# Patient Record
Sex: Female | Born: 1935
Health system: Southern US, Community
[De-identification: ages and names within clinical notes are randomized; demographics above are authoritative.]

## PROBLEM LIST (undated history)

## (undated) DIAGNOSIS — I10 Essential (primary) hypertension: Secondary | ICD-10-CM

## (undated) DIAGNOSIS — K5900 Constipation, unspecified: Secondary | ICD-10-CM

## (undated) DIAGNOSIS — R519 Headache, unspecified: Secondary | ICD-10-CM

## (undated) DIAGNOSIS — I509 Heart failure, unspecified: Secondary | ICD-10-CM

## (undated) DIAGNOSIS — J45909 Unspecified asthma, uncomplicated: Secondary | ICD-10-CM

## (undated) DIAGNOSIS — G8929 Other chronic pain: Secondary | ICD-10-CM

## (undated) DIAGNOSIS — R51 Headache: Secondary | ICD-10-CM

## (undated) DIAGNOSIS — K649 Unspecified hemorrhoids: Secondary | ICD-10-CM

## (undated) DIAGNOSIS — M1990 Unspecified osteoarthritis, unspecified site: Secondary | ICD-10-CM

## (undated) DIAGNOSIS — E119 Type 2 diabetes mellitus without complications: Secondary | ICD-10-CM

## (undated) HISTORY — DX: Unspecified asthma, uncomplicated: J45.909

## (undated) HISTORY — PX: ABDOMINAL HYSTERECTOMY: SHX81

## (undated) HISTORY — PX: CHOLECYSTECTOMY: SHX55

## (undated) HISTORY — PX: BACK SURGERY: SHX140

## (undated) HISTORY — PX: KNEE SURGERY: SHX244

---

## 1998-12-11 ENCOUNTER — Encounter: Payer: Self-pay | Admitting: Orthopedic Surgery

## 1998-12-13 ENCOUNTER — Inpatient Hospital Stay (HOSPITAL_COMMUNITY): Admission: RE | Admit: 1998-12-13 | Discharge: 1998-12-15 | Payer: Self-pay | Admitting: Orthopedic Surgery

## 1998-12-15 ENCOUNTER — Inpatient Hospital Stay (HOSPITAL_COMMUNITY)
Admission: RE | Admit: 1998-12-15 | Discharge: 1999-01-01 | Payer: Self-pay | Admitting: Physical Medicine & Rehabilitation

## 1999-11-01 ENCOUNTER — Encounter: Admission: RE | Admit: 1999-11-01 | Discharge: 1999-11-01 | Payer: Self-pay | Admitting: Endocrinology

## 1999-11-01 ENCOUNTER — Encounter: Payer: Self-pay | Admitting: Endocrinology

## 2001-05-27 ENCOUNTER — Encounter: Payer: Self-pay | Admitting: Endocrinology

## 2001-05-27 ENCOUNTER — Encounter: Admission: RE | Admit: 2001-05-27 | Discharge: 2001-05-27 | Payer: Self-pay | Admitting: Endocrinology

## 2001-07-15 ENCOUNTER — Encounter: Payer: Self-pay | Admitting: Emergency Medicine

## 2001-07-15 ENCOUNTER — Emergency Department (HOSPITAL_COMMUNITY): Admission: EM | Admit: 2001-07-15 | Discharge: 2001-07-15 | Payer: Self-pay | Admitting: Emergency Medicine

## 2001-07-21 ENCOUNTER — Encounter (INDEPENDENT_AMBULATORY_CARE_PROVIDER_SITE_OTHER): Payer: Self-pay | Admitting: *Deleted

## 2001-07-21 ENCOUNTER — Inpatient Hospital Stay (HOSPITAL_COMMUNITY): Admission: EM | Admit: 2001-07-21 | Discharge: 2001-07-28 | Payer: Self-pay | Admitting: Emergency Medicine

## 2001-07-21 ENCOUNTER — Encounter: Payer: Self-pay | Admitting: Endocrinology

## 2001-07-21 ENCOUNTER — Encounter: Payer: Self-pay | Admitting: Emergency Medicine

## 2001-07-23 ENCOUNTER — Encounter: Payer: Self-pay | Admitting: General Surgery

## 2001-09-04 ENCOUNTER — Encounter: Payer: Self-pay | Admitting: Gastroenterology

## 2001-09-04 ENCOUNTER — Encounter (INDEPENDENT_AMBULATORY_CARE_PROVIDER_SITE_OTHER): Payer: Self-pay

## 2001-09-04 ENCOUNTER — Ambulatory Visit (HOSPITAL_COMMUNITY): Admission: RE | Admit: 2001-09-04 | Discharge: 2001-09-04 | Payer: Self-pay | Admitting: Gastroenterology

## 2001-09-10 ENCOUNTER — Ambulatory Visit (HOSPITAL_COMMUNITY): Admission: RE | Admit: 2001-09-10 | Discharge: 2001-09-10 | Payer: Self-pay | Admitting: Endocrinology

## 2001-09-10 ENCOUNTER — Encounter: Payer: Self-pay | Admitting: Endocrinology

## 2001-12-09 ENCOUNTER — Emergency Department (HOSPITAL_COMMUNITY): Admission: EM | Admit: 2001-12-09 | Discharge: 2001-12-09 | Payer: Self-pay | Admitting: Emergency Medicine

## 2001-12-09 ENCOUNTER — Encounter: Payer: Self-pay | Admitting: Emergency Medicine

## 2002-04-04 ENCOUNTER — Inpatient Hospital Stay (HOSPITAL_COMMUNITY): Admission: EM | Admit: 2002-04-04 | Discharge: 2002-04-06 | Payer: Self-pay | Admitting: *Deleted

## 2002-11-15 ENCOUNTER — Encounter: Payer: Self-pay | Admitting: Emergency Medicine

## 2002-11-15 ENCOUNTER — Emergency Department (HOSPITAL_COMMUNITY): Admission: EM | Admit: 2002-11-15 | Discharge: 2002-11-15 | Payer: Self-pay | Admitting: Emergency Medicine

## 2003-06-08 ENCOUNTER — Encounter: Admission: RE | Admit: 2003-06-08 | Discharge: 2003-06-08 | Payer: Self-pay | Admitting: Endocrinology

## 2004-07-04 ENCOUNTER — Encounter: Admission: RE | Admit: 2004-07-04 | Discharge: 2004-07-04 | Payer: Self-pay | Admitting: Endocrinology

## 2004-12-23 ENCOUNTER — Emergency Department (HOSPITAL_COMMUNITY): Admission: EM | Admit: 2004-12-23 | Discharge: 2004-12-23 | Payer: Self-pay | Admitting: Emergency Medicine

## 2005-06-19 ENCOUNTER — Inpatient Hospital Stay (HOSPITAL_COMMUNITY): Admission: EM | Admit: 2005-06-19 | Discharge: 2005-06-21 | Payer: Self-pay | Admitting: Emergency Medicine

## 2005-07-31 ENCOUNTER — Encounter: Admission: RE | Admit: 2005-07-31 | Discharge: 2005-07-31 | Payer: Self-pay | Admitting: Endocrinology

## 2005-08-14 ENCOUNTER — Encounter: Admission: RE | Admit: 2005-08-14 | Discharge: 2005-08-14 | Payer: Self-pay | Admitting: Endocrinology

## 2005-10-31 ENCOUNTER — Emergency Department (HOSPITAL_COMMUNITY): Admission: EM | Admit: 2005-10-31 | Discharge: 2005-10-31 | Payer: Self-pay | Admitting: Emergency Medicine

## 2006-01-01 ENCOUNTER — Emergency Department (HOSPITAL_COMMUNITY): Admission: EM | Admit: 2006-01-01 | Discharge: 2006-01-02 | Payer: Self-pay | Admitting: Emergency Medicine

## 2006-02-26 ENCOUNTER — Encounter: Admission: RE | Admit: 2006-02-26 | Discharge: 2006-02-26 | Payer: Self-pay | Admitting: Endocrinology

## 2006-03-14 ENCOUNTER — Emergency Department (HOSPITAL_COMMUNITY): Admission: EM | Admit: 2006-03-14 | Discharge: 2006-03-14 | Payer: Self-pay | Admitting: Emergency Medicine

## 2006-08-04 ENCOUNTER — Encounter: Admission: RE | Admit: 2006-08-04 | Discharge: 2006-08-04 | Payer: Self-pay | Admitting: Endocrinology

## 2006-11-02 ENCOUNTER — Emergency Department (HOSPITAL_COMMUNITY): Admission: EM | Admit: 2006-11-02 | Discharge: 2006-11-02 | Payer: Self-pay | Admitting: Emergency Medicine

## 2007-08-05 ENCOUNTER — Encounter: Admission: RE | Admit: 2007-08-05 | Discharge: 2007-08-05 | Payer: Self-pay | Admitting: Endocrinology

## 2007-10-20 ENCOUNTER — Encounter: Admission: RE | Admit: 2007-10-20 | Discharge: 2007-10-20 | Payer: Self-pay | Admitting: Orthopedic Surgery

## 2008-01-21 ENCOUNTER — Encounter: Admission: RE | Admit: 2008-01-21 | Discharge: 2008-01-21 | Payer: Self-pay | Admitting: Endocrinology

## 2009-01-11 ENCOUNTER — Encounter: Admission: RE | Admit: 2009-01-11 | Discharge: 2009-01-11 | Payer: Self-pay | Admitting: Endocrinology

## 2009-02-16 ENCOUNTER — Emergency Department (HOSPITAL_COMMUNITY): Admission: EM | Admit: 2009-02-16 | Discharge: 2009-02-16 | Payer: Self-pay | Admitting: Emergency Medicine

## 2010-01-19 ENCOUNTER — Encounter: Admission: RE | Admit: 2010-01-19 | Discharge: 2010-01-19 | Payer: Self-pay | Admitting: Endocrinology

## 2010-03-12 ENCOUNTER — Emergency Department (HOSPITAL_COMMUNITY): Admission: EM | Admit: 2010-03-12 | Discharge: 2010-03-12 | Payer: Self-pay | Admitting: Emergency Medicine

## 2010-03-14 ENCOUNTER — Ambulatory Visit: Payer: Self-pay | Admitting: Internal Medicine

## 2010-03-14 ENCOUNTER — Inpatient Hospital Stay (HOSPITAL_COMMUNITY)
Admission: EM | Admit: 2010-03-14 | Discharge: 2010-03-20 | Payer: Self-pay | Source: Home / Self Care | Attending: Internal Medicine | Admitting: Internal Medicine

## 2010-04-02 ENCOUNTER — Ambulatory Visit (HOSPITAL_COMMUNITY): Admission: RE | Admit: 2010-04-02 | Payer: Self-pay | Source: Home / Self Care | Admitting: Internal Medicine

## 2010-04-02 ENCOUNTER — Ambulatory Visit: Payer: Self-pay

## 2010-04-05 DIAGNOSIS — I10 Essential (primary) hypertension: Secondary | ICD-10-CM

## 2010-04-05 DIAGNOSIS — I5032 Chronic diastolic (congestive) heart failure: Secondary | ICD-10-CM

## 2010-04-12 ENCOUNTER — Ambulatory Visit: Payer: Self-pay | Admitting: Internal Medicine

## 2010-06-25 LAB — GLUCOSE, CAPILLARY
Glucose-Capillary: 100 mg/dL — ABNORMAL HIGH (ref 70–99)
Glucose-Capillary: 102 mg/dL — ABNORMAL HIGH (ref 70–99)
Glucose-Capillary: 119 mg/dL — ABNORMAL HIGH (ref 70–99)
Glucose-Capillary: 120 mg/dL — ABNORMAL HIGH (ref 70–99)
Glucose-Capillary: 124 mg/dL — ABNORMAL HIGH (ref 70–99)
Glucose-Capillary: 131 mg/dL — ABNORMAL HIGH (ref 70–99)
Glucose-Capillary: 138 mg/dL — ABNORMAL HIGH (ref 70–99)
Glucose-Capillary: 140 mg/dL — ABNORMAL HIGH (ref 70–99)
Glucose-Capillary: 145 mg/dL — ABNORMAL HIGH (ref 70–99)
Glucose-Capillary: 153 mg/dL — ABNORMAL HIGH (ref 70–99)

## 2010-06-25 LAB — BASIC METABOLIC PANEL
BUN: 10 mg/dL (ref 6–23)
BUN: 7 mg/dL (ref 6–23)
BUN: 8 mg/dL (ref 6–23)
CO2: 28 mEq/L (ref 19–32)
Chloride: 96 mEq/L (ref 96–112)
Creatinine, Ser: 0.74 mg/dL (ref 0.4–1.2)
GFR calc Af Amer: >60 mL/min (ref >60)
GFR calc non Af Amer: >60 mL/min (ref >60)
GFR calc non Af Amer: >60 mL/min (ref >60)
Glucose, Bld: 150 mg/dL — ABNORMAL HIGH (ref 70–99)
Potassium: 3.5 mEq/L (ref 3.5–5.1)
Potassium: 3.7 mEq/L (ref 3.5–5.1)
Sodium: 139 mEq/L (ref 135–145)

## 2010-06-25 LAB — CBC
HCT: 29.6 % — ABNORMAL LOW (ref 36.0–46.0)
HCT: 30.7 % — ABNORMAL LOW (ref 36.0–46.0)
MCHC: 32.2 g/dL (ref 30.0–36.0)
MCV: 88.2 fL (ref 78.0–100.0)
RDW: 14.7 % (ref 11.5–15.5)
RDW: 14.9 % (ref 11.5–15.5)
WBC: 7.6 10*3/uL (ref 4.0–10.5)
WBC: 9.7 10*3/uL (ref 4.0–10.5)

## 2010-06-26 LAB — URINE CULTURE
Colony Count: NO GROWTH
Culture  Setup Time: 201111302323
Culture: NO GROWTH

## 2010-06-26 LAB — BASIC METABOLIC PANEL
BUN: 10 mg/dL (ref 6–23)
BUN: 11 mg/dL (ref 6–23)
CO2: 26 mEq/L (ref 19–32)
Calcium: 9.4 mg/dL (ref 8.4–10.5)
Chloride: 102 mEq/L (ref 96–112)
Creatinine, Ser: 0.81 mg/dL (ref 0.4–1.2)
Creatinine, Ser: 0.86 mg/dL (ref 0.4–1.2)
GFR calc Af Amer: >60 mL/min (ref >60)
Glucose, Bld: 98 mg/dL (ref 70–99)

## 2010-06-26 LAB — CBC
HCT: 37.2 % (ref 36.0–46.0)
MCH: 28.1 pg (ref 26.0–34.0)
MCH: 28.4 pg (ref 26.0–34.0)
MCHC: 32 g/dL (ref 30.0–36.0)
MCV: 88.8 fL (ref 78.0–100.0)
MCV: 89 fL (ref 78.0–100.0)
Platelets: 183 10*3/uL (ref 150–400)
Platelets: 206 10*3/uL (ref 150–400)
RBC: 4.09 MIL/uL (ref 3.87–5.11)
RDW: 14.4 % (ref 11.5–15.5)
RDW: 14.6 % (ref 11.5–15.5)
WBC: 6.4 10*3/uL (ref 4.0–10.5)

## 2010-06-26 LAB — DIFFERENTIAL
Basophils Absolute: 0 10*3/uL (ref 0.0–0.1)
Basophils Absolute: 0 10*3/uL (ref 0.0–0.1)
Eosinophils Absolute: 0.3 10*3/uL (ref 0.0–0.7)
Eosinophils Relative: 4 % (ref 0–5)
Eosinophils Relative: 4 % (ref 0–5)
Lymphs Abs: 1.8 10*3/uL (ref 0.7–4.0)
Monocytes Absolute: 0.4 10*3/uL (ref 0.1–1.0)
Monocytes Absolute: 0.5 10*3/uL (ref 0.1–1.0)
Neutrophils Relative %: 61 % (ref 43–77)

## 2010-06-26 LAB — URINALYSIS, ROUTINE W REFLEX MICROSCOPIC
Bilirubin Urine: NEGATIVE
Bilirubin Urine: NEGATIVE
Glucose, UA: NEGATIVE mg/dL
Glucose, UA: NEGATIVE mg/dL
Hgb urine dipstick: NEGATIVE
Hgb urine dipstick: NEGATIVE
Ketones, ur: NEGATIVE mg/dL
Ketones, ur: NEGATIVE mg/dL
Protein, ur: NEGATIVE mg/dL
Protein, ur: NEGATIVE mg/dL
pH: 6 (ref 5.0–8.0)

## 2010-06-26 LAB — GLUCOSE, CAPILLARY: Glucose-Capillary: 108 mg/dL — ABNORMAL HIGH (ref 70–99)

## 2010-07-18 LAB — LIPASE, BLOOD: Lipase: 21 U/L (ref 11–59)

## 2010-07-18 LAB — COMPREHENSIVE METABOLIC PANEL
ALT: 11 U/L (ref 0–35)
Albumin: 3.4 g/dL — ABNORMAL LOW (ref 3.5–5.2)
Alkaline Phosphatase: 62 U/L (ref 39–117)
Calcium: 9.1 mg/dL (ref 8.4–10.5)
GFR calc Af Amer: >60 mL/min (ref >60)
Glucose, Bld: 142 mg/dL — ABNORMAL HIGH (ref 70–99)
Potassium: 4.3 mEq/L (ref 3.5–5.1)
Sodium: 136 mEq/L (ref 135–145)
Total Protein: 7 g/dL (ref 6.0–8.3)

## 2010-07-18 LAB — DIFFERENTIAL
Basophils Absolute: 0 K/uL (ref 0.0–0.1)
Basophils Relative: 0 % (ref 0–1)
Eosinophils Absolute: 0 K/uL (ref 0.0–0.7)
Eosinophils Relative: 0 % (ref 0–5)
Lymphocytes Relative: 9 % — ABNORMAL LOW (ref 12–46)
Lymphs Abs: 0.9 K/uL (ref 0.7–4.0)
Monocytes Absolute: 0.3 10*3/uL (ref 0.1–1.0)
Monocytes Relative: 3 % (ref 3–12)
Neutro Abs: 9.2 10*3/uL — ABNORMAL HIGH (ref 1.7–7.7)
Neutrophils Relative %: 88 % — ABNORMAL HIGH (ref 43–77)

## 2010-07-18 LAB — COMPREHENSIVE METABOLIC PANEL WITH GFR
AST: 19 U/L (ref 0–37)
BUN: 11 mg/dL (ref 6–23)
CO2: 26 meq/L (ref 19–32)
Chloride: 103 meq/L (ref 96–112)
Creatinine, Ser: 0.73 mg/dL (ref 0.4–1.2)
GFR calc non Af Amer: >60 mL/min (ref >60)
Total Bilirubin: 0.4 mg/dL (ref 0.3–1.2)

## 2010-07-18 LAB — CBC
HCT: 35.7 % — ABNORMAL LOW (ref 36.0–46.0)
Hemoglobin: 11.9 g/dL — ABNORMAL LOW (ref 12.0–15.0)
MCHC: 33.5 g/dL (ref 30.0–36.0)
MCV: 88.5 fL (ref 78.0–100.0)
Platelets: 146 10*3/uL — ABNORMAL LOW (ref 150–400)
RBC: 4.03 MIL/uL (ref 3.87–5.11)
RDW: 14.9 % (ref 11.5–15.5)
WBC: 10.4 K/uL (ref 4.0–10.5)

## 2010-07-18 LAB — HEMOCCULT GUIAC POC 1CARD (OFFICE): Fecal Occult Bld: POSITIVE

## 2010-07-18 LAB — POCT CARDIAC MARKERS
CKMB, poc: <1 ng/mL — ABNORMAL LOW (ref 1.0–8.0)
Myoglobin, poc: 98.1 ng/mL (ref 12–200)
Troponin i, poc: <0.05 ng/mL (ref 0.00–0.09)

## 2010-08-31 NOTE — H&P (Signed)
[facility]  Patient:    Angelica Benson, Angelica Benson Visit Number: [id number] MRN: [id number]          Service Type: EMS Location: [place] Attending Physician:  Devoria Albe Dictated by:   Veverly Fells. Altheimer, M.D. Admit Date:  07/15/2001 Discharge Date: 07/15/2001                           History and Physical  REASON FOR ADMISSION:  Acute pancreatitis.  HISTORY:  This is a 77 year old black female with history of severe obesity, type 2 diabetes and hypertension.  She presents with acute abdominal pain and findings consistent with pancreatitis and gallstone.  She had an episode of right lower chest pain with a pleuritic component six days ago, at which time she was seen in the [facility] Emergency Room and found to have right lower lobe infiltrate versus atelectasis on chest x-ray; she also had a slight cough.  She was treated as pneumonia with Zithromax.  Her pain quickly subsided.  She was seen in my office yesterday with no significant recurrent or residual symptoms.  At about 10:30 p.m. yesterday, she had sudden onset of severe epigastric pain radiating to the back associated with nausea and some vomiting.  She called EMS.  In the emergency room, she was found to have elevated amylase and lipase and liver enzymes and a gallstone by ultrasound, with common bile duct normal.  The pain was partly relieved with a GI cocktail and was further relieved with repeated eructation.  There was no further vomiting.  Her pain currently has resolved, although she continues to have a mild residual "soreness."  She has mild fatigue and notes decreased appetite for about two weeks.  She denies fever or chills.  She has not had any significant weight change.  She has not had any stool changes.  Last bowel movement was normal yesterday.  PAST MEDICAL HISTORY: 1. Diabetes mellitus, type 2, with severe obesity, diagnosed in 1992.  She    walks a half-mile most days.  Last  hemoglobin A1c was 6.0% on    June 04, 2001, indicating good control. 2. Mild background diabetic retinopathy, with last ophthalmology exam in    February 2003. 3. Hypertension. 4. Chronic pedal and pretibial edema. 5. History of abnormal EKG with ST depression on August 2001 EKG. 6. Euthyroid goiter. 7. Status post left total knee replacement, August of 2000. 8. Status post TAH/BSO in 1972 for uterine cancer.  DRUG SENSITIVITIES:  MORPHINE.  SUDAFED caused insomnia.  MEDICATIONS: 1. Glynase PresTabs 3 mg one-half tablet daily. 2. Avandia 8 mg daily. 3. Coated aspirin 81 mg daily. 4. Hydrochlorothiazide 25 mg b.i.d. 5. Extra-Strength Tylenol occasionally.  FAMILY HISTORY:  Father died of CHF.  Mother had diabetes and thyroid disease and died of stroke.  One brother died of cancer.  One brother with rheumatoid arthritis.  One other brother.  She has four sisters who are well.  She has had eight children, including one son who died of congenital heart disease.  SOCIAL HISTORY:  She is divorced and lives alone.  She does have some family in the area.  She is a retired Curator.  She has never smoke or consumed alcohol.  REVIEW OF SYSTEMS:  Appetite has been decreased, as above.  Energy level recently some decreased.  Skin is dry without other complaints.  EYES:  As above without complaints.  BREASTS:  No complaints.  CARDIOPULMONARY:  As above.  Denies chest pain or palpitations.  She does have some slight chronic dyspnea on exertion tendency.  GI:  As above; also occasional heartburn relieved with Tums.  No other abdominal pain or vomiting episodes.  No hematochezia or melena.  GENITOURINARY:  No complaints.  GYN:  Denies hot flashes.  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  NEUROLOGIC:  No complaints.  PHYSICAL EXAMINATION:  GENERAL:  Alert, pleasant, cooperative 77 year old black female in no acute distress.  VITAL SIGNS:  Temperature 97.1, blood  pressure 146/42, heart rate 92, respiratory rate 24.  SKIN:  Negative.  HEENT:  Eyes:  Normal externally with fundi not examined.  Oropharynx: Negative.  NECK:  Supple without thyromegaly, with carotid upstrokes normal without bruit.  LUNGS:  Unlabored with a few left basilar crackles.  HEART:  Regular without murmur.  ABDOMEN:  Severely obese, soft, mild subxiphoid tenderness, no mass.  EXTREMITIES:  Brawny pretibial edema 2+.  NEUROLOGIC:  Intact without focal deficit.  LABORATORY AND ACCESSORY DATA:  Lab notable for potassium 3.1, blood sugar 136, BUN 13, creatinine 0.7, amylase 340 (27 to 131), lipase 413 (22 to 51), AFP 158, ALT of 61, alkaline phosphatase 148, total bilirubin 0.6; CK 50, troponin 0.01; calcium 8.9, albumin of 3.2.  EKG:  Possible old anterior MI.  Abdominal ultrasound:  Gallstone with normal common bile duct.  ASSESSMENT: 1. Acute pancreatitis, possibly related to #2. 2. Gallstone with common bile duct normal by ultrasound. 3. Episode of right chest pain six days ago treated as pneumonia with right    lower lobe infiltrate versus atelectasis.  In retrospect, wonder whether    this may have actually been related to the above. 4. Diabetes mellitus, type 2, in good control. 5. Severe obesity. 6. Hypokalemia, diuretic induced.  PLAN:  Will admit and keep her n.p.o. with IV fluid.  Replete potassium. Chest x-ray.  Repeat labs in a.m.  GI consult. Dictated by:   Veverly Fells. Altheimer, M.D. Attending Physician:  Devoria Albe DD:  07/21/01 TD:  07/21/01 Job: [id number]

## 2010-08-31 NOTE — Op Note (Signed)
[facility]  Patient:    Angelica Benson, Angelica Benson Visit Number: [id number] MRN: [id number]          Service Type: MED Location: [phone] Attending Physician:  Altheimer, [place] Dictated by:   Lorne Skeens. Hoxworth, M.D. Proc. Date: 07/23/01 Admit Date:  07/21/2001                             Operative Report  PREOPERATIVE DIAGNOSIS:  Gallstone pancreatitis.  POSTOPERATIVE DIAGNOSIS:  Gallstone pancreatitis.  PROCEDURE:  Laparoscopic cholecystectomy with intraoperative cholangiogram.  SURGEON:  Sharlet Salina T. Hoxworth, M.D.  ASSISTANT:  Magnus Ivan, R.N.F.A.  BRIEF HISTORY:  Angelica Benson is a 77 year old black female who presented with acute epigastric pain and lab work indicated evidence of acute pancreatitis. Ultrasound shows multiple gallstones.  Her pancreatic enzymes and LFTs have returned to essentially normal, and her pain has resolved.  Laparoscopic cholecystectomy with cholangiogram has been recommended and accepted.  The nature of the procedure, its indications, the risks of bleeding, infection, bile leak, and bile duct injury were discussed and understood.  She is now brought to the operating room for this procedure.  DESCRIPTION OF PROCEDURE:  The patient was brought to the operating room and placed in the supine position on the operating table, and general endotracheal anesthesia was induced.  She received preoperative antibiotics.  The abdomen was sterilely prepped and draped.  PAS were in place.  The trocar sites were infiltrated with local anesthesia.  A 1 cm incision was made above the umbilicus and dissection carried down to the midline fascia, which was exposed, sharply incised for 1 cm, and the peritoneum elevated between hemostats and entered under direct vision.  A mattress suture of 0 Vicryl was placed and the Hasson trocar inserted and pneumoperitoneum established.  Under direct vision a 10 mm trocar was placed in  the subxiphoid area and two 5 mm trocars on the right subcostal margin.  The gallbladder was exposed and appeared mildly chronically inflamed.  The fundus was grasped and elevated anteriorly, and then the infundibulum was retracted inferolaterally.  There were some chronic duodenal adhesions that were taken down sharply, carefully protecting the duodenum.  Fibrofatty tissue was then stripped off the neck of the neck of the gallbladder toward the porta hepatis.  The distal gallbladder was thoroughly dissected and the cystic artery and cystic duct identified. The cystic artery could clearly be seen coursing along the gallbladder wall, was divided between two proximal and one distal clips.  The distal gallbladder was dissected 360 degrees and the cystic duct exposed over about a centimeter. When the anatomy was clear, the cystic duct was clipped at its junction with the gallbladder and an operative cholangiogram obtained in the cystic duct. This showed good filling of the intrahepatic ducts and the common bile duct with free flow into the duodenum and no filling defects.  Following this, the cholangiocatheter was removed and the cystic duct was doubly clipped proximally, clipped distally, and divided.  The gallbladder was then dissected free from its bed using the hook cautery and removed intact.  Hemostasis was obtained in the gallbladder bed with a small patch of Surgicel and cautery and hemostasis assured.  The gallbladder was withdrawn through the umbilicus.  The mattress suture was secured at the umbilicus.  A couple of simple 0 Vicryl sutures were placed with the Endoclose additionally at the umbilical fascial defect.  Trocars were removed under  direct vision and all CO2 evacuated from the peritoneal cavity.  The skin incisions were closed with interrupted subcuticular 4-0 Monocryl and Steri-Strips.  Sponge, needle, and instrument counts were correct.  Dry sterile dressings were applied  and the patient was taken to the recovery room in good condition. Dictated by:   Lorne Skeens. Hoxworth, M.D. Attending Physician:  Brennan Bailey DD:  07/23/01 TD:  07/24/01 Job: [id number]

## 2010-08-31 NOTE — Procedures (Signed)
[facility]  Patient:    Angelica Benson, Angelica Benson Visit Number: [id number] MRN: [id number]          Service Type: END Location: ENDO Attending Physician:  Louie Bun Dictated by:   Everardo All Madilyn Fireman, M.D. Proc. Date: 09/04/01 Admit Date:  09/04/2001 Discharge Date: 09/04/2001   CC:         Sharlet Salina T. Hoxworth, M.D.  Veverly Fells. Altheimer, M.D.   Procedure Report  PROCEDURE:  Colonoscopy with polypectomy.  SURGEON:  John C. Madilyn Fireman, M.D.  INDICATIONS FOR PROCEDURE:  Anemia.  DESCRIPTION OF PROCEDURE:  The patient was placed in the left lateral decubitus position and placed on the pulse monitor with continuous low flow oxygen delivered by nasal cannula.  She was sedated with 30 mg of IV Demerol and 3 mg of IV Versed in addition to the 30 mg of Demerol and 30 mg of Versed given for the previous EGD.  The Olympus video colonoscope was inserted into the rectum and advanced to the cecum, confirmed by transillumination of McBurneys point, and visualization of the ileocecal valve and appendiceal orifice.  The prep was good.  The cecum, ascending, transverse, descending, and sigmoid colon all appeared normal with no masses, polyps, diverticula, or other mucosal abnormalities.  Two 8 mm polyps were seen in the rectum and snared.  One at approximately 16 cm and the other one at approximately 12 cm from the anal verge.  These were sent in the same specimen container. Otherwise, the rectum appeared normal except for some moderately enlarged internal hemorrhoids.  The colonoscope was then withdrawn and the patient returned to the recovery room in stable condition.  She tolerated the procedure well and there were no immediate complications.  IMPRESSION: 1. Two rectal polyps. 2. Small internal hemorrhoids.  PLAN:  Await all biopsy results on colon polyps and polyps removed at the EGD. Dictated by:   Everardo All Madilyn Fireman, M.D. Attending Physician:  Louie Bun DD:  09/04/01 TD:  09/08/01 Job: [id number]

## 2010-08-31 NOTE — Procedures (Signed)
[facility]  Patient:    Angelica Benson, Angelica Benson Visit Number: [id number] MRN: [id number]          Service Type: END Location: ENDO Attending Physician:  Louie Bun Dictated by:   Everardo All Madilyn Fireman, M.D. Admit Date:  09/04/2001 Discharge Date: 09/04/2001   CC:         Veverly Fells. Altheimer, M.D.  Lorne Skeens. Hoxworth, M.D.   Procedure Report  PROCEDURE:  Esophagogastroduodenoscopy with polypectomy.  SURGEON:  John C. Madilyn Fireman, M.D.  INDICATIONS FOR PROCEDURE:  The patient with a recent history of pancreatitis who also has anemia with hemoglobin of 8 and no prior colon or endoscopic imaging.  DESCRIPTION OF PROCEDURE:  The patient was placed in the left lateral decubitus position and placed on the pulse monitor with continuous low flow oxygen delivered by nasal cannula.  She was sedated with 30 mg of IV Demerol and 3 mg of IV Versed.  The Olympus video endoscope was advanced under direct vision into the oropharynx and esophagus.  The esophagus was straight and of normal caliber with the squamocolumnar line at 38 cm.  There was possibly a small sliding hiatal hernia, but no visible esophagitis.  Just distal to the GE junction, there was a small protuberance with a small amount of exudate either representing focal area of gastritis or possibly a polyp.  This lesion was fulgurated by hot biopsy.  The stomach was entered and a small amount of liquid secretions were suctioned from the fundus.  Retroflexed view of the cardia also revealed this lesion and was otherwise unremarkable.  The fundus and body appeared normal.  The antrum showed one prepyloric elevated rounded area that appeared to represent some type of polyp on a very short pedicle, approximately 1.2 cm in diameter.  A smaller adjacent lesion was approximately 8 mm in diameter and was less pedunculated.  In addition, running between the two lesions, there was what appeared to be simply an elevated  granular fold, radiating down to the pylorus which was non-deformed.  The larger of the two polypoid structures were snared.  The other one was not biopsied.  The stomach was entered and a small amount of liquid secretions were suctioned from the fundus.  The duodenum was entered and both the bulb and second portion were well-inspected and appeared to be within normal limits.  The scope was then withdrawn and the patient returned to the recovery room in stable condition. She tolerated the procedure well and there were no immediate complications.  IMPRESSION:  Two apparent antral polyps with one possible gastroesophageal junction polyp versus enlarged fold or focal gastritis.  PLAN:  Await all biopsy results and will proceed with colonoscopy based on her anemia. Dictated by:   Everardo All Madilyn Fireman, M.D. Attending Physician:  Louie Bun DD:  09/04/01 TD:  09/08/01 Job: [id number]

## 2010-08-31 NOTE — H&P (Signed)
Angelica Benson, Angelica Benson                         ACCOUNT NO.:  [id number]   MEDICAL RECORD NO.:  [id number]                   PATIENT TYPE:  INP   LOCATION:  5158                                 FACILITY:  [facility]   PHYSICIAN:  Reather Littler, M.D.                    DATE OF BIRTH:  [birth year]   DATE OF ADMISSION:  04/04/2002  DATE OF DISCHARGE:                                HISTORY & PHYSICAL   CHIEF COMPLAINT:  Vomiting and diarrhea for one day.   HISTORY:  This is a 77 year old, African-American female who was well until  early this morning at 3:00 a.m,. when she started having small amounts of  vomiting and belching.  She was also having diarrhea.  The patient came to  the emergency room by ambulance at about 10:00 a.m. and was treated  symptomatically with fluids and Phenergan; however, the patient continued to  have persistent nausea and vomiting and some diarrhea.  She also had an  episode of shortness of breath and desaturation that was treated  symptomatically.   Because the patient was not improving, she was referred for admission.  She  did not complain of any fever or chills.  She apparently has not had any  unusual foods recently.  She has a little abdominal discomfort in the upper  part at present which she states started after trying to eat some crackers  in the emergency room; however, she had not had any abdominal pain before.  She is not able to keep down any liquids at all and also started having  diarrhea when trying to drink ginger ale.   CURRENT MEDICATIONS:  She is on Albuterol inhalers.  She takes Glynase 3 mg,  half tablet daily.  Avandia 8 mg daily.  Aspirin 81 mg daily.  There are  currently no other medications.  HCTC and Vicodin p.r.n.   ALLERGIES:  Only to morphine.   FAMILY HISTORY:  Positive for diabetes, thyroid disease, stroke, and  rheumatoid arthritis.  Her father died of congestive heart failure.   SOCIAL HISTORY:  She lives alone, she has never  smoke, consumed alcohol.   PAST HISTORY:  She has had a cholecystectomy in April of this year when she  was admitted for pancreatitis.  She also has had hysterectomy.  Apparently  she has had eight children.  She also has had known obesity and history of  chronic pedal edema.   REVIEW OF SYSTEMS:  The patient has had diabetes since about 1992 which is  apparently well controlled.  Her blood sugars are usually about 120 although  she did not check any today.  She has had a history of mild diabetic  retinopathy.  She has had history of hypertension.  There is no previous  history of goiter.  No history of chest pain or pulmonary disease.  No  history of urinary symptoms.  She  has occasional numbness in her feet.   PHYSICAL EXAMINATION:  GENERAL:  The patient is obese, she is currently in  no acute distress.  VITAL SIGNS:  Her pulse is 84, blood pressure is 130/85.  Respirations  appear normal, she is afebrile.  Temperature 98.1 degrees.  HEENT:  Her mucous membranes look dry, she had mild conjunctival pallor.  The skin is slightly dry.  She is alert and cooperative.  She has no  abnormality of her eye exam externally.  Mouth and throat are normal.  NECK:  She has no lymphadenopathy or obvious thyroid enlargement.  She has  prominent right suprasternal pulsation but slight thrill, but no palpable  mass.  HEART:  Sounds are normal with ejection murmur at the apex.  BREAST EXAM:  Not indicated.  LUNGS:  Currently are clear to auscultation.  ABDOMEN:  No distention.  Bowel sounds are somewhat decreased.  She has mild  epigastric tenderness.  Mild hepatosplenomegaly.  RECTAL EXAM:  Not indicated.  EXTREMITIES:  No edema or skin lesions.   LABORATORY DATA:  Currently, the only labs available are a glucose of 129,  potassium 3.1 and the rest of the chemistries are normal.  She is not  acidotic.   ASSESSMENT/PLAN:  The patient has probable viral gastroenteritis with some  dehydration.  She  also has asthma with exacerbation.  Plan is to hydrate  her, replace potassium, will check for pancreatitis and anemia, and also  continue symptomatic treatment for her nausea and diarrhea.  Will start  Protonix IV.  Her blood sugar will be monitored and treatment indicated only  if she gets hyperglycemic.                                               Reather Littler, M.D.    AK/MEDQ  D:  04/04/2002  T:  04/05/2002  Job:  [id number]   cc:   Veverly Fells. Altheimer, M.D.  [address], [address]  [place]  [place] [address]  Fax: [phone]

## 2010-08-31 NOTE — H&P (Signed)
Angelica Benson, Angelica Benson               ACCOUNT NO.:  [id number]   MEDICAL RECORD NO.:  [id number]          PATIENT TYPE:  INP   LOCATION:  3005                         FACILITY:  [facility]   PHYSICIAN:  Veverly Fells. Altheimer, M.D.DATE OF BIRTH:  1928/10/27   DATE OF ADMISSION:  06/19/2005  DATE OF DISCHARGE:                                HISTORY & PHYSICAL   REASON FOR ADMISSION:  Apparent pancreatitis.   This is a 77 year old black female with severe obesity and type 2 diabetes  controlled on oral agents and a history of laparoscopic cholecystectomy in  April 2003 for gallstone pancreatitis.  She is now admitted with apparent  acute pancreatitis.  She had onset of nausea and vomiting (states that her  emesis was bilious) and loose stools with crampy lower abdominal discomfort  at 8 p.m. yesterday.  All of her symptoms essentially resolved in the  emergency room by about 3 a.m. with Zofran and Imodium.  Her lipase was  markedly elevated at 322.   She feels well now except for mild fatigue and mild dry mouth.  Her last  stool was still watery, but very small in quantity.  She feels that her  symptoms overall seem to be resolving.   She recently traveled to [place], [place] of [place] and also notes  that three family members (in-laws) have died in the past two weeks so she  has been under quite a bit of stress.   PAST MEDICAL HISTORY:  1.  Diabetes mellitus type 2 associated with obesity diagnosed in 1992.      Last A1c was 7.1% on oral agents.  2.  Diabetic retinopathy, mild with eye examinations up to date.  3.  Hypertension in good control.  4.  Chronic lower extremity edema.  5.  History of abnormal EKGs.  6.  History of diuretic-induced hypokalemia.  Last potassium was 4.1 in      January.  7.  Chronically elevated, highly sensitive CRP levels in the range of 14-24.  8.  Asthma, mild, quiescent.  9.  Allergic rhinitis.  10. Euthyroid goiter.  11. Osteoarthritis status post  left total knee replacement in August 2000.      She uses a walker to minimize the risk of falls.  12. Status post laparoscopic cholecystectomy for gallstone pancreatitis      April 2003.  13. Status post TAH/BSO in 1972 for uterine cancer.   DRUG SENSITIVITIES:  SUDAFED causes insomnia, AVANDIA increased her lower  extremity edema and was associated with dyspnea, MORPHINE.   MEDICATIONS ON ADMISSION:  1.  Glynase Prestabs 3 mg one-half tablet before breakfast.  2.  Metformin 500 mg b.i.d. with meals.  3.  Hydrochlorothiazide 25 mg daily.  4.  K-Dur 20 mEq daily.  5.  Albuterol inhaler p.r.n. which she only uses about once a week.  6.  Combivent two sprays t.i.d. or q.i.d.  7.  Hydroxyzine 25 mg q.h.s.  8.  Vicodin one b.i.d.  9.  Extra strength Tylenol occasionally.   FAMILY HISTORY:  Mother had diabetes and thyroid disease and died of a  stroke.  Father died of congestive heart failure.  Three brothers including  one who died of cancer and one with rheumatoid arthritis.  Four sisters.  Eight children including one son who died of congenital heart disease.   SOCIAL HISTORY:  She is divorced.  She has previously worked as a Associate Professor.  She currently has home health visits provided by an agency called  [facility].  She has never smoked or consumed alcohol.   REVIEW OF SYSTEMS:  Appetite has been stable and energy level is generally  good.  SKIN:  No complaints.  HEENT:  Eyes:  No complaints.  CARDIOPULMONARY:  She denies chest pain, dyspnea, palpitations, or cough.  GASTROINTESTINAL:  As above.  Occasional heartburn relieved with Tums.  GENITOURINARY:  No complaints except that she is asking for the Foley to be  removed.  MUSCULOSKELETAL:  Osteoarthritis as above.  NEUROLOGIC:  No  specific complaints.   PHYSICAL EXAMINATION:  GENERAL:  She is alert and in no acute distress with  stable vital signs.  She is afebrile.  She is severely obese.  She is  sitting comfortably on  the side of the bed.  VITAL SIGNS:  O2 saturation 96%.  SKIN:  Negative.  HEENT:  Eyes normal externally with fundi not examined.  Oropharynx  negative.  NECK:  Supple with goiter unchanged and carotid upstrokes normal without  bruit.  LUNGS:  Unlabored, clear.  HEART:  Regular without murmur.  ABDOMEN:  Obese, soft, nontender.  EXTREMITIES:  1+ pretibial edema with pedal pulses normal bilaterally.  NEUROLOGIC:  Without focal deficit.   LABORATORIES:  WBC 10.3, hemoglobin 12.8, 88% neutrophils.  Sodium 136,  potassium 3.4, glucose 249, BUN 13, creatinine 0.8, calcium 8.8.  Liver  enzymes normal.  Amylase elevated at 322 (22-51).  Urinalysis is negative.   ASSESSMENT:  1.  Apparent acute pancreatitis in patient with prior laparoscopic      cholecystectomy for gallstone pancreatitis in April 2003.  She presented      with nonspecific gastroenteritis-like presentation but the lipase is      markedly elevated, as noted.  Will continue intravenous at 125 an hour      and replete potassium.  Will advance to clear liquids.  She has had no      further nausea since the initial dose of Zofran, but will continue that      as needed as well as Imodium if needed.  Will use insulin by low dose      Lantus and sliding scale NovoLog.  Will request GI consult if her      episode is not resolving quickly or if she continues to have unexpected      elevation of lipase and/or amylase as the possibility of pancreatitis      from a retained gallstone may need to be considered.  2.  Diabetes mellitus.  3.  Hypokalemia.  4.  Hypertension.  5.  Asthma, quiescent.  6.  Osteoarthritis status post left total knee replacement August of 2000.  7.  Severe obesity.           ______________________________  Veverly Fells Altheimer, M.D.     MDA/MEDQ  D:  06/19/2005  T:  06/19/2005  Job:  [id number]   cc:   Everardo All. Madilyn Fireman, M.D.  Fax: [phone]

## 2010-08-31 NOTE — Consult Note (Signed)
[place]  Patient:    Angelica Benson, Angelica Benson Visit Number: [id number] MRN: [id number]          Service Type: MED Location: [phone] Attending Physician:  Altheimer, [place] Dictated by:   Alvia Grove., M.D. Proc. Date: 07/22/01 Admit Date:  07/21/2001   CC:         Veverly Fells. Altheimer, M.D.  John C. Madilyn Fireman, M.D.  Lorne Skeens. Hoxworth, M.D.   Consultation Report  HISTORY OF PRESENT ILLNESS:  Angelica Benson is a 77 year old female with no prior cardiac history. She does have a history of hypertension and diabetes mellitus. She is admitted with abdominal pain and was found to have gallstone cholecystitis. We were asked to see her for preoperative evaluation.  The patient has not had any episodes of angina. She was diagnosed with pneumonia last week and had some right-sided chest pain. She has not had any episodes of shortness of breath. She denies any syncope or presyncope, PND, or orthopnea. She has been able to do all of her usual daily activities without any significant problems. She was recently admitted with worsening abdominal pain. She was found to have markedly elevated liver enzymes, amylase, and lipase and was found to have cholecystitis.  CURRENT MEDICATIONS:  Hydrochlorothiazide, Glynase, and Avandia.  ALLERGIES:  She has no known drug allergies.  PAST MEDICAL HISTORY: 1. Hypertension. 2. Diabetes mellitus. 3. Obesity.  SOCIAL HISTORY:  The patient is a nonsmoker and a nondrinker.  FAMILY HISTORY:  Her father had a history of an enlarged heart.  REVIEW OF SYSTEMS:  As per the HPI. She denies any problems with her eyes, ears, nose, and throat. She denies any cough or sputum production since last week. She does have some wheezing, especially today. She denies any change in her bowel habits. Her review of systems was reviewed and is otherwise, negative.  PHYSICAL EXAMINATION:  GENERAL:  She is an elderly  female in mild distress. She has wheezing or perhaps pseudo wheezing.  VITAL SIGNS:  Her temperature is 98.2, blood pressure 144/61 with heart rate of 86.  HEENT:  Reveals 2+ carotids. She has no bruits. There is no JVD.  LUNGS:  Clear posteriorly, but she has some wheezing or pseudo wheezing heard at her trachea and somewhat anteriorly.  HEART:  Regular rate, S1, S2.  ABDOMEN:  Mild obesity. Her abdomen is soft and nontender. There are few bowel sounds.  EXTREMITIES:  She has no cyanosis, clubbing, or edema. There is no calf tenderness.  NEUROLOGIC:  Nonfocal.  DIAGNOSTIC DATA:  Her EKG reveals normal sinus rhythm. She has poor R wave progression. This is either due to lead placement or less likely a previous anterior wall myocardial infarction.  LABORATORY DATA:  Her amylase is elevated. Her lipase is elevated. Her liver function enzymes are elevated. Her sodium is 137, potassium 3.1, chloride 102, CO2 is 30, her glucose is 130. Her hematocrit is 25%.  IMPRESSION AND PLAN:  Angelica Benson presents with abdominal pain. She has had some chest pains in the past, but these were fairly atypical. They seem to be more associated with her previous pneumonia. She does have some poor R wave progression on her EKG, but this appears to be more likely due to lead placement. She denies any real cardiac symptoms. Specifically, she denies any angina, shortness of breath, syncope, presyncope, paroxysmal nocturnal dyspnea, or orthopnea. Using the [place] criteria, I think that she is at low risk for  her upcoming surgery. Just to be safe, I would like to get an echocardiogram for further evaluation of her left ventricular function. If her left ventricular function is adequate and if her anterior wall moves well, then I think that she is indeed at low risk for her upcoming surgery. Dictated by:   Alvia Grove., M.D. Attending Physician:  Brennan Bailey DD:  07/22/01 TD:   07/22/01 Job: [id number]

## 2010-08-31 NOTE — Discharge Summary (Signed)
NAMEPERIAN, Benson               ACCOUNT NO.:  [id number]   MEDICAL RECORD NO.:  [id number]          PATIENT TYPE:  INP   LOCATION:  3005                         FACILITY:  [facility]   PHYSICIAN:  Veverly Fells. Altheimer, M.D.DATE OF BIRTH:  05/02/1928   DATE OF ADMISSION:  06/19/2005  DATE OF DISCHARGE:  06/21/2005                                 DISCHARGE SUMMARY   REASON FOR ADMISSION:  Possible pancreatitis.   HISTORY:  This is a 77 year old black female with severe obesity and type 2  diabetes, controlled on oral agents, and a history of laparoscopic  cholecystectomy, April 2003, for gallstone pancreatitis.  She was admitted  with history since 8 p.m. on the day prior to admission of nausea and  vomiting and loose stools with crampy lower abdominal discomfort.  Her  lipase was markedly elevated at 322 in the emergency room.  Her symptoms had  essentially resolved in the emergency room at about 3 a.m. after receiving  Zofran and Imodium.  She continued to have some fatigue and mild dry mouth  and residual watery stool at the time of admission.  She had recently  traveled to [place], [place] and noted 3 family members had died in the past 2  weeks, so she had been under quite a bit of stress.   PAST MEDICAL HISTORY:  See admission note.   DRUG SENSITIVITY:  See admission note.   MEDICATIONS ON ADMISSION:  See admission note.   PHYSICAL EXAMINATION:  GENERAL/VITAL SIGNS:  On admission, she is afebrile,  severely obese, sitting comfortably on the side of the bed with stable vital  signs.  ABDOMEN:  Obese, soft and nontender.   LABORATORY DATA:  Lab on admission was notable for WBC 10.8 with 88%  neutrophils, hemoglobin 12.8, potassium 3.4, glucose 249, liver enzymes  normal, lipase elevated at 322 with normal 22-51.  Urinalysis was negative.   HOSPITAL COURSE:  She was admitted due to the question of whether she might  have acute pancreatitis, perhaps from a retained gallstone.   She was  rehydrated and potassium was repleted.  She was advanced to clear liquids.  She did not need any further antiemetics.  She was treated with low-dose  insulin.  By June 20, 2005, she was feeling much better with GI symptoms  resolved.  She remained afebrile.  Repeat lipase levels were normal twice  and amylase level was normal.  It was thus suspected that the initial lipase  value may have been spurious.  Her illness was thought probably to represent  viral gastroenteritis rather than pancreatitis.  Sodium was 132 and she was  changed to normal saline at 50 per hour.  Potassium was up to 3.5 on June 20, 2005 and potassium repletion was continued.  By June 21, 2005, she felt  fine, was tolerating solid food and was ready to go home.  Potassium was 3.9  and sodium 141.   CONDITION ON DISCHARGE:  Improved.   FINAL DIAGNOSES:  1.  Probable acute viral gastroenteritis.  2.  Lipase apparently falsely elevated on admission.  3.  Status  post laparoscopic cholecystectomy for gallstone pancreatitis,      April 2003.  4.  Diabetes mellitus, type 2, associated with obesity, diagnosed in 1992.  5.  Hypokalemia, resolved.  6.  Hyponatremia, resolved.  7.  Diabetic retinopathy.  8.  Hypertension.  9.  Chronic lower extremity edema.  10. History of mild asthma and allergic rhinitis.  11. Euthyroid goiter.  12. Osteoarthritis, status post left total knee replacement, August 2000.   DISCHARGE PLANS:  She was to continue a diabetic diet with activity as  tolerated.   MEDICATIONS:  1.  Glynase 3 mg one-half tablet daily.  2.  Metformin 500 mg b.i.d. to be resumed the day subsequent to discharge.  3.  Hydrochlorothiazide 25 mg daily to be resumed the day subsequent to      discharge.  4.  K-Dur 20 mEq daily.  5.  Combivent two sprays three to four times a day.  6.  Albuterol inhaler two sprays four times daily p.r.n.  7.  Hydroxyzine 25 mg nightly.  8.  Vicodin one b.i.d. p.r.n.  osteoarthritis pain.   DISCHARGE INSTRUCTIONS:  She is to continue monitoring her blood sugars at  least twice daily.   FOLLOWUP:  She is to keep appointment with Veverly Fells. Altheimer, M.D. on  August 07, 2005 and will call sooner if she has any problems.           ______________________________  Veverly Fells Altheimer, M.D.     MDA/MEDQ  D:  08/04/2005  T:  08/06/2005  Job:  [id number]

## 2010-08-31 NOTE — Discharge Summary (Signed)
Angelica Benson, Angelica Benson                         ACCOUNT NO.:  [id number]   MEDICAL RECORD NO.:  [id number]                   PATIENT TYPE:  INP   LOCATION:  5158                                 FACILITY:  [facility]   PHYSICIAN:  Reather Littler, M.D.                    DATE OF BIRTH:  [birth year]   DATE OF ADMISSION:  04/04/2002  DATE OF DISCHARGE:  04/06/2002                                 DISCHARGE SUMMARY   HISTORY:  The patient was admitted with vomiting and diarrhea for one day.  This was continuous and the patient was unable to keep down any liquids.  Treatment in the emergency room was unsuccessful and she was having mild  epigastric discomfort; she was also having difficulty with her asthma in the  ER.   MEDICATIONS:  Home medications were Glynase, Avandia, aspirin, potassium,  hydrochlorothiazide, Vicodin and iron as well as albuterol.   FAMILY HISTORY/SOCIAL HISTORY/PAST HISTORY/REVIEW OF SYSTEMS:  See HPI.   PHYSICAL EXAMINATION:  Physical examination significant only for:  Pulse of  84, blood pressure 130/85, respirations normal at the time of exam,  temperature 98.1.  Mucous membranes were dry.  She looked somewhat pale and  the skin was slightly dry.  She had a prominent suprasternal pulsation,  ejection murmur at the apex and occasional loud S1 sound.  Abdomen showed  decreased bowel sounds with epigastric tenderness and no hepatosplenomegaly.  Rest of the exam was normal.   HOSPITAL COURSE:  This patient was hydrated and given potassium in the IV.  Nausea and diarrhea were controlled symptomatically and the patient improved  and was started on diet.  On April 05, 2002, her potassium fell to 2.7;  she was given boluses of potassium.  The next day, the patient was feeling  well and eating and was ready for discharge.   LABORATORY DATA:  Potassium on April 04, 2002 -- 3.1, repeat 2.7 and on  April 06, 2002, it was 4.1.  Hemoglobin 10.6.  Glucose 136, albumin 2.7,  amylase normal.   DISCHARGE DIAGNOSES:  1. Acute viral gastroenterology with dehydration.  2. Asthma.  3. History of hypertension.  4. Hypokalemia.   DISCHARGE CONDITION:  Improved.   DISCHARGE MEDICATIONS:  1. Glynase a half tablet daily.  2. Avandia one daily.  3. Iron once a day.  4.     Aspirin once a day.  5. Combivent inhaler four times a day as needed.   SPECIAL DISCHARGE INSTRUCTIONS:  Do not take hydrochlorothiazide.                                               Reather Littler, M.D.    AK/MEDQ  D:  04/06/2002  T:  04/07/2002  Job:  [id number]   cc:  Veverly Fells. Altheimer, M.D.  [address], [address]  [place]  [place] [address]  Fax: [phone]

## 2010-10-31 ENCOUNTER — Encounter: Payer: Self-pay | Admitting: Podiatry

## 2010-10-31 DIAGNOSIS — E119 Type 2 diabetes mellitus without complications: Secondary | ICD-10-CM | POA: Insufficient documentation

## 2011-01-02 ENCOUNTER — Other Ambulatory Visit: Payer: Self-pay | Admitting: Endocrinology

## 2011-01-02 DIAGNOSIS — Z1231 Encounter for screening mammogram for malignant neoplasm of breast: Secondary | ICD-10-CM

## 2011-01-23 ENCOUNTER — Ambulatory Visit: Payer: Self-pay

## 2011-01-28 LAB — B-NATRIURETIC PEPTIDE (CONVERTED LAB): Pro B Natriuretic peptide (BNP): 54

## 2011-01-28 LAB — I-STAT 8, (EC8 V) (CONVERTED LAB)
Acid-Base Excess: 5 — ABNORMAL HIGH
Chloride: 105
HCT: 39
Hemoglobin: 13.3
Potassium: 4.8
Sodium: 136
pH, Ven: 7.399 — ABNORMAL HIGH

## 2011-01-28 LAB — POCT I-STAT CREATININE: Operator id: 291361

## 2011-01-28 LAB — POCT CARDIAC MARKERS
Myoglobin, poc: 42.9
Troponin i, poc: 0.05

## 2011-02-11 ENCOUNTER — Ambulatory Visit
Admission: RE | Admit: 2011-02-11 | Discharge: 2011-02-11 | Disposition: A | Discharge status: Home / Self Care | Payer: Medicare Other | Source: Ambulatory Visit | Attending: Endocrinology | Admitting: Endocrinology

## 2011-02-11 DIAGNOSIS — Z1231 Encounter for screening mammogram for malignant neoplasm of breast: Secondary | ICD-10-CM

## 2012-01-28 ENCOUNTER — Other Ambulatory Visit: Payer: Self-pay | Admitting: Endocrinology

## 2012-01-28 DIAGNOSIS — Z1231 Encounter for screening mammogram for malignant neoplasm of breast: Secondary | ICD-10-CM

## 2012-02-21 ENCOUNTER — Ambulatory Visit: Payer: Medicare Other

## 2012-02-26 ENCOUNTER — Ambulatory Visit
Admission: RE | Admit: 2012-02-26 | Discharge: 2012-02-26 | Disposition: A | Discharge status: Home / Self Care | Payer: Medicare Other | Source: Ambulatory Visit | Attending: Endocrinology | Admitting: Endocrinology

## 2012-02-26 DIAGNOSIS — Z1231 Encounter for screening mammogram for malignant neoplasm of breast: Secondary | ICD-10-CM

## 2013-01-04 ENCOUNTER — Emergency Department (HOSPITAL_COMMUNITY)
Admission: EM | Admit: 2013-01-04 | Discharge: 2013-01-04 | Disposition: A | Discharge status: Home / Self Care | Payer: PRIVATE HEALTH INSURANCE | Attending: Emergency Medicine | Admitting: Emergency Medicine

## 2013-01-04 ENCOUNTER — Emergency Department (HOSPITAL_COMMUNITY): Payer: PRIVATE HEALTH INSURANCE

## 2013-01-04 ENCOUNTER — Encounter (HOSPITAL_COMMUNITY): Payer: Self-pay | Admitting: Emergency Medicine

## 2013-01-04 DIAGNOSIS — Z79899 Other long term (current) drug therapy: Secondary | ICD-10-CM | POA: Insufficient documentation

## 2013-01-04 DIAGNOSIS — H538 Other visual disturbances: Secondary | ICD-10-CM | POA: Insufficient documentation

## 2013-01-04 DIAGNOSIS — Z7982 Long term (current) use of aspirin: Secondary | ICD-10-CM | POA: Insufficient documentation

## 2013-01-04 DIAGNOSIS — R51 Headache: Secondary | ICD-10-CM | POA: Insufficient documentation

## 2013-01-04 DIAGNOSIS — E119 Type 2 diabetes mellitus without complications: Secondary | ICD-10-CM | POA: Insufficient documentation

## 2013-01-04 DIAGNOSIS — R42 Dizziness and giddiness: Secondary | ICD-10-CM | POA: Insufficient documentation

## 2013-01-04 DIAGNOSIS — J45909 Unspecified asthma, uncomplicated: Secondary | ICD-10-CM | POA: Insufficient documentation

## 2013-01-04 DIAGNOSIS — H9190 Unspecified hearing loss, unspecified ear: Secondary | ICD-10-CM | POA: Insufficient documentation

## 2013-01-04 DIAGNOSIS — M542 Cervicalgia: Secondary | ICD-10-CM | POA: Insufficient documentation

## 2013-01-04 DIAGNOSIS — I10 Essential (primary) hypertension: Secondary | ICD-10-CM | POA: Insufficient documentation

## 2013-01-04 DIAGNOSIS — Z885 Allergy status to narcotic agent status: Secondary | ICD-10-CM | POA: Insufficient documentation

## 2013-01-04 LAB — CBC WITH DIFFERENTIAL/PLATELET
Eosinophils Absolute: 0.4 10*3/uL (ref 0.0–0.7)
Eosinophils Relative: 5 % (ref 0–5)
HCT: 33.4 % — ABNORMAL LOW (ref 36.0–46.0)
Hemoglobin: 11.2 g/dL — ABNORMAL LOW (ref 12.0–15.0)
Lymphocytes Relative: 43 % (ref 12–46)
Lymphs Abs: 3.5 10*3/uL (ref 0.7–4.0)
MCH: 28.7 pg (ref 26.0–34.0)
MCV: 85.6 fL (ref 78.0–100.0)
Monocytes Absolute: 0.8 10*3/uL (ref 0.1–1.0)
Monocytes Relative: 10 % (ref 3–12)
RBC: 3.9 MIL/uL (ref 3.87–5.11)

## 2013-01-04 LAB — ETHANOL: Alcohol, Ethyl (B): <11 mg/dL (ref 0–11)

## 2013-01-04 LAB — COMPREHENSIVE METABOLIC PANEL
Alkaline Phosphatase: 72 U/L (ref 39–117)
BUN: 15 mg/dL (ref 6–23)
CO2: 26 mEq/L (ref 19–32)
Calcium: 9.1 mg/dL (ref 8.4–10.5)
GFR calc Af Amer: 77 mL/min — ABNORMAL LOW (ref >90)
GFR calc non Af Amer: 67 mL/min — ABNORMAL LOW (ref >90)
Glucose, Bld: 103 mg/dL — ABNORMAL HIGH (ref 70–99)
Total Protein: 7.4 g/dL (ref 6.0–8.3)

## 2013-01-04 LAB — PROTIME-INR: INR: 1.07 (ref 0.00–1.49)

## 2013-01-04 LAB — APTT: aPTT: 30 seconds (ref 24–37)

## 2013-01-04 MED ORDER — ACETAMINOPHEN 325 MG PO TABS
650.0000 mg | ORAL_TABLET | Freq: Once | ORAL | Status: AC
  Administered 2013-01-04: 650 mg via ORAL
  Filled 2013-01-04: qty 2

## 2013-01-04 NOTE — ED Provider Notes (Signed)
CSN: [id number]     Arrival date & time 01/04/13  1922 History   First MD Initiated Contact with Patient 01/04/13 1929     Chief Complaint  Patient presents with  . Headache   (Consider location/radiation/quality/duration/timing/severity/associated sxs/prior Treatment) Patient is a 79 y.o. female presenting with headaches. The history is provided by the patient and a relative. No language interpreter was used.  Headache Pain location:  Occipital Quality:  Dull Radiates to:  L neck and R neck Severity currently:  7/10 Severity at highest:  7/10 Onset quality:  Gradual Timing:  Constant Progression:  Unchanged Chronicity:  Recurrent Similar to prior headaches: yes   Context: not activity   Relieved by:  Nothing Worsened by:  Nothing tried Ineffective treatments:  None tried Associated symptoms: hearing loss   Associated symptoms: no abdominal pain, no congestion, no cough, no diarrhea, no fever, no nausea, no sore throat and no vomiting     History reviewed. No pertinent past medical history. History reviewed. No pertinent past surgical history. History reviewed. No pertinent family history. History  Substance Use Topics  . Smoking status: Never Smoker   . Smokeless tobacco: Not on file  . Alcohol Use: Not on file   OB History   Grav Para Term Preterm Abortions TAB SAB Ect Mult Living                 Review of Systems  Constitutional: Negative for fever.  HENT: Positive for hearing loss. Negative for congestion, sore throat and rhinorrhea.   Eyes: Positive for visual disturbance.  Respiratory: Negative for cough and shortness of breath.   Cardiovascular: Negative for chest pain.  Gastrointestinal: Negative for nausea, vomiting, abdominal pain and diarrhea.  Genitourinary: Negative for dysuria and hematuria.  Skin: Negative for rash.  Neurological: Positive for light-headedness and headaches. Negative for syncope.  All other systems reviewed and are  negative.    Allergies  Morphine and related  Home Medications   Current Outpatient Rx  Name  Route  Sig  Dispense  Refill  . aspirin 81 MG chewable tablet   Oral   Chew 81 mg by mouth daily.         . hydrochlorothiazide (HYDRODIURIL) 25 MG tablet   Oral   Take 25 mg by mouth daily.         Marland Kitchen HYDROcodone-acetaminophen (NORCO/VICODIN) 5-325 MG per tablet   Oral   Take 1 tablet by mouth every 6 (six) hours as needed for pain.         . hydrOXYzine (ATARAX/VISTARIL) 25 MG tablet   Oral   Take 25 mg by mouth 3 (three) times daily as needed for itching.         . metFORMIN (GLUCOPHAGE) 500 MG tablet   Oral   Take 500 mg by mouth 2 (two) times daily with a meal.         . potassium chloride SA (K-DUR,KLOR-CON) 20 MEQ tablet   Oral   Take 20 mEq by mouth daily.         . sitaGLIPtin (JANUVIA) 100 MG tablet   Oral   Take 100 mg by mouth daily.          BP 152/68  Pulse 73  Temp(Src) 98 F (36.7 C) (Oral)  Resp 18  SpO2 100% Physical Exam  Nursing note and vitals reviewed. Constitutional: She is oriented to person, place, and time. She appears well-developed and well-nourished.  HENT:  Head: Normocephalic and atraumatic.  Right Ear: External ear normal.  Left Ear: External ear normal.  Eyes: EOM are normal. Pupils are equal, round, and reactive to light.  Neck: Normal range of motion. Neck supple.  Cardiovascular: Normal rate, regular rhythm and intact distal pulses.  Exam reveals no gallop and no friction rub.   No murmur heard. Pulmonary/Chest: Effort normal and breath sounds normal. No respiratory distress. She has no wheezes. She has no rales. She exhibits no tenderness.  Abdominal: Soft. Bowel sounds are normal. She exhibits no distension. There is no tenderness. There is no rebound.  Musculoskeletal: Normal range of motion. She exhibits no edema and no tenderness.  Lymphadenopathy:    She has no cervical adenopathy.  Neurological: She is alert  and oriented to person, place, and time.  Neurologic exam: CN I-XII: grossly intact, Sensation: normal in upper and lower extremities, Strength 5/5 in both upper and lower extremities, Coordination intact.  Skin: Skin is warm. No rash noted.  Psychiatric: She has a normal mood and affect. Her behavior is normal.    ED Course  Procedures (including critical care time) Labs Review Labs Reviewed  CBC WITH DIFFERENTIAL - Abnormal; Notable for the following:    Hemoglobin 11.2 (*)    HCT 33.4 (*)    Neutrophils Relative % 42 (*)    All other components within normal limits  COMPREHENSIVE METABOLIC PANEL - Abnormal; Notable for the following:    Potassium 3.4 (*)    Glucose, Bld 103 (*)    GFR calc non Af Amer 67 (*)    GFR calc Af Amer 77 (*)    All other components within normal limits  APTT  PROTIME-INR  ETHANOL  TROPONIN I   Imaging Review Ct Head Wo Contrast  01/04/2013   CLINICAL DATA:  Headache.  EXAM: CT HEAD WITHOUT CONTRAST  TECHNIQUE: Contiguous axial images were obtained from the base of the skull through the vertex without intravenous contrast.  COMPARISON:  None.  FINDINGS: Patchy hypoattenuation in the subcortical and periventricular deep white matter is consistent chronic microvascular ischemic change. No evidence of acute intracranial abnormality including infarction, hemorrhage, mass lesion, mass effect, midline shift or abnormal extra-axial fluid collection is identified. The maxillary sinuses are hypoplastic. Small mucous retention cyst or polyp in the right maxillary sinus is noted.  IMPRESSION: No acute finding.  Chronic microvascular ischemic change.   Electronically Signed   By: Drusilla Kanner M.D.   On: 01/04/2013 22:16    MDM   1. Headache    7:39 PM Pt is a 79 y.o. female with pertinent PMHX of DM, HTN,asthma who presents to the ED with headache. For the past couple weeks, new onset of occipital headache with radaition into neck. No thunderclap symptoms. No  fevers or recent illness. Some mild hearing loss and blurry vision. No photo[hobia, no phonophobia. No history of strokes, no facial droop or weakness. No slurred speech or behavioral changes per family. Denies chest pain, has baseline shortness of breath. Per pt no changes in gait, walks at baseline with walker. Off and on pain for the past couple weeks. Denies fever or meningismus  On exam: AFVSS. Neurologic exam: CN I-XII: grossly intact, Sensation: normal in upper and lower extremities, Strength 5/5 in both upper and lower extremities, Coordination intact. Neck supple, full ROM  Differential includes MSK pain, possible mass. Low clinical suspicion for vertebral or carotid dissection. Low clinical suspicion for meningitis. Plan for CT head, CBC, CMP, APTT, Pt/INR, ETOH and trop. Will trial  tylenol for headache.  EKG personally reviewed by myself showed NSR, RBBB, LAFB Rate of 79, PR , QRS QT/QTC 426/430ms, left axis, without evidence of new ischemia. Comparison showed similar, indication: headache  Review of labs: Troponin <0.30, CBC showed no leukocytosis, H&H 11.2/33.4. CMP showed hypokalemia, no other electrolyte abnormalities. No elevated LFTs. Coags within normal limits, ETOH <11.   CT head negative for mass effect or intracranial hemorrhage. Likely MSK cause of headache. On re-eval pt feeling subjectively better and feels ready to go home. Will have pt follow up with PCP and good return precautions given.  10:52 PM: I have discussed the diagnosis/risks/treatment options with the patient and family and believe the pt to be eligible for discharge home to follow-up with PCP in 1 week. We also discussed returning to the ED immediately if new or worsening sx occur. We discussed the sx which are most concerning (e.g., worsening symptoms) that necessitate immediate return. Any new prescriptions provided to the patient are listed below.   New Prescriptions   No medications on file     The patient appears reasonably screened and/or stabilized for discharge and I doubt any other medical condition or other [facility] requiring further screening, evaluation or treatment in the ED at this time prior to discharge . Pt in agreement with discharge plan. Return precautions given. Pt discharged VSS  Labs, EKG and imaging reviewed by myself and considered in medical decision making if ordered.  Imaging interpreted by radiology. Pt was discussed with my attending, Dr. Kavin Leech, MD 01/04/13 [phone]

## 2013-01-04 NOTE — ED Notes (Signed)
Per EMS- patient complaining of headache to back of head, radiating to lower neck and shoulders for about 45 minutes. Stroke screen completed in field. Negative for stroke. Patient took Norco with no immediate relief.

## 2013-01-04 NOTE — ED Notes (Signed)
Patient states she has a headache to the back of her head radiating to back of neck and shoulders. Patient states she is having headache pain 6-7/10 but it has gotten better since she took her Norco before she came in.

## 2013-01-05 NOTE — ED Provider Notes (Addendum)
I saw and evaluated the patient, reviewed the resident's note and I agree with the findings and plan.   Patient with new onset headache over past week. Is intermittent, lasts no more than 20 minutes at a time. No focal neuro signs on exam, and patient currently does not have the headache in the ED. Is well appearing and has benign CT, no signs of mass. Doubt SAH or CVA based on presentation. Likely a new onset tension headache. Will have her f/u closely with her PCP and discussed strict return precautions.  Audree Camel, MD 01/05/13 1431   Date: 01/26/2013  Rate: 79  Rhythm: normal sinus rhythm  QRS Axis: left  Intervals: normal  ST/T Wave abnormalities: normal  Conduction Disutrbances:right bundle branch block  Narrative Interpretation:   Old EKG Reviewed: none available    Audree Camel, MD 01/26/13 1309

## 2013-02-07 ENCOUNTER — Emergency Department (HOSPITAL_COMMUNITY)
Admission: EM | Admit: 2013-02-07 | Discharge: 2013-02-07 | Disposition: A | Discharge status: Home / Self Care | Payer: PRIVATE HEALTH INSURANCE | Attending: Emergency Medicine | Admitting: Emergency Medicine

## 2013-02-07 ENCOUNTER — Encounter (HOSPITAL_COMMUNITY): Payer: Self-pay | Admitting: Emergency Medicine

## 2013-02-07 ENCOUNTER — Emergency Department (HOSPITAL_COMMUNITY): Payer: PRIVATE HEALTH INSURANCE

## 2013-02-07 DIAGNOSIS — I10 Essential (primary) hypertension: Secondary | ICD-10-CM | POA: Insufficient documentation

## 2013-02-07 DIAGNOSIS — R1110 Vomiting, unspecified: Secondary | ICD-10-CM | POA: Insufficient documentation

## 2013-02-07 DIAGNOSIS — R51 Headache: Secondary | ICD-10-CM | POA: Insufficient documentation

## 2013-02-07 DIAGNOSIS — J329 Chronic sinusitis, unspecified: Secondary | ICD-10-CM

## 2013-02-07 DIAGNOSIS — E119 Type 2 diabetes mellitus without complications: Secondary | ICD-10-CM | POA: Insufficient documentation

## 2013-02-07 DIAGNOSIS — Z79899 Other long term (current) drug therapy: Secondary | ICD-10-CM | POA: Insufficient documentation

## 2013-02-07 DIAGNOSIS — G8929 Other chronic pain: Secondary | ICD-10-CM | POA: Insufficient documentation

## 2013-02-07 DIAGNOSIS — I509 Heart failure, unspecified: Secondary | ICD-10-CM | POA: Insufficient documentation

## 2013-02-07 DIAGNOSIS — Z8719 Personal history of other diseases of the digestive system: Secondary | ICD-10-CM | POA: Insufficient documentation

## 2013-02-07 DIAGNOSIS — J45909 Unspecified asthma, uncomplicated: Secondary | ICD-10-CM | POA: Insufficient documentation

## 2013-02-07 DIAGNOSIS — Z7982 Long term (current) use of aspirin: Secondary | ICD-10-CM | POA: Insufficient documentation

## 2013-02-07 DIAGNOSIS — M129 Arthropathy, unspecified: Secondary | ICD-10-CM | POA: Insufficient documentation

## 2013-02-07 HISTORY — DX: Unspecified asthma, uncomplicated: J45.909

## 2013-02-07 HISTORY — DX: Unspecified osteoarthritis, unspecified site: M19.90

## 2013-02-07 HISTORY — DX: Headache, unspecified: R51.9

## 2013-02-07 HISTORY — DX: Type 2 diabetes mellitus without complications: E11.9

## 2013-02-07 HISTORY — DX: Heart failure, unspecified: I50.9

## 2013-02-07 HISTORY — DX: Unspecified hemorrhoids: K64.9

## 2013-02-07 HISTORY — DX: Constipation, unspecified: K59.00

## 2013-02-07 HISTORY — DX: Headache: R51

## 2013-02-07 HISTORY — DX: Other chronic pain: G89.29

## 2013-02-07 HISTORY — DX: Essential (primary) hypertension: I10

## 2013-02-07 LAB — COMPREHENSIVE METABOLIC PANEL
ALT: 8 U/L (ref 0–35)
Alkaline Phosphatase: 65 U/L (ref 39–117)
BUN: 15 mg/dL (ref 6–23)
CO2: 27 mEq/L (ref 19–32)
Calcium: 9.1 mg/dL (ref 8.4–10.5)
Chloride: 103 mEq/L (ref 96–112)
GFR calc Af Amer: 75 mL/min — ABNORMAL LOW (ref >90)
GFR calc non Af Amer: 65 mL/min — ABNORMAL LOW (ref >90)
Glucose, Bld: 109 mg/dL — ABNORMAL HIGH (ref 70–99)
Sodium: 140 mEq/L (ref 135–145)

## 2013-02-07 LAB — CBC WITH DIFFERENTIAL/PLATELET
Eosinophils Relative: 5 % (ref 0–5)
HCT: 31.8 % — ABNORMAL LOW (ref 36.0–46.0)
Hemoglobin: 10.7 g/dL — ABNORMAL LOW (ref 12.0–15.0)
Lymphocytes Relative: 38 % (ref 12–46)
Lymphs Abs: 2.6 10*3/uL (ref 0.7–4.0)
MCH: 28.9 pg (ref 26.0–34.0)
MCV: 85.9 fL (ref 78.0–100.0)
Monocytes Absolute: 0.7 10*3/uL (ref 0.1–1.0)
Monocytes Relative: 11 % (ref 3–12)
Neutro Abs: 3.1 10*3/uL (ref 1.7–7.7)
Platelets: 201 10*3/uL (ref 150–400)
RBC: 3.7 MIL/uL — ABNORMAL LOW (ref 3.87–5.11)
WBC: 6.7 10*3/uL (ref 4.0–10.5)

## 2013-02-07 LAB — URINALYSIS, ROUTINE W REFLEX MICROSCOPIC
Bilirubin Urine: NEGATIVE
Ketones, ur: NEGATIVE mg/dL
Nitrite: NEGATIVE
Specific Gravity, Urine: 1.006 (ref 1.005–1.030)
Urobilinogen, UA: 0.2 mg/dL (ref 0.0–1.0)
pH: 7 (ref 5.0–8.0)

## 2013-02-07 MED ORDER — FLUTICASONE PROPIONATE 50 MCG/ACT NA SUSP: 2.0000 | Freq: Every day | NASAL | Status: DC

## 2013-02-07 MED ORDER — SODIUM CHLORIDE 0.9 % IV BOLUS (SEPSIS)
500.0000 mL | Freq: Once | INTRAVENOUS | Status: AC
  Administered 2013-02-07: 500 mL via INTRAVENOUS

## 2013-02-07 MED ORDER — ONDANSETRON HCL 4 MG/2ML IJ SOLN
4.0000 mg | Freq: Once | INTRAMUSCULAR | Status: AC
  Administered 2013-02-07: 4 mg via INTRAVENOUS
  Filled 2013-02-07: qty 2

## 2013-02-07 MED ORDER — AZITHROMYCIN 250 MG PO TABS: 250.0000 mg | ORAL_TABLET | Freq: Every day | ORAL | Status: DC

## 2013-02-07 MED ORDER — SODIUM CHLORIDE 0.9 % IV BOLUS (SEPSIS): 500.0000 mL | Freq: Once | INTRAVENOUS | Status: DC

## 2013-02-07 MED ORDER — FENTANYL CITRATE 0.05 MG/ML IJ SOLN
25.0000 ug | Freq: Once | INTRAMUSCULAR | Status: AC
  Administered 2013-02-07: 25 ug via INTRAVENOUS
  Filled 2013-02-07: qty 2

## 2013-02-07 NOTE — ED Provider Notes (Addendum)
CSN: [id number]     Arrival date & time 02/07/13  1448 History   First MD Initiated Contact with Patient 02/07/13 1500     Chief Complaint  Patient presents with  . Weakness  . Emesis   (Consider location/radiation/quality/duration/timing/severity/associated sxs/prior Treatment) HPI Comments: Pt will not answer questions on only mumbles yes and no.  She does say yes to a headache but from assisted living they states she had possible diarrhea and maybe emesis.  Patient is a 79 y.o. female presenting with vomiting and headaches. The history is provided by the patient and the EMS personnel. The history is limited by the absence of a caregiver and the condition of the patient.  Emesis Associated symptoms: headaches   Associated symptoms: no abdominal pain and no diarrhea   Headache Associated symptoms: vomiting   Associated symptoms: no abdominal pain, no cough, no diarrhea, no focal weakness, no nausea and no visual change   Risk factors comment:  Seen in the ER last month for headache.   Past Medical History  Diagnosis Date  . Arthritis   . Asthma   . CHF (congestive heart failure)   . Hypertension   . Diabetes mellitus without complication   . Chronic headaches   . Constipation   . Hemorrhoids without complication    Past Surgical History  Procedure Laterality Date  . Cholecystectomy    . Back surgery     History reviewed. No pertinent family history. History  Substance Use Topics  . Smoking status: Never Smoker   . Smokeless tobacco: Not on file  . Alcohol Use: Not on file   OB History   Grav Para Term Preterm Abortions TAB SAB Ect Mult Living                 Review of Systems  Unable to perform ROS Respiratory: Negative for cough.   Gastrointestinal: Positive for vomiting. Negative for nausea, abdominal pain and diarrhea.  Neurological: Positive for headaches. Negative for focal weakness.    Allergies  Morphine and related  Home Medications   Current  Outpatient Rx  Name  Route  Sig  Dispense  Refill  . aspirin 81 MG chewable tablet   Oral   Chew 81 mg by mouth daily.         . hydrochlorothiazide (HYDRODIURIL) 25 MG tablet   Oral   Take 25 mg by mouth daily.         Marland Kitchen HYDROcodone-acetaminophen (NORCO/VICODIN) 5-325 MG per tablet   Oral   Take 1 tablet by mouth every 6 (six) hours as needed for pain.         . hydrOXYzine (ATARAX/VISTARIL) 25 MG tablet   Oral   Take 25 mg by mouth 3 (three) times daily as needed for itching.         . metFORMIN (GLUCOPHAGE) 500 MG tablet   Oral   Take 500 mg by mouth 2 (two) times daily with a meal.         . potassium chloride SA (K-DUR,KLOR-CON) 20 MEQ tablet   Oral   Take 20 mEq by mouth daily.         . sitaGLIPtin (JANUVIA) 100 MG tablet   Oral   Take 100 mg by mouth daily.          BP 135/55  Pulse 67  Temp(Src) 97.5 F (36.4 C) (Oral)  Resp 16  SpO2 95% Physical Exam  Nursing note and vitals reviewed. Constitutional: She appears well-developed  and well-nourished. No distress.  Pt keeps her eyes closed and appears to be uncomfortable  HENT:  Head: Normocephalic and atraumatic.  Right Ear: Tympanic membrane and ear canal normal.  Left Ear: Tympanic membrane and ear canal normal.  Mouth/Throat: Oropharynx is clear and moist.  Eyes: Conjunctivae and EOM are normal. Pupils are equal, round, and reactive to light.  No photophobia  Neck: Normal range of motion. Neck supple.  Cardiovascular: Normal rate, regular rhythm and intact distal pulses.   No murmur heard. Pulmonary/Chest: Effort normal and breath sounds normal. No respiratory distress. She has no wheezes. She has no rales.  Abdominal: Soft. She exhibits no distension. There is no tenderness. There is no rebound and no guarding.  Musculoskeletal: Normal range of motion. She exhibits no edema and no tenderness.  Neurological:  Can move all ext but poor effort and will not hold up against gravity.  Keeps  eyes closed but will open on request.  No notable facial droop or unilateral weakness  Skin: Skin is warm and dry. No rash noted. No erythema.  Psychiatric: She has a normal mood and affect. Her behavior is normal.    ED Course  Procedures (including critical care time) Labs Review Labs Reviewed  CBC WITH DIFFERENTIAL - Abnormal; Notable for the following:    RBC 3.70 (*)    Hemoglobin 10.7 (*)    HCT 31.8 (*)    All other components within normal limits  COMPREHENSIVE METABOLIC PANEL - Abnormal; Notable for the following:    Glucose, Bld 109 (*)    Albumin 3.3 (*)    GFR calc non Af Amer 65 (*)    GFR calc Af Amer 75 (*)    All other components within normal limits  URINALYSIS, ROUTINE W REFLEX MICROSCOPIC   Imaging Review Ct Head Wo Contrast  02/07/2013   CLINICAL DATA:  Altered level of consciousness. Diabetes. Hypertension. Headache.  EXAM: CT HEAD WITHOUT CONTRAST  TECHNIQUE: Contiguous axial images were obtained from the base of the skull through the vertex without intravenous contrast.  COMPARISON:  01/04/2013  FINDINGS: The brainstem, cerebellum, cerebral peduncles, thalamus, basal ganglia, basilar cisterns, and ventricular system appear within normal limits.  Periventricular white matter and corona radiata hypodensities favor chronic ischemic microvascular white matter disease.  No intracranial hemorrhage, mass lesion, or acute CVA. Chronic sphenoid sinusitis noted.  IMPRESSION: 1. Chronic sphenoid sinusitis. 2. Periventricular white matter and corona radiata hypodensities favor chronic ischemic microvascular white matter disease.   Electronically Signed   By: Herbie Baltimore M.D.   On: 02/07/2013 18:10    EKG Interpretation     Ventricular Rate:  68 PR Interval:  214 QRS Duration: 161 QT Interval:  433 QTC Calculation: 460 R Axis:   -75 Text Interpretation:  Sinus rhythm Borderline prolonged PR interval RBBB and LAFB No significant change since last tracing             MDM   1. Headache   2. Sinusitis     Patient presents from her assisted living facility due to not acting herself. Difficult to get an accurate exam as patient just mumbles yes and no but will not give any further history. She does denies having chest pain, abdominal pain or shortness of breath and does admit to a headache. She will not further described the headache. On exam she has mild motor movements bilaterally but minimal effort on either side.  Looking back patient presented to the emergency room approximately one month ago  with a headache which is felt to be a tension headache and was discharged home. Today patient has a normal blood pressure and no focal exam findings other than poor effort.  Unclear of what the source of her change in mental status and headache are today. CBC, CMP, UA, head CT pending. Patient given pain control.  6:06 PM Labs wnl.  After pain meds pt is now more awake and answering questions and speaking with family and states she feels better. CT pending.  6:27 PM CT neg for acute pathology but sphenoid sinusitis and since pt's HA are starting to get worse will give course of abx and nasal spray.  Pt feeling much better and ready to go home.  Gwyneth Sprout, MD 02/07/13 1191  Gwyneth Sprout, MD 02/07/13 4782

## 2013-02-07 NOTE — ED Notes (Signed)
3 attempts for IV by 2 different nurses. IV team paged.

## 2013-02-07 NOTE — ED Notes (Addendum)
Pt's daughter states that earlier this afternoon the pt called her sister and said she was coming up to the hospital because she was sick and had been vomiting.  Pt is now non-verbal.

## 2013-02-07 NOTE — ED Notes (Signed)
IV team at bedside 

## 2013-02-07 NOTE — ED Notes (Addendum)
Pt's friends from assisted living called EMS concerned about pt's recent diarrhea.  Pt denies diarrhea, last BM yesterday.  Pt friends state pt is normally talkative and cognizant but is now only murmured responses.  NAD. Alert.

## 2013-03-03 ENCOUNTER — Other Ambulatory Visit: Payer: Self-pay

## 2013-03-03 DIAGNOSIS — Z1231 Encounter for screening mammogram for malignant neoplasm of breast: Secondary | ICD-10-CM

## 2013-03-15 ENCOUNTER — Encounter: Payer: Self-pay | Admitting: Podiatry

## 2013-03-15 ENCOUNTER — Ambulatory Visit (INDEPENDENT_AMBULATORY_CARE_PROVIDER_SITE_OTHER): Payer: Medicare Other | Admitting: Podiatry

## 2013-03-15 VITALS — BP 153/77 | HR 74 | Resp 16

## 2013-03-15 DIAGNOSIS — Q828 Other specified congenital malformations of skin: Secondary | ICD-10-CM

## 2013-03-15 DIAGNOSIS — M79609 Pain in unspecified limb: Secondary | ICD-10-CM

## 2013-03-15 DIAGNOSIS — B351 Tinea unguium: Secondary | ICD-10-CM

## 2013-03-15 DIAGNOSIS — E1159 Type 2 diabetes mellitus with other circulatory complications: Secondary | ICD-10-CM

## 2013-03-15 NOTE — Progress Notes (Signed)
Subjective:     Patient ID: Angelica Benson, female   DOB: 1928-09-10, 79 y.o.   MRN: [id number]  HPI patient is found to have nail disease with thickness and pain 1-5 both feet and lesions underneath the big toes of both feet they become painful when pressed. She is a diabetic and does not have good circulatory status   Review of Systems     Objective:   Physical Exam Diminished neurovascular status and also diminished hair growth and dry skin noted. Nail disease with thickness 1-5 bilateral and small lesions plantar aspect hallux bilateral that are painful when pressed    Assessment:     At risk diabetic with porokeratotic lesions sub-hallux bilateral and nail disease 1-5 both feet    Plan:     Debridement nails 1-5 both feet with no iatrogenic bleeding noted and debridement lesions hallux plantar both feet with no iatrogenic bleeding

## 2013-03-15 NOTE — Patient Instructions (Signed)
Diabetes and Foot Care Diabetes may cause you to have problems because of poor blood supply (circulation) to your feet and legs. This may cause the skin on your feet to become thinner, break easier, and heal more slowly. Your skin may become dry, and the skin may peel and crack. You may also have nerve damage in your legs and feet causing decreased feeling in them. You may not notice minor injuries to your feet that could lead to infections or more serious problems. Taking care of your feet is one of the most important things you can do for yourself.  HOME CARE INSTRUCTIONS  Wear shoes at all times, even in the house. Do not go barefoot. Bare feet are easily injured.  Check your feet daily for blisters, cuts, and redness. If you cannot see the bottom of your feet, use a mirror or ask someone for help.  Wash your feet with warm water (do not use hot water) and mild soap. Then pat your feet and the areas between your toes until they are completely dry. Do not soak your feet as this can dry your skin.  Apply a moisturizing lotion or petroleum jelly (that does not contain alcohol and is unscented) to the skin on your feet and to dry, brittle toenails. Do not apply lotion between your toes.  Trim your toenails straight across. Do not dig under them or around the cuticle. File the edges of your nails with an emery board or nail file.  Do not cut corns or calluses or try to remove them with medicine.  Wear clean socks or stockings every day. Make sure they are not too tight. Do not wear knee-high stockings since they may decrease blood flow to your legs.  Wear shoes that fit properly and have enough cushioning. To break in new shoes, wear them for just a few hours a day. This prevents you from injuring your feet. Always look in your shoes before you put them on to be sure there are no objects inside.  Do not cross your legs. This may decrease the blood flow to your feet.  If you find a minor scrape,  cut, or break in the skin on your feet, keep it and the skin around it clean and dry. These areas may be cleansed with mild soap and water. Do not cleanse the area with peroxide, alcohol, or iodine.  When you remove an adhesive bandage, be sure not to damage the skin around it.  If you have a wound, look at it several times a day to make sure it is healing.  Do not use heating pads or hot water bottles. They may burn your skin. If you have lost feeling in your feet or legs, you may not know it is happening until it is too late.  Make sure your health care provider performs a complete foot exam at least annually or more often if you have foot problems. Report any cuts, sores, or bruises to your health care provider immediately. SEEK MEDICAL CARE IF:   You have an injury that is not healing.  You have cuts or breaks in the skin.  You have an ingrown nail.  You notice redness on your legs or feet.  You feel burning or tingling in your legs or feet.  You have pain or cramps in your legs and feet.  Your legs or feet are numb.  Your feet always feel cold. SEEK IMMEDIATE MEDICAL CARE IF:   There is increasing redness,   swelling, or pain in or around a wound.  There is a red line that goes up your leg.  Pus is coming from a wound.  You develop a fever or as directed by your health care provider.  You notice a bad smell coming from an ulcer or wound. Document Released: 03/29/2000 Document Revised: 12/02/2012 Document Reviewed: 09/08/2012 [employer] Patient Information 2014 [employer]  

## 2013-03-31 ENCOUNTER — Ambulatory Visit
Admission: RE | Admit: 2013-03-31 | Discharge: 2013-03-31 | Disposition: A | Discharge status: Home / Self Care | Payer: PRIVATE HEALTH INSURANCE | Source: Ambulatory Visit

## 2013-03-31 DIAGNOSIS — Z1231 Encounter for screening mammogram for malignant neoplasm of breast: Secondary | ICD-10-CM

## 2013-06-09 ENCOUNTER — Ambulatory Visit: Payer: PRIVATE HEALTH INSURANCE | Admitting: Endocrinology

## 2013-06-14 ENCOUNTER — Other Ambulatory Visit: Payer: Self-pay | Admitting: *Deleted

## 2013-06-14 ENCOUNTER — Encounter: Payer: Self-pay | Admitting: Endocrinology

## 2013-06-14 ENCOUNTER — Ambulatory Visit (INDEPENDENT_AMBULATORY_CARE_PROVIDER_SITE_OTHER): Payer: PRIVATE HEALTH INSURANCE | Admitting: Endocrinology

## 2013-06-14 ENCOUNTER — Other Ambulatory Visit (INDEPENDENT_AMBULATORY_CARE_PROVIDER_SITE_OTHER): Payer: PRIVATE HEALTH INSURANCE

## 2013-06-14 VITALS — BP 148/64 | HR 92 | Temp 97.8°F | Resp 16 | Ht 62.0 in | Wt 235.4 lb

## 2013-06-14 DIAGNOSIS — E119 Type 2 diabetes mellitus without complications: Secondary | ICD-10-CM

## 2013-06-14 DIAGNOSIS — D649 Anemia, unspecified: Secondary | ICD-10-CM

## 2013-06-14 DIAGNOSIS — I10 Essential (primary) hypertension: Secondary | ICD-10-CM

## 2013-06-14 DIAGNOSIS — J45909 Unspecified asthma, uncomplicated: Secondary | ICD-10-CM

## 2013-06-14 DIAGNOSIS — IMO0001 Reserved for inherently not codable concepts without codable children: Secondary | ICD-10-CM

## 2013-06-14 DIAGNOSIS — E1165 Type 2 diabetes mellitus with hyperglycemia: Principal | ICD-10-CM

## 2013-06-14 DIAGNOSIS — R252 Cramp and spasm: Secondary | ICD-10-CM

## 2013-06-14 MED ORDER — GLUCOSE BLOOD VI STRP: ORAL_STRIP | Status: DC

## 2013-06-14 MED ORDER — FREESTYLE LANCETS MISC: Status: DC

## 2013-06-14 NOTE — Patient Instructions (Addendum)
Please check blood sugars at least half the time about 2 hours after any meal and as directed on waking up.  Please bring blood sugar monitor to each visit  Use regular oatmeal not instant; add egg or low fat protein at [facility]

## 2013-06-14 NOTE — Progress Notes (Signed)
Patient ID: Angelica Benson, female   DOB: 10-08-1928, 80 y.o.   MRN: [id number]   Reason for Appointment : Consultation for Type 2 Diabetes  History of Present Illness          Diagnosis: Type 2 diabetes mellitus, date of diagnosis: 1992       Past history: She is unclear about the date of her diagnosis and treatment history. At onset she had symptoms of frequent urination, weakness and sleepiness. She has been on various oral hypoglycemic drugs since her diagnosis. Has been on metformin since at least 2006 and Januvia since 2007. Also previously had taken glyburide and Avandia Her A1c has generally been in the 5.9-6.4 range previously  Recent history:        She is seen today to establish as a new patient. She is checking her blood sugars primarily fasting and only today after breakfast Oral hypoglycemic drugs the patient is taking are: Metformin 500 mg twice a day, Januvia      Side effects from medications have been: None  Glucose monitoring:  done one time a day         Glucometer: One Touch.      Blood Glucose readings from meter download:  PREMEAL Breakfast pcb Dinner Bedtime  Overall   Glucose range:  111-130   186      Median:  120      125    Hypoglycemia:   none  Glycemic control: Her A1c was 6.9 in 9/14  Lab Results  Component Value Date   HGBA1C  Value: 6.2 (NOTE)                                                                       According to the ADA Clinical Practice Recommendations for 2011, when HbA1c is used as a screening test:   >=6.5%   Diagnostic of Diabetes Mellitus           (if abnormal result  is confirmed)  5.7-6.4%   Increased risk of developing Diabetes Mellitus  References:Diagnosis and Classification of Diabetes Mellitus,Diabetes [id number] 1):S62-S69 and Standards of Medical Care in         Diabetes - 2011,Diabetes [id number]  (Suppl 1):S11-S61.* 03/14/2010   Lab Results  Component Value Date   CREATININE 0.82 02/07/2013    Self-care:  The diet that the patient has been following is: tries to limit high-fat foods    Meals: 3 meals per day.  at breakfast has instant oatmeal and toast. At lunch will have sandwich and fruit; dinner is chicken, starch and vegetables; her snacks are fruit, juice, peanut butter or cheese         Exercise:  only occasionally will walk around her living area         Dietician visit: Most recent: 2000            Compliance with the medical regimen: Good Retinal exam: Most recent: 12/14.   Urine microalbumin was normal in 05/2012  Weight history: Wt Readings from Last 3 Encounters:  06/14/13 235 lb 6.4 oz (106.777 kg)      Medication List       This list is accurate as of: 06/14/13  4:37 PM.  Always use  your most recent med list.               acetaminophen 500 MG tablet  Commonly known as:  TYLENOL  Take 500 mg by mouth every 6 (six) hours as needed for pain.     aspirin 81 MG chewable tablet  Chew 81 mg by mouth daily.     COMBIVENT RESPIMAT 20-100 MCG/ACT Aers respimat  Generic drug:  Ipratropium-Albuterol  Inhale 1 puff into the lungs 2 (two) times daily.     fluticasone 50 MCG/ACT nasal spray  Commonly known as:  FLONASE  Place 2 sprays into the nose daily.     hydrochlorothiazide 25 MG tablet  Commonly known as:  HYDRODIURIL  Take 25 mg by mouth daily.     HYDROcodone-acetaminophen 5-325 MG per tablet  Commonly known as:  NORCO/VICODIN  Take 1 tablet by mouth every 6 (six) hours as needed for pain.     hydrOXYzine 25 MG tablet  Commonly known as:  ATARAX/VISTARIL  Take 25 mg by mouth 3 (three) times daily as needed for itching.     metFORMIN 500 MG tablet  Commonly known as:  GLUCOPHAGE  Take 500 mg by mouth 2 (two) times daily with a meal.     potassium chloride SA 20 MEQ tablet  Commonly known as:  K-DUR,KLOR-CON  Take 20 mEq by mouth daily.     PROAIR HFA 108 (90 BASE) MCG/ACT inhaler  Generic drug:  albuterol  Inhale 2 puffs into the lungs every 6 (six)  hours as needed.     sitaGLIPtin 100 MG tablet  Commonly known as:  JANUVIA  Take 100 mg by mouth daily.        Allergies:  Allergies  Allergen Reactions  . Morphine And Related Rash    Past Medical History  Diagnosis Date  . Arthritis   . Asthma   . CHF (congestive heart failure)   . Hypertension   . Diabetes mellitus without complication   . Chronic headaches   . Constipation   . Hemorrhoids without complication     Past Surgical History  Procedure Laterality Date  . Cholecystectomy    . Back surgery    . Knee surgery    . Abdominal hysterectomy      No family history on file.  Social History:  reports that she has never smoked. She does not have any smokeless tobacco history on file. She reports that she does not use illicit drugs. Her alcohol history is not on file.    Review of Systems       Lipids: Last LDL was 89, has never been on treatment      Recently has had sinus headaches.                  Skin: No rash or infections. She complains of itching in her legs     Thyroid:  Has fatigue, TSH was normal in 9/14. Reportedly has had a small goiter previously without hypothyroidism     The blood pressure has been controlled with HCTZ alone      No swelling of feet recently, previously treated with HCTZ 25 mg.     No shortness of breath on exertion. Has history of asthma      Bowel habits: Normal. No abdominal pain       No frequency of urination or nocturia        No recent joint  Pains; may feel stiff. Some discomfort in  her shin bones      No  depression          No history of Numbness, tingling or burning in  feet; occasionally may feel pins and needles     Occasionally will get muscle cramps     She has had chronic anemia with her last hemoglobin being 11.1.  Has not been on any supplements for this, no recent vitamin B 12 level available    LABS:  Pending  Physical Examination:  BP 148/64  Pulse 92  Temp(Src) 97.8 F (36.6 C)   Resp 16  Ht 5\' 2"  (1.575 m)  Wt 235 lb 6.4 oz (106.777 kg)  BMI 43.04 kg/m2  SpO2 97%  GENERAL:         She has generalized obesity, no cushingoid features.  She is alert, pleasant and cooperative HEENT:         Eye exam shows normal external appearance. Fundus exam shows no retinopathy, right fundus less easily viewed.  Oral exam shows normal mucosa, pharynx and tongue NECK:         General:  Neck exam shows no lymphadenopathy. Carotids are normal to palpation and no bruit heard.  Thyroid is not enlarged and no nodules felt.   LUNGS:         Chest is symmetrical. Lungs are clear to auscultation.Marland Kitchen   HEART:         Heart sounds:  S1 and S2 are normal. No murmurs or clicks heard., no S3 or S4.   ABDOMEN:         General:  There is no distention present. Liver and spleen are not palpable. No other mass or tenderness present.  EXTREMITIES:    she has marked enlargement of her thighs without pitting.   There is no pedal edema.  NEUROLOGICAL:        Vibration sense is moderately reduced in toes. Ankle jerks are absent bilaterally.          Diabetic foot exam:  as in the foot exam section MUSCULOSKELETAL:  . she has osteoarthritic changes of her fingers. Knee joints are normal to inspection. Spine is normal  shape   PEDAL pulses: present dorsalis pedis only SKIN:   Minimal macular rash on the lower legs on the outer sides        ASSESSMENT:  Diabetes type 2, relatively mild and usually well controlled especially considering her age Although her fasting blood sugars look excellent she is not doing any readings after meals. Does have a high reading after breakfast related to a higher carbohydrate intake without any protein Currently taking low dose metformin and Januvia  Complications: signs of neuropathy present, No recent urine microalbumin available  Probable hypertension, Controlled with HCTZ.  Anemia, Chronic  Occasional leg cramps Possibly from hypokalemia or hypomagnesemia since she is  using chronic diuretics  No history of hyperlipidemia on microvascular disease  PLAN:    Discussed balanced meals especially breakfast with more protein  To check more readings after meals and less frequently on waking up  Encouraged her to be as active as possible  Will check her A1c and other labs today as baseline  Establish with PCP for general care including evaluation and followup of anemia, asthma, nonspecific fatigue and itching of legs   Lotus Gover 06/14/2013, 4:38 PM

## 2013-06-15 LAB — CBC WITH DIFFERENTIAL/PLATELET
BASOS ABS: 0 10*3/uL (ref 0.0–0.1)
Basophils Relative: 0.5 % (ref 0.0–3.0)
EOS PCT: 4.5 % (ref 0.0–5.0)
Eosinophils Absolute: 0.4 10*3/uL (ref 0.0–0.7)
HCT: 36.7 % (ref 36.0–46.0)
HEMOGLOBIN: 11.6 g/dL — AB (ref 12.0–15.0)
LYMPHS ABS: 2.5 10*3/uL (ref 0.7–4.0)
LYMPHS PCT: 31 % (ref 12.0–46.0)
MCHC: 31.7 g/dL (ref 30.0–36.0)
MCV: 87.6 fl (ref 78.0–100.0)
MONOS PCT: 6.2 % (ref 3.0–12.0)
Monocytes Absolute: 0.5 10*3/uL (ref 0.1–1.0)
NEUTROS ABS: 4.6 10*3/uL (ref 1.4–7.7)
Neutrophils Relative %: 57.8 % (ref 43.0–77.0)
Platelets: 248 10*3/uL (ref 150.0–400.0)
RBC: 4.19 Mil/uL (ref 3.87–5.11)
RDW: 15 % — AB (ref 11.5–14.6)
WBC: 8 10*3/uL (ref 4.5–10.5)

## 2013-06-15 LAB — COMPREHENSIVE METABOLIC PANEL
ALT: 9 U/L (ref 0–35)
AST: 18 U/L (ref 0–37)
Albumin: 3.7 g/dL (ref 3.5–5.2)
Alkaline Phosphatase: 77 U/L (ref 39–117)
BUN: 13 mg/dL (ref 6–23)
CALCIUM: 9.4 mg/dL (ref 8.4–10.5)
CO2: 28 meq/L (ref 19–32)
CREATININE: 1 mg/dL (ref 0.4–1.2)
Chloride: 101 mEq/L (ref 96–112)
GFR: 72.29 mL/min (ref >60.00)
Glucose, Bld: 113 mg/dL — ABNORMAL HIGH (ref 70–99)
Potassium: 4 mEq/L (ref 3.5–5.1)
Sodium: 137 mEq/L (ref 135–145)
Total Bilirubin: 0.4 mg/dL (ref 0.3–1.2)
Total Protein: 7.5 g/dL (ref 6.0–8.3)

## 2013-06-15 LAB — LIPID PANEL
Cholesterol: 169 mg/dL (ref 0–200)
HDL: 60.2 mg/dL (ref >39.00)
LDL Cholesterol: 73 mg/dL (ref 0–99)
Total CHOL/HDL Ratio: 3
Triglycerides: 180 mg/dL — ABNORMAL HIGH (ref 0.0–149.0)
VLDL: 36 mg/dL (ref 0.0–40.0)

## 2013-06-15 LAB — MAGNESIUM: MAGNESIUM: 1.7 mg/dL (ref 1.5–2.5)

## 2013-06-15 LAB — HEMOGLOBIN A1C: HEMOGLOBIN A1C: 6.9 % — AB (ref 4.6–6.5)

## 2013-06-16 ENCOUNTER — Ambulatory Visit: Payer: Medicare Other | Admitting: Podiatry

## 2013-06-23 ENCOUNTER — Telehealth: Payer: Self-pay | Admitting: Endocrinology

## 2013-06-23 ENCOUNTER — Other Ambulatory Visit: Payer: Self-pay | Admitting: *Deleted

## 2013-06-23 MED ORDER — FREESTYLE LANCETS MISC: Status: DC

## 2013-06-23 MED ORDER — GLUCOSE BLOOD VI STRP: ORAL_STRIP | Status: DC

## 2013-06-23 NOTE — Telephone Encounter (Signed)
Patient originally wanted rx [facility], she called to change that because pharmacy could not fill this, rx sent to [facility] per patient request

## 2013-06-23 NOTE — Telephone Encounter (Signed)
Pt states her supplies for meter was sent to wrong pharmacy  Please send to correct pharmacy  [facility] on [facility]   Call back: [phone]  Thank You:)

## 2013-06-28 ENCOUNTER — Encounter: Payer: Self-pay | Admitting: Podiatry

## 2013-06-28 ENCOUNTER — Ambulatory Visit (INDEPENDENT_AMBULATORY_CARE_PROVIDER_SITE_OTHER): Payer: Medicare Other | Admitting: Podiatry

## 2013-06-28 VITALS — BP 148/69 | HR 75 | Resp 19 | Ht 62.0 in | Wt 238.0 lb

## 2013-06-28 DIAGNOSIS — B351 Tinea unguium: Secondary | ICD-10-CM

## 2013-06-28 DIAGNOSIS — E1159 Type 2 diabetes mellitus with other circulatory complications: Secondary | ICD-10-CM

## 2013-06-28 DIAGNOSIS — M79609 Pain in unspecified limb: Secondary | ICD-10-CM

## 2013-06-28 DIAGNOSIS — Q828 Other specified congenital malformations of skin: Secondary | ICD-10-CM

## 2013-06-28 NOTE — Progress Notes (Signed)
   Subjective:    Patient ID: Angelica Benson, female    DOB: [birth year], 80 y.o.   MRN: [id number] Pt presents for debridement of B/L 1 - 5 toenails. HPI    Review of Systems     Objective:   Physical Exam        Assessment & Plan:

## 2013-06-30 NOTE — Progress Notes (Signed)
Subjective:     Patient ID: Angelica Benson, female   DOB: 11/30/1928, 80 y.o.   MRN: [id number]  HPI patient presents with nail disease 1-5 both feet that are thick and painful in keratotic lesion left which she cannot take care of herself   Review of Systems     Objective:   Physical Exam Neurovascular status unchanged with thick nailbeds that are painful when pressed 1-5 both feet and brittle and keratotic lesion sub-third metatarsal left    Assessment:     At risk diabetic with nail disease 1-5 both feet and keratotic lesion consistent with porokeratosis left    Plan:     Debridement of nailbeds 1-5 both feet with no bleeding and debridement of lesion left

## 2013-07-07 ENCOUNTER — Other Ambulatory Visit: Payer: Self-pay | Admitting: *Deleted

## 2013-07-07 MED ORDER — ONETOUCH ULTRA 2 W/DEVICE KIT: PACK | Status: DC

## 2013-08-11 ENCOUNTER — Other Ambulatory Visit (INDEPENDENT_AMBULATORY_CARE_PROVIDER_SITE_OTHER): Payer: PRIVATE HEALTH INSURANCE

## 2013-08-11 DIAGNOSIS — E1165 Type 2 diabetes mellitus with hyperglycemia: Principal | ICD-10-CM

## 2013-08-11 DIAGNOSIS — IMO0001 Reserved for inherently not codable concepts without codable children: Secondary | ICD-10-CM

## 2013-08-11 LAB — MICROALBUMIN / CREATININE URINE RATIO
Creatinine,U: 119.8 mg/dL
Microalb Creat Ratio: 0.3 mg/g (ref 0.0–30.0)
Microalb, Ur: 0.3 mg/dL (ref 0.0–1.9)

## 2013-08-11 LAB — URINALYSIS, ROUTINE W REFLEX MICROSCOPIC
BILIRUBIN URINE: NEGATIVE
HGB URINE DIPSTICK: NEGATIVE
Ketones, ur: NEGATIVE
LEUKOCYTES UA: NEGATIVE
Nitrite: NEGATIVE
PH: 5.5 (ref 5.0–8.0)
RBC / HPF: NONE SEEN (ref 0–?)
Specific Gravity, Urine: 1.02 (ref 1.000–1.030)
TOTAL PROTEIN, URINE-UPE24: NEGATIVE
Urine Glucose: NEGATIVE
Urobilinogen, UA: 0.2 (ref 0.0–1.0)

## 2013-08-18 ENCOUNTER — Encounter: Payer: Self-pay | Admitting: Endocrinology

## 2013-08-18 ENCOUNTER — Ambulatory Visit (INDEPENDENT_AMBULATORY_CARE_PROVIDER_SITE_OTHER): Payer: PRIVATE HEALTH INSURANCE | Admitting: Endocrinology

## 2013-08-18 VITALS — BP 124/60 | HR 92 | Temp 97.9°F | Resp 12 | Wt 231.0 lb

## 2013-08-18 DIAGNOSIS — E669 Obesity, unspecified: Secondary | ICD-10-CM | POA: Insufficient documentation

## 2013-08-18 DIAGNOSIS — E049 Nontoxic goiter, unspecified: Secondary | ICD-10-CM

## 2013-08-18 DIAGNOSIS — D638 Anemia in other chronic diseases classified elsewhere: Secondary | ICD-10-CM

## 2013-08-18 DIAGNOSIS — E119 Type 2 diabetes mellitus without complications: Secondary | ICD-10-CM

## 2013-08-18 DIAGNOSIS — I10 Essential (primary) hypertension: Secondary | ICD-10-CM

## 2013-08-18 NOTE — Progress Notes (Signed)
Patient ID: Angelica Benson, female   DOB: 02/19/1929, 80 y.o.   MRN: [id number]   Reason for Appointment : Followup for Type 2 Diabetes  History of Present Illness          Diagnosis: Type 2 diabetes mellitus, date of diagnosis: 1992       Past history: She is unclear about the date of her diagnosis and treatment history. At onset she had symptoms of frequent urination, weakness and sleepiness. She has been on various oral hypoglycemic drugs since her diagnosis. Has been on metformin since at least 2006 and Januvia since 2007. Also previously had taken glyburide and Avandia Her A1c has generally been in the 5.9-6.4 range previously  Recent history:  Her blood sugars appear to be fairly well controlled and her A1c recently was below 7% On the last visit she was her blood sugars primarily fasting and recently has done a couple of readings around lunchtime Blood sugars are mildly high fasting and reasonably good after lunch She also has lost a little weight since her last visit Oral hypoglycemic drugs the patient is taking are: Metformin 500 mg twice a day, Januvia      Side effects from medications have been: None  Glucose monitoring:  done one time a day         Glucometer: One Touch.      Blood Glucose readings from meter download:  PREMEAL Breakfast Lunch  PCL  Bedtime Overall  Glucose range:  105-135   102   131, 164     Median:      125    Hypoglycemia:   none  Glycemic control: Her A1c was 6.9 in 9/14  Lab Results  Component Value Date   HGBA1C 6.9* 06/14/2013   HGBA1C  Value: 6.2 (NOTE)                                                                       According to the ADA Clinical Practice Recommendations for 2011, when HbA1c is used as a screening test:   >=6.5%   Diagnostic of Diabetes Mellitus           (if abnormal result  is confirmed)  5.7-6.4%   Increased risk of developing Diabetes Mellitus  References:Diagnosis and Classification of Diabetes Mellitus,Diabetes  GGEZ,6629,47(MLYYT 1):S62-S69 and Standards of Medical Care in         Diabetes - 2011,Diabetes KPTW,6568,12  (Suppl 1):S11-S61.* 03/14/2010   Lab Results  Component Value Date   MICROALBUR 0.3 08/11/2013   LDLCALC 73 06/14/2013   CREATININE 1.0 06/14/2013    Self-care: The diet that the patient has been following is: tries to limit high-fat foods    Meals: 3 meals per day.  at breakfast has instant oatmeal and toast. At lunch will have sandwich and fruit; dinner is chicken, starch and vegetables; her snacks are fruit, juice, peanut butter or cheese         Exercise:    will walk around her living area, 3/7  Dietician visit: Most recent: 2000            Compliance with the medical regimen: Good Retinal exam: Most recent: 12/14.   Urine microalbumin was normal in 05/2012  Weight history:  Wt Readings from Last 3 Encounters:  08/18/13 231 lb (104.781 kg)  06/28/13 238 lb (107.956 kg)  06/14/13 235 lb 6.4 oz (106.777 kg)      Medication List       This list is accurate as of: 08/18/13 10:29 AM.  Always use your most recent med list.               acetaminophen 500 MG tablet  Commonly known as:  TYLENOL  Take 500 mg by mouth every 6 (six) hours as needed for pain.     aspirin 81 MG chewable tablet  Chew 81 mg by mouth daily.     COMBIVENT RESPIMAT 20-100 MCG/ACT Aers respimat  Generic drug:  Ipratropium-Albuterol  Inhale 1 puff into the lungs 2 (two) times daily.     fluticasone 50 MCG/ACT nasal spray  Commonly known as:  FLONASE  Place 2 sprays into the nose daily.     freestyle lancets  Use as instructed to check blood sugars once a day dx code 250.00     freestyle lancets     glucose blood test strip  Commonly known as:  FREESTYLE LITE  Use as instructed to check blood sugars daily dx code 250.00     hydrochlorothiazide 25 MG tablet  Commonly known as:  HYDRODIURIL  Take 25 mg by mouth daily.     hydrOXYzine 25 MG tablet  Commonly known as:  ATARAX/VISTARIL   Take 25 mg by mouth 3 (three) times daily as needed for itching.     metFORMIN 500 MG tablet  Commonly known as:  GLUCOPHAGE  Take 500 mg by mouth 2 (two) times daily with a meal.     ONE TOUCH ULTRA 2 W/DEVICE Kit  Use to check blood sugar daily dx code 250.00     potassium chloride SA 20 MEQ tablet  Commonly known as:  K-DUR,KLOR-CON  Take 20 mEq by mouth daily.     PROAIR HFA 108 (90 BASE) MCG/ACT inhaler  Generic drug:  albuterol  Inhale 2 puffs into the lungs every 6 (six) hours as needed.     sitaGLIPtin 100 MG tablet  Commonly known as:  JANUVIA  Take 100 mg by mouth daily.        Allergies:  Allergies  Allergen Reactions  . Morphine And Related Rash    Past Medical History  Diagnosis Date  . Arthritis   . Asthma   . CHF (congestive heart failure)   . Hypertension   . Diabetes mellitus without complication   . Chronic headaches   . Constipation   . Hemorrhoids without complication     Past Surgical History  Procedure Laterality Date  . Cholecystectomy    . Back surgery    . Knee surgery    . Abdominal hysterectomy      Family History  Problem Relation Age of Onset  . Diabetes Son   . Hypertension Son     Social History:  reports that she has never smoked. She does not have any smokeless tobacco history on file. She reports that she does not use illicit drugs. Her alcohol history is not on file.    Review of Systems       Lipids: Last LDL was 89, has never been on treatment     The blood pressure has been controlled with HCTZ alone      She has had chronic anemia with her last hemoglobin being 11.1.  Has not been on any  supplements for this, no recent vitamin B 12 level available   No history of  microvascular disease  Physical Examination:  BP 124/60  Pulse 92  Temp(Src) 97.9 F (36.6 C) (Oral)  Resp 12  Wt 231 lb (104.781 kg)  SpO2 98%    no lower leg edema present    ASSESSMENT/ PLAN:   Diabetes type 2, relatively mild and  well controlled especially considering her age Although her fasting blood sugars are mildly increased day are still relatively good with average about 124 Again reminded her to check more readings after meals but recent readings after lunch are looking fairly good She is also trying to be active and has lost some weight She was advised to have balanced meals including at breakfast on the last visit Currently taking low dose metformin and Januvia and will continue same regimen  Probable hypertension, Controlled with HCTZ.   Elayne Snare 08/18/2013, 10:29 AM

## 2013-08-18 NOTE — Patient Instructions (Signed)
Please check blood sugars at least half the time about 2 hours after any meal and 2x per week on waking up. Please bring blood sugar monitor to each visit

## 2013-10-02 ENCOUNTER — Other Ambulatory Visit: Payer: Self-pay | Admitting: Endocrinology

## 2013-10-04 ENCOUNTER — Ambulatory Visit (INDEPENDENT_AMBULATORY_CARE_PROVIDER_SITE_OTHER): Payer: Medicare Other | Admitting: Podiatry

## 2013-10-04 DIAGNOSIS — B351 Tinea unguium: Secondary | ICD-10-CM

## 2013-10-04 DIAGNOSIS — M79609 Pain in unspecified limb: Secondary | ICD-10-CM

## 2013-10-04 DIAGNOSIS — M79673 Pain in unspecified foot: Secondary | ICD-10-CM

## 2013-10-04 NOTE — Progress Notes (Signed)
Subjective:     Patient ID: Angelica Benson, female   DOB: 1929/04/06, 80 y.o.   MRN: [id number]  HPI patient presents stating I need my nails cut they are thick and they are pain   Review of Systems     Objective:   Physical Exam Neurovascular status intact with thick yellow brittle nailbeds 1-5 both feet    Assessment:     Mycotic nail infection with pain 1-5 both feet    Plan:     Debris painful nailbeds 1-5 both feet with no iatrogenic bleeding noted

## 2013-10-04 NOTE — Progress Notes (Signed)
   Subjective:    Patient ID: Angelica Benson, female    DOB: 1928-06-24, 80 y.o.   MRN: [id number]  HPI Routine nail debride. No other issues at this time   Review of Systems     Objective:   Physical Exam        Assessment & Plan:

## 2013-11-15 ENCOUNTER — Other Ambulatory Visit (INDEPENDENT_AMBULATORY_CARE_PROVIDER_SITE_OTHER): Payer: PRIVATE HEALTH INSURANCE

## 2013-11-15 DIAGNOSIS — E119 Type 2 diabetes mellitus without complications: Secondary | ICD-10-CM

## 2013-11-15 LAB — LIPID PANEL
CHOLESTEROL: 148 mg/dL (ref 0–200)
HDL: 52.1 mg/dL (ref >39.00)
LDL Cholesterol: 69 mg/dL (ref 0–99)
NonHDL: 95.9
TRIGLYCERIDES: 133 mg/dL (ref 0.0–149.0)
Total CHOL/HDL Ratio: 3
VLDL: 26.6 mg/dL (ref 0.0–40.0)

## 2013-11-15 LAB — MICROALBUMIN / CREATININE URINE RATIO
CREATININE, U: 53.9 mg/dL
Microalb Creat Ratio: 0.4 mg/g (ref 0.0–30.0)
Microalb, Ur: 0.2 mg/dL (ref 0.0–1.9)

## 2013-11-15 LAB — URINALYSIS, ROUTINE W REFLEX MICROSCOPIC
Bilirubin Urine: NEGATIVE
Hgb urine dipstick: NEGATIVE
Ketones, ur: NEGATIVE
LEUKOCYTES UA: NEGATIVE
NITRITE: NEGATIVE
PH: 7 (ref 5.0–8.0)
RBC / HPF: NONE SEEN (ref 0–?)
Specific Gravity, Urine: 1.01 (ref 1.000–1.030)
Total Protein, Urine: NEGATIVE
UROBILINOGEN UA: 0.2 (ref 0.0–1.0)
Urine Glucose: NEGATIVE
WBC, UA: NONE SEEN (ref 0–?)

## 2013-11-15 LAB — COMPREHENSIVE METABOLIC PANEL
ALBUMIN: 3.4 g/dL — AB (ref 3.5–5.2)
ALT: 10 U/L (ref 0–35)
AST: 17 U/L (ref 0–37)
Alkaline Phosphatase: 61 U/L (ref 39–117)
BUN: 20 mg/dL (ref 6–23)
CALCIUM: 9.6 mg/dL (ref 8.4–10.5)
CHLORIDE: 98 meq/L (ref 96–112)
CO2: 30 mEq/L (ref 19–32)
CREATININE: 1 mg/dL (ref 0.4–1.2)
GFR: 68.86 mL/min (ref >60.00)
GLUCOSE: 106 mg/dL — AB (ref 70–99)
POTASSIUM: 3.9 meq/L (ref 3.5–5.1)
Sodium: 134 mEq/L — ABNORMAL LOW (ref 135–145)
Total Bilirubin: 0.3 mg/dL (ref 0.2–1.2)
Total Protein: 7.3 g/dL (ref 6.0–8.3)

## 2013-11-15 LAB — HEMOGLOBIN A1C: Hgb A1c MFr Bld: 6.9 % — ABNORMAL HIGH (ref 4.6–6.5)

## 2013-11-15 LAB — TSH: TSH: 1.62 u[IU]/mL (ref 0.35–4.50)

## 2013-11-18 ENCOUNTER — Encounter: Payer: Self-pay | Admitting: Endocrinology

## 2013-11-18 ENCOUNTER — Ambulatory Visit (INDEPENDENT_AMBULATORY_CARE_PROVIDER_SITE_OTHER): Payer: PRIVATE HEALTH INSURANCE | Admitting: Endocrinology

## 2013-11-18 VITALS — BP 127/54 | HR 73 | Temp 98.6°F | Resp 16 | Ht 62.0 in | Wt 232.2 lb

## 2013-11-18 DIAGNOSIS — R55 Syncope and collapse: Secondary | ICD-10-CM

## 2013-11-18 DIAGNOSIS — I10 Essential (primary) hypertension: Secondary | ICD-10-CM

## 2013-11-18 DIAGNOSIS — E119 Type 2 diabetes mellitus without complications: Secondary | ICD-10-CM

## 2013-11-18 LAB — GLUCOSE, POCT (MANUAL RESULT ENTRY): POC GLUCOSE: 182 mg/dL — AB (ref 70–99)

## 2013-11-18 NOTE — Progress Notes (Signed)
Patient ID: Angelica Benson, female   DOB: Feb 14, 1929, 80 y.o.   MRN: [id number]   Reason for Appointment : Followup   History of Present Illness          Diagnosis: Type 2 diabetes mellitus, date of diagnosis: 1992       Past history: She is unclear about the date of her diagnosis and treatment history. At onset she had symptoms of frequent urination, weakness and sleepiness. She has been on various oral hypoglycemic drugs since her diagnosis. Has been on metformin since at least 2006 and Januvia since 2007. Also previously had taken glyburide and Avandia Her A1c has generally been in the 5.9-6.4 range previously  Recent history:  She still continues to take Januvia and metformin without any side effects Her blood sugars appear to be fairly well controlled and her A1c is again below 7% She did not bring her monitor her for review today but she thinks they are mostly near normal Not clear if she is doing enough readings after meals Overall she thinks she is watching her diet well but has not lost any more weight Not able to do much exercise but is trying to walk about a quarter mile around her home  Oral hypoglycemic drugs the patient is taking are: Metformin 500 mg twice a day, Januvia      Side effects from medications have been: None  Glucose monitoring:  done ? one time a day         Glucometer: One Touch.      Blood Glucose readings from : 98-130  Hypoglycemia:   none  Glycemic control: Her A1c was 6.9 in 9/14  Lab Results  Component Value Date   HGBA1C 6.9* 11/15/2013   HGBA1C 6.9* 06/14/2013   HGBA1C  Value: 6.2 (NOTE)                                                                       According to the ADA Clinical Practice Recommendations for 2011, when HbA1c is used as a screening test:   >=6.5%   Diagnostic of Diabetes Mellitus           (if abnormal result  is confirmed)  5.7-6.4%   Increased risk of developing Diabetes Mellitus  References:Diagnosis and Classification of  Diabetes Mellitus,Diabetes EBRA,3094,07(WKGSU 1):S62-S69 and Standards of Medical Care in         Diabetes - 2011,Diabetes PJSR,1594,58  (Suppl 1):S11-S61.* 03/14/2010   Lab Results  Component Value Date   MICROALBUR 0.2 11/15/2013   LDLCALC 69 11/15/2013   CREATININE 1.0 11/15/2013    Self-care: The diet that the patient has been following is: tries to limit high-fat foods    Meals: 3 meals per day.  at breakfast has instant oatmeal and toast. At lunch will have sandwich and fruit; dinner is chicken, starch and vegetables; her snacks are fruit, juice, peanut butter or cheese         Exercise:    will walk around her living area, up to 5 days a week Dietician visit: Most recent: 2000            Compliance with the medical regimen: Good Retinal exam: Most recent: 12/14.   Urine microalbumin was normal  in 05/2012  Weight history:  Wt Readings from Last 3 Encounters:  11/18/13 232 lb 3.2 oz (105.325 kg)  08/18/13 231 lb (104.781 kg)  06/28/13 238 lb (107.956 kg)      Medication List       This list is accurate as of: 11/18/13 11:17 AM.  Always use your most recent med list.               acetaminophen 500 MG tablet  Commonly known as:  TYLENOL  Take 500 mg by mouth every 6 (six) hours as needed for pain.     aspirin 81 MG chewable tablet  Chew 81 mg by mouth daily.     COMBIVENT RESPIMAT 20-100 MCG/ACT Aers respimat  Generic drug:  Ipratropium-Albuterol  Inhale 1 puff into the lungs 2 (two) times daily.     Diclofenac Sodium 3 % Gel     ergocalciferol 50000 UNITS capsule  Commonly known as:  VITAMIN D2  Take 50,000 Units by mouth once a week.     fluticasone 50 MCG/ACT nasal spray  Commonly known as:  FLONASE  Place 2 sprays into the nose daily.     freestyle lancets  Use as instructed to check blood sugars once a day dx code 250.00     freestyle lancets     ONETOUCH DELICA LANCETS 78E Misc  USE AS INSTRUCTED TO CHECK BLOOD SUGAR EVERY DAY     gabapentin 100 MG  capsule  Commonly known as:  NEURONTIN     hydrochlorothiazide 25 MG tablet  Commonly known as:  HYDRODIURIL  Take 25 mg by mouth daily.     hydrOXYzine 25 MG tablet  Commonly known as:  ATARAX/VISTARIL  Take 25 mg by mouth 3 (three) times daily as needed for itching.     metFORMIN 500 MG tablet  Commonly known as:  GLUCOPHAGE  Take 500 mg by mouth 2 (two) times daily with a meal.     ONE TOUCH ULTRA 2 W/DEVICE Kit  Use to check blood sugar daily dx code 250.00     ONE TOUCH ULTRA TEST test strip  Generic drug:  glucose blood  CHECK BLOOD SUGAR DAILY AS DIRECTED.     potassium chloride SA 20 MEQ tablet  Commonly known as:  K-DUR,KLOR-CON  Take 20 mEq by mouth daily.     PROAIR HFA 108 (90 BASE) MCG/ACT inhaler  Generic drug:  albuterol  Inhale 2 puffs into the lungs every 6 (six) hours as needed.     sitaGLIPtin 100 MG tablet  Commonly known as:  JANUVIA  Take 100 mg by mouth daily.     VITAMIN B-12 IJ  Inject as directed.        Allergies:  Allergies  Allergen Reactions  . Morphine And Related Rash    Past Medical History  Diagnosis Date  . Arthritis   . Asthma   . CHF (congestive heart failure)   . Hypertension   . Diabetes mellitus without complication   . Chronic headaches   . Constipation   . Hemorrhoids without complication     Past Surgical History  Procedure Laterality Date  . Cholecystectomy    . Back surgery    . Knee surgery    . Abdominal hysterectomy      Family History  Problem Relation Age of Onset  . Diabetes Son   . Hypertension Son     Social History:  reports that she has never smoked. She does not have  any smokeless tobacco history on file. She reports that she does not use illicit drugs. Her alcohol history is not on file.    Review of Systems   She is asking about an episode of passing out at church on Sunday. This was transient and not associated with other symptoms. She had not eaten that morning      Lipids:  has  never been on treatment, levels are normal without statin drugs  Lab Results  Component Value Date   CHOL 148 11/15/2013   HDL 52.10 11/15/2013   LDLCALC 69 11/15/2013   TRIG 133.0 11/15/2013   CHOLHDL 3 11/15/2013       The blood pressure has been controlled with HCTZ alone      She has had chronic anemia.  Has not been on any supplements for this,  Recently on vitamin B 12 from her PCP  Lab Results  Component Value Date   WBC 8.0 06/14/2013   HGB 11.6* 06/14/2013   HCT 36.7 06/14/2013   MCV 87.6 06/14/2013   PLT 248.0 06/14/2013    No history of coronary artery disease or CVA  No numbness in feet. She has been wearing diabetic shoes and not clear why. No previous history of neuropathy  She has a history of a small goiter in the past  She will occasionally have swelling of her ankles  Physical Examination:  BP 127/54  Pulse 73  Temp(Src) 98.6 F (37 C)  Resp 16  Ht 5' 2"  (1.575 m)  Wt 232 lb 3.2 oz (105.325 kg)  BMI 42.46 kg/m2  SpO2 96%    130/68 standing Heart rate regular  Diabetic foot exam shows  Inconsistent decrease sensation in toes left but not plantar surface, not clear if she has a reliable understanding, no skin lesions or ulcers on the feet and normal pedal pulses No edema    ASSESSMENT/ PLAN:   Episode of syncope: No etiology evident. She will followup with PCP   Diabetes type 2 with obesity, relatively mild and well controlled especially considering her age Although her home readings were not reviewed she thinks they are mostly normal A1c is excellent for her age at 6.9% again She is also trying to be as active as possible  She was advised to watch fat intake Currently taking low dose metformin and Januvia and will continue same regimen  Probable hypertension, Controlled with HCTZ.  No evidence of neuropathy in her exam. Discussed that Medicare does not cover diabetic shoes unless she meets the criteria   Bodi Palmeri 11/18/2013, 11:17 AM

## 2013-11-26 ENCOUNTER — Other Ambulatory Visit: Payer: Self-pay | Admitting: Endocrinology

## 2013-11-30 ENCOUNTER — Emergency Department (HOSPITAL_COMMUNITY): Payer: PRIVATE HEALTH INSURANCE

## 2013-11-30 ENCOUNTER — Encounter (HOSPITAL_COMMUNITY): Payer: Self-pay | Admitting: Emergency Medicine

## 2013-11-30 ENCOUNTER — Emergency Department (HOSPITAL_COMMUNITY)
Admission: EM | Admit: 2013-11-30 | Discharge: 2013-12-01 | Disposition: A | Discharge status: Home / Self Care | Payer: PRIVATE HEALTH INSURANCE | Attending: Emergency Medicine | Admitting: Emergency Medicine

## 2013-11-30 DIAGNOSIS — Z791 Long term (current) use of non-steroidal anti-inflammatories (NSAID): Secondary | ICD-10-CM | POA: Insufficient documentation

## 2013-11-30 DIAGNOSIS — I509 Heart failure, unspecified: Secondary | ICD-10-CM | POA: Diagnosis not present

## 2013-11-30 DIAGNOSIS — I10 Essential (primary) hypertension: Secondary | ICD-10-CM | POA: Diagnosis not present

## 2013-11-30 DIAGNOSIS — R609 Edema, unspecified: Secondary | ICD-10-CM | POA: Diagnosis not present

## 2013-11-30 DIAGNOSIS — Z79899 Other long term (current) drug therapy: Secondary | ICD-10-CM | POA: Insufficient documentation

## 2013-11-30 DIAGNOSIS — E119 Type 2 diabetes mellitus without complications: Secondary | ICD-10-CM | POA: Diagnosis not present

## 2013-11-30 DIAGNOSIS — M129 Arthropathy, unspecified: Secondary | ICD-10-CM | POA: Diagnosis not present

## 2013-11-30 DIAGNOSIS — J45901 Unspecified asthma with (acute) exacerbation: Secondary | ICD-10-CM

## 2013-11-30 DIAGNOSIS — R51 Headache: Secondary | ICD-10-CM | POA: Insufficient documentation

## 2013-11-30 DIAGNOSIS — IMO0002 Reserved for concepts with insufficient information to code with codable children: Secondary | ICD-10-CM | POA: Insufficient documentation

## 2013-11-30 DIAGNOSIS — R0602 Shortness of breath: Secondary | ICD-10-CM | POA: Diagnosis present

## 2013-11-30 DIAGNOSIS — Z7982 Long term (current) use of aspirin: Secondary | ICD-10-CM | POA: Diagnosis not present

## 2013-11-30 DIAGNOSIS — K5900 Constipation, unspecified: Secondary | ICD-10-CM | POA: Insufficient documentation

## 2013-11-30 LAB — BASIC METABOLIC PANEL
ANION GAP: 18 — AB (ref 5–15)
BUN: 17 mg/dL (ref 6–23)
CO2: 21 meq/L (ref 19–32)
CREATININE: 0.99 mg/dL (ref 0.50–1.10)
Calcium: 9.7 mg/dL (ref 8.4–10.5)
Chloride: 98 mEq/L (ref 96–112)
GFR calc Af Amer: 59 mL/min — ABNORMAL LOW (ref >90)
GFR calc non Af Amer: 51 mL/min — ABNORMAL LOW (ref >90)
Glucose, Bld: 180 mg/dL — ABNORMAL HIGH (ref 70–99)
POTASSIUM: 3.8 meq/L (ref 3.7–5.3)
Sodium: 137 mEq/L (ref 137–147)

## 2013-11-30 LAB — CBC WITH DIFFERENTIAL/PLATELET
BASOS ABS: 0 10*3/uL (ref 0.0–0.1)
BASOS PCT: 0 % (ref 0–1)
Eosinophils Absolute: 0.2 10*3/uL (ref 0.0–0.7)
Eosinophils Relative: 2 % (ref 0–5)
HCT: 33.1 % — ABNORMAL LOW (ref 36.0–46.0)
Hemoglobin: 10.8 g/dL — ABNORMAL LOW (ref 12.0–15.0)
LYMPHS PCT: 9 % — AB (ref 12–46)
Lymphs Abs: 0.9 10*3/uL (ref 0.7–4.0)
MCH: 28.7 pg (ref 26.0–34.0)
MCHC: 32.6 g/dL (ref 30.0–36.0)
MCV: 88 fL (ref 78.0–100.0)
MONO ABS: 0.2 10*3/uL (ref 0.1–1.0)
MONOS PCT: 3 % (ref 3–12)
NEUTROS ABS: 8.1 10*3/uL — AB (ref 1.7–7.7)
NEUTROS PCT: 86 % — AB (ref 43–77)
Platelets: 189 10*3/uL (ref 150–400)
RBC: 3.76 MIL/uL — ABNORMAL LOW (ref 3.87–5.11)
RDW: 14.9 % (ref 11.5–15.5)
WBC: 9.4 10*3/uL (ref 4.0–10.5)

## 2013-11-30 LAB — PRO B NATRIURETIC PEPTIDE: Pro B Natriuretic peptide (BNP): 431.4 pg/mL (ref 0–450)

## 2013-11-30 LAB — TROPONIN I: Troponin I: <0.3 ng/mL (ref <0.30)

## 2013-11-30 MED ORDER — IPRATROPIUM BROMIDE 0.02 % IN SOLN
0.5000 mg | Freq: Once | RESPIRATORY_TRACT | Status: AC
  Administered 2013-11-30: 0.5 mg via RESPIRATORY_TRACT
  Filled 2013-11-30: qty 2.5

## 2013-11-30 MED ORDER — ALBUTEROL SULFATE (2.5 MG/3ML) 0.083% IN NEBU
5.0000 mg | INHALATION_SOLUTION | Freq: Once | RESPIRATORY_TRACT | Status: AC
  Administered 2013-11-30: 5 mg via RESPIRATORY_TRACT
  Filled 2013-11-30: qty 6

## 2013-11-30 NOTE — ED Provider Notes (Signed)
CSN: [id number]     Arrival date & time 11/30/13  2009 History   First MD Initiated Contact with Patient 11/30/13 2033     Chief Complaint  Patient presents with  . Respiratory Distress  . Shortness of Breath     (Consider location/radiation/quality/duration/timing/severity/associated sxs/prior Treatment) Patient is a 80 y.o. female presenting with shortness of breath.  Shortness of Breath Severity:  Severe Onset quality:  Gradual Duration:  3 days Timing:  Constant Progression:  Worsening Chronicity:  Recurrent Context comment:  Hx of copd Relieved by:  Oxygen (nebs) Associated symptoms: cough, sputum production (yellow) and wheezing   Associated symptoms: no abdominal pain and no chest pain     Past Medical History  Diagnosis Date  . Arthritis   . Asthma   . CHF (congestive heart failure)   . Hypertension   . Diabetes mellitus without complication   . Chronic headaches   . Constipation   . Hemorrhoids without complication    Past Surgical History  Procedure Laterality Date  . Cholecystectomy    . Back surgery    . Knee surgery    . Abdominal hysterectomy     Family History  Problem Relation Age of Onset  . Diabetes Son   . Hypertension Son    History  Substance Use Topics  . Smoking status: Never Smoker   . Smokeless tobacco: Never Used  . Alcohol Use: No   OB History   Grav Para Term Preterm Abortions TAB SAB Ect Mult Living                 Review of Systems  Respiratory: Positive for cough, sputum production (yellow), shortness of breath and wheezing.   Cardiovascular: Negative for chest pain.  Gastrointestinal: Negative for abdominal pain.  All other systems reviewed and are negative.     Allergies  Morphine and related  Home Medications   Prior to Admission medications   Medication Sig Start Date End Date Taking? Authorizing Provider  acetaminophen (TYLENOL) 500 MG tablet Take 500 mg by mouth every 6 (six) hours as needed for pain.    Yes Historical Provider, MD  albuterol (PROVENTIL HFA;VENTOLIN HFA) 108 (90 BASE) MCG/ACT inhaler Inhale 2 puffs into the lungs every 6 (six) hours as needed for wheezing or shortness of breath.   Yes Historical Provider, MD  aspirin 81 MG chewable tablet Chew 81 mg by mouth daily.   Yes Historical Provider, MD  COMBIVENT RESPIMAT 20-100 MCG/ACT AERS respimat Inhale 1 puff into the lungs 2 (two) times daily. 12/23/12  Yes Historical Provider, MD  Cyanocobalamin (VITAMIN B-12 IJ) Inject 1 mL as directed every 30 (thirty) days.    Yes Historical Provider, MD  diclofenac sodium (VOLTAREN) 1 % GEL Apply 4 g topically 4 (four) times daily.   Yes Historical Provider, MD  ergocalciferol (VITAMIN D2) 50000 UNITS capsule Take 50,000 Units by mouth 2 (two) times a week.   Yes Historical Provider, MD  fluticasone (FLONASE) 50 MCG/ACT nasal spray Place 2 sprays into the nose daily. 02/07/13  Yes Blanchie Dessert, MD  gabapentin (NEURONTIN) 100 MG capsule Take 100 mg by mouth 2 (two) times daily.  10/28/13  Yes Historical Provider, MD  hydrochlorothiazide (HYDRODIURIL) 25 MG tablet Take 25 mg by mouth daily.   Yes Historical Provider, MD  hydrOXYzine (ATARAX/VISTARIL) 25 MG tablet Take 25 mg by mouth 3 (three) times daily as needed for itching.   Yes Historical Provider, MD  metFORMIN (GLUCOPHAGE) 500 MG tablet Take  500 mg by mouth 2 (two) times daily with a meal.   Yes Historical Provider, MD  potassium chloride SA (K-DUR,KLOR-CON) 20 MEQ tablet Take 20 mEq by mouth daily.   Yes Historical Provider, MD  sitaGLIPtin (JANUVIA) 100 MG tablet Take 100 mg by mouth daily.   Yes Historical Provider, MD  Blood Glucose Monitoring Suppl (ONE TOUCH ULTRA 2) W/DEVICE KIT Use to check blood sugar daily dx code 250.00 07/07/13   Elayne Snare, MD  Lancets (FREESTYLE) lancets Use as instructed to check blood sugars once a day dx code 250.00 06/23/13   Elayne Snare, MD  Lancets (FREESTYLE) lancets  06/28/13   Historical Provider, MD   ONE TOUCH ULTRA TEST test strip CHECK BLOOD SUGAR DAILY AS DIRECTED.    Elayne Snare, MD  [facility] DELICA LANCETS 69C MISC USE AS INSTRUCTED TO CHECK BLOOD SUGAR EVERY DAY    Elayne Snare, MD   SpO2 100% Physical Exam  Nursing note and vitals reviewed. Constitutional: She is oriented to person, place, and time. She appears well-developed and well-nourished. No distress.  HENT:  Head: Normocephalic and atraumatic.  Mouth/Throat: Oropharynx is clear and moist.  Eyes: Conjunctivae are normal. Pupils are equal, round, and reactive to light. No scleral icterus.  Neck: Neck supple.  Cardiovascular: Normal rate, regular rhythm, normal heart sounds and intact distal pulses.   No murmur heard. Pulmonary/Chest: Effort normal. No stridor. No respiratory distress. She has wheezes (diffuse). She has no rales.  Abdominal: Soft. Bowel sounds are normal. She exhibits no distension. There is no tenderness.  Musculoskeletal: Normal range of motion. She exhibits edema (1+).  Neurological: She is alert and oriented to person, place, and time.  Skin: Skin is warm and dry. No rash noted.  Psychiatric: She has a normal mood and affect. Her behavior is normal.    ED Course  Procedures (including critical care time) Labs Review Labs Reviewed  CBC WITH DIFFERENTIAL - Abnormal; Notable for the following:    RBC 3.76 (*)    Hemoglobin 10.8 (*)    HCT 33.1 (*)    Neutrophils Relative % 86 (*)    Neutro Abs 8.1 (*)    Lymphocytes Relative 9 (*)    All other components within normal limits  BASIC METABOLIC PANEL - Abnormal; Notable for the following:    Glucose, Bld 180 (*)    GFR calc non Af Amer 51 (*)    GFR calc Af Amer 59 (*)    Anion gap 18 (*)    All other components within normal limits  TROPONIN I  PRO B NATRIURETIC PEPTIDE    Imaging Review Dg Chest Port 1 View  11/30/2013   CLINICAL DATA:  Shortness of breath  EXAM: PORTABLE CHEST - 1 VIEW  COMPARISON:  03/16/2010  FINDINGS: No cardiomegaly  given technique. Upper mediastinal contours are distorted from rightward rotation but likely stable from previous. There is no edema, consolidation, effusion, or pneumothorax.  IMPRESSION: Negative for edema or pneumonia.   Electronically Signed   By: Jorje Guild M.D.   On: 11/30/2013 21:59  All radiology studies independently viewed by me.      EKG Interpretation   Date/Time:  Tuesday November 30 2013 20:14:25 EDT Ventricular Rate:  88 PR Interval:  174 QRS Duration: 150 QT Interval:  404 QTC Calculation: 489 R Axis:   -88 Text Interpretation:  Sinus rhythm RBBB and LAFB No significant change was  found Confirmed by [facility]  MD, TREY ([id number]) on 11/30/2013 9:16:05  PM      MDM   Final diagnoses:  Acute asthma exacerbation, unspecified asthma severity    80 yo female with hx of asthma presenting with SOB.  Given albuterol, solumedrol, and atrovent by EMS which she reported made her feel much better.  Exam consistent with exacerbation of reactive airway disease.  Given a second treatment here and monitored for several hours.  She reported that she felt back to her normal self.  Ambulated without hypoxia or SOB.  Remainder of workup unremarkable.  Repeat exam showed no increase WOB, good air movement in both lungs, mild wheezing.  She preferred to be dc'd home to try outpatient treatment.  Advised close PCP follow up.  Given return precautions.      Houston Siren III, MD 12/01/13 [phone]

## 2013-11-30 NOTE — ED Notes (Signed)
[facility] presents with a 80 yo female from home with respiratory distress and SOB.  Pt states she has been SOB since Sunday and has become increasingly more SOB.  Pt has had coughing with yellowish sputum production, sneezing and frequent urination since Sunday.  No chest pain reported.  Pt received 0.5 mg of Atrovent, 10 mg of albuterol and 125 mg of Solumedrol from [facility].

## 2013-12-01 MED ORDER — PREDNISONE 10 MG PO TABS
50.0000 mg | ORAL_TABLET | Freq: Every day | ORAL | Status: DC
Start: 2013-12-02 — End: 2014-03-21

## 2013-12-01 MED ORDER — AZITHROMYCIN 250 MG PO TABS: 250.0000 mg | ORAL_TABLET | Freq: Every day | ORAL | Status: DC

## 2013-12-01 NOTE — Discharge Instructions (Signed)
Monitor your blood sugar closely for the next several days. Use you albuterol inhaler every 4 hours for the next 36 hours.    Asthma Asthma is a recurring condition in which the airways tighten and narrow. Asthma can make it difficult to breathe. It can cause coughing, wheezing, and shortness of breath. Asthma episodes, also called asthma attacks, range from minor to life-threatening. Asthma cannot be cured, but medicines and lifestyle changes can help control it. CAUSES Asthma is believed to be caused by inherited (genetic) and environmental factors, but its exact cause is unknown. Asthma may be triggered by allergens, lung infections, or irritants in the air. Asthma triggers are different for each person. Common triggers include:   Animal dander.  Dust mites.  Cockroaches.  Pollen from trees or grass.  Mold.  Smoke.  Air pollutants such as dust, household cleaners, hair sprays, aerosol sprays, paint fumes, strong chemicals, or strong odors.  Cold air, weather changes, and winds (which increase molds and pollens in the air).  Strong emotional expressions such as crying or laughing hard.  Stress.  Certain medicines (such as aspirin) or types of drugs (such as beta-blockers).  Sulfites in foods and drinks. Foods and drinks that may contain sulfites include dried fruit, potato chips, and sparkling grape juice.  Infections or inflammatory conditions such as the flu, a cold, or an inflammation of the nasal membranes (rhinitis).  Gastroesophageal reflux disease (GERD).  Exercise or strenuous activity. SYMPTOMS Symptoms may occur immediately after asthma is triggered or many hours later. Symptoms include:  Wheezing.  Excessive nighttime or early morning coughing.  Frequent or severe coughing with a common cold.  Chest tightness.  Shortness of breath. DIAGNOSIS  The diagnosis of asthma is made by a review of your medical history and a physical exam. Tests may also be  performed. These may include:  Lung function studies. These tests show how much air you breathe in and out.  Allergy tests.  Imaging tests such as X-rays. TREATMENT  Asthma cannot be cured, but it can usually be controlled. Treatment involves identifying and avoiding your asthma triggers. It also involves medicines. There are 2 classes of medicine used for asthma treatment:   Controller medicines. These prevent asthma symptoms from occurring. They are usually taken every day.  Reliever or rescue medicines. These quickly relieve asthma symptoms. They are used as needed and provide short-term relief. Your health care provider will help you create an asthma action plan. An asthma action plan is a written plan for managing and treating your asthma attacks. It includes a list of your asthma triggers and how they may be avoided. It also includes information on when medicines should be taken and when their dosage should be changed. An action plan may also involve the use of a device called a peak flow meter. A peak flow meter measures how well the lungs are working. It helps you monitor your condition. HOME CARE INSTRUCTIONS   Take medicines only as directed by your health care provider. Speak with your health care provider if you have questions about how or when to take the medicines.  Use a peak flow meter as directed by your health care provider. Record and keep track of readings.  Understand and use the action plan to help minimize or stop an asthma attack without needing to seek medical care.  Control your home environment in the following ways to help prevent asthma attacks:  Do not smoke. Avoid being exposed to secondhand smoke.  Change  your heating and air conditioning filter regularly.  Limit your use of fireplaces and wood stoves.  Get rid of pests (such as roaches and mice) and their droppings.  Throw away plants if you see mold on them.  Clean your floors and dust regularly.  Use unscented cleaning products.  Try to have someone else vacuum for you regularly. Stay out of rooms while they are being vacuumed and for a short while afterward. If you vacuum, use a dust mask from a hardware store, a double-layered or microfilter vacuum cleaner bag, or a vacuum cleaner with a HEPA filter.  Replace carpet with wood, tile, or vinyl flooring. Carpet can trap dander and dust.  Use allergy-proof pillows, mattress covers, and box spring covers.  Wash bed sheets and blankets every week in hot water and dry them in a dryer.  Use blankets that are made of polyester or cotton.  Clean bathrooms and kitchens with bleach. If possible, have someone repaint the walls in these rooms with mold-resistant paint. Keep out of the rooms that are being cleaned and painted.  Wash hands frequently. SEEK MEDICAL CARE IF:   You have wheezing, shortness of breath, or a cough even if taking medicine to prevent attacks.  The colored mucus you cough up (sputum) is thicker than usual.  Your sputum changes from clear or white to yellow, green, gray, or bloody.  You have any problems that may be related to the medicines you are taking (such as a rash, itching, swelling, or trouble breathing).  You are using a reliever medicine more than 2-3 times per week.  Your peak flow is still at 50-79% of your personal best after following your action plan for 1 hour.  You have a fever. SEEK IMMEDIATE MEDICAL CARE IF:   You seem to be getting worse and are unresponsive to treatment during an asthma attack.  You are short of breath even at rest.  You get short of breath when doing very little physical activity.  You have difficulty eating, drinking, or talking due to asthma symptoms.  You develop chest pain.  You develop a fast heartbeat.  You have a bluish color to your lips or fingernails.  You are light-headed, dizzy, or faint.  Your peak flow is less than 50% of your personal best. MAKE  SURE YOU:   Understand these instructions.  Will watch your condition.  Will get help right away if you are not doing well or get worse. Document Released: 04/01/2005 Document Revised: 08/16/2013 Document Reviewed: 10/29/2012 [facility] Patient Information 2015 [place], [place]. This information is not intended to replace advice given to you by your health care provider. Make sure you discuss any questions you have with your health care provider.

## 2013-12-06 ENCOUNTER — Ambulatory Visit (INDEPENDENT_AMBULATORY_CARE_PROVIDER_SITE_OTHER): Payer: Medicare Other | Admitting: Podiatry

## 2013-12-06 DIAGNOSIS — B351 Tinea unguium: Secondary | ICD-10-CM

## 2013-12-06 DIAGNOSIS — M79673 Pain in unspecified foot: Secondary | ICD-10-CM

## 2013-12-06 DIAGNOSIS — M79609 Pain in unspecified limb: Secondary | ICD-10-CM

## 2013-12-06 NOTE — Progress Notes (Signed)
   Subjective:    Patient ID: Angelica Benson, female    DOB: Jan 08, 1929, 80 y.o.   MRN: [id number]  HPI Pt presents for debridement of nails   Review of Systems     Objective:   Physical Exam        Assessment & Plan:

## 2013-12-06 NOTE — Patient Instructions (Signed)
Diabetes and Foot Care Diabetes may cause you to have problems because of poor blood supply (circulation) to your feet and legs. This may cause the skin on your feet to become thinner, break easier, and heal more slowly. Your skin may become dry, and the skin may peel and crack. You may also have nerve damage in your legs and feet causing decreased feeling in them. You may not notice minor injuries to your feet that could lead to infections or more serious problems. Taking care of your feet is one of the most important things you can do for yourself.  HOME CARE INSTRUCTIONS  Wear shoes at all times, even in the house. Do not go barefoot. Bare feet are easily injured.  Check your feet daily for blisters, cuts, and redness. If you cannot see the bottom of your feet, use a mirror or ask someone for help.  Wash your feet with warm water (do not use hot water) and mild soap. Then pat your feet and the areas between your toes until they are completely dry. Do not soak your feet as this can dry your skin.  Apply a moisturizing lotion or petroleum jelly (that does not contain alcohol and is unscented) to the skin on your feet and to dry, brittle toenails. Do not apply lotion between your toes.  Trim your toenails straight across. Do not dig under them or around the cuticle. File the edges of your nails with an emery board or nail file.  Do not cut corns or calluses or try to remove them with medicine.  Wear clean socks or stockings every day. Make sure they are not too tight. Do not wear knee-high stockings since they may decrease blood flow to your legs.  Wear shoes that fit properly and have enough cushioning. To break in new shoes, wear them for just a few hours a day. This prevents you from injuring your feet. Always look in your shoes before you put them on to be sure there are no objects inside.  Do not cross your legs. This may decrease the blood flow to your feet.  If you find a minor scrape,  cut, or break in the skin on your feet, keep it and the skin around it clean and dry. These areas may be cleansed with mild soap and water. Do not cleanse the area with peroxide, alcohol, or iodine.  When you remove an adhesive bandage, be sure not to damage the skin around it.  If you have a wound, look at it several times a day to make sure it is healing.  Do not use heating pads or hot water bottles. They may burn your skin. If you have lost feeling in your feet or legs, you may not know it is happening until it is too late.  Make sure your health care provider performs a complete foot exam at least annually or more often if you have foot problems. Report any cuts, sores, or bruises to your health care provider immediately. SEEK MEDICAL CARE IF:   You have an injury that is not healing.  You have cuts or breaks in the skin.  You have an ingrown nail.  You notice redness on your legs or feet.  You feel burning or tingling in your legs or feet.  You have pain or cramps in your legs and feet.  Your legs or feet are numb.  Your feet always feel cold. SEEK IMMEDIATE MEDICAL CARE IF:   There is increasing redness,   swelling, or pain in or around a wound.  There is a red line that goes up your leg.  Pus is coming from a wound.  You develop a fever or as directed by your health care provider.  You notice a bad smell coming from an ulcer or wound. Document Released: 03/29/2000 Document Revised: 12/02/2012 Document Reviewed: 09/08/2012 Exit[employer] Patient Information 2015 [employer]. This information is not intended to replace advice given to you by your health care provider. Make sure you discuss any questions you have with your health care provider.  

## 2013-12-07 NOTE — Progress Notes (Signed)
Subjective:     Patient ID: Angelica Benson, female   DOB: 16-Apr-1928, 80 y.o.   MRN: [id number]  HPI patient presents with nail disease that is painful 1-5 both feet that she cannot cut   Review of Systems     Objective:   Physical Exam Neurovascular status intact with thick yellow brittle nailbeds 1-5 both feet that are painful    Assessment:     Mycotic nail infection is with pain 1-5 both feet    Plan:     Debride painful nailbeds 1-5 both feet with no iatrogenic bleeding noted

## 2013-12-30 ENCOUNTER — Other Ambulatory Visit: Payer: Self-pay | Admitting: Endocrinology

## 2014-02-23 ENCOUNTER — Other Ambulatory Visit: Payer: Self-pay | Admitting: Endocrinology

## 2014-03-07 ENCOUNTER — Ambulatory Visit (INDEPENDENT_AMBULATORY_CARE_PROVIDER_SITE_OTHER): Payer: Medicare Other | Admitting: Podiatry

## 2014-03-07 DIAGNOSIS — M79673 Pain in unspecified foot: Secondary | ICD-10-CM

## 2014-03-07 DIAGNOSIS — B351 Tinea unguium: Secondary | ICD-10-CM

## 2014-03-07 NOTE — Progress Notes (Signed)
Subjective:     Patient ID: Angelica Benson, female   DOB: [birth year], 80 y.o.   MRN: [id number]  HPI patient presents with nail disease that is painful 1-5 both feet that she cannot cut   Review of Systems     Objective:   Physical Exam Neurovascular status intact with thick yellow brittle nailbeds 1-5 both feet that are painful    Assessment:     Mycotic nail infection is with pain 1-5 both feet    Plan:     Debride painful nailbeds 1-5 both feet with no iatrogenic bleeding noted

## 2014-03-16 ENCOUNTER — Other Ambulatory Visit (INDEPENDENT_AMBULATORY_CARE_PROVIDER_SITE_OTHER): Payer: PRIVATE HEALTH INSURANCE

## 2014-03-16 DIAGNOSIS — E119 Type 2 diabetes mellitus without complications: Secondary | ICD-10-CM

## 2014-03-16 LAB — URINALYSIS, ROUTINE W REFLEX MICROSCOPIC
BILIRUBIN URINE: NEGATIVE
Hgb urine dipstick: NEGATIVE
Ketones, ur: NEGATIVE
Leukocytes, UA: NEGATIVE
NITRITE: NEGATIVE
PH: 5.5 (ref 5.0–8.0)
RBC / HPF: NONE SEEN (ref 0–?)
SPECIFIC GRAVITY, URINE: 1.015 (ref 1.000–1.030)
TOTAL PROTEIN, URINE-UPE24: NEGATIVE
Urine Glucose: NEGATIVE
Urobilinogen, UA: 0.2 (ref 0.0–1.0)
WBC, UA: NONE SEEN (ref 0–?)

## 2014-03-16 LAB — MICROALBUMIN / CREATININE URINE RATIO
Creatinine,U: 78 mg/dL
MICROALB/CREAT RATIO: 1 mg/g (ref 0.0–30.0)
Microalb, Ur: 0.8 mg/dL (ref 0.0–1.9)

## 2014-03-16 LAB — HEMOGLOBIN A1C: HEMOGLOBIN A1C: 7.1 % — AB (ref 4.6–6.5)

## 2014-03-17 LAB — COMPREHENSIVE METABOLIC PANEL
ALK PHOS: 57 U/L (ref 39–117)
ALT: 10 U/L (ref 0–35)
AST: 18 U/L (ref 0–37)
Albumin: 3.5 g/dL (ref 3.5–5.2)
BUN: 19 mg/dL (ref 6–23)
CO2: 27 meq/L (ref 19–32)
Calcium: 8.7 mg/dL (ref 8.4–10.5)
Chloride: 102 mEq/L (ref 96–112)
Creatinine, Ser: 1 mg/dL (ref 0.4–1.2)
GFR: 70.44 mL/min (ref >60.00)
Glucose, Bld: 100 mg/dL — ABNORMAL HIGH (ref 70–99)
Potassium: 3.2 mEq/L — ABNORMAL LOW (ref 3.5–5.1)
SODIUM: 138 meq/L (ref 135–145)
TOTAL PROTEIN: 6.5 g/dL (ref 6.0–8.3)
Total Bilirubin: 0.5 mg/dL (ref 0.2–1.2)

## 2014-03-21 ENCOUNTER — Encounter: Payer: Self-pay | Admitting: Endocrinology

## 2014-03-21 ENCOUNTER — Ambulatory Visit (INDEPENDENT_AMBULATORY_CARE_PROVIDER_SITE_OTHER): Payer: PRIVATE HEALTH INSURANCE | Admitting: Endocrinology

## 2014-03-21 VITALS — BP 134/62 | HR 83 | Temp 98.0°F | Resp 16 | Ht 62.0 in | Wt 233.0 lb

## 2014-03-21 DIAGNOSIS — E876 Hypokalemia: Secondary | ICD-10-CM

## 2014-03-21 DIAGNOSIS — E119 Type 2 diabetes mellitus without complications: Secondary | ICD-10-CM

## 2014-03-21 NOTE — Progress Notes (Signed)
Patient ID: Angelica Benson, female   DOB: 1929-01-19, 80 y.o.   MRN: [id number]   Reason for Appointment : Followup of diabetes and other issues  History of Present Illness          Diagnosis: Type 2 diabetes mellitus, date of diagnosis: 1992       Past history: She is unclear about the date of her diagnosis and treatment history. At onset she had symptoms of frequent urination, weakness and sleepiness. She has been on various oral hypoglycemic drugs since her diagnosis. Has been on metformin since at least 2006 and Januvia since 2007. Also previously had taken glyburide and Avandia Her A1c has generally been in the 5.9-6.4 range previously  Recent history:  She is on Januvia and low dose metformin with fairly consistent control usually She thinks she has had some difficulty getting her medication refilled on time Her blood sugars appear to be fairly well controlled and her A1c is again near 7% which is adequate for her age She is still checking her blood sugars only in the morning as she forgets later in the day Overall she thinks she is watching her diet well but has not lost any more weight Not able to exercise but is trying to walk when she can around her home  Oral hypoglycemic drugs the patient is taking are: Metformin 500 mg twice a day, Januvia      Side effects from medications have been: None  Glucose monitoring:  done  one time a day         Glucometer: One Touch.      Blood Glucose readings from 104-147 in am with overall median 120  Hypoglycemia:   none  Glycemic control:   Lab Results  Component Value Date   HGBA1C 7.1* 03/16/2014   HGBA1C 6.9* 11/15/2013   HGBA1C 6.9* 06/14/2013   Lab Results  Component Value Date   MICROALBUR 0.8 03/16/2014   LDLCALC 69 11/15/2013   CREATININE 1.0 03/16/2014    Self-care: The diet that the patient has been following is: tries to limit high-fat foods    Meals: 3 meals per day.  at breakfast has instant oatmeal and toast. At  lunch will have sandwich and fruit; dinner is chicken, starch and vegetables; her snacks are fruit, juice, peanut butter or cheese         Exercise:    will walk around her living area, up to 5 days a week Dietician visit: Most recent: 2000            Compliance with the medical regimen: Good Retinal exam: Most recent: 12/14.   Urine microalbumin was normal in 05/2012  Weight history:  Wt Readings from Last 3 Encounters:  03/21/14 233 lb (105.688 kg)  11/18/13 232 lb 3.2 oz (105.325 kg)  08/18/13 231 lb (104.781 kg)      Medication List       This list is accurate as of: 03/21/14  9:11 AM.  Always use your most recent med list.               acetaminophen 500 MG tablet  Commonly known as:  TYLENOL  Take 500 mg by mouth every 6 (six) hours as needed for pain.     albuterol 108 (90 BASE) MCG/ACT inhaler  Commonly known as:  PROVENTIL HFA;VENTOLIN HFA  Inhale 2 puffs into the lungs every 6 (six) hours as needed for wheezing or shortness of breath.     aspirin 81  MG chewable tablet  Chew 81 mg by mouth daily.     COMBIVENT RESPIMAT 20-100 MCG/ACT Aers respimat  Generic drug:  Ipratropium-Albuterol  Inhale 1 puff into the lungs 2 (two) times daily.     diclofenac sodium 1 % Gel  Commonly known as:  VOLTAREN  Apply 4 g topically 4 (four) times daily.     ergocalciferol 50000 UNITS capsule  Commonly known as:  VITAMIN D2  Take 50,000 Units by mouth 2 (two) times a week.     Vitamin D (Ergocalciferol) 50000 UNITS Caps capsule  Commonly known as:  DRISDOL  TAKE 1 CAPSULE BY MOUTH TWICE A WEEK     fluticasone 50 MCG/ACT nasal spray  Commonly known as:  FLONASE  Place 2 sprays into the nose daily.     freestyle lancets  Use as instructed to check blood sugars once a day dx code 250.00     freestyle lancets     ONETOUCH DELICA LANCETS 09U Misc  USE AS DIRECTED TO CHECK BLOOD SUGAR EVERY DAY     gabapentin 100 MG capsule  Commonly known as:  NEURONTIN  Take 100 mg  by mouth 2 (two) times daily.     hydrochlorothiazide 25 MG tablet  Commonly known as:  HYDRODIURIL  Take 25 mg by mouth daily.     hydrOXYzine 25 MG tablet  Commonly known as:  ATARAX/VISTARIL  Take 25 mg by mouth 3 (three) times daily as needed for itching.     metFORMIN 500 MG tablet  Commonly known as:  GLUCOPHAGE  Take 500 mg by mouth 2 (two) times daily with a meal.     ONE TOUCH ULTRA 2 W/DEVICE Kit  Use to check blood sugar daily dx code 250.00     ONE TOUCH ULTRA TEST test strip  Generic drug:  glucose blood  CHECK BLOOD SUGAR DAILY AS DIRECTED.     potassium chloride SA 20 MEQ tablet  Commonly known as:  K-DUR,KLOR-CON  Take 20 mEq by mouth daily.     sitaGLIPtin 100 MG tablet  Commonly known as:  JANUVIA  Take 100 mg by mouth daily.     VITAMIN B-12 IJ  Inject 1 mL as directed every 30 (thirty) days.        Allergies:  Allergies  Allergen Reactions  . Morphine And Related Rash    Past Medical History  Diagnosis Date  . Arthritis   . Asthma   . CHF (congestive heart failure)   . Hypertension   . Diabetes mellitus without complication   . Chronic headaches   . Constipation   . Hemorrhoids without complication     Past Surgical History  Procedure Laterality Date  . Cholecystectomy    . Back surgery    . Knee surgery    . Abdominal hysterectomy      Family History  Problem Relation Age of Onset  . Diabetes Son   . Hypertension Son     Social History:  reports that she has never smoked. She has never used smokeless tobacco. She reports that she does not drink alcohol or use illicit drugs.    Review of Systems        Lipids:  has never been on treatment with a statin drug, levels are normal without statin drugs  Lab Results  Component Value Date   CHOL 148 11/15/2013   HDL 52.10 11/15/2013   LDLCALC 69 11/15/2013   TRIG 133.0 11/15/2013   CHOLHDL 3 11/15/2013  The blood pressure has been controlled with HCTZ alone but she  does appear to have hypokalemia now     She has had chronic anemia.  Has not been on any supplements for this,  Recently on vitamin B 12 from her PCP  Lab Results  Component Value Date   WBC 9.4 11/30/2013   HGB 10.8* 11/30/2013   HCT 33.1* 11/30/2013   MCV 88.0 11/30/2013   PLT 189 11/30/2013    No history of coronary artery disease or CVA  No numbness in feet. No previous history of neuropathy  Diabetic foot exam in 8/15 showed  Inconsistent decrease sensation in toes left but not plantar surface, not clear if she has a reliable understanding, no skin lesions or ulcers on the feet and normal pedal pulses Diabetic shoes not indicated  She has a history of a small goiter in the past, euthyroid  Lab Results  Component Value Date   TSH 1.62 11/15/2013     She recently had swelling of her ankles  Physical Examination:  BP 134/62 mmHg  Pulse 83  Temp(Src) 98 F (36.7 C)  Resp 16  Ht 5' 2"  (1.575 m)  Wt 233 lb (105.688 kg)  BMI 42.61 kg/m2  SpO2 95%  No edema    ASSESSMENT/ PLAN:   Diabetes type 2 with obesity, relatively mild and well controlled with 2 drugs  Although she may have some postprandial hyperglycemia and A1c is over 7% this is adequate for her age She is also trying to be as active as possible  She was advised to watch calorie intake Currently taking low dose metformin and Januvia and will continue same regimen  History of hypertension, Controlled with HCTZ; however blood pressure is fairly good even with her not getting the medication for the last week and her potassium is relatively low.  She can try taking half a tablet daily  Hypokalemia: To follow-up with PCP.   Angelica Benson 03/21/2014, 9:11 AM

## 2014-03-21 NOTE — Patient Instructions (Addendum)
Stick a note on Fridge about checking sugar some days at 8 pm  Take 1/2 only of hydrochlorothiazide, ask about rechecking potassium

## 2014-05-30 ENCOUNTER — Ambulatory Visit: Payer: Medicaid Other | Admitting: Podiatry

## 2014-06-06 ENCOUNTER — Ambulatory Visit: Payer: Medicare Other

## 2014-06-13 ENCOUNTER — Ambulatory Visit (INDEPENDENT_AMBULATORY_CARE_PROVIDER_SITE_OTHER): Payer: Medicare Other | Admitting: Podiatry

## 2014-06-13 ENCOUNTER — Encounter: Payer: Self-pay | Admitting: Podiatry

## 2014-06-13 DIAGNOSIS — M79676 Pain in unspecified toe(s): Secondary | ICD-10-CM | POA: Diagnosis not present

## 2014-06-13 DIAGNOSIS — B351 Tinea unguium: Secondary | ICD-10-CM

## 2014-06-13 NOTE — Patient Instructions (Signed)
Diabetes and Foot Care Diabetes may cause you to have problems because of poor blood supply (circulation) to your feet and legs. This may cause the skin on your feet to become thinner, break easier, and heal more slowly. Your skin may become dry, and the skin may peel and crack. You may also have nerve damage in your legs and feet causing decreased feeling in them. You may not notice minor injuries to your feet that could lead to infections or more serious problems. Taking care of your feet is one of the most important things you can do for yourself.  HOME CARE INSTRUCTIONS  Wear shoes at all times, even in the house. Do not go barefoot. Bare feet are easily injured.  Check your feet daily for blisters, cuts, and redness. If you cannot see the bottom of your feet, use a mirror or ask someone for help.  Wash your feet with warm water (do not use hot water) and mild soap. Then pat your feet and the areas between your toes until they are completely dry. Do not soak your feet as this can dry your skin.  Apply a moisturizing lotion or petroleum jelly (that does not contain alcohol and is unscented) to the skin on your feet and to dry, brittle toenails. Do not apply lotion between your toes.  Trim your toenails straight across. Do not dig under them or around the cuticle. File the edges of your nails with an emery board or nail file.  Do not cut corns or calluses or try to remove them with medicine.  Wear clean socks or stockings every day. Make sure they are not too tight. Do not wear knee-high stockings since they may decrease blood flow to your legs.  Wear shoes that fit properly and have enough cushioning. To break in new shoes, wear them for just a few hours a day. This prevents you from injuring your feet. Always look in your shoes before you put them on to be sure there are no objects inside.  Do not cross your legs. This may decrease the blood flow to your feet.  If you find a minor scrape,  cut, or break in the skin on your feet, keep it and the skin around it clean and dry. These areas may be cleansed with mild soap and water. Do not cleanse the area with peroxide, alcohol, or iodine.  When you remove an adhesive bandage, be sure not to damage the skin around it.  If you have a wound, look at it several times a day to make sure it is healing.  Do not use heating pads or hot water bottles. They may burn your skin. If you have lost feeling in your feet or legs, you may not know it is happening until it is too late.  Make sure your health care provider performs a complete foot exam at least annually or more often if you have foot problems. Report any cuts, sores, or bruises to your health care provider immediately. SEEK MEDICAL CARE IF:   You have an injury that is not healing.  You have cuts or breaks in the skin.  You have an ingrown nail.  You notice redness on your legs or feet.  You feel burning or tingling in your legs or feet.  You have pain or cramps in your legs and feet.  Your legs or feet are numb.  Your feet always feel cold. SEEK IMMEDIATE MEDICAL CARE IF:   There is increasing redness,   swelling, or pain in or around a wound.  There is a red line that goes up your leg.  Pus is coming from a wound.  You develop a fever or as directed by your health care provider.  You notice a bad smell coming from an ulcer or wound. Document Released: 03/29/2000 Document Revised: 12/02/2012 Document Reviewed: 09/08/2012 Exit[employer] Patient Information 2015 [employer]. This information is not intended to replace advice given to you by your health care provider. Make sure you discuss any questions you have with your health care provider.  

## 2014-06-14 NOTE — Progress Notes (Signed)
Patient ID: Angelica Benson, female   DOB: 1928/09/22, 81 y.o.   MRN: [id number]  Subjective: Patient presents complaining of painful toenails and keratoses and request debridement  Objective: The toenails are elongated, brittle, discolored and tender to palpation 6-10 Plantar keratoses right hallux  Assessment: Symptomatic onychomycoses 6-10 Keratoses 1  Plan: Debridement toenails 10 and keratoses 1 without a bleeding  Reappoint 3 months

## 2014-07-18 ENCOUNTER — Other Ambulatory Visit (INDEPENDENT_AMBULATORY_CARE_PROVIDER_SITE_OTHER): Payer: Medicare Other

## 2014-07-18 DIAGNOSIS — E119 Type 2 diabetes mellitus without complications: Secondary | ICD-10-CM

## 2014-07-18 LAB — BASIC METABOLIC PANEL
BUN: 20 mg/dL (ref 6–23)
CO2: 28 mEq/L (ref 19–32)
Calcium: 9.7 mg/dL (ref 8.4–10.5)
Chloride: 102 mEq/L (ref 96–112)
Creatinine, Ser: 0.83 mg/dL (ref 0.40–1.20)
GFR: 84.26 mL/min (ref >60.00)
Glucose, Bld: 106 mg/dL — ABNORMAL HIGH (ref 70–99)
Potassium: 3.8 mEq/L (ref 3.5–5.1)
Sodium: 137 mEq/L (ref 135–145)

## 2014-07-18 LAB — URINALYSIS, ROUTINE W REFLEX MICROSCOPIC
Bilirubin Urine: NEGATIVE
Hgb urine dipstick: NEGATIVE
Ketones, ur: NEGATIVE
LEUKOCYTES UA: NEGATIVE
Nitrite: NEGATIVE
RBC / HPF: NONE SEEN (ref 0–?)
SPECIFIC GRAVITY, URINE: 1.02 (ref 1.000–1.030)
Total Protein, Urine: NEGATIVE
URINE GLUCOSE: NEGATIVE
Urobilinogen, UA: 0.2 (ref 0.0–1.0)
WBC UA: NONE SEEN (ref 0–?)
pH: 6 (ref 5.0–8.0)

## 2014-07-18 LAB — HEMOGLOBIN A1C: Hgb A1c MFr Bld: 6.8 % — ABNORMAL HIGH (ref 4.6–6.5)

## 2014-07-18 LAB — MICROALBUMIN / CREATININE URINE RATIO
Creatinine,U: 124.9 mg/dL
MICROALB/CREAT RATIO: 0.6 mg/g (ref 0.0–30.0)
Microalb, Ur: <0.7 mg/dL (ref 0.0–1.9)

## 2014-07-21 ENCOUNTER — Encounter: Payer: Self-pay | Admitting: Endocrinology

## 2014-07-21 ENCOUNTER — Ambulatory Visit (INDEPENDENT_AMBULATORY_CARE_PROVIDER_SITE_OTHER): Payer: Medicare Other | Admitting: Endocrinology

## 2014-07-21 VITALS — BP 120/60 | HR 78 | Temp 97.7°F | Wt 220.4 lb

## 2014-07-21 DIAGNOSIS — E119 Type 2 diabetes mellitus without complications: Secondary | ICD-10-CM | POA: Diagnosis not present

## 2014-07-21 DIAGNOSIS — E049 Nontoxic goiter, unspecified: Secondary | ICD-10-CM | POA: Diagnosis not present

## 2014-07-21 DIAGNOSIS — I10 Essential (primary) hypertension: Secondary | ICD-10-CM | POA: Diagnosis not present

## 2014-07-21 DIAGNOSIS — Z23 Encounter for immunization: Secondary | ICD-10-CM | POA: Diagnosis not present

## 2014-07-21 NOTE — Progress Notes (Signed)
Patient ID: Angelica Benson, female   DOB: 1928-07-27, 81 y.o.   MRN: [id number]   Reason for Appointment : Followup of diabetes and other issues  History of Present Illness          Diagnosis: Type 2 diabetes mellitus, date of diagnosis: 1992       Past history: She is unclear about the date of her diagnosis and treatment history. At onset she had symptoms of frequent urination, weakness and sleepiness. She has been on various oral hypoglycemic drugs since her diagnosis. Has been on metformin since at least 2006 and Januvia since 2007. Also previously had taken glyburide and Avandia Her A1c has generally been in the 5.9-6.4 range previously  Recent history:  She is on Januvia and low dose metformin with fairly consistent control usually She has lost a significant amount of weight since her last visit and she thinks that this is because she is not cooking on her own and she is getting healthier food to eat She has checked her sugars somewhat sporadically although has done a few more readings in the evenings after supper and these look excellent Continues to be on low dose metformin along with Januvia A1c is again near normal, slightly better at 6.8 Not able to exercise but is trying to walk when she can around her home  Oral hypoglycemic drugs the patient is taking are: Metformin 500 mg twice a day, Januvia      Side effects from medications have been: None  Glucose monitoring:  done irregularly.   Glucometer: One Touch.      Blood Glucose readings from download: Fasting 91-120 PC breakfast 198 Suppertime 84 PC supper 119-144 Overall AVERAGE 124   Hypoglycemia:   none  Glycemic control:   Lab Results  Component Value Date   HGBA1C 6.8* 07/18/2014   HGBA1C 7.1* 03/16/2014   HGBA1C 6.9* 11/15/2013   Lab Results  Component Value Date   MICROALBUR <0.7 07/18/2014   LDLCALC 69 11/15/2013   CREATININE 0.83 07/18/2014    Self-care: The diet that the patient has been following  is: tries to limit high-fat foods    Meals: 3 meals per day.  at breakfast has instant oatmeal and toast. At lunch will have sandwich and fruit; dinner is chicken, starch and vegetables; her snacks are fruit, juice, peanut butter or cheese         Exercise:    will walk around her living area, up to 5 days a week Dietician visit: Most recent: 2000            Compliance with the medical regimen: Good Retinal exam: Most recent: 12/14.   Urine microalbumin was normal in 05/2012  Weight history:  Wt Readings from Last 3 Encounters:  07/21/14 220 lb 6.4 oz (99.973 kg)  03/21/14 233 lb (105.688 kg)  11/18/13 232 lb 3.2 oz (105.325 kg)   Lab on 07/18/2014  Component Date Value Ref Range Status  . Hgb A1c MFr Bld 07/18/2014 6.8* 4.6 - 6.5 % Final   Glycemic Control Guidelines for People with Diabetes:Non Diabetic:  <6%Goal of Therapy: <7%Additional Action Suggested:  >8%   . Sodium 07/18/2014 137  135 - 145 mEq/L Final  . Potassium 07/18/2014 3.8  3.5 - 5.1 mEq/L Final  . Chloride 07/18/2014 102  96 - 112 mEq/L Final  . CO2 07/18/2014 28  19 - 32 mEq/L Final  . Glucose, Bld 07/18/2014 106* 70 - 99 mg/dL Final  . BUN 07/18/2014 20  6 - 23 mg/dL Final  . Creatinine, Ser 07/18/2014 0.83  0.40 - 1.20 mg/dL Final  . Calcium 07/18/2014 9.7  8.4 - 10.5 mg/dL Final  . GFR 07/18/2014 84.26  >60.00 mL/min Final  . Microalb, Ur 07/18/2014 <0.7  0.0 - 1.9 mg/dL Final  . Creatinine,U 07/18/2014 124.9   Final  . Microalb Creat Ratio 07/18/2014 0.6  0.0 - 30.0 mg/g Final  . Color, Urine 07/18/2014 YELLOW  Yellow;Lt. Yellow Final  . APPearance 07/18/2014 CLEAR  Clear Final  . Specific Gravity, Urine 07/18/2014 1.020  1.000-1.030 Final  . pH 07/18/2014 6.0  5.0 - 8.0 Final  . Total Protein, Urine 07/18/2014 NEGATIVE  Negative Final  . Urine Glucose 07/18/2014 NEGATIVE  Negative Final  . Ketones, ur 07/18/2014 NEGATIVE  Negative Final  . Bilirubin Urine 07/18/2014 NEGATIVE  Negative Final  . Hgb urine  dipstick 07/18/2014 NEGATIVE  Negative Final  . Urobilinogen, UA 07/18/2014 0.2  0.0 - 1.0 Final  . Leukocytes, UA 07/18/2014 NEGATIVE  Negative Final  . Nitrite 07/18/2014 NEGATIVE  Negative Final  . WBC, UA 07/18/2014 none seen  0-2/hpf Final  . RBC / HPF 07/18/2014 none seen  0-2/hpf Final  . Squamous Epithelial / LPF 07/18/2014 Rare(0-4/hpf)  Rare(0-4/hpf) Final      Medication List       This list is accurate as of: 07/21/14 11:03 AM.  Always use your most recent med list.               acetaminophen 500 MG tablet  Commonly known as:  TYLENOL  Take 500 mg by mouth every 6 (six) hours as needed for pain.     albuterol 108 (90 BASE) MCG/ACT inhaler  Commonly known as:  PROVENTIL HFA;VENTOLIN HFA  Inhale 2 puffs into the lungs every 6 (six) hours as needed for wheezing or shortness of breath.     aspirin 81 MG chewable tablet  Chew 81 mg by mouth daily.     COMBIVENT RESPIMAT 20-100 MCG/ACT Aers respimat  Generic drug:  Ipratropium-Albuterol  Inhale 1 puff into the lungs 2 (two) times daily.     DULERA 200-5 MCG/ACT Aero  Generic drug:  mometasone-formoterol     ergocalciferol 50000 UNITS capsule  Commonly known as:  VITAMIN D2  Take 50,000 Units by mouth 2 (two) times a week.     Vitamin D (Ergocalciferol) 50000 UNITS Caps capsule  Commonly known as:  DRISDOL  TAKE 1 CAPSULE BY MOUTH TWICE A WEEK     fluticasone 50 MCG/ACT nasal spray  Commonly known as:  FLONASE  Place 2 sprays into the nose daily.     freestyle lancets  Use as instructed to check blood sugars once a day dx code 250.00     freestyle lancets     ONETOUCH DELICA LANCETS 53M Misc  USE AS DIRECTED TO CHECK BLOOD SUGAR EVERY DAY     gabapentin 100 MG capsule  Commonly known as:  NEURONTIN  Take 100 mg by mouth 2 (two) times daily.     hydrochlorothiazide 25 MG tablet  Commonly known as:  HYDRODIURIL  Take 25 mg by mouth daily.     hydrOXYzine 25 MG tablet  Commonly known as:   ATARAX/VISTARIL  Take 25 mg by mouth 3 (three) times daily as needed for itching.     metFORMIN 500 MG tablet  Commonly known as:  GLUCOPHAGE  Take 500 mg by mouth 2 (two) times daily with a meal.  ONE TOUCH ULTRA 2 W/DEVICE Kit  Use to check blood sugar daily dx code 250.00     ONE TOUCH ULTRA TEST test strip  Generic drug:  glucose blood  CHECK BLOOD SUGAR DAILY AS DIRECTED.     potassium chloride SA 20 MEQ tablet  Commonly known as:  K-DUR,KLOR-CON  Take 20 mEq by mouth daily.     sitaGLIPtin 100 MG tablet  Commonly known as:  JANUVIA  Take 100 mg by mouth daily.     VITAMIN B-12 IJ  Inject 1 mL as directed every 30 (thirty) days.        Allergies:  Allergies  Allergen Reactions  . Morphine And Related Rash    Past Medical History  Diagnosis Date  . Arthritis   . Asthma   . CHF (congestive heart failure)   . Hypertension   . Diabetes mellitus without complication   . Chronic headaches   . Constipation   . Hemorrhoids without complication     Past Surgical History  Procedure Laterality Date  . Cholecystectomy    . Back surgery    . Knee surgery    . Abdominal hysterectomy      Family History  Problem Relation Age of Onset  . Diabetes Son   . Hypertension Son     Social History:  reports that she has never smoked. She has never used smokeless tobacco. She reports that she does not drink alcohol or use illicit drugs.    Review of Systems        Lipids:  has never been on treatment with a statin drug, levels are normal without statin drugs  Lab Results  Component Value Date   CHOL 148 11/15/2013   HDL 52.10 11/15/2013   LDLCALC 69 11/15/2013   TRIG 133.0 11/15/2013   CHOLHDL 3 11/15/2013       The blood pressure has been controlled with HCTZ alone      She has had chronic anemia.  Has not been on any supplements for this, not on vitamin B 12 from her PCP, not clear if this has been followed  Lab Results  Component Value Date   WBC  9.4 11/30/2013   HGB 10.8* 11/30/2013   HCT 33.1* 11/30/2013   MCV 88.0 11/30/2013   PLT 189 11/30/2013    No history of coronary artery disease or CVA  No numbness in feet.  On gabapentin, ? For nerve pain and she takes this twice a day  Diabetic foot exam in 8/15 showed  Inconsistent decrease sensation in toes left but not plantar surface, not clear if she has a reliable understanding, no skin lesions or ulcers on the feet and normal pedal pulses Diabetic shoes not indicated  She has a history of a small goiter in the past, euthyroid  Lab Results  Component Value Date   TSH 1.62 11/15/2013   Her leg swelling appears to be better  Physical Examination:  BP 120/60 mmHg  Pulse 78  Temp(Src) 97.7 F (36.5 C) (Oral)  Wt 220 lb 6.4 oz (99.973 kg)  No edema  Firm twice normal nodular thyroid palpable especially in the midline    ASSESSMENT/ PLAN:   Diabetes type 2 with obesity, relatively mild and well controlled with 2 drugs  A1c is excellent at 6.8 She is doing better with her diet with healthier meals and has lost some weight also She does check her sugars sometimes after meals and most of her readings are excellent Currently  taking low dose metformin and Januvia and will continue same regimen  History of hypertension, Controlled with HCTZ Potassium is normal now  ?  Neuropathy: She is not having any significant symptoms and she can try to take the gabapentin as needed  Patient Instructions  Please check blood sugars at least half the time about 2 hours after any meal and 3 times per week on waking up. Please bring blood sugar monitor to each visit. Recommended blood sugar levels about 2 hours after meal is 140-180 and on waking up 90-130  Gabapentin as needed     Berkleigh Beckles 07/21/2014, 11:03 AM

## 2014-07-21 NOTE — Patient Instructions (Addendum)
Please check blood sugars at least half the time about 2 hours after any meal and 3 times per week on waking up. Please bring blood sugar monitor to each visit. Recommended blood sugar levels about 2 hours after meal is 140-180 and on waking up 90-130  Gabapentin as needed

## 2014-07-21 NOTE — Progress Notes (Signed)
Pre visit review using our clinic review tool, if applicable. No additional management support is needed unless otherwise documented below in the visit note. 

## 2014-08-03 ENCOUNTER — Encounter (HOSPITAL_COMMUNITY): Payer: Self-pay | Admitting: Emergency Medicine

## 2014-08-03 ENCOUNTER — Emergency Department (HOSPITAL_COMMUNITY)
Admission: EM | Admit: 2014-08-03 | Discharge: 2014-08-03 | Disposition: A | Discharge status: Home / Self Care | Payer: Medicare Other | Attending: Emergency Medicine | Admitting: Emergency Medicine

## 2014-08-03 ENCOUNTER — Emergency Department (HOSPITAL_COMMUNITY): Payer: Medicare Other

## 2014-08-03 DIAGNOSIS — I10 Essential (primary) hypertension: Secondary | ICD-10-CM | POA: Diagnosis not present

## 2014-08-03 DIAGNOSIS — Z79899 Other long term (current) drug therapy: Secondary | ICD-10-CM | POA: Insufficient documentation

## 2014-08-03 DIAGNOSIS — I509 Heart failure, unspecified: Secondary | ICD-10-CM | POA: Insufficient documentation

## 2014-08-03 DIAGNOSIS — Z7951 Long term (current) use of inhaled steroids: Secondary | ICD-10-CM | POA: Insufficient documentation

## 2014-08-03 DIAGNOSIS — Z7982 Long term (current) use of aspirin: Secondary | ICD-10-CM | POA: Insufficient documentation

## 2014-08-03 DIAGNOSIS — Z8719 Personal history of other diseases of the digestive system: Secondary | ICD-10-CM | POA: Diagnosis not present

## 2014-08-03 DIAGNOSIS — M25552 Pain in left hip: Secondary | ICD-10-CM | POA: Diagnosis present

## 2014-08-03 DIAGNOSIS — R1032 Left lower quadrant pain: Secondary | ICD-10-CM | POA: Insufficient documentation

## 2014-08-03 DIAGNOSIS — R109 Unspecified abdominal pain: Secondary | ICD-10-CM

## 2014-08-03 DIAGNOSIS — R160 Hepatomegaly, not elsewhere classified: Secondary | ICD-10-CM | POA: Insufficient documentation

## 2014-08-03 DIAGNOSIS — M1990 Unspecified osteoarthritis, unspecified site: Secondary | ICD-10-CM | POA: Diagnosis not present

## 2014-08-03 DIAGNOSIS — J45909 Unspecified asthma, uncomplicated: Secondary | ICD-10-CM | POA: Diagnosis not present

## 2014-08-03 LAB — URINALYSIS, ROUTINE W REFLEX MICROSCOPIC
BILIRUBIN URINE: NEGATIVE
GLUCOSE, UA: NEGATIVE mg/dL
Hgb urine dipstick: NEGATIVE
Ketones, ur: NEGATIVE mg/dL
Leukocytes, UA: NEGATIVE
NITRITE: NEGATIVE
Protein, ur: NEGATIVE mg/dL
Specific Gravity, Urine: 1.009 (ref 1.005–1.030)
Urobilinogen, UA: 0.2 mg/dL (ref 0.0–1.0)
pH: 7 (ref 5.0–8.0)

## 2014-08-03 LAB — CBC WITH DIFFERENTIAL/PLATELET
Basophils Absolute: 0 10*3/uL (ref 0.0–0.1)
Basophils Relative: 0 % (ref 0–1)
EOS ABS: 0.2 10*3/uL (ref 0.0–0.7)
EOS PCT: 3 % (ref 0–5)
HCT: 31.7 % — ABNORMAL LOW (ref 36.0–46.0)
Hemoglobin: 10.1 g/dL — ABNORMAL LOW (ref 12.0–15.0)
Lymphocytes Relative: 42 % (ref 12–46)
Lymphs Abs: 2.8 10*3/uL (ref 0.7–4.0)
MCH: 28.4 pg (ref 26.0–34.0)
MCHC: 31.9 g/dL (ref 30.0–36.0)
MCV: 89 fL (ref 78.0–100.0)
MONOS PCT: 9 % (ref 3–12)
Monocytes Absolute: 0.6 10*3/uL (ref 0.1–1.0)
Neutro Abs: 3.1 10*3/uL (ref 1.7–7.7)
Neutrophils Relative %: 46 % (ref 43–77)
PLATELETS: 225 10*3/uL (ref 150–400)
RBC: 3.56 MIL/uL — AB (ref 3.87–5.11)
RDW: 14.4 % (ref 11.5–15.5)
WBC: 6.7 10*3/uL (ref 4.0–10.5)

## 2014-08-03 LAB — BASIC METABOLIC PANEL
Anion gap: 8 (ref 5–15)
BUN: 14 mg/dL (ref 6–23)
CALCIUM: 9 mg/dL (ref 8.4–10.5)
CO2: 27 mmol/L (ref 19–32)
Chloride: 106 mmol/L (ref 96–112)
Creatinine, Ser: 0.74 mg/dL (ref 0.50–1.10)
GFR calc Af Amer: 89 mL/min — ABNORMAL LOW (ref >90)
GFR, EST NON AFRICAN AMERICAN: 77 mL/min — AB (ref >90)
Glucose, Bld: 86 mg/dL (ref 70–99)
Potassium: 3.9 mmol/L (ref 3.5–5.1)
SODIUM: 141 mmol/L (ref 135–145)

## 2014-08-03 NOTE — ED Notes (Signed)
I have attempted IV x 2 with no success.  Dr. Christy Gentles notified and he states his plan is to forego IV for now and perform CT without IV contrast.  I notify our CT tech. Of this at this time; and they state they will perform CT.

## 2014-08-03 NOTE — ED Notes (Signed)
Per ems pt from [facility]. Co left hip pain, no injury or fall reported. Per ems the hip is tender to palpation. Pt is normaly ambulatory with significant assistance. Pt is alert and oriented x4. Pain 3/10

## 2014-08-03 NOTE — ED Notes (Signed)
Patient and family verbalize understanding of discharge instructions, home care and follow up care. Patient out of department at this time with family. 

## 2014-08-03 NOTE — Discharge Instructions (Signed)
Your CT scan shows a mass in your liver. It is unclear what this is from but it is important to rule out cancer. Thus you need an MRI of your abdomen. Your primary doctor can order this. Call them tomorrow.  Abdominal Pain Many things can cause abdominal pain. Usually, abdominal pain is not caused by a disease and will improve without treatment. It can often be observed and treated at home. Your health care provider will do a physical exam and possibly order blood tests and X-rays to help determine the seriousness of your pain. However, in many cases, more time must pass before a clear cause of the pain can be found. Before that point, your health care provider may not know if you need more testing or further treatment. HOME CARE INSTRUCTIONS  Monitor your abdominal pain for any changes. The following actions may help to alleviate any discomfort you are experiencing:  Only take over-the-counter or prescription medicines as directed by your health care provider.  Do not take laxatives unless directed to do so by your health care provider.  Try a clear liquid diet (broth, tea, or water) as directed by your health care provider. Slowly move to a bland diet as tolerated. SEEK MEDICAL CARE IF:  You have unexplained abdominal pain.  You have abdominal pain associated with nausea or diarrhea.  You have pain when you urinate or have a bowel movement.  You experience abdominal pain that wakes you in the night.  You have abdominal pain that is worsened or improved by eating food.  You have abdominal pain that is worsened with eating fatty foods.  You have a fever. SEEK IMMEDIATE MEDICAL CARE IF:   Your pain does not go away within 2 hours.  You keep throwing up (vomiting).  Your pain is felt only in portions of the abdomen, such as the right side or the left lower portion of the abdomen.  You pass bloody or black tarry stools. MAKE SURE YOU:  Understand these instructions.   Will watch  your condition.   Will get help right away if you are not doing well or get worse.  Document Released: 01/09/2005 Document Revised: 04/06/2013 Document Reviewed: 12/09/2012 [facility] Patient Information 2015 [place], [place]. This information is not intended to replace advice given to you by your health care provider. Make sure you discuss any questions you have with your health care provider.

## 2014-08-03 NOTE — ED Provider Notes (Signed)
7:16 PM CT unremarkable for acute process.  CT does show concern for a liver mass. I discussed this with the patient, including possible etiologies as well as importance of getting an MRI as an outpatient. She will call her doctor tomorrow. As for her hip she is able to walk with no difficulty. Stable for discharge home.   Sherwood Gambler, MD 08/03/14 [phone]

## 2014-08-03 NOTE — ED Provider Notes (Signed)
CSN: [id number]     Arrival date & time 08/03/14  1206 History   First MD Initiated Contact with Patient 08/03/14 1418     Chief Complaint  Patient presents with  . left hip pain     Patient is a 81 y.o. female presenting with abdominal pain. The history is provided by the patient and a relative.  Abdominal Pain Pain location:  LLQ Pain quality: aching   Pain severity:  Moderate Onset quality:  Gradual Duration:  1 day Timing:  Intermittent Progression:  Worsening Chronicity:  Recurrent Relieved by:  Nothing Worsened by:  Palpation Associated symptoms: nausea   Associated symptoms: no chest pain, no constipation, no diarrhea, no dysuria, no fever and no shortness of breath     Past Medical History  Diagnosis Date  . Arthritis   . Asthma   . CHF (congestive heart failure)   . Hypertension   . Diabetes mellitus without complication   . Chronic headaches   . Constipation   . Hemorrhoids without complication    Past Surgical History  Procedure Laterality Date  . Cholecystectomy    . Back surgery    . Knee surgery    . Abdominal hysterectomy     Family History  Problem Relation Age of Onset  . Diabetes Son   . Hypertension Son    History  Substance Use Topics  . Smoking status: Never Smoker   . Smokeless tobacco: Never Used  . Alcohol Use: No   OB History    No data available     Review of Systems  Constitutional: Negative for fever.  Respiratory: Negative for shortness of breath.   Cardiovascular: Negative for chest pain.  Gastrointestinal: Positive for nausea and abdominal pain. Negative for diarrhea and constipation.  Genitourinary: Negative for dysuria.  Musculoskeletal: Negative for back pain.  All other systems reviewed and are negative.     Allergies  Morphine and related  Home Medications   Prior to Admission medications   Medication Sig Start Date End Date Taking? Authorizing Provider  acetaminophen (TYLENOL) 500 MG tablet Take 500 mg by  mouth every 6 (six) hours as needed for pain.   Yes Historical Provider, MD  albuterol (PROVENTIL HFA;VENTOLIN HFA) 108 (90 BASE) MCG/ACT inhaler Inhale 2 puffs into the lungs every 6 (six) hours as needed for wheezing or shortness of breath.   Yes Historical Provider, MD  aspirin 81 MG chewable tablet Chew 81 mg by mouth daily.   Yes Historical Provider, MD  Blood Glucose Monitoring Suppl (ONE TOUCH ULTRA 2) W/DEVICE KIT Use to check blood sugar daily dx code 250.00 07/07/13  Yes Elayne Snare, MD  DULERA 200-5 MCG/ACT AERO Inhale 2 puffs into the lungs daily as needed for wheezing or shortness of breath.  07/15/14  Yes Historical Provider, MD  fluticasone (FLONASE) 50 MCG/ACT nasal spray Place 2 sprays into the nose daily. 02/07/13  Yes Blanchie Dessert, MD  gabapentin (NEURONTIN) 100 MG capsule Take 100 mg by mouth 2 (two) times daily.  10/28/13  Yes Historical Provider, MD  hydrochlorothiazide (HYDRODIURIL) 25 MG tablet Take 12.5 mg by mouth daily.    Yes Historical Provider, MD  metFORMIN (GLUCOPHAGE) 500 MG tablet Take 500 mg by mouth 2 (two) times daily with a meal.   Yes Historical Provider, MD  ONE TOUCH ULTRA TEST test strip CHECK BLOOD SUGAR DAILY AS DIRECTED.   Yes Elayne Snare, MD  [facility] DELICA LANCETS 75T MISC USE AS DIRECTED TO CHECK BLOOD SUGAR  EVERY DAY 12/30/13  Yes Elayne Snare, MD  potassium chloride SA (K-DUR,KLOR-CON) 20 MEQ tablet Take 20 mEq by mouth daily.   Yes Historical Provider, MD  sitaGLIPtin (JANUVIA) 100 MG tablet Take 100 mg by mouth daily.   Yes Historical Provider, MD  Vitamin D, Ergocalciferol, (DRISDOL) 50000 UNITS CAPS capsule TAKE 1 CAPSULE BY MOUTH TWICE A WEEK Patient taking differently: TAKE 1 CAPSULE BY MOUTH ONCE WEEKLY 02/24/14  Yes Elayne Snare, MD  Lancets (FREESTYLE) lancets Use as instructed to check blood sugars once a day dx code 250.00 06/23/13   Elayne Snare, MD   BP 161/58 mmHg  Pulse 55  Temp(Src) 97.4 F (36.3 C) (Axillary)  Resp 16  SpO2 99% Physical  Exam CONSTITUTIONAL: Well developed/well nourished HEAD: Normocephalic/atraumatic EYES: EOMI/PERRL ENMT: Mucous membranes moist NECK: supple no meningeal signs SPINE/BACK:entire spine nontender CV: S1/S2 noted, no murmurs/rubs/gallops noted LUNGS: Lungs are clear to auscultation bilaterally, no apparent distress ABDOMEN: soft, moderate tenderness in LLQ, no rebound or guarding, bowel sounds noted throughout abdomen, she is obese.  No hernia noted GU:no cva tenderness NEURO: Pt is awake/alert/appropriate, moves all extremitiesx4.  No facial droop.   EXTREMITIES: pulses normal/equal, full ROM, no deformity, no focal tenderness noted SKIN: warm, color normal PSYCH: no abnormalities of mood noted, alert and oriented to situation  ED Course  Procedures   3:45 PM Initial report as left hip pain She has no hip pain with ROM She has abd tenderness Given tenderness and body habitus which limits evaluation, will obtain CT imaging Pt agreeable She does not want pain meds 4:41 PM At signout to dr Regenia Skeeter f/u on CT imaging, if negative and she can ambulate she can be discharged home  She does not want pain meds  Labs Review Labs Reviewed  BASIC METABOLIC PANEL - Abnormal; Notable for the following:    GFR calc non Af Amer 77 (*)    GFR calc Af Amer 89 (*)    All other components within normal limits  CBC WITH DIFFERENTIAL/PLATELET - Abnormal; Notable for the following:    RBC 3.56 (*)    Hemoglobin 10.1 (*)    HCT 31.7 (*)    All other components within normal limits  URINALYSIS, ROUTINE W REFLEX MICROSCOPIC      MDM   Final diagnoses:  None    Nursing notes including past medical history and social history reviewed and considered in documentation Labs/vital reviewed myself and considered during evaluation     Ripley Fraise, MD 08/03/14 1641

## 2014-08-03 NOTE — ED Notes (Signed)
Patient transported to CT 

## 2014-08-03 NOTE — ED Notes (Signed)
It was initially reported by EMS to be left hip pain; however, upon exam, her pain is more llq abd. Area.  She denies fever/n/v/d and is in no distress.  She tells me it's been hurting intermittently for about two weeks.

## 2014-08-03 NOTE — ED Notes (Signed)
Bed: [facility] Expected date:  Expected time:  Means of arrival:  Comments: Hold hip pain

## 2014-08-06 ENCOUNTER — Other Ambulatory Visit: Payer: Self-pay | Admitting: Endocrinology

## 2014-09-05 ENCOUNTER — Ambulatory Visit: Payer: Medicare Other

## 2014-09-14 DIAGNOSIS — E119 Type 2 diabetes mellitus without complications: Secondary | ICD-10-CM | POA: Diagnosis not present

## 2014-09-15 DIAGNOSIS — E119 Type 2 diabetes mellitus without complications: Secondary | ICD-10-CM | POA: Diagnosis not present

## 2014-09-16 DIAGNOSIS — E119 Type 2 diabetes mellitus without complications: Secondary | ICD-10-CM | POA: Diagnosis not present

## 2014-09-17 DIAGNOSIS — E119 Type 2 diabetes mellitus without complications: Secondary | ICD-10-CM | POA: Diagnosis not present

## 2014-09-18 DIAGNOSIS — E119 Type 2 diabetes mellitus without complications: Secondary | ICD-10-CM | POA: Diagnosis not present

## 2014-09-19 DIAGNOSIS — E119 Type 2 diabetes mellitus without complications: Secondary | ICD-10-CM | POA: Diagnosis not present

## 2014-09-20 DIAGNOSIS — E119 Type 2 diabetes mellitus without complications: Secondary | ICD-10-CM | POA: Diagnosis not present

## 2014-09-21 DIAGNOSIS — E119 Type 2 diabetes mellitus without complications: Secondary | ICD-10-CM | POA: Diagnosis not present

## 2014-09-22 DIAGNOSIS — E119 Type 2 diabetes mellitus without complications: Secondary | ICD-10-CM | POA: Diagnosis not present

## 2014-09-23 DIAGNOSIS — E119 Type 2 diabetes mellitus without complications: Secondary | ICD-10-CM | POA: Diagnosis not present

## 2014-09-24 DIAGNOSIS — E119 Type 2 diabetes mellitus without complications: Secondary | ICD-10-CM | POA: Diagnosis not present

## 2014-09-25 DIAGNOSIS — E119 Type 2 diabetes mellitus without complications: Secondary | ICD-10-CM | POA: Diagnosis not present

## 2014-09-26 ENCOUNTER — Ambulatory Visit (INDEPENDENT_AMBULATORY_CARE_PROVIDER_SITE_OTHER): Payer: Medicare Other | Admitting: Podiatry

## 2014-09-26 DIAGNOSIS — M79676 Pain in unspecified toe(s): Secondary | ICD-10-CM

## 2014-09-26 DIAGNOSIS — R160 Hepatomegaly, not elsewhere classified: Secondary | ICD-10-CM | POA: Diagnosis not present

## 2014-09-26 DIAGNOSIS — B351 Tinea unguium: Secondary | ICD-10-CM | POA: Diagnosis not present

## 2014-09-26 DIAGNOSIS — E119 Type 2 diabetes mellitus without complications: Secondary | ICD-10-CM | POA: Diagnosis not present

## 2014-09-26 NOTE — Progress Notes (Signed)
Patient ID: Angelica Benson, female   DOB: October 12, 1928, 81 y.o.   MRN: [id number]  Subjective: Patient presents complaining of painful toenails and keratoses and request debridement  Objective: The toenails are elongated, brittle, discolored and tender to palpation 6-10 Plantar keratoses right hallux  Assessment: Symptomatic onychomycoses 6-10 Keratoses 1  Plan: Debridement toenails 10 and keratoses 1 without a bleeding  Reappoint 3 months

## 2014-09-27 DIAGNOSIS — E119 Type 2 diabetes mellitus without complications: Secondary | ICD-10-CM | POA: Diagnosis not present

## 2014-09-28 DIAGNOSIS — E119 Type 2 diabetes mellitus without complications: Secondary | ICD-10-CM | POA: Diagnosis not present

## 2014-09-29 DIAGNOSIS — E119 Type 2 diabetes mellitus without complications: Secondary | ICD-10-CM | POA: Diagnosis not present

## 2014-09-30 DIAGNOSIS — E119 Type 2 diabetes mellitus without complications: Secondary | ICD-10-CM | POA: Diagnosis not present

## 2014-10-01 DIAGNOSIS — E119 Type 2 diabetes mellitus without complications: Secondary | ICD-10-CM | POA: Diagnosis not present

## 2014-10-13 DIAGNOSIS — R1030 Lower abdominal pain, unspecified: Secondary | ICD-10-CM | POA: Diagnosis not present

## 2014-10-14 DIAGNOSIS — M0540 Rheumatoid myopathy with rheumatoid arthritis of unspecified site: Secondary | ICD-10-CM | POA: Diagnosis not present

## 2014-10-14 DIAGNOSIS — R32 Unspecified urinary incontinence: Secondary | ICD-10-CM | POA: Diagnosis not present

## 2014-10-14 DIAGNOSIS — I509 Heart failure, unspecified: Secondary | ICD-10-CM | POA: Diagnosis not present

## 2014-10-14 DIAGNOSIS — E119 Type 2 diabetes mellitus without complications: Secondary | ICD-10-CM | POA: Diagnosis not present

## 2014-10-14 DIAGNOSIS — J4522 Mild intermittent asthma with status asthmaticus: Secondary | ICD-10-CM | POA: Diagnosis not present

## 2014-10-17 DIAGNOSIS — R32 Unspecified urinary incontinence: Secondary | ICD-10-CM | POA: Diagnosis not present

## 2014-10-17 DIAGNOSIS — J4522 Mild intermittent asthma with status asthmaticus: Secondary | ICD-10-CM | POA: Diagnosis not present

## 2014-10-17 DIAGNOSIS — E119 Type 2 diabetes mellitus without complications: Secondary | ICD-10-CM | POA: Diagnosis not present

## 2014-10-17 DIAGNOSIS — I509 Heart failure, unspecified: Secondary | ICD-10-CM | POA: Diagnosis not present

## 2014-10-19 DIAGNOSIS — I10 Essential (primary) hypertension: Secondary | ICD-10-CM | POA: Diagnosis not present

## 2014-10-19 DIAGNOSIS — C228 Malignant neoplasm of liver, primary, unspecified as to type: Secondary | ICD-10-CM | POA: Diagnosis not present

## 2014-10-19 DIAGNOSIS — I509 Heart failure, unspecified: Secondary | ICD-10-CM | POA: Diagnosis not present

## 2014-10-19 DIAGNOSIS — E1165 Type 2 diabetes mellitus with hyperglycemia: Secondary | ICD-10-CM | POA: Diagnosis not present

## 2014-10-19 DIAGNOSIS — G4700 Insomnia, unspecified: Secondary | ICD-10-CM | POA: Diagnosis not present

## 2014-11-03 DIAGNOSIS — E119 Type 2 diabetes mellitus without complications: Secondary | ICD-10-CM | POA: Diagnosis not present

## 2014-11-03 DIAGNOSIS — I509 Heart failure, unspecified: Secondary | ICD-10-CM | POA: Diagnosis not present

## 2014-11-03 DIAGNOSIS — R32 Unspecified urinary incontinence: Secondary | ICD-10-CM | POA: Diagnosis not present

## 2014-11-03 DIAGNOSIS — M0540 Rheumatoid myopathy with rheumatoid arthritis of unspecified site: Secondary | ICD-10-CM | POA: Diagnosis not present

## 2014-11-03 DIAGNOSIS — J4522 Mild intermittent asthma with status asthmaticus: Secondary | ICD-10-CM | POA: Diagnosis not present

## 2014-11-16 ENCOUNTER — Other Ambulatory Visit (INDEPENDENT_AMBULATORY_CARE_PROVIDER_SITE_OTHER): Payer: Medicare Other

## 2014-11-16 DIAGNOSIS — E119 Type 2 diabetes mellitus without complications: Secondary | ICD-10-CM | POA: Diagnosis not present

## 2014-11-16 LAB — LIPID PANEL
Cholesterol: 148 mg/dL (ref 0–200)
HDL: 59.2 mg/dL (ref >39.00)
LDL CALC: 70 mg/dL (ref 0–99)
NonHDL: 89.2
Total CHOL/HDL Ratio: 3
Triglycerides: 98 mg/dL (ref 0.0–149.0)
VLDL: 19.6 mg/dL (ref 0.0–40.0)

## 2014-11-16 LAB — COMPREHENSIVE METABOLIC PANEL
ALBUMIN: 3.5 g/dL (ref 3.5–5.2)
ALT: 6 U/L (ref 0–35)
AST: 13 U/L (ref 0–37)
Alkaline Phosphatase: 50 U/L (ref 39–117)
BILIRUBIN TOTAL: 0.4 mg/dL (ref 0.2–1.2)
BUN: 17 mg/dL (ref 6–23)
CALCIUM: 9.4 mg/dL (ref 8.4–10.5)
CO2: 31 mEq/L (ref 19–32)
Chloride: 102 mEq/L (ref 96–112)
Creatinine, Ser: 0.88 mg/dL (ref 0.40–1.20)
GFR: 78.69 mL/min (ref >60.00)
Glucose, Bld: 105 mg/dL — ABNORMAL HIGH (ref 70–99)
Potassium: 4.1 mEq/L (ref 3.5–5.1)
Sodium: 140 mEq/L (ref 135–145)
TOTAL PROTEIN: 6.5 g/dL (ref 6.0–8.3)

## 2014-11-16 LAB — HEMOGLOBIN A1C: Hgb A1c MFr Bld: 6.5 % (ref 4.6–6.5)

## 2014-11-21 ENCOUNTER — Encounter: Payer: Self-pay | Admitting: Endocrinology

## 2014-11-21 ENCOUNTER — Ambulatory Visit (INDEPENDENT_AMBULATORY_CARE_PROVIDER_SITE_OTHER): Payer: Medicare Other | Admitting: Endocrinology

## 2014-11-21 VITALS — BP 128/62 | HR 71 | Temp 98.2°F | Resp 16 | Ht 62.0 in | Wt 233.0 lb

## 2014-11-21 DIAGNOSIS — E119 Type 2 diabetes mellitus without complications: Secondary | ICD-10-CM | POA: Diagnosis not present

## 2014-11-21 NOTE — Progress Notes (Signed)
Patient ID: Angelica Benson, female   DOB: August 13, 1928, 81 y.o.   MRN: [id number]   Reason for Appointment : Followup of diabetes and other issues  History of Present Illness          Diagnosis: Type 2 diabetes mellitus, date of diagnosis: 1992       Past history: She is unclear about the date of her diagnosis and treatment history. At onset she had symptoms of frequent urination, weakness and sleepiness. She has been on various oral hypoglycemic drugs since her diagnosis. Has been on metformin since at least 2006 and Januvia since 2007. Also previously had taken glyburide and Avandia Her A1c has generally been in the 5.9-6.4 range previously  Recent history:  She is on Januvia and low dose metformin with fairly consistent control  A1c has gradually improved However she is gaining weight and she thinks this is since she cannot control her appetite Not able to exercise but is trying to walk when she can around her home  Oral hypoglycemic drugs the patient is taking are: Metformin 500 mg twice a day, Januvia      Side effects from medications have been: None  Glucose monitoring:  done irregularly and only in the morning usually.   Glucometer: One Touch.      Blood Glucose readings from download: Fasting 100-117.  9 PM 114   Hypoglycemia:   none  Glycemic control:   Lab Results  Component Value Date   HGBA1C 6.5 11/16/2014   HGBA1C 6.8* 07/18/2014   HGBA1C 7.1* 03/16/2014   Lab Results  Component Value Date   MICROALBUR <0.7 07/18/2014   LDLCALC 70 11/16/2014   CREATININE 0.88 11/16/2014    Self-care: The diet that the patient has been following is: tries to limit high-fat foods    Meals: 3 meals per day.  at breakfast has instant oatmeal and toast. At lunch will have sandwich and fruit; dinner is chicken, starch and vegetables; her snacks are fruit, juice, peanut butter or cheese         Exercise:    will walk around her living area, up to 5 days a week Dietician visit: Most  recent: 2000            Compliance with the medical regimen: Good Retinal exam: Most recent: 12/14.   Urine microalbumin was normal in 05/2012  Weight history:  Wt Readings from Last 3 Encounters:  11/21/14 233 lb (105.688 kg)  07/21/14 220 lb 6.4 oz (99.973 kg)  03/21/14 233 lb (105.688 kg)   Appointment on 11/16/2014  Component Date Value Ref Range Status  . Hgb A1c MFr Bld 11/16/2014 6.5  4.6 - 6.5 % Final   Glycemic Control Guidelines for People with Diabetes:Non Diabetic:  <6%Goal of Therapy: <7%Additional Action Suggested:  >8%   . Sodium 11/16/2014 140  135 - 145 mEq/L Final  . Potassium 11/16/2014 4.1  3.5 - 5.1 mEq/L Final  . Chloride 11/16/2014 102  96 - 112 mEq/L Final  . CO2 11/16/2014 31  19 - 32 mEq/L Final  . Glucose, Bld 11/16/2014 105* 70 - 99 mg/dL Final  . BUN 11/16/2014 17  6 - 23 mg/dL Final  . Creatinine, Ser 11/16/2014 0.88  0.40 - 1.20 mg/dL Final  . Total Bilirubin 11/16/2014 0.4  0.2 - 1.2 mg/dL Final  . Alkaline Phosphatase 11/16/2014 50  39 - 117 U/L Final  . AST 11/16/2014 13  0 - 37 U/L Final  . ALT 11/16/2014 6  0 - 35 U/L Final  . Total Protein 11/16/2014 6.5  6.0 - 8.3 g/dL Final  . Albumin 11/16/2014 3.5  3.5 - 5.2 g/dL Final  . Calcium 11/16/2014 9.4  8.4 - 10.5 mg/dL Final  . GFR 11/16/2014 78.69  >60.00 mL/min Final  . Cholesterol 11/16/2014 148  0 - 200 mg/dL Final   ATP III Classification       Desirable:  < 200 mg/dL               Borderline High:  200 - 239 mg/dL          High:  > = 240 mg/dL  . Triglycerides 11/16/2014 98.0  0.0 - 149.0 mg/dL Final   Normal:  <150 mg/dLBorderline High:  150 - 199 mg/dL  . HDL 11/16/2014 59.20  >39.00 mg/dL Final  . VLDL 11/16/2014 19.6  0.0 - 40.0 mg/dL Final  . LDL Cholesterol 11/16/2014 70  0 - 99 mg/dL Final  . Total CHOL/HDL Ratio 11/16/2014 3   Final                  Men          Women1/2 Average Risk     3.4          3.3Average Risk          5.0          4.42X Average Risk          9.6           7.13X Average Risk          15.0          11.0                      . NonHDL 11/16/2014 89.20   Final   NOTE:  Non-HDL goal should be 30 mg/dL higher than patient's LDL goal (i.e. LDL goal of < 70 mg/dL, would have non-HDL goal of < 100 mg/dL)      Medication List       This list is accurate as of: 11/21/14  3:40 PM.  Always use your most recent med list.               acetaminophen 500 MG tablet  Commonly known as:  TYLENOL  Take 500 mg by mouth every 6 (six) hours as needed for pain.     albuterol 108 (90 BASE) MCG/ACT inhaler  Commonly known as:  PROVENTIL HFA;VENTOLIN HFA  Inhale 2 puffs into the lungs every 6 (six) hours as needed for wheezing or shortness of breath.     aspirin 81 MG chewable tablet  Chew 81 mg by mouth daily.     DULERA 200-5 MCG/ACT Aero  Generic drug:  mometasone-formoterol  Inhale 2 puffs into the lungs daily as needed for wheezing or shortness of breath.     fluticasone 50 MCG/ACT nasal spray  Commonly known as:  FLONASE  Place 2 sprays into the nose daily.     freestyle lancets  Use as instructed to check blood sugars once a day dx code 458.59     ONETOUCH DELICA LANCETS 29W Misc  USE AS DIRECTED TO CHECK BLOOD SUGAR EVERY DAY     gabapentin 100 MG capsule  Commonly known as:  NEURONTIN  Take 100 mg by mouth 2 (two) times daily.     hydrochlorothiazide 25 MG tablet  Commonly known as:  HYDRODIURIL  Take 12.5 mg  by mouth daily.     metFORMIN 500 MG tablet  Commonly known as:  GLUCOPHAGE  Take 500 mg by mouth 2 (two) times daily with a meal.     ONE TOUCH ULTRA 2 W/DEVICE Kit  Use to check blood sugar daily dx code 250.00     ONE TOUCH ULTRA TEST test strip  Generic drug:  glucose blood  USE TO CHECK BLOOD SUGAR DAILY AS DIRECTED     potassium chloride SA 20 MEQ tablet  Commonly known as:  K-DUR,KLOR-CON  Take 20 mEq by mouth daily.     sitaGLIPtin 100 MG tablet  Commonly known as:  JANUVIA  Take 100 mg by mouth daily.      traZODone 50 MG tablet  Commonly known as:  DESYREL     Vitamin D (Ergocalciferol) 50000 UNITS Caps capsule  Commonly known as:  DRISDOL  TAKE 1 CAPSULE BY MOUTH TWICE A WEEK     VOLTAREN 1 % Gel  Generic drug:  diclofenac sodium        Allergies:  Allergies  Allergen Reactions  . Morphine And Related Rash    Past Medical History  Diagnosis Date  . Arthritis   . Asthma   . CHF (congestive heart failure)   . Hypertension   . Diabetes mellitus without complication   . Chronic headaches   . Constipation   . Hemorrhoids without complication     Past Surgical History  Procedure Laterality Date  . Cholecystectomy    . Back surgery    . Knee surgery    . Abdominal hysterectomy      Family History  Problem Relation Age of Onset  . Diabetes Son   . Hypertension Son     Social History:  reports that she has never smoked. She has never used smokeless tobacco. She reports that she does not drink alcohol or use illicit drugs.    Review of Systems        Lipids: Has not been on treatment with a statin drug, levels are normal without statin drugs  Lab Results  Component Value Date   CHOL 148 11/16/2014   HDL 59.20 11/16/2014   LDLCALC 70 11/16/2014   TRIG 98.0 11/16/2014   CHOLHDL 3 11/16/2014       The blood pressure has been controlled with HCTZ, followed by PCP       She has had chronic anemia.  Has not been on any supplements for this, will be going to her PCP again soon  Lab Results  Component Value Date   WBC 6.7 08/03/2014   HGB 10.1* 08/03/2014   HCT 31.7* 08/03/2014   MCV 89.0 08/03/2014   PLT 225 08/03/2014    No history of coronary artery disease or CVA  No numbness in feet.  On gabapentin, ? For nerve pain and she takes this twice a day  Diabetic foot exam in 8/16 showed spotty decrease in monofilament sensation on some of her toes, inconsistent   She has a history of a small goiter in the past, euthyroid  Lab Results  Component Value  Date   TSH 1.62 11/15/2013   Her leg swelling appears to be better  Physical Examination:  BP 128/62 mmHg  Pulse 71  Temp(Src) 98.2 F (36.8 C)  Resp 16  Ht _0  (1.575 m)  Wt 233 lb (105.688 kg)  BMI 42.61 kg/m2  SpO2 95%  No edema  Diabetic foot exam shows mostly normal monofilament sensation in  the toes and plantar surfaces, no skin lesions or ulcers on the feet and normal pedal pulses     ASSESSMENT/ PLAN:   Diabetes type 2 with obesity, relatively mild and well controlled with 2 drugs including low dose metformin  A1c is excellent at 6.5 now She is not doing well with her diet and has gained back a lot of weight Also not able to exercise much She does not check her sugars sometimes after meals and most of her fasting readings are excellent Currently taking low dose metformin and Januvia and will continue same regimen  History of hypertension: Controlled with HCTZ  She will follow-up with her PCP for other issues   Patient Instructions  Check blood sugars on waking up .Marland Kitchen 2-3 .Marland Kitchen times a week Also check blood sugars about 2 hours after a meal and do this after different meals by rotation  Recommended blood sugar levels on waking up is 90-130 and about 2 hours after meal is 140-180 Please bring blood sugar monitor to each visit.      Abdulhamid Olgin 11/21/2014, 3:40 PM

## 2014-11-21 NOTE — Patient Instructions (Signed)
Check blood sugars on waking up .. 2-3 .. times a week  Also check blood sugars about 2 hours after a meal and do this after different meals by rotation  Recommended blood sugar levels on waking up is 90-130 and about 2 hours after meal is 140-180 Please bring blood sugar monitor to each visit.  

## 2014-12-13 DIAGNOSIS — R160 Hepatomegaly, not elsewhere classified: Secondary | ICD-10-CM | POA: Diagnosis not present

## 2014-12-15 ENCOUNTER — Other Ambulatory Visit: Payer: Self-pay

## 2014-12-15 ENCOUNTER — Other Ambulatory Visit: Payer: Self-pay | Admitting: Gastroenterology

## 2014-12-15 DIAGNOSIS — R160 Hepatomegaly, not elsewhere classified: Secondary | ICD-10-CM

## 2014-12-15 DIAGNOSIS — Z1231 Encounter for screening mammogram for malignant neoplasm of breast: Secondary | ICD-10-CM

## 2014-12-16 DIAGNOSIS — J4522 Mild intermittent asthma with status asthmaticus: Secondary | ICD-10-CM | POA: Diagnosis not present

## 2014-12-16 DIAGNOSIS — M0540 Rheumatoid myopathy with rheumatoid arthritis of unspecified site: Secondary | ICD-10-CM | POA: Diagnosis not present

## 2014-12-16 DIAGNOSIS — E119 Type 2 diabetes mellitus without complications: Secondary | ICD-10-CM | POA: Diagnosis not present

## 2014-12-16 DIAGNOSIS — R32 Unspecified urinary incontinence: Secondary | ICD-10-CM | POA: Diagnosis not present

## 2014-12-16 DIAGNOSIS — I509 Heart failure, unspecified: Secondary | ICD-10-CM | POA: Diagnosis not present

## 2014-12-21 DIAGNOSIS — E1165 Type 2 diabetes mellitus with hyperglycemia: Secondary | ICD-10-CM | POA: Diagnosis not present

## 2014-12-21 DIAGNOSIS — I509 Heart failure, unspecified: Secondary | ICD-10-CM | POA: Diagnosis not present

## 2014-12-21 DIAGNOSIS — I10 Essential (primary) hypertension: Secondary | ICD-10-CM | POA: Diagnosis not present

## 2014-12-21 DIAGNOSIS — C228 Malignant neoplasm of liver, primary, unspecified as to type: Secondary | ICD-10-CM | POA: Diagnosis not present

## 2014-12-21 DIAGNOSIS — E0842 Diabetes mellitus due to underlying condition with diabetic polyneuropathy: Secondary | ICD-10-CM | POA: Diagnosis not present

## 2015-01-05 ENCOUNTER — Ambulatory Visit (INDEPENDENT_AMBULATORY_CARE_PROVIDER_SITE_OTHER): Payer: Medicare Other | Admitting: Podiatry

## 2015-01-05 ENCOUNTER — Encounter: Payer: Self-pay | Admitting: Podiatry

## 2015-01-05 DIAGNOSIS — M79676 Pain in unspecified toe(s): Secondary | ICD-10-CM

## 2015-01-05 DIAGNOSIS — B351 Tinea unguium: Secondary | ICD-10-CM | POA: Diagnosis not present

## 2015-01-05 DIAGNOSIS — E1149 Type 2 diabetes mellitus with other diabetic neurological complication: Secondary | ICD-10-CM

## 2015-01-05 NOTE — Progress Notes (Signed)
Patient ID: Angelica Benson, female   DOB: [birth year], 81 y.o.   MRN: [id number] Complaint:  Visit Type: Patient returns to my office for continued preventative foot care services. Complaint: Patient states" my nails have grown long and thick and become painful to walk and wear shoes" Patient has been diagnosed with DM with neuropathy.. The patient presents for preventative foot care services. No changes to ROS  Podiatric Exam: Vascular: dorsalis pedis and posterior tibial pulses are palpable bilateral. Capillary return is immediate. Temperature gradient is WNL. Skin turgor WNL  Sensorium: Normal Semmes Weinstein monofilament test. Normal tactile sensation bilaterally. Nail Exam: Pt has thick disfigured discolored nails with subungual debris noted bilateral entire nail hallux through fifth toenails Ulcer Exam: There is no evidence of ulcer or pre-ulcerative changes or infection. Orthopedic Exam: Muscle tone and strength are WNL. No limitations in general ROM. No crepitus or effusions noted. Foot type and digits show no abnormalities. Bony prominences are unremarkable. Skin: No Porokeratosis. No infection or ulcers  Diagnosis:  Onychomycosis, , Pain in right toe, pain in left toes  Treatment & Plan Procedures and Treatment: Consent by patient was obtained for treatment procedures. The patient understood the discussion of treatment and procedures well. All questions were answered thoroughly reviewed. Debridement of mycotic and hypertrophic toenails, 1 through 5 bilateral and clearing of subungual debris. No ulceration, no infection noted.  Initiate diabetic shoe paperwork. Return Visit-Office Procedure: Patient instructed to return to the office for a follow up visit 3 months for continued evaluation and treatment.

## 2015-01-09 ENCOUNTER — Other Ambulatory Visit: Payer: Medicare Other

## 2015-01-09 ENCOUNTER — Other Ambulatory Visit: Payer: Self-pay | Admitting: Gastroenterology

## 2015-01-09 ENCOUNTER — Ambulatory Visit
Admission: RE | Admit: 2015-01-09 | Discharge: 2015-01-09 | Disposition: A | Discharge status: Home / Self Care | Payer: Medicare Other | Source: Ambulatory Visit | Attending: Gastroenterology | Admitting: Gastroenterology

## 2015-01-09 DIAGNOSIS — K7689 Other specified diseases of liver: Secondary | ICD-10-CM | POA: Diagnosis not present

## 2015-01-09 DIAGNOSIS — R160 Hepatomegaly, not elsewhere classified: Secondary | ICD-10-CM

## 2015-01-20 ENCOUNTER — Ambulatory Visit
Admission: RE | Admit: 2015-01-20 | Discharge: 2015-01-20 | Disposition: A | Discharge status: Home / Self Care | Payer: Medicare Other | Source: Ambulatory Visit

## 2015-01-20 DIAGNOSIS — Z1231 Encounter for screening mammogram for malignant neoplasm of breast: Secondary | ICD-10-CM | POA: Diagnosis not present

## 2015-01-25 ENCOUNTER — Ambulatory Visit: Payer: Medicare Other

## 2015-01-27 DIAGNOSIS — R160 Hepatomegaly, not elsewhere classified: Secondary | ICD-10-CM | POA: Diagnosis not present

## 2015-02-02 DIAGNOSIS — J4522 Mild intermittent asthma with status asthmaticus: Secondary | ICD-10-CM | POA: Diagnosis not present

## 2015-02-02 DIAGNOSIS — M0540 Rheumatoid myopathy with rheumatoid arthritis of unspecified site: Secondary | ICD-10-CM | POA: Diagnosis not present

## 2015-02-02 DIAGNOSIS — I509 Heart failure, unspecified: Secondary | ICD-10-CM | POA: Diagnosis not present

## 2015-02-02 DIAGNOSIS — E119 Type 2 diabetes mellitus without complications: Secondary | ICD-10-CM | POA: Diagnosis not present

## 2015-02-02 DIAGNOSIS — R32 Unspecified urinary incontinence: Secondary | ICD-10-CM | POA: Diagnosis not present

## 2015-02-13 DIAGNOSIS — E119 Type 2 diabetes mellitus without complications: Secondary | ICD-10-CM | POA: Diagnosis not present

## 2015-02-13 DIAGNOSIS — J4522 Mild intermittent asthma with status asthmaticus: Secondary | ICD-10-CM | POA: Diagnosis not present

## 2015-02-13 DIAGNOSIS — I509 Heart failure, unspecified: Secondary | ICD-10-CM | POA: Diagnosis not present

## 2015-02-13 DIAGNOSIS — M0540 Rheumatoid myopathy with rheumatoid arthritis of unspecified site: Secondary | ICD-10-CM | POA: Diagnosis not present

## 2015-02-13 DIAGNOSIS — R32 Unspecified urinary incontinence: Secondary | ICD-10-CM | POA: Diagnosis not present

## 2015-02-14 DIAGNOSIS — J4522 Mild intermittent asthma with status asthmaticus: Secondary | ICD-10-CM | POA: Diagnosis not present

## 2015-02-14 DIAGNOSIS — R32 Unspecified urinary incontinence: Secondary | ICD-10-CM | POA: Diagnosis not present

## 2015-02-14 DIAGNOSIS — E119 Type 2 diabetes mellitus without complications: Secondary | ICD-10-CM | POA: Diagnosis not present

## 2015-02-14 DIAGNOSIS — I509 Heart failure, unspecified: Secondary | ICD-10-CM | POA: Diagnosis not present

## 2015-02-16 ENCOUNTER — Emergency Department (HOSPITAL_COMMUNITY)
Admission: EM | Admit: 2015-02-16 | Discharge: 2015-02-16 | Disposition: A | Discharge status: Home / Self Care | Payer: Medicare Other | Attending: Emergency Medicine | Admitting: Emergency Medicine

## 2015-02-16 ENCOUNTER — Encounter (HOSPITAL_COMMUNITY): Payer: Self-pay | Admitting: *Deleted

## 2015-02-16 DIAGNOSIS — G8929 Other chronic pain: Secondary | ICD-10-CM | POA: Insufficient documentation

## 2015-02-16 DIAGNOSIS — Z79899 Other long term (current) drug therapy: Secondary | ICD-10-CM | POA: Diagnosis not present

## 2015-02-16 DIAGNOSIS — J45909 Unspecified asthma, uncomplicated: Secondary | ICD-10-CM | POA: Insufficient documentation

## 2015-02-16 DIAGNOSIS — I509 Heart failure, unspecified: Secondary | ICD-10-CM | POA: Diagnosis not present

## 2015-02-16 DIAGNOSIS — M1990 Unspecified osteoarthritis, unspecified site: Secondary | ICD-10-CM | POA: Diagnosis not present

## 2015-02-16 DIAGNOSIS — E119 Type 2 diabetes mellitus without complications: Secondary | ICD-10-CM | POA: Diagnosis not present

## 2015-02-16 DIAGNOSIS — Z7951 Long term (current) use of inhaled steroids: Secondary | ICD-10-CM | POA: Insufficient documentation

## 2015-02-16 DIAGNOSIS — Z7982 Long term (current) use of aspirin: Secondary | ICD-10-CM | POA: Diagnosis not present

## 2015-02-16 DIAGNOSIS — L309 Dermatitis, unspecified: Secondary | ICD-10-CM | POA: Diagnosis not present

## 2015-02-16 DIAGNOSIS — I10 Essential (primary) hypertension: Secondary | ICD-10-CM | POA: Insufficient documentation

## 2015-02-16 DIAGNOSIS — R21 Rash and other nonspecific skin eruption: Secondary | ICD-10-CM | POA: Diagnosis present

## 2015-02-16 NOTE — Discharge Instructions (Signed)
Continue using your hydrocortisone cream as prescribed. I also recommend using an emollient such as Eucerin daily to keep her skin hydrated. Follow-up with your primary care provider in one week. Return to the emergency department if symptoms worsen or new onset of fever, difficulty breathing, numbness, tingling, weakness.

## 2015-02-16 NOTE — ED Provider Notes (Signed)
CSN: [id number]     Arrival date & time 02/16/15  1226 History  By signing my name below, I, Emmanuella Mensah, attest that this documentation has been prepared under the direction and in the presence of Harlene Ramus, PA-C. Electronically Signed: Judithann Sauger, ED Scribe. 02/16/2015. 3:28 PM.      Chief Complaint  Patient presents with  . Rash   The history is provided by the patient. No language interpreter was used.   HPI Comments: Angelica Benson is a 81 y.o. female with a hx of arthritis, asthma, CHF, HTN, and DM who presents to the Emergency Department complaining of generalized itchy rash onset 2 weeks ago. She reports associated small areas of redness throughout the body consistent with scratch marks and a subjective fever. She denies any new lotions, soaps, meds or detergent. She adds that someone new has been washing her clothes for the past 2 weeks and is not sure if they switched her detergent. Pt reports that she was seen by her PCP and was given a prescription for an ointment to use 4 times a day for the rash and reports relief with the ointment. She states that her aide in the morning normally helps her put the cream on her back but she is unable to reach her back when her aide leaves resulting in her rash reoccurring.   PCP: Dr. Jeanie Cooks   Past Medical History  Diagnosis Date  . Arthritis   . Asthma   . CHF (congestive heart failure) ([place])   . Hypertension   . Diabetes mellitus without complication ([place])   . Chronic headaches   . Constipation   . Hemorrhoids without complication    Past Surgical History  Procedure Laterality Date  . Cholecystectomy    . Back surgery    . Knee surgery    . Abdominal hysterectomy     Family History  Problem Relation Age of Onset  . Diabetes Son   . Hypertension Son    Social History  Substance Use Topics  . Smoking status: Never Smoker   . Smokeless tobacco: Never Used  . Alcohol Use: No   OB History    No data available      Review of Systems  Constitutional: Negative for fever.  Skin: Positive for rash.      Allergies  Morphine and related  Home Medications   Prior to Admission medications   Medication Sig Start Date End Date Taking? Authorizing Provider  acetaminophen (TYLENOL) 500 MG tablet Take 500 mg by mouth every 6 (six) hours as needed for pain.    Historical Provider, MD  albuterol (PROVENTIL HFA;VENTOLIN HFA) 108 (90 BASE) MCG/ACT inhaler Inhale 2 puffs into the lungs every 6 (six) hours as needed for wheezing or shortness of breath.    Historical Provider, MD  aspirin 81 MG chewable tablet Chew 81 mg by mouth daily.    Historical Provider, MD  Blood Glucose Monitoring Suppl (ONE TOUCH ULTRA 2) W/DEVICE KIT Use to check blood sugar daily dx code 250.00 07/07/13   Elayne Snare, MD  DULERA 200-5 MCG/ACT AERO Inhale 2 puffs into the lungs daily as needed for wheezing or shortness of breath.  07/15/14   Historical Provider, MD  fluticasone (FLONASE) 50 MCG/ACT nasal spray Place 2 sprays into the nose daily. 02/07/13   Blanchie Dessert, MD  gabapentin (NEURONTIN) 100 MG capsule Take 100 mg by mouth 2 (two) times daily.  10/28/13   Historical Provider, MD  hydrochlorothiazide (HYDRODIURIL) 25  MG tablet Take 12.5 mg by mouth daily.     Historical Provider, MD  Lancets (FREESTYLE) lancets Use as instructed to check blood sugars once a day dx code 250.00 06/23/13   Elayne Snare, MD  metFORMIN (GLUCOPHAGE) 500 MG tablet Take 500 mg by mouth 2 (two) times daily with a meal.    Historical Provider, MD  ONE TOUCH ULTRA TEST test strip USE TO CHECK BLOOD SUGAR DAILY AS DIRECTED 08/08/14   Elayne Snare, MD  [facility] DELICA LANCETS 15B MISC USE AS DIRECTED TO CHECK BLOOD SUGAR EVERY DAY 12/30/13   Elayne Snare, MD  potassium chloride SA (K-DUR,KLOR-CON) 20 MEQ tablet Take 20 mEq by mouth daily.    Historical Provider, MD  sitaGLIPtin (JANUVIA) 100 MG tablet Take 100 mg by mouth daily.    Historical Provider, MD  traZODone  (DESYREL) 50 MG tablet  11/16/14   Historical Provider, MD  Vitamin D, Ergocalciferol, (DRISDOL) 50000 UNITS CAPS capsule TAKE 1 CAPSULE BY MOUTH TWICE A WEEK Patient taking differently: TAKE 1 CAPSULE BY MOUTH ONCE WEEKLY 02/24/14   Elayne Snare, MD  VOLTAREN 1 % GEL  10/31/14   Historical Provider, MD   BP 152/60 mmHg  Pulse 65  Temp(Src) 97.6 F (36.4 C) (Oral)  Resp 17  SpO2 100% Physical Exam  Constitutional: She is oriented to person, place, and time. She appears well-developed and well-nourished. No distress.  HENT:  Head: Normocephalic and atraumatic.  Nose: Nose normal.  Mouth/Throat: Uvula is midline, oropharynx is clear and moist and mucous membranes are normal. No oropharyngeal exudate.  Eyes: Conjunctivae and EOM are normal.  Neck: Neck supple. No tracheal deviation present.  Cardiovascular: Normal rate.   Pulmonary/Chest: Effort normal. No respiratory distress.  Musculoskeletal: Normal range of motion.  Neurological: She is alert and oriented to person, place, and time.  Skin: Skin is warm and dry.  Multiple small areas of erythema, scaling and xerosis noted to BLE and back, no vesicles/papules/pustules, no drainage, no bleeding. Excoriations present.  Psychiatric: She has a normal mood and affect. Her behavior is normal.  Nursing note and vitals reviewed.   ED Course  Procedures (including critical care time) DIAGNOSTIC STUDIES: Oxygen Saturation is 100% on RA, normal by my interpretation.    COORDINATION OF CARE: 3:24 PM- Pt advised of plan for treatment and pt agrees. Pt advised to use Eucerin for dry skin and to continue using the cream she received from her PCP. Pt recommended to reach out to family member or aide for help reaching her back.    Labs Review Labs Reviewed - No data to display  Imaging Review No results found.   Filed Vitals:   02/16/15 1243  BP: 152/60  Pulse: 65  Temp: 97.6 F (36.4 C)  Resp: 17     MDM   Final diagnoses:  Eczema     Patient presents to the ED with rash. She notes she was seen by her PCP 2 weeks ago and was given an ointment which she reports having relief of symptoms. Patient has been unable to apply the pain into her back and reports the rash has since returned. Denies fever. VSS. Exam reveals multiple small areas of erythema, scaling and xerosis noted to the legs and back. Rash is consistent with eczema. Plan to discharge patient home. Advised patient to continue using hydrocortisone cream and also advised patient to use and minimally in. Advised patient to follow up with PCP.  Evaluation does not show pathology requring ongoing  emergent intervention or admission. Pt is hemodynamically stable and mentating appropriately. Discussed findings/results and plan with patient/guardian, who agrees with plan. All questions answered. Return precautions discussed and outpatient follow up given.    I personally performed the services described in this documentation, which was scribed in my presence. The recorded information has been reviewed and is accurate.   Chesley Noon [place], [place] 02/16/15 [place], MD 02/16/15 1537

## 2015-02-16 NOTE — ED Notes (Signed)
Patient is being evaluated by PCP for rash to entire body, given rx for cream but patient states she is unable to reach her back.

## 2015-03-01 DIAGNOSIS — I10 Essential (primary) hypertension: Secondary | ICD-10-CM | POA: Diagnosis not present

## 2015-03-01 DIAGNOSIS — I509 Heart failure, unspecified: Secondary | ICD-10-CM | POA: Diagnosis not present

## 2015-03-01 DIAGNOSIS — C228 Malignant neoplasm of liver, primary, unspecified as to type: Secondary | ICD-10-CM | POA: Diagnosis not present

## 2015-03-01 DIAGNOSIS — B86 Scabies: Secondary | ICD-10-CM | POA: Diagnosis not present

## 2015-03-01 DIAGNOSIS — E1165 Type 2 diabetes mellitus with hyperglycemia: Secondary | ICD-10-CM | POA: Diagnosis not present

## 2015-03-06 DIAGNOSIS — J4522 Mild intermittent asthma with status asthmaticus: Secondary | ICD-10-CM | POA: Diagnosis not present

## 2015-03-06 DIAGNOSIS — E119 Type 2 diabetes mellitus without complications: Secondary | ICD-10-CM | POA: Diagnosis not present

## 2015-03-06 DIAGNOSIS — I509 Heart failure, unspecified: Secondary | ICD-10-CM | POA: Diagnosis not present

## 2015-03-20 ENCOUNTER — Other Ambulatory Visit (INDEPENDENT_AMBULATORY_CARE_PROVIDER_SITE_OTHER): Payer: Medicare Other

## 2015-03-20 DIAGNOSIS — E119 Type 2 diabetes mellitus without complications: Secondary | ICD-10-CM | POA: Diagnosis not present

## 2015-03-20 LAB — BASIC METABOLIC PANEL
BUN: 18 mg/dL (ref 6–23)
CHLORIDE: 101 meq/L (ref 96–112)
CO2: 29 mEq/L (ref 19–32)
Calcium: 9.7 mg/dL (ref 8.4–10.5)
Creatinine, Ser: 0.86 mg/dL (ref 0.40–1.20)
GFR: 80.74 mL/min (ref >60.00)
Glucose, Bld: 105 mg/dL — ABNORMAL HIGH (ref 70–99)
POTASSIUM: 3.9 meq/L (ref 3.5–5.1)
SODIUM: 137 meq/L (ref 135–145)

## 2015-03-20 LAB — HEMOGLOBIN A1C: Hgb A1c MFr Bld: 6.4 % (ref 4.6–6.5)

## 2015-03-21 ENCOUNTER — Telehealth: Payer: Self-pay | Admitting: Endocrinology

## 2015-03-21 NOTE — Telephone Encounter (Addendum)
Angelica Benson from [facility] need order to come to patient house 2 x a month to help her take her medications, (Patient is getting confused when to take her meds) Fax # [phone]

## 2015-03-21 NOTE — Telephone Encounter (Signed)
I faxed [facility] back, Dr. Dwyane Dee see's her for Diabetes only, they must contact patients PCP (Dr. Nolene Ebbs) for all forms that need to be filled out. This has been repeated multiple times to them.

## 2015-03-22 ENCOUNTER — Observation Stay (HOSPITAL_COMMUNITY): Payer: Medicare Other

## 2015-03-22 ENCOUNTER — Observation Stay (HOSPITAL_COMMUNITY)
Admission: EM | Admit: 2015-03-22 | Discharge: 2015-03-25 | Disposition: A | Discharge status: Skilled Nursing Facility | Payer: Medicare Other | Attending: Internal Medicine | Admitting: Internal Medicine

## 2015-03-22 ENCOUNTER — Emergency Department (HOSPITAL_COMMUNITY): Payer: Medicare Other

## 2015-03-22 ENCOUNTER — Encounter (HOSPITAL_COMMUNITY): Payer: Self-pay | Admitting: General Practice

## 2015-03-22 ENCOUNTER — Ambulatory Visit: Payer: Medicare Other | Admitting: Endocrinology

## 2015-03-22 DIAGNOSIS — D649 Anemia, unspecified: Secondary | ICD-10-CM | POA: Diagnosis not present

## 2015-03-22 DIAGNOSIS — I10 Essential (primary) hypertension: Secondary | ICD-10-CM | POA: Insufficient documentation

## 2015-03-22 DIAGNOSIS — J45909 Unspecified asthma, uncomplicated: Secondary | ICD-10-CM | POA: Diagnosis not present

## 2015-03-22 DIAGNOSIS — Z7982 Long term (current) use of aspirin: Secondary | ICD-10-CM | POA: Diagnosis not present

## 2015-03-22 DIAGNOSIS — R531 Weakness: Secondary | ICD-10-CM

## 2015-03-22 DIAGNOSIS — I509 Heart failure, unspecified: Secondary | ICD-10-CM | POA: Insufficient documentation

## 2015-03-22 DIAGNOSIS — G451 Carotid artery syndrome (hemispheric): Secondary | ICD-10-CM | POA: Diagnosis not present

## 2015-03-22 DIAGNOSIS — M5010 Cervical disc disorder with radiculopathy, unspecified cervical region: Secondary | ICD-10-CM | POA: Insufficient documentation

## 2015-03-22 DIAGNOSIS — E119 Type 2 diabetes mellitus without complications: Secondary | ICD-10-CM | POA: Insufficient documentation

## 2015-03-22 DIAGNOSIS — G939 Disorder of brain, unspecified: Secondary | ICD-10-CM | POA: Insufficient documentation

## 2015-03-22 DIAGNOSIS — Z66 Do not resuscitate: Secondary | ICD-10-CM | POA: Diagnosis not present

## 2015-03-22 DIAGNOSIS — G459 Transient cerebral ischemic attack, unspecified: Secondary | ICD-10-CM | POA: Diagnosis not present

## 2015-03-22 DIAGNOSIS — Z79899 Other long term (current) drug therapy: Secondary | ICD-10-CM | POA: Diagnosis not present

## 2015-03-22 DIAGNOSIS — E785 Hyperlipidemia, unspecified: Secondary | ICD-10-CM | POA: Insufficient documentation

## 2015-03-22 DIAGNOSIS — Z6841 Body Mass Index (BMI) 40.0 and over, adult: Secondary | ICD-10-CM | POA: Insufficient documentation

## 2015-03-22 DIAGNOSIS — Z7984 Long term (current) use of oral hypoglycemic drugs: Secondary | ICD-10-CM | POA: Diagnosis not present

## 2015-03-22 DIAGNOSIS — G458 Other transient cerebral ischemic attacks and related syndromes: Secondary | ICD-10-CM

## 2015-03-22 DIAGNOSIS — R200 Anesthesia of skin: Secondary | ICD-10-CM | POA: Diagnosis not present

## 2015-03-22 DIAGNOSIS — I6789 Other cerebrovascular disease: Secondary | ICD-10-CM | POA: Diagnosis not present

## 2015-03-22 DIAGNOSIS — M5412 Radiculopathy, cervical region: Secondary | ICD-10-CM | POA: Insufficient documentation

## 2015-03-22 DIAGNOSIS — I6782 Cerebral ischemia: Secondary | ICD-10-CM | POA: Insufficient documentation

## 2015-03-22 LAB — CBG MONITORING, ED: Glucose-Capillary: 85 mg/dL (ref 65–99)

## 2015-03-22 LAB — PROTIME-INR
INR: 1.02 (ref 0.00–1.49)
Prothrombin Time: 13.6 seconds (ref 11.6–15.2)

## 2015-03-22 LAB — COMPREHENSIVE METABOLIC PANEL
ALK PHOS: 62 U/L (ref 38–126)
ALT: 11 U/L — AB (ref 14–54)
AST: 24 U/L (ref 15–41)
Albumin: 3.8 g/dL (ref 3.5–5.0)
Anion gap: 10 (ref 5–15)
BUN: 18 mg/dL (ref 6–20)
CALCIUM: 9.7 mg/dL (ref 8.9–10.3)
CO2: 25 mmol/L (ref 22–32)
CREATININE: 1.04 mg/dL — AB (ref 0.44–1.00)
Chloride: 104 mmol/L (ref 101–111)
GFR, EST AFRICAN AMERICAN: 56 mL/min — AB (ref >60)
GFR, EST NON AFRICAN AMERICAN: 48 mL/min — AB (ref >60)
Glucose, Bld: 112 mg/dL — ABNORMAL HIGH (ref 65–99)
Potassium: 4.4 mmol/L (ref 3.5–5.1)
Sodium: 139 mmol/L (ref 135–145)
Total Bilirubin: 0.5 mg/dL (ref 0.3–1.2)
Total Protein: 7 g/dL (ref 6.5–8.1)

## 2015-03-22 LAB — I-STAT CHEM 8, ED
BUN: 24 mg/dL — ABNORMAL HIGH (ref 6–20)
CREATININE: 0.9 mg/dL (ref 0.44–1.00)
Calcium, Ion: 1.06 mmol/L — ABNORMAL LOW (ref 1.13–1.30)
Chloride: 104 mmol/L (ref 101–111)
GLUCOSE: 105 mg/dL — AB (ref 65–99)
HCT: 39 % (ref 36.0–46.0)
HEMOGLOBIN: 13.3 g/dL (ref 12.0–15.0)
POTASSIUM: 4.2 mmol/L (ref 3.5–5.1)
Sodium: 138 mmol/L (ref 135–145)
TCO2: 26 mmol/L (ref 0–100)

## 2015-03-22 LAB — DIFFERENTIAL
BASOS ABS: 0 10*3/uL (ref 0.0–0.1)
Basophils Relative: 1 %
Eosinophils Absolute: 0.3 10*3/uL (ref 0.0–0.7)
Eosinophils Relative: 4 %
LYMPHS ABS: 2.6 10*3/uL (ref 0.7–4.0)
LYMPHS PCT: 42 %
MONO ABS: 0.4 10*3/uL (ref 0.1–1.0)
MONOS PCT: 7 %
Neutro Abs: 2.8 10*3/uL (ref 1.7–7.7)
Neutrophils Relative %: 46 %

## 2015-03-22 LAB — I-STAT TROPONIN, ED: TROPONIN I, POC: 0 ng/mL (ref 0.00–0.08)

## 2015-03-22 LAB — CBC
HEMATOCRIT: 34.6 % — AB (ref 36.0–46.0)
HEMOGLOBIN: 11.3 g/dL — AB (ref 12.0–15.0)
MCH: 29.4 pg (ref 26.0–34.0)
MCHC: 32.7 g/dL (ref 30.0–36.0)
MCV: 90.1 fL (ref 78.0–100.0)
Platelets: 217 10*3/uL (ref 150–400)
RBC: 3.84 MIL/uL — AB (ref 3.87–5.11)
RDW: 14.6 % (ref 11.5–15.5)
WBC: 6.1 10*3/uL (ref 4.0–10.5)

## 2015-03-22 LAB — APTT: aPTT: 20 seconds — ABNORMAL LOW (ref 24–37)

## 2015-03-22 MED ORDER — ASPIRIN 81 MG PO CHEW
324.0000 mg | CHEWABLE_TABLET | Freq: Once | ORAL | Status: AC
  Administered 2015-03-22: 324 mg via ORAL
  Filled 2015-03-22: qty 4

## 2015-03-22 NOTE — ED Notes (Signed)
IV attempt X2 

## 2015-03-22 NOTE — ED Notes (Signed)
CBG 85

## 2015-03-22 NOTE — ED Provider Notes (Signed)
CSN: [id number]     Arrival date & time 03/22/15  1843 History   First MD Initiated Contact with Patient 03/22/15 1955     Chief Complaint  Patient presents with  . Extremity Weakness     (Consider location/radiation/quality/duration/timing/severity/associated sxs/prior Treatment) Patient is a 81 y.o. female presenting with extremity weakness. The history is provided by the patient.  Extremity Weakness  She noted it about 5 PM that she wasn't able to move her left hand. She also noted a pain that was shooting from the left hand all the way up to the shoulder and into her head. Symptoms lasted about 5-10 minutes before completely resolving. She denies any pain now she is moving her hand normally. She did not notice any numbness or detailing. She denies any chest pain, heaviness, tightness, pressure. There is no nausea or vomiting.  Past Medical History  Diagnosis Date  . Arthritis   . Asthma   . CHF (congestive heart failure) ([place])   . Hypertension   . Diabetes mellitus without complication ([place])   . Chronic headaches   . Constipation   . Hemorrhoids without complication    Past Surgical History  Procedure Laterality Date  . Cholecystectomy    . Back surgery    . Knee surgery    . Abdominal hysterectomy     Family History  Problem Relation Age of Onset  . Diabetes Son   . Hypertension Son    Social History  Substance Use Topics  . Smoking status: Never Smoker   . Smokeless tobacco: Never Used  . Alcohol Use: No   OB History    No data available     Review of Systems  Musculoskeletal: Positive for extremity weakness.  All other systems reviewed and are negative.     Allergies  Morphine and related  Home Medications   Prior to Admission medications   Medication Sig Start Date End Date Taking? Authorizing Provider  acetaminophen (TYLENOL) 500 MG tablet Take 1,000 mg by mouth 2 (two) times daily.    Yes Historical Provider, MD  albuterol (PROVENTIL  HFA;VENTOLIN HFA) 108 (90 BASE) MCG/ACT inhaler Inhale 2 puffs into the lungs every 6 (six) hours as needed for wheezing or shortness of breath.   Yes Historical Provider, MD  aspirin 81 MG chewable tablet Chew 81 mg by mouth daily.   Yes Historical Provider, MD  DULERA 200-5 MCG/ACT AERO Inhale 2 puffs into the lungs 2 (two) times daily.  07/15/14  Yes Historical Provider, MD  gabapentin (NEURONTIN) 100 MG capsule Take 100 mg by mouth 2 (two) times daily.  10/28/13  Yes Historical Provider, MD  hydrochlorothiazide (HYDRODIURIL) 25 MG tablet Take 12.5 mg by mouth daily.    Yes Historical Provider, MD  metFORMIN (GLUCOPHAGE) 500 MG tablet Take 500 mg by mouth 2 (two) times daily with a meal.   Yes Historical Provider, MD  permethrin (ELIMITE) 5 % cream Apply 1 application topically daily as needed. 03/01/15  Yes Historical Provider, MD  potassium chloride SA (K-DUR,KLOR-CON) 20 MEQ tablet Take 20 mEq by mouth daily.   Yes Historical Provider, MD  sitaGLIPtin (JANUVIA) 100 MG tablet Take 100 mg by mouth daily.   Yes Historical Provider, MD  traZODone (DESYREL) 50 MG tablet Take 50 mg by mouth at bedtime.  11/16/14  Yes Historical Provider, MD  triamcinolone cream (KENALOG) 0.5 % Apply 1 application topically daily. 03/01/15  Yes Historical Provider, MD  Blood Glucose Monitoring Suppl (ONE TOUCH ULTRA 2) W/DEVICE  KIT Use to check blood sugar daily dx code 250.00 07/07/13   Elayne Snare, MD  fluticasone [facility]) 50 MCG/ACT nasal spray Place 2 sprays into the nose daily. 02/07/13   Blanchie Dessert, MD  Lancets (FREESTYLE) lancets Use as instructed to check blood sugars once a day dx code 250.00 06/23/13   Elayne Snare, MD  ONE TOUCH ULTRA TEST test strip USE TO CHECK BLOOD SUGAR DAILY AS DIRECTED 08/08/14   Elayne Snare, MD  [facility] DELICA LANCETS 84Y MISC USE AS DIRECTED TO CHECK BLOOD SUGAR EVERY DAY 12/30/13   Elayne Snare, MD  Vitamin D, Ergocalciferol, (DRISDOL) 50000 UNITS CAPS capsule TAKE 1 CAPSULE BY MOUTH  TWICE A WEEK Patient taking differently: TAKE 1 CAPSULE BY MOUTH ONCE WEEKLY ON SATURDAY 02/24/14   Elayne Snare, MD  VOLTAREN 1 % GEL  10/31/14   Historical Provider, MD   BP 135/56 mmHg  Pulse 35  Temp(Src) 98 F (36.7 C) (Oral)  Resp 16  Ht 5' (1.524 m)  Wt 226 lb (102.513 kg)  BMI 44.14 kg/m2  SpO2 73% Physical Exam  Nursing note and vitals reviewed.  81 year old female, resting comfortably and in no acute distress. Vital signs are normal(triage reports of pulse rate of 35 and 39 are not felt to be accurate, currently the patient is on monitor showing steady heart rate of 80 bpm).. Oxygen saturation is 100%, which is normal. Head is normocephalic and atraumatic. PERRLA, EOMI. Oropharynx is clear. Neck is nontender and supple without adenopathy or JVD. There are no carotid bruits. Back is nontender and there is no CVA tenderness. Lungs are clear without rales, wheezes, or rhonchi. Chest is nontender. Heart has regular rate and rhythm without murmur. Abdomen is soft, flat, nontender without masses or hepatosplenomegaly and peristalsis is normoactive. Extremities have no cyanosis or edema, full range of motion is present. Skin is warm and dry without rash. Neurologic: Mental status is normal, cranial nerves are intact, there are no motor or sensory deficits. Motor strength is 5/5 and both arms and both legs. There is no pronator drift.  ED Course  Procedures (including critical care time) Labs Review Results for orders placed or performed during the hospital encounter of 03/22/15  CBC  Result Value Ref Range   WBC 6.1 4.0 - 10.5 K/uL   RBC 3.84 (L) 3.87 - 5.11 MIL/uL   Hemoglobin 11.3 (L) 12.0 - 15.0 g/dL   HCT 34.6 (L) 36.0 - 46.0 %   MCV 90.1 78.0 - 100.0 fL   MCH 29.4 26.0 - 34.0 pg   MCHC 32.7 30.0 - 36.0 g/dL   RDW 14.6 11.5 - 15.5 %   Platelets 217 150 - 400 K/uL  Differential  Result Value Ref Range   Neutrophils Relative % 46 %   Neutro Abs 2.8 1.7 - 7.7 K/uL    Lymphocytes Relative 42 %   Lymphs Abs 2.6 0.7 - 4.0 K/uL   Monocytes Relative 7 %   Monocytes Absolute 0.4 0.1 - 1.0 K/uL   Eosinophils Relative 4 %   Eosinophils Absolute 0.3 0.0 - 0.7 K/uL   Basophils Relative 1 %   Basophils Absolute 0.0 0.0 - 0.1 K/uL  Comprehensive metabolic panel  Result Value Ref Range   Sodium 139 135 - 145 mmol/L   Potassium 4.4 3.5 - 5.1 mmol/L   Chloride 104 101 - 111 mmol/L   CO2 25 22 - 32 mmol/L   Glucose, Bld 112 (H) 65 - 99 mg/dL  BUN 18 6 - 20 mg/dL   Creatinine, Ser 1.04 (H) 0.44 - 1.00 mg/dL   Calcium 9.7 8.9 - 10.3 mg/dL   Total Protein 7.0 6.5 - 8.1 g/dL   Albumin 3.8 3.5 - 5.0 g/dL   AST 24 15 - 41 U/L   ALT 11 (L) 14 - 54 U/L   Alkaline Phosphatase 62 38 - 126 U/L   Total Bilirubin 0.5 0.3 - 1.2 mg/dL   GFR calc non Af Amer 48 (L) >60 mL/min   GFR calc Af Amer 56 (L) >60 mL/min   Anion gap 10 5 - 15  I-stat troponin, ED (not at [facility], [facility])  Result Value Ref Range   Troponin i, poc 0.00 0.00 - 0.08 ng/mL   Comment 3          CBG monitoring, ED  Result Value Ref Range   Glucose-Capillary 85 65 - 99 mg/dL  I-Stat Chem 8, ED  (not at [facility], [facility])  Result Value Ref Range   Sodium 138 135 - 145 mmol/L   Potassium 4.2 3.5 - 5.1 mmol/L   Chloride 104 101 - 111 mmol/L   BUN 24 (H) 6 - 20 mg/dL   Creatinine, Ser 0.90 0.44 - 1.00 mg/dL   Glucose, Bld 105 (H) 65 - 99 mg/dL   Calcium, Ion 1.06 (L) 1.13 - 1.30 mmol/L   TCO2 26 0 - 100 mmol/L   Hemoglobin 13.3 12.0 - 15.0 g/dL   HCT 39.0 36.0 - 46.0 %    Imaging Review Ct Head Wo Contrast  03/22/2015  CLINICAL DATA:  Left upper extremity numbness and weakness EXAM: CT HEAD WITHOUT CONTRAST TECHNIQUE: Contiguous axial images were obtained from the base of the skull through the vertex without intravenous contrast. COMPARISON:  February 07, 2013 FINDINGS: There is age related volume loss. There is no intracranial hemorrhage, extra-axial fluid collection, or midline shift. Patchy small vessel  disease in the centra semiovale bilaterally is stable. A well-defined mass is not appreciable. There is a focal area of decreased attenuation in the medial left temporal lobe slightly superior and lateral to the quadrigeminal plate cistern on the left, concerning for recent infarct. No other evidence suggesting recent infarct. The bony calvarium appears intact. The visualized mastoid air cells are clear. No intraorbital lesions are identified. IMPRESSION: Suspect recent infarct in the medial left temporal lobe just superior and lateral to the left quadrigeminal plate cistern. This finding is best seen on axial slice 12 series 720. Elsewhere, there is age related volume loss with patchy periventricular small vessel disease. No hemorrhage. Comment: An atypical mass with edema in the medial left temporal lobe could present in this manner. Given this finding, it would be prudent to correlate with pre and post-contrast brain MRI. Electronically Signed   By: Lowella Grip III M.D.   On: 03/22/2015 21:27   I have personally reviewed and evaluated these images and lab results as part of my medical decision-making.   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  MD, Maveryck Bahri ([id number]) on 03/22/2015 8:23:35 PM      MDM   Final diagnoses:  Transient cerebral ischemia, unspecified transient cerebral ischemia type  Normochromic normocytic anemia    Transient ischemic attack. Workup is initiated. She will need to be admitted for full transient ischemic attack workup.  CT shows questionable subacute infarct in the left temporal lobe. This does not match with where her neurologic symptoms were and  that I did not feel is related to her transient ischemic attack. MRI has been ordered. Case has been discussed with Dr. Myna Hidalgo of [facility] agrees to admit the patient. Cases been discussed with Dr. Janann Colonel of [facility] who agrees to see the patient in consultation.  Delora Fuel, MD 22/57/50 [id number]

## 2015-03-22 NOTE — ED Notes (Signed)
Pt presents from [facility]. Pt reporting left upper extremity weakness that started today at 1730. Pt also reporting left side numbness from left hand to left face. Pt reporting numbness resolved within an hour. Pt is neurologically intact, and A/O. Pt denies any CP, N/V, visual changes or SOB. Pt has a history of DM.

## 2015-03-22 NOTE — ED Notes (Signed)
Pt to MRI

## 2015-03-22 NOTE — ED Notes (Signed)
Attempted report X1

## 2015-03-22 NOTE — Progress Notes (Signed)
Received report from [place] in the ED. Pt currently in MRI.

## 2015-03-22 NOTE — ED Notes (Signed)
Lab called, not enough blood in blue top. Please redraw

## 2015-03-22 NOTE — Consult Note (Signed)
Stroke Consult Consulting Physician: Dr Roxanne Mins ED  Chief Complaint: left hand weakness  HPI: Angelica Benson is an 81 y.o. female hx of HTN, DM presenting with transient LUE weakness. She notes around 1700 she developed difficulty using her left hand. At that time also notes pain shooting from left hand all the way up to her head Symptoms lasted around 5-10 minutes. Denies any numbness, speech or visual deficits. Currently asymptomatic. No prior TIA or CVA.   Head CT imaging reviewed, overall unremarkable though question of subacute infarct in the left medial temporal lobe vs possible mass lesion  Date last known well: 03/22/2015 Time last known well: 1700 tPA Given: no, symptoms resolved Modified Rankin: Rankin Score=0  Past Medical History  Diagnosis Date  . Arthritis   . Asthma   . CHF (congestive heart failure) ([place])   . Hypertension   . Diabetes mellitus without complication ([place])   . Chronic headaches   . Constipation   . Hemorrhoids without complication     Past Surgical History  Procedure Laterality Date  . Cholecystectomy    . Back surgery    . Knee surgery    . Abdominal hysterectomy      Family History  Problem Relation Age of Onset  . Diabetes Son   . Hypertension Son    Social History:  reports that she has never smoked. She has never used smokeless tobacco. She reports that she does not drink alcohol or use illicit drugs.  Allergies:  Allergies  Allergen Reactions  . Morphine And Related Rash     (Not in a hospital admission)  ROS: Out of a complete 14 system review, the patient complains of only the following symptoms, and all other reviewed systems are negative. +weakness  Physical Examination: Filed Vitals:   03/22/15 2001 03/22/15 2015  BP:  144/47  Pulse:  32  Temp: 98.3 F (36.8 C)   Resp:  13   Physical Exam  Constitutional: He appears well-developed and well-nourished.  Psych: Affect appropriate to situation Eyes: No scleral  injection HENT: No OP obstrucion Head: Normocephalic.  Cardiovascular: Normal rate and regular rhythm.  Respiratory: Effort normal and breath sounds normal.  GI: Soft. Bowel sounds are normal. No distension. There is no tenderness.  Skin: WDI   Neurologic Examination: Mental Status: Alert, oriented, thought content appropriate.  Speech fluent without evidence of aphasia.  Able to follow 3 step commands without difficulty. Cranial Nerves: II: funduscopic exam wnl bilaterally, visual fields grossly normal, pupils equal, round, reactive to light and accommodation III,IV, VI: ptosis not present, extra-ocular motions intact bilaterally V,VII: smile symmetric, facial light touch sensation normal bilaterally VIII: hearing normal bilaterally IX,X: gag reflex present XI: trapezius strength/neck flexion strength normal bilaterally XII: tongue strength normal  Motor: Right : Upper extremity    Left:     Upper extremity 5/5 deltoid       5/5 deltoid 5/5 biceps      5/5 biceps  5/5 triceps      5/5 triceps 5/5 hand grip      5/5 hand grip  Lower extremity     Lower extremity Bilateral LE proximal weakness, distal full strength Tone and bulk:normal tone throughout; no atrophy noted Sensory: Pinprick and light touch intact throughout, bilaterally Deep Tendon Reflexes: 2+ and symmetric throughout Plantars: Right: downgoing   Left: downgoing Cerebellar: normal finger-to-nose, difficulty testing HTS Gait: deferred  Laboratory Studies:   Basic Metabolic Panel:  Recent Labs Lab 03/20/15 0921 03/22/15 2003  03/22/15 2010  NA 137 139 138  K 3.9 4.4 4.2  CL 101 104 104  CO2 29 25  --   GLUCOSE 105* 112* 105*  BUN 18 18 24*  CREATININE 0.86 1.04* 0.90  CALCIUM 9.7 9.7  --     Liver Function Tests:  Recent Labs Lab 03/22/15 2003  AST 24  ALT 11*  ALKPHOS 62  BILITOT 0.5  PROT 7.0  ALBUMIN 3.8   No results for input(s): LIPASE, AMYLASE in the last 168 hours. No results  for input(s): AMMONIA in the last 168 hours.  CBC:  Recent Labs Lab 03/22/15 2003 03/22/15 2010  WBC 6.1  --   NEUTROABS 2.8  --   HGB 11.3* 13.3  HCT 34.6* 39.0  MCV 90.1  --   PLT 217  --     Cardiac Enzymes: No results for input(s): CKTOTAL, CKMB, CKMBINDEX, TROPONINI in the last 168 hours.  BNP: Invalid input(s): POCBNP  CBG:  Recent Labs Lab 03/22/15 2019  GLUCAP 6    Microbiology: Results for orders placed or performed during the hospital encounter of 03/14/10  Urine culture     Status: None   Collection Time: 03/14/10  9:36 PM  Result Value Ref Range Status   Specimen Description URINE, CLEAN CATCH  Final   Special Requests NONE  Final   Culture  Setup Time [id number]  Final   Colony Count NO GROWTH  Final   Culture NO GROWTH  Final   Report Status 03/15/2010 FINAL  Final    Coagulation Studies: No results for input(s): LABPROT, INR in the last 72 hours.  Urinalysis: No results for input(s): COLORURINE, LABSPEC, PHURINE, GLUCOSEU, HGBUR, BILIRUBINUR, KETONESUR, PROTEINUR, UROBILINOGEN, NITRITE, LEUKOCYTESUR in the last 168 hours.  Invalid input(s): APPERANCEUR  Lipid Panel:     Component Value Date/Time   CHOL 148 11/16/2014 0914   TRIG 98.0 11/16/2014 0914   HDL 59.20 11/16/2014 0914   CHOLHDL 3 11/16/2014 0914   VLDL 19.6 11/16/2014 0914   LDLCALC 70 11/16/2014 0914    HgbA1C:  Lab Results  Component Value Date   HGBA1C 6.4 03/20/2015    Urine Drug Screen:  No results found for: LABOPIA, COCAINSCRNUR, LABBENZ, AMPHETMU, THCU, LABBARB  Alcohol Level: No results for input(s): ETH in the last 168 hours.  Other results:  Imaging: Ct Head Wo Contrast  03/22/2015  CLINICAL DATA:  Left upper extremity numbness and weakness EXAM: CT HEAD WITHOUT CONTRAST TECHNIQUE: Contiguous axial images were obtained from the base of the skull through the vertex without intravenous contrast. COMPARISON:  February 07, 2013 FINDINGS: There is age related  volume loss. There is no intracranial hemorrhage, extra-axial fluid collection, or midline shift. Patchy small vessel disease in the centra semiovale bilaterally is stable. A well-defined mass is not appreciable. There is a focal area of decreased attenuation in the medial left temporal lobe slightly superior and lateral to the quadrigeminal plate cistern on the left, concerning for recent infarct. No other evidence suggesting recent infarct. The bony calvarium appears intact. The visualized mastoid air cells are clear. No intraorbital lesions are identified. IMPRESSION: Suspect recent infarct in the medial left temporal lobe just superior and lateral to the left quadrigeminal plate cistern. This finding is best seen on axial slice 12 series 123456. Elsewhere, there is age related volume loss with patchy periventricular small vessel disease. No hemorrhage. Comment: An atypical mass with edema in the medial left temporal lobe could present in this manner. Given this  finding, it would be prudent to correlate with pre and post-contrast brain MRI. Electronically Signed   By: Lowella Grip III M.D.   On: 03/22/2015 21:27    Assessment: 81 y.o. female with hx of HTN, DM admitted with transient episode of LUE weakness, associated with pain. Currently asymptomatic. TIA cannot be ruled out. Cervical radiculopathy would also be in the differential.   Plan: 1. HgbA1c, fasting lipid panel 2. MRI brain with and without contrast, MRA head 3. PT consult, OT consult, Speech consult 4. Echocardiogram 5. Carotid dopplers 6. Prophylactic therapy-ASA 325mg  7. Risk factor modification 8. Telemetry monitoring 9. Frequent neuro checks 10. NPO until RN stroke swallow screen   Jim Like, DO [facility] [phone]  If 7pm- 7am, please page neurology on call as listed in [place]. 03/22/2015, 9:53 PM

## 2015-03-22 NOTE — ED Notes (Signed)
Neurology at bedside.

## 2015-03-23 ENCOUNTER — Observation Stay (HOSPITAL_COMMUNITY): Payer: Medicare Other

## 2015-03-23 ENCOUNTER — Observation Stay (HOSPITAL_BASED_OUTPATIENT_CLINIC_OR_DEPARTMENT_OTHER): Payer: Medicare Other

## 2015-03-23 DIAGNOSIS — G458 Other transient cerebral ischemic attacks and related syndromes: Secondary | ICD-10-CM

## 2015-03-23 DIAGNOSIS — M5010 Cervical disc disorder with radiculopathy, unspecified cervical region: Secondary | ICD-10-CM | POA: Diagnosis not present

## 2015-03-23 DIAGNOSIS — E1159 Type 2 diabetes mellitus with other circulatory complications: Secondary | ICD-10-CM | POA: Diagnosis not present

## 2015-03-23 DIAGNOSIS — G459 Transient cerebral ischemic attack, unspecified: Secondary | ICD-10-CM

## 2015-03-23 DIAGNOSIS — E119 Type 2 diabetes mellitus without complications: Secondary | ICD-10-CM

## 2015-03-23 DIAGNOSIS — I10 Essential (primary) hypertension: Secondary | ICD-10-CM | POA: Diagnosis not present

## 2015-03-23 DIAGNOSIS — D649 Anemia, unspecified: Secondary | ICD-10-CM | POA: Insufficient documentation

## 2015-03-23 DIAGNOSIS — R531 Weakness: Secondary | ICD-10-CM

## 2015-03-23 DIAGNOSIS — G451 Carotid artery syndrome (hemispheric): Secondary | ICD-10-CM | POA: Diagnosis not present

## 2015-03-23 DIAGNOSIS — M6289 Other specified disorders of muscle: Secondary | ICD-10-CM | POA: Diagnosis not present

## 2015-03-23 LAB — LIPID PANEL
Cholesterol: 139 mg/dL (ref 0–200)
HDL: 56 mg/dL (ref >40)
LDL Cholesterol: 70 mg/dL (ref 0–99)
Total CHOL/HDL Ratio: 2.5 RATIO
Triglycerides: 67 mg/dL (ref <150)
VLDL: 13 mg/dL (ref 0–40)

## 2015-03-23 LAB — CBC
HEMATOCRIT: 32.9 % — AB (ref 36.0–46.0)
HEMOGLOBIN: 10.5 g/dL — AB (ref 12.0–15.0)
MCH: 28.8 pg (ref 26.0–34.0)
MCHC: 31.9 g/dL (ref 30.0–36.0)
MCV: 90.1 fL (ref 78.0–100.0)
Platelets: 198 10*3/uL (ref 150–400)
RBC: 3.65 MIL/uL — AB (ref 3.87–5.11)
RDW: 14.3 % (ref 11.5–15.5)
WBC: 6.4 10*3/uL (ref 4.0–10.5)

## 2015-03-23 LAB — GLUCOSE, CAPILLARY
GLUCOSE-CAPILLARY: 74 mg/dL (ref 65–99)
GLUCOSE-CAPILLARY: 82 mg/dL (ref 65–99)
GLUCOSE-CAPILLARY: 143 mg/dL — AB (ref 65–99)
Glucose-Capillary: 75 mg/dL (ref 65–99)

## 2015-03-23 LAB — CREATININE, SERUM
Creatinine, Ser: 0.97 mg/dL (ref 0.44–1.00)
GFR, EST NON AFRICAN AMERICAN: 52 mL/min — AB (ref >60)

## 2015-03-23 LAB — PROTIME-INR
INR: 1.03 (ref 0.00–1.49)
PROTHROMBIN TIME: 13.7 s (ref 11.6–15.2)

## 2015-03-23 MED ORDER — MOMETASONE FURO-FORMOTEROL FUM 200-5 MCG/ACT IN AERO
2.0000 | INHALATION_SPRAY | Freq: Two times a day (BID) | RESPIRATORY_TRACT | Status: DC
  Administered 2015-03-23 – 2015-03-25 (×5): 2 via RESPIRATORY_TRACT
  Filled 2015-03-23: qty 8.8

## 2015-03-23 MED ORDER — SENNOSIDES-DOCUSATE SODIUM 8.6-50 MG PO TABS: 1.0000 | ORAL_TABLET | Freq: Every evening | ORAL | Status: DC | PRN

## 2015-03-23 MED ORDER — TRAZODONE HCL 50 MG PO TABS
50.0000 mg | ORAL_TABLET | Freq: Every day | ORAL | Status: DC
  Administered 2015-03-23 – 2015-03-24 (×3): 50 mg via ORAL
  Filled 2015-03-23 (×3): qty 1

## 2015-03-23 MED ORDER — HYDROCHLOROTHIAZIDE 25 MG PO TABS
12.5000 mg | ORAL_TABLET | Freq: Every day | ORAL | Status: DC
  Administered 2015-03-23 – 2015-03-25 (×3): 12.5 mg via ORAL
  Filled 2015-03-23 (×3): qty 1

## 2015-03-23 MED ORDER — INSULIN ASPART 100 UNIT/ML ~~LOC~~ SOLN
0.0000 [IU] | Freq: Three times a day (TID) | SUBCUTANEOUS | Status: DC
  Administered 2015-03-23: 2 [IU] via SUBCUTANEOUS
  Administered 2015-03-25: 8 [IU] via SUBCUTANEOUS

## 2015-03-23 MED ORDER — STROKE: EARLY STAGES OF RECOVERY BOOK
Freq: Once | Status: AC
  Administered 2015-03-23: 06:00:00
  Filled 2015-03-23: qty 1

## 2015-03-23 MED ORDER — ASPIRIN 300 MG RE SUPP
300.0000 mg | Freq: Every day | RECTAL | Status: DC
  Filled 2015-03-23: qty 1

## 2015-03-23 MED ORDER — ALBUTEROL SULFATE (2.5 MG/3ML) 0.083% IN NEBU: 3.0000 mL | INHALATION_SOLUTION | Freq: Four times a day (QID) | RESPIRATORY_TRACT | Status: DC | PRN

## 2015-03-23 MED ORDER — ENOXAPARIN SODIUM 40 MG/0.4ML ~~LOC~~ SOLN
40.0000 mg | SUBCUTANEOUS | Status: DC
  Administered 2015-03-23 – 2015-03-25 (×3): 40 mg via SUBCUTANEOUS
  Filled 2015-03-23 (×3): qty 0.4

## 2015-03-23 MED ORDER — GABAPENTIN 100 MG PO CAPS
100.0000 mg | ORAL_CAPSULE | Freq: Two times a day (BID) | ORAL | Status: DC
  Administered 2015-03-23 – 2015-03-25 (×6): 100 mg via ORAL
  Filled 2015-03-23 (×6): qty 1

## 2015-03-23 MED ORDER — ACETAMINOPHEN 500 MG PO TABS
1000.0000 mg | ORAL_TABLET | Freq: Three times a day (TID) | ORAL | Status: DC | PRN
  Administered 2015-03-24: 1000 mg via ORAL
  Filled 2015-03-23: qty 2

## 2015-03-23 MED ORDER — GADOBENATE DIMEGLUMINE 529 MG/ML IV SOLN
20.0000 mL | Freq: Once | INTRAVENOUS | Status: AC
  Administered 2015-03-23: 20 mL via INTRAVENOUS

## 2015-03-23 MED ORDER — DICLOFENAC SODIUM 1 % TD GEL
2.0000 g | Freq: Four times a day (QID) | TRANSDERMAL | Status: DC | PRN
  Filled 2015-03-23: qty 100

## 2015-03-23 MED ORDER — ASPIRIN 325 MG PO TABS
325.0000 mg | ORAL_TABLET | Freq: Every day | ORAL | Status: DC
  Administered 2015-03-23 – 2015-03-25 (×3): 325 mg via ORAL
  Filled 2015-03-23 (×3): qty 1

## 2015-03-23 MED ORDER — FLUTICASONE PROPIONATE 50 MCG/ACT NA SUSP
2.0000 | Freq: Every day | NASAL | Status: DC
  Administered 2015-03-23 – 2015-03-25 (×3): 2 via NASAL
  Filled 2015-03-23: qty 16

## 2015-03-23 NOTE — Care Management Note (Signed)
Case Management Note  Patient Details  Name: RANITA SPRINGER MRN: [id number] Date of Birth: [birth year]  Subjective/Objective:                  Date-03-23-15 Initial Assessment Spoke with patient at the bedside along with daughter Jackelyn Poling.  Introduced self as Tourist information centre manager and explained role in discharge planning and how to be reached.  Verified patient lives at [facility] in [place] alone.  Verified patient anticipates to go home with alone, at time of discharge and will have part-time supervision by [facility] through [place] that comes 6 days a week at this time to best of their knowledge. .  Patient denied  needing help with their medication.  Patient is driven by family to MD appointments.  Verified patient has PCP. Patient states they currently receive [place].  Patient was provided choice and selected [facility] for home health needs if they arise. May need PT.  Plan: CM will continue to follow for discharge planning and [facility] resources.   Carles Collet RN BSN CM [phone]   Action/Plan:   Expected Discharge Date:                  Expected Discharge Plan:  [place]  In-House Referral:  Clinical Social Work  Discharge planning Services  CM Consult  Post Acute Care Choice:    Choice offered to:     DME Arranged:    DME Agency:     HH Arranged:    [place] Agency:     Status of Service:  In process, will continue to follow  Medicare Important Message Given:    Date Medicare IM Given:    Medicare IM give by:    Date Additional Medicare IM Given:    Additional Medicare Important Message give by:     If discussed at [place] of Stay Meetings, dates discussed:    Additional Comments:  Carles Collet, RN 03/23/2015, 2:56 PM

## 2015-03-23 NOTE — Care Management Obs Status (Signed)
[place] NOTIFICATION   Patient Details  Name: Angelica Benson MRN: [id number] Date of Birth: [birth year]   Medicare Observation Status Notification Given:  Yes    Carles Collet, RN 03/23/2015, 2:55 PM

## 2015-03-23 NOTE — Progress Notes (Signed)
BAILEYANN DENGLER is a 81 y.o. female patient admitted from ED awake, alert - oriented  X 4 - no acute distress noted.  VSS - Blood pressure 140/51, pulse 78, temperature 98.1 F (36.7 C), temperature source Oral, resp. rate 20, height 5' (1.524 m), weight 102.513 kg (226 lb), SpO2 98 %.    IV in place, occlusive dsg intact without redness.  Orientation to room, and floor completed with information packet given to patient/family.  Patient declined safety video at this time.  Admission INP armband ID verified with patient/family, and in place.   SR up x 2, fall assessment complete, with patient and family able to verbalize understanding of risk associated with falls, and verbalized understanding to call nsg before up out of bed.  Call light within reach, patient able to voice, and demonstrate understanding.  Skin, clean-dry- intact without evidence of bruising, or skin tears.   No evidence of skin break down noted on exam.     Will cont to eval and treat per MD orders.  Elon Jester, RN 03/23/2015 12:30 AM

## 2015-03-23 NOTE — H&P (Signed)
[facility] History and Physical  SHIRELLE TOOTLE [id number] DOB: 1929/02/08 DOA: 03/22/2015  Referring physician: ED physician PCP: Philis Fendt, MD  Specialists: Dr. Dwyane Dee, endocrine  Chief Complaint: Lt arm weakness.   HPI: Angelica Benson is a 81 y.o. female with PMH of well-controlled diabetes, chronic diastolic CHF, and asthma, who presents to the ED from [facility] independent living facility with complaint of left arm weakness since approximately 5:30 PM on 03/22/2015. She also described paresthesias of the entire left arm and extending to the left neck. She states that the weakness resolved spontaneously within about an hour but intermittent paresthesias from the left hand to left neck continued. Paresthesias were described as brief, fleeting. She denies having similar symptoms previously and has had no confusion, loss of coordination, or other focal weakness or numbness. She denied palpitations, visual disturbance, or recent trauma.  In ED, patient was found to be afebrile with vital signs stable. Initial blood work including chem panel and CBC were largely unremarkable, and troponin was undetectable. EKG revealed a sinus rhythm with right bundle-branch block, and was unchanged from the prior study last year. CT of the head was obtained and a finding in the left temporal lobe was noted, suggestive of a recent infarct, or less likely a mass with edema. Dr. Janann Colonel of neurology was consulted from the emergency department and kindly gave his recommendations. The patient was sent for MRI brain and the report is pending at time of this dictation. She was admitted under observation for further evaluation of her symptoms.  Where does patient live?  [facility] Can patient participate in ADLs?  Yes       Review of Systems:   General: no fevers, chills, sweats, weight change, poor appetite, or fatigue HEENT: no blurry vision, hearing changes or sore throat Pulm: no  dyspnea, cough, or wheeze CV: no chest pain or palpitations Abd: no nausea, vomiting, abdominal pain, diarrhea, or constipation GU: no dysuria, hematuria, increased urinary frequency, or urgency  Ext: no new leg edema Neuro:See HPI no vision change or hearing loss Skin: no rash, no wounds MSK: No muscle spasm, no deformity, no red, hot, or swollen joint Heme: No easy bruising or bleeding Travel history: No recent long distant travel    Allergy:  Allergies  Allergen Reactions  . Morphine And Related Rash    Past Medical History  Diagnosis Date  . Arthritis   . Asthma   . CHF (congestive heart failure) ([place])   . Hypertension   . Diabetes mellitus without complication ([place])   . Chronic headaches   . Constipation   . Hemorrhoids without complication     Past Surgical History  Procedure Laterality Date  . Cholecystectomy    . Back surgery    . Knee surgery    . Abdominal hysterectomy      Social History:  reports that she has never smoked. She has never used smokeless tobacco. She reports that she does not drink alcohol or use illicit drugs.  Family History:  Family History  Problem Relation Age of Onset  . Diabetes Son   . Hypertension Son      Prior to Admission medications   Medication Sig Start Date End Date Taking? Authorizing Provider  acetaminophen (TYLENOL) 500 MG tablet Take 1,000 mg by mouth 2 (two) times daily.    Yes Historical Provider, MD  albuterol (PROVENTIL HFA;VENTOLIN HFA) 108 (90 BASE) MCG/ACT inhaler Inhale 2 puffs into the lungs every 6 (six)  hours as needed for wheezing or shortness of breath.   Yes Historical Provider, MD  aspirin 81 MG chewable tablet Chew 81 mg by mouth daily.   Yes Historical Provider, MD  DULERA 200-5 MCG/ACT AERO Inhale 2 puffs into the lungs 2 (two) times daily.  07/15/14  Yes Historical Provider, MD  gabapentin (NEURONTIN) 100 MG capsule Take 100 mg by mouth 2 (two) times daily.  10/28/13  Yes Historical Provider, MD   hydrochlorothiazide (HYDRODIURIL) 25 MG tablet Take 12.5 mg by mouth daily.    Yes Historical Provider, MD  metFORMIN (GLUCOPHAGE) 500 MG tablet Take 500 mg by mouth 2 (two) times daily with a meal.   Yes Historical Provider, MD  permethrin (ELIMITE) 5 % cream Apply 1 application topically daily as needed (for irritation).  03/01/15  Yes Historical Provider, MD  potassium chloride SA (K-DUR,KLOR-CON) 20 MEQ tablet Take 20 mEq by mouth daily.   Yes Historical Provider, MD  sitaGLIPtin (JANUVIA) 100 MG tablet Take 100 mg by mouth daily.   Yes Historical Provider, MD  traZODone (DESYREL) 50 MG tablet Take 50 mg by mouth at bedtime.  11/16/14  Yes Historical Provider, MD  triamcinolone cream (KENALOG) 0.5 % Apply 1 application topically daily as needed (for rash).  03/01/15  Yes Historical Provider, MD  Vitamin D, Ergocalciferol, (DRISDOL) 50000 UNITS CAPS capsule TAKE 1 CAPSULE BY MOUTH TWICE A WEEK Patient taking differently: TAKE 1 CAPSULE BY MOUTH ONCE WEEKLY ON SATURDAY 02/24/14  Yes Elayne Snare, MD  VOLTAREN 1 % GEL Apply 2 g topically 4 (four) times daily as needed (for pain).  10/31/14  Yes Historical Provider, MD  Blood Glucose Monitoring Suppl (ONE TOUCH ULTRA 2) W/DEVICE KIT Use to check blood sugar daily dx code 250.00 07/07/13   Elayne Snare, MD  fluticasone (FLONASE) 50 MCG/ACT nasal spray Place 2 sprays into the nose daily. 02/07/13   Blanchie Dessert, MD  Lancets (FREESTYLE) lancets Use as instructed to check blood sugars once a day dx code 250.00 06/23/13   Elayne Snare, MD  ONE TOUCH ULTRA TEST test strip USE TO CHECK BLOOD SUGAR DAILY AS DIRECTED 08/08/14   Elayne Snare, MD  [facility] DELICA LANCETS 48N MISC USE AS DIRECTED TO CHECK BLOOD SUGAR EVERY DAY 12/30/13   Elayne Snare, MD    Physical Exam: Filed Vitals:   03/22/15 2015 03/22/15 2200 03/22/15 2230 03/23/15 0011  BP: 144/47 100/75 185/61 144/65  Pulse: 32 79  84  Temp:    98.2 F (36.8 C)  TempSrc:    Oral  Resp: 13 23  20   Height:       Weight:      SpO2: 86% 98%  100%   General: Not in acute distress HEENT:       Eyes: PERRL, EOMI, no scleral icterus or conjunctival pallor.       ENT: No discharge from the ears or nose, no pharyngeal ulcers, petechiae or exudate, no tonsillar enlargement.        Neck: No JVD, no bruit, no appreciable mass Heme: No cervical adenopathy, no pallor Cardiac: S1/S2, RRR, No murmurs, No gallops or rubs. Pulm: Good air movement bilaterally. No rales, wheezing, rhonchi or rubs. Abd: Soft, nondistended, nontender, no rebound pain or gaurding, no mass or organomegaly, BS present. Ext: Trace pitting LE edema bilaterally. 2+DP/PT pulse bilaterally. Musculoskeletal: No gross deformity, no red, hot, swollen joints, no limitation in ROM  Skin: No rashes or wounds on exposed surfaces  Neuro: Alert, oriented X3,  cranial nerves II-XII grossly intact, muscle strength 5/5 in all extremities, sensation to light touch intact. Brachial reflex 2+ bilaterally. Knee reflex 2+ bilaterally. Negative Babinski's sign. Normal finger to nose test. No focal findings Psych: Patient is not overtly psychotic.  Labs on Admission:  Basic Metabolic Panel:  Recent Labs Lab 03/20/15 0921 03/22/15 2003 03/22/15 2010  NA 137 139 138  K 3.9 4.4 4.2  CL 101 104 104  CO2 29 25  --   GLUCOSE 105* 112* 105*  BUN 18 18 24*  CREATININE 0.86 1.04* 0.90  CALCIUM 9.7 9.7  --    Liver Function Tests:  Recent Labs Lab 03/22/15 2003  AST 24  ALT 11*  ALKPHOS 62  BILITOT 0.5  PROT 7.0  ALBUMIN 3.8   No results for input(s): LIPASE, AMYLASE in the last 168 hours. No results for input(s): AMMONIA in the last 168 hours. CBC:  Recent Labs Lab 03/22/15 2003 03/22/15 2010  WBC 6.1  --   NEUTROABS 2.8  --   HGB 11.3* 13.3  HCT 34.6* 39.0  MCV 90.1  --   PLT 217  --    Cardiac Enzymes: No results for input(s): CKTOTAL, CKMB, CKMBINDEX, TROPONINI in the last 168 hours.  BNP (last 3 results) No results for  input(s): BNP in the last 8760 hours.  ProBNP (last 3 results) No results for input(s): PROBNP in the last 8760 hours.  CBG:  Recent Labs Lab 03/22/15 2019  GLUCAP 85    Radiological Exams on Admission: Ct Head Wo Contrast  03/22/2015  CLINICAL DATA:  Left upper extremity numbness and weakness EXAM: CT HEAD WITHOUT CONTRAST TECHNIQUE: Contiguous axial images were obtained from the base of the skull through the vertex without intravenous contrast. COMPARISON:  February 07, 2013 FINDINGS: There is age related volume loss. There is no intracranial hemorrhage, extra-axial fluid collection, or midline shift. Patchy small vessel disease in the centra semiovale bilaterally is stable. A well-defined mass is not appreciable. There is a focal area of decreased attenuation in the medial left temporal lobe slightly superior and lateral to the quadrigeminal plate cistern on the left, concerning for recent infarct. No other evidence suggesting recent infarct. The bony calvarium appears intact. The visualized mastoid air cells are clear. No intraorbital lesions are identified. IMPRESSION: Suspect recent infarct in the medial left temporal lobe just superior and lateral to the left quadrigeminal plate cistern. This finding is best seen on axial slice 12 series 818. Elsewhere, there is age related volume loss with patchy periventricular small vessel disease. No hemorrhage. Comment: An atypical mass with edema in the medial left temporal lobe could present in this manner. Given this finding, it would be prudent to correlate with pre and post-contrast brain MRI. Electronically Signed   By: Lowella Grip III M.D.   On: 03/22/2015 21:27    EKG: Independently reviewed.  Abnormal findings:   Sinus rhythm with RBBB, unchanged from prior.   Assessment/Plan 1. Left arm weakness Weakness had resolved spontaneously within approximately one hour, but some brief and transient paresthesias remain present from the left to  left neck. There was concern for TIA/CVA and while CT  featured findings suggestive of a infarct, the location of these findings does not correlate with the patient's reported symptoms. Dr. Janann Colonel of neurology was consulted from the emergency department and is kindly agreed to see the patient. He recommended MRI brain which she has been performed but the report remains pending. Patient will be monitored on  telemetry overnight. Tight glucose control will be attempted and any fever will be treated. There are no focal findings on exam and the patient will be allowed a carbohydrate consistent diet. We will follow up the MRI findings and recommendations of consultant.   2.Diabetes mellitus  Patient has type 2 diabetes with hemoglobin A1c of 6.4% on 03/20/2015, demonstrating excellent control. Home anti-diabetic agents will be held while in house and she will be placed on a sliding scale insulin protocol. She'll be placed on a sensitive correctional insulin initially with adjustments made as needed.  3. Asthma  Patient has history of asthma and will be continued on her home breathing treatments. She is saturating in the high 90s on room air with no respiratory distress or wheezes.   DVT ppx:  SQ Lovenox     Code Status: DNR Family Communication: None at bed side.        Disposition Plan: Admit to observation   Date of Service 03/23/2015    Ilene Qua Opyd [facility] Pager [phone]  If 7PM-7AM, please contact night-coverage [email or web address] Password [email or web address] 03/23/2015, 12:56 AM

## 2015-03-23 NOTE — Progress Notes (Signed)
[facility] PROGRESS NOTE    Progress Note   Angelica Benson [id number] DOB: [birth year] DOA: 03/22/2015 PCP: Philis Fendt, MD   Brief Narrative:   Angelica Benson is an 81 y.o. female PMH of well-controlled diabetes, chronic diastolic CHF, and asthma, who presents to the ED from [facility]  independent living facility with complaint of left arm weakness since approximately 5:30 PM on 03/22/2015.   Assessment/Plan:   TIA (transient ischemic attack): HgbA1c at goal, fasting lipid panel pending MRI, MRA of the brain without contrast showed no acute strokes PT, OT, Speech consult pending Echocardiogram and Carotid dopplers pending Prophylactic therapy-Antiplatelet med: Aspirin - dose 325 mg PO daily  Avoid D5 fluids as may be harmfull risk factor modification  Cardiac Monitoring with no events Neurochecks q4h  Keep MAP 70  HYPERTENSION, BENIGN: Has been elevated and we'll continue to monitor, allow permissive hypertension.  Type 2 diabetes mellitus ([name]): - Hemoglobin A1c 6.4    DVT Prophylaxis - Lovenox ordered.  Family Communication: none Disposition Plan: Home when stable. Code Status:     Code Status Orders        Start     Ordered   03/23/15 0048  Do not attempt resuscitation (DNR)   Continuous    Question Answer Comment  In the event of cardiac or respiratory ARREST Do not call a "code blue"   In the event of cardiac or respiratory ARREST Do not perform Intubation, CPR, defibrillation or ACLS   In the event of cardiac or respiratory ARREST Use medication by any route, position, wound care, and other measures to relive pain and suffering. May use oxygen, suction and manual treatment of airway obstruction as needed for comfort.      03/23/15 0053    Advance Directive Documentation        Most Recent Value   Type of Advance Directive  Living will   Pre-existing out of facility DNR order (yellow form or pink MOST form)     "MOST" Form in Place?          IV Access:    Peripheral IV   Procedures and diagnostic studies:   Dg Chest 2 View  03/23/2015  CLINICAL DATA:  Weakness EXAM: CHEST  2 VIEW COMPARISON:  November 30, 2013 FINDINGS: There is no edema or consolidation. The heart size and pulmonary vascularity are normal. No adenopathy. There is degenerative change in the thoracic spine with diffuse idiopathic skeletal hyperostosis. IMPRESSION: No edema or consolidation. Electronically Signed   By: Lowella Grip III M.D.   On: 03/23/2015 08:57   Ct Head Wo Contrast  03/22/2015  CLINICAL DATA:  Left upper extremity numbness and weakness EXAM: CT HEAD WITHOUT CONTRAST TECHNIQUE: Contiguous axial images were obtained from the base of the skull through the vertex without intravenous contrast. COMPARISON:  February 07, 2013 FINDINGS: There is age related volume loss. There is no intracranial hemorrhage, extra-axial fluid collection, or midline shift. Patchy small vessel disease in the centra semiovale bilaterally is stable. A well-defined mass is not appreciable. There is a focal area of decreased attenuation in the medial left temporal lobe slightly superior and lateral to the quadrigeminal plate cistern on the left, concerning for recent infarct. No other evidence suggesting recent infarct. The bony calvarium appears intact. The visualized mastoid air cells are clear. No intraorbital lesions are identified. IMPRESSION: Suspect recent infarct in the medial left temporal lobe just superior and lateral to the left quadrigeminal plate cistern. This finding is  best seen on axial slice 12 series 123456. Elsewhere, there is age related volume loss with patchy periventricular small vessel disease. No hemorrhage. Comment: An atypical mass with edema in the medial left temporal lobe could present in this manner. Given this finding, it would be prudent to correlate with pre and post-contrast brain MRI. Electronically Signed   By: Lowella Grip III M.D.   On:  03/22/2015 21:27   Mr Angiogram Head Wo Contrast  03/23/2015  CLINICAL DATA:  Initial evaluation for transient left hand weakness. EXAM: MRI HEAD WITHOUT AND WITH CONTRAST MRA HEAD WITHOUT CONTRAST TECHNIQUE: Multiplanar, multiecho pulse sequences of the brain and surrounding structures were obtained without and with intravenous contrast. Angiographic images of the head were obtained using MRA technique without contrast. CONTRAST:  20 cc of MultiHance. COMPARISON:  Prior head CT from 03/22/2015. FINDINGS: MRI HEAD FINDINGS No abnormal foci of restricted diffusion to suggest acute intracranial infarct. Gray-white matter differentiation maintained. Normal intravascular flow voids preserved. No acute or chronic intracranial hemorrhage. No areas of chronic infarction. Mild diffuse prominence of the CSF containing spaces is compatible with generalized cerebral atrophy, normal for patient age. Patchy T2/FLAIR hyperintensity within the periventricular and deep white matter is most consistent with chronic small vessel ischemic disease, mild for age. Minimal small vessel changes present within the central pons. No mass lesion, midline shift, or mass effect. No hydrocephalus. No abnormal enhancement on post-contrast sequences. Craniocervical junction within normal limits. Mild degenerative spondylolysis without significant stenosis noted within the visualized upper cervical spine. Incidental note made of a partially empty sella. No acute abnormality about the orbits. Scattered mucosal thickening within the ethmoidal air cells and maxillary sinuses. Small retention cyst within the right maxillary sinus. No mastoid effusion. Inner ear structures within normal limits. Bone marrow signal intensity within normal limits. No scalp soft tissue abnormality. MRA HEAD FINDINGS ANTERIOR CIRCULATION: Visualized distal cervical segments of the internal carotid arteries are widely patent with antegrade flow. Petrous, cavernous, and  supraclinoid segments are widely patent. Origins of the ophthalmic arteries are normal. A1 segments, anterior communicating artery, and anterior cerebral arteries well opacified. M1 segments widely patent without stenosis or occlusion. MCA bifurcations normal. Distal MCA branches well opacified and symmetric. POSTERIOR CIRCULATION: Vertebral arteries are patent to the vertebrobasilar junction. Right vertebral artery slightly dominant. Posterior inferior cerebral arteries patent bilaterally. Basilar artery well opacified. Superior cerebellar arteries widely patent. Posterior cerebral arteries well opacified to their distal aspects. Prominent left posterior communicating artery noted. No aneurysm or vascular malformation. IMPRESSION: MRI HEAD IMPRESSION: 1. No acute intracranial infarct or other abnormality identified. 2. No mass lesion or abnormal enhancement within the brain. Previously questioned abnormality within the mesial left temporal lobe likely related to volume-averaging and patient positioning on prior CT. 3. Age appropriate cerebral atrophy with mild chronic small vessel ischemic disease. MRA HEAD IMPRESSION: Normal MRA of the intracranial circulation. No large or proximal arterial branch occlusion. Relatively little if any intracranial atherosclerotic disease for patient age. Electronically Signed   By: Jeannine Boga M.D.   On: 03/23/2015 02:33   Mr Jeri Cos [id number] Contrast  03/23/2015  CLINICAL DATA:  Initial evaluation for transient left hand weakness. EXAM: MRI HEAD WITHOUT AND WITH CONTRAST MRA HEAD WITHOUT CONTRAST TECHNIQUE: Multiplanar, multiecho pulse sequences of the brain and surrounding structures were obtained without and with intravenous contrast. Angiographic images of the head were obtained using MRA technique without contrast. CONTRAST:  20 cc of MultiHance. COMPARISON:  Prior head CT from 03/22/2015.  FINDINGS: MRI HEAD FINDINGS No abnormal foci of restricted diffusion to suggest  acute intracranial infarct. Gray-white matter differentiation maintained. Normal intravascular flow voids preserved. No acute or chronic intracranial hemorrhage. No areas of chronic infarction. Mild diffuse prominence of the CSF containing spaces is compatible with generalized cerebral atrophy, normal for patient age. Patchy T2/FLAIR hyperintensity within the periventricular and deep white matter is most consistent with chronic small vessel ischemic disease, mild for age. Minimal small vessel changes present within the central pons. No mass lesion, midline shift, or mass effect. No hydrocephalus. No abnormal enhancement on post-contrast sequences. Craniocervical junction within normal limits. Mild degenerative spondylolysis without significant stenosis noted within the visualized upper cervical spine. Incidental note made of a partially empty sella. No acute abnormality about the orbits. Scattered mucosal thickening within the ethmoidal air cells and maxillary sinuses. Small retention cyst within the right maxillary sinus. No mastoid effusion. Inner ear structures within normal limits. Bone marrow signal intensity within normal limits. No scalp soft tissue abnormality. MRA HEAD FINDINGS ANTERIOR CIRCULATION: Visualized distal cervical segments of the internal carotid arteries are widely patent with antegrade flow. Petrous, cavernous, and supraclinoid segments are widely patent. Origins of the ophthalmic arteries are normal. A1 segments, anterior communicating artery, and anterior cerebral arteries well opacified. M1 segments widely patent without stenosis or occlusion. MCA bifurcations normal. Distal MCA branches well opacified and symmetric. POSTERIOR CIRCULATION: Vertebral arteries are patent to the vertebrobasilar junction. Right vertebral artery slightly dominant. Posterior inferior cerebral arteries patent bilaterally. Basilar artery well opacified. Superior cerebellar arteries widely patent. Posterior cerebral  arteries well opacified to their distal aspects. Prominent left posterior communicating artery noted. No aneurysm or vascular malformation. IMPRESSION: MRI HEAD IMPRESSION: 1. No acute intracranial infarct or other abnormality identified. 2. No mass lesion or abnormal enhancement within the brain. Previously questioned abnormality within the mesial left temporal lobe likely related to volume-averaging and patient positioning on prior CT. 3. Age appropriate cerebral atrophy with mild chronic small vessel ischemic disease. MRA HEAD IMPRESSION: Normal MRA of the intracranial circulation. No large or proximal arterial branch occlusion. Relatively little if any intracranial atherosclerotic disease for patient age. Electronically Signed   By: Jeannine Boga M.D.   On: 03/23/2015 02:33     Medical Consultants:    None.  Anti-Infectives:   Anti-infectives    None      Subjective:    Angelica Benson she relates her symptoms have completely resolved.  Objective:    Filed Vitals:   03/23/15 0011 03/23/15 0214 03/23/15 0613 03/23/15 0900  BP: 144/65 140/51 117/33 161/64  Pulse: 84 78 71 68  Temp: 98.2 F (36.8 C) 98.1 F (36.7 C) 98 F (36.7 C)   TempSrc: Oral Oral Oral   Resp: 20     Height:      Weight:      SpO2: 100% 98% 98% 100%    Intake/Output Summary (Last 24 hours) at 03/23/15 1340 Last data filed at 03/23/15 1250  Gross per 24 hour  Intake      0 ml  Output    950 ml  Net   -950 ml   Filed Weights   03/22/15 1856  Weight: 102.513 kg (226 lb)    Exam: Gen:  NAD Cardiovascular:  RRR, No M/R/G Chest and lungs:   CTAB Abdomen:  Abdomen soft, NT/ND, + BS Extremities:  No C/E/C   Data Reviewed:    Labs: Basic Metabolic Panel:  Recent Labs Lab 03/20/15 0921 03/22/15 2003  03/22/15 2010 03/23/15 0143  NA 137 139 138  --   K 3.9 4.4 4.2  --   CL 101 104 104  --   CO2 29 25  --   --   GLUCOSE 105* 112* 105*  --   BUN 18 18 24*  --   CREATININE  0.86 1.04* 0.90 0.97  CALCIUM 9.7 9.7  --   --    GFR Estimated Creatinine Clearance: 46.6 mL/min (by C-G formula based on Cr of 0.97). Liver Function Tests:  Recent Labs Lab 03/22/15 2003  AST 24  ALT 11*  ALKPHOS 62  BILITOT 0.5  PROT 7.0  ALBUMIN 3.8   No results for input(s): LIPASE, AMYLASE in the last 168 hours. No results for input(s): AMMONIA in the last 168 hours. Coagulation profile  Recent Labs Lab 03/22/15 2219 03/23/15 0143  INR 1.02 1.03    CBC:  Recent Labs Lab 03/22/15 2003 03/22/15 2010 03/23/15 0143  WBC 6.1  --  6.4  NEUTROABS 2.8  --   --   HGB 11.3* 13.3 10.5*  HCT 34.6* 39.0 32.9*  MCV 90.1  --  90.1  PLT 217  --  198   Cardiac Enzymes: No results for input(s): CKTOTAL, CKMB, CKMBINDEX, TROPONINI in the last 168 hours. BNP (last 3 results) No results for input(s): PROBNP in the last 8760 hours. CBG:  Recent Labs Lab 03/22/15 2019 03/23/15 0944 03/23/15 1258  GLUCAP 85 74 82   D-Dimer: No results for input(s): DDIMER in the last 72 hours. Hgb A1c: No results for input(s): HGBA1C in the last 72 hours. Lipid Profile:  Recent Labs  03/23/15 0739  CHOL 139  HDL 56  LDLCALC 70  TRIG 67  CHOLHDL 2.5   Thyroid function studies: No results for input(s): TSH, T4TOTAL, T3FREE, THYROIDAB in the last 72 hours.  Invalid input(s): FREET3 Anemia work up: No results for input(s): VITAMINB12, FOLATE, FERRITIN, TIBC, IRON, RETICCTPCT in the last 72 hours. Sepsis Labs:  Recent Labs Lab 03/22/15 2003 03/23/15 0143  WBC 6.1 6.4   Microbiology No results found for this or any previous visit (from the past 240 hour(s)).   Medications:   . aspirin  300 mg Rectal Daily   Or  . aspirin  325 mg Oral Daily  . enoxaparin (LOVENOX) injection  40 mg Subcutaneous Q24H  . fluticasone  2 spray Each Nare Daily  . gabapentin  100 mg Oral BID  . hydrochlorothiazide  12.5 mg Oral Daily  . insulin aspart  0-15 Units Subcutaneous TID WC    . mometasone-formoterol  2 puff Inhalation BID  . traZODone  50 mg Oral QHS   Continuous Infusions:   Time spent: 25 min     Charlynne Cousins  [facility] Pager [phone]  *Please refer to [email or web address], password [email or web address] to get updated schedule on who will round on this patient, as hospitalists switch teams weekly. If 7PM-7AM, please contact night-coverage at [email or web address], password [email or web address] for any overnight needs.  03/23/2015, 1:40 PM

## 2015-03-23 NOTE — Progress Notes (Signed)
STROKE TEAM PROGRESS NOTE   HISTORY Angelica Benson is an 81 y.o. female hx of HTN, DM presenting with transient LUE weakness. She notes around 1700 03/22/2015 (KLW) she developed difficulty using her left hand. At that time also notes pain shooting from left hand all the way up to her head Symptoms lasted around 5-10 minutes. Denies any numbness, speech or visual deficits. Currently asymptomatic. No prior TIA or CVA. Head CT imaging reviewed, overall unremarkable though question of subacute infarct in the left medial temporal lobe vs possible mass lesion. Modified Rankin: Rankin Score=0. Patient was not administered TPA secondary to symptoms resolved. She was admitted for further evaluation and treatment.   SUBJECTIVE (INTERVAL HISTORY) Her RN is at the bedside.  Overall she feels her condition is stable. She feels her symptoms have resolved. States she has to get home to cook for Christmas.  Describes shooting pain from her fingertips to her neck with onset of weakness yesterday. Pain lasted only a few seconds. Weakness a few minutes to resolve. No hx fall. No HA yesterday. Reports intermittent neck/shoulder pain. Hx LLE pain from a ruptured lumbar disc that resolved.   OBJECTIVE Temp:  [98 F (36.7 C)-98.3 F (36.8 C)] 98 F (36.7 C) (12/08 [id number]) Pulse Rate:  [32-84] 68 (12/08 0900) Cardiac Rhythm:  [-] Normal sinus rhythm;Heart block;Bundle branch block (12/08 0702) Resp:  [13-23] 20 (12/08 0011) BP: (100-185)/(33-75) 161/64 mmHg (12/08 0900) SpO2:  [73 %-100 %] 100 % (12/08 0900) Weight:  [102.513 kg (226 lb)] 102.513 kg (226 lb) (12/07 1856)  CBC:   Recent Labs Lab 03/22/15 2003 03/22/15 2010 03/23/15 0143  WBC 6.1  --  6.4  NEUTROABS 2.8  --   --   HGB 11.3* 13.3 10.5*  HCT 34.6* 39.0 32.9*  MCV 90.1  --  90.1  PLT 217  --  99991111    Basic Metabolic Panel:   Recent Labs Lab 03/20/15 0921 03/22/15 2003 03/22/15 2010 03/23/15 0143  NA 137 139 138  --   K 3.9 4.4 4.2   --   CL 101 104 104  --   CO2 29 25  --   --   GLUCOSE 105* 112* 105*  --   BUN 18 18 24*  --   CREATININE 0.86 1.04* 0.90 0.97  CALCIUM 9.7 9.7  --   --     Lipid Panel:     Component Value Date/Time   CHOL 139 03/23/2015 0739   TRIG 67 03/23/2015 0739   HDL 56 03/23/2015 0739   CHOLHDL 2.5 03/23/2015 0739   VLDL 13 03/23/2015 0739   LDLCALC 70 03/23/2015 0739   HgbA1c:  Lab Results  Component Value Date   HGBA1C 6.4 03/20/2015   Urine Drug Screen: No results found for: LABOPIA, COCAINSCRNUR, LABBENZ, AMPHETMU, THCU, LABBARB    IMAGING  Ct Head Wo Contrast 03/22/2015   Suspect recent infarct in the medial left temporal lobe just superior and lateral to the left quadrigeminal plate cistern. This finding is best seen on axial slice 12 series 123456. Elsewhere, there is age related volume loss with patchy periventricular small vessel disease. No hemorrhage. Comment: An atypical mass with edema in the medial left temporal lobe could present in this manner.   MRI HEAD  03/23/2015   1. No acute intracranial infarct or other abnormality identified. 2. No mass lesion or abnormal enhancement within the brain. Previously questioned abnormality within the mesial left temporal lobe likely related to volume-averaging and patient  positioning on prior CT. 3. Age appropriate cerebral atrophy with mild chronic small vessel ischemic disease.   MRA HEAD  03/23/2015   Normal MRA of the intracranial circulation. No large or proximal arterial branch occlusion. Relatively little if any intracranial atherosclerotic disease for patient age.   Carotid Doppler   pending   2D Echocardiogram  - Mild LVH with LVEF 60-65% and grossly normal diastolic function. Upper normal left atrial chamber size. Mildly calcified mitral leaflets with mild mitral regurgitation. Sclerotic aortic valve without stenosis. Mildly dilated right ventricle. Trivial tricuspid regurgitation with PASP 33 mmHg. No obvious  PFO or ASD.    PHYSICAL EXAM Awake , Alert, oriented x 3. Fund of knowledge intact. No aphasia with norrmal speech output. eye movements full without nystagmus. fundi were not visualized. Vision acuity and fields appear normal. Hearing is normal. Palatal movements are normal. Face symmetric. Tongue midline. Normal strength (no arm drift, grip strong, no orbiting, FMM intact, no LE drift), tone, and coordination. Normal sensation. Gait deferred.   ASSESSMENT/PLAN Angelica Benson is a 81 y.o. female with history of hypertension and diabetes presenting with transient LUE weakness. She did not receive IV t-PA due to resolved symptoms.   R brain TIA vs cervical neck radiculopathy  Resultant  Neuro symptoms resolved  MRI  No acute stroke  MRA  Unremarkable   Carotid Doppler  pending   2D Echo  EF 60-65%   LDL 70  HgbA1c 6.4  Lovenox 40 mg sq daily for VTE prophylaxis Diet heart healthy/carb modified Room service appropriate?: Yes; Fluid consistency:: Thin  aspirin 81 mg daily prior to admission, now on aspirin 325 mg daily.   Patient counseled to be compliant with her antithrombotic medications  Ongoing aggressive stroke risk factor management  Consider OP EMG  Therapy recommendations:  Pending. No anticipated needs  Disposition:  Return home (lives at [facility] independent living facility)  Hypertension  elevated 161/64 this am  Home meds: HCTZ  Hyperlipidemia  Home meds:  No statin  LDL 70, at goal  No statin needed this time  Diabetes type II  HgbA1c 6.4, at goal < 7.0  Controlled  Other Stroke Risk Factors  Advanced age  Morbid Obesity, Body mass index is 44.14 kg/(m^2).   Chronic HA  Other Active Problems  asthma  Hospital day #   [facility] See [place] for Pager information 03/23/2015 10:09 AM   I, the attending vascular neurologist, have personally obtained a history, examined the patient, evaluated  laboratory data, individually viewed imaging studies and agree with radiology interpretations. Together with the NP/PA, we formulated the assessment and plan of care which reflects our mutual decision.  I have made any additions or clarifications directly to the above note and agree with the findings and plan as currently documented.   81 yo F with Hx of HTN, DM on ASA 81 admitted for transient LUE weakness and shooting pain from left hand to left face, short lasting. So far stroke work up negative but CUS pending. Etiology TIA vs. Cervical radiculopathy. Recommend outpt EMG/NCS. Increase ASA to 325mg .   Rosalin Hawking, MD PhD Stroke Neurology 03/23/2015 4:58 PM       To contact Stroke Continuity provider, please refer to [email or web address] After hours, contact General Neurology

## 2015-03-23 NOTE — Progress Notes (Signed)
  Echocardiogram 2D Echocardiogram has been performed.  Jenea Dake 03/23/2015, 11:16 AM

## 2015-03-24 ENCOUNTER — Observation Stay (HOSPITAL_BASED_OUTPATIENT_CLINIC_OR_DEPARTMENT_OTHER): Payer: Medicare Other

## 2015-03-24 DIAGNOSIS — G451 Carotid artery syndrome (hemispheric): Secondary | ICD-10-CM

## 2015-03-24 DIAGNOSIS — G459 Transient cerebral ischemic attack, unspecified: Secondary | ICD-10-CM | POA: Diagnosis not present

## 2015-03-24 DIAGNOSIS — G458 Other transient cerebral ischemic attacks and related syndromes: Secondary | ICD-10-CM | POA: Diagnosis not present

## 2015-03-24 DIAGNOSIS — E1159 Type 2 diabetes mellitus with other circulatory complications: Secondary | ICD-10-CM | POA: Diagnosis not present

## 2015-03-24 DIAGNOSIS — M5010 Cervical disc disorder with radiculopathy, unspecified cervical region: Secondary | ICD-10-CM | POA: Insufficient documentation

## 2015-03-24 DIAGNOSIS — M6289 Other specified disorders of muscle: Secondary | ICD-10-CM | POA: Diagnosis not present

## 2015-03-24 DIAGNOSIS — D649 Anemia, unspecified: Secondary | ICD-10-CM | POA: Diagnosis not present

## 2015-03-24 DIAGNOSIS — I10 Essential (primary) hypertension: Secondary | ICD-10-CM | POA: Diagnosis not present

## 2015-03-24 LAB — GLUCOSE, CAPILLARY
GLUCOSE-CAPILLARY: 106 mg/dL — AB (ref 65–99)
GLUCOSE-CAPILLARY: 126 mg/dL — AB (ref 65–99)
Glucose-Capillary: 91 mg/dL (ref 65–99)
Glucose-Capillary: 79 mg/dL (ref 65–99)

## 2015-03-24 LAB — HEMOGLOBIN A1C
Hgb A1c MFr Bld: 6.5 % — ABNORMAL HIGH (ref 4.8–5.6)
MEAN PLASMA GLUCOSE: 140 mg/dL

## 2015-03-24 NOTE — Evaluation (Signed)
Physical Therapy Evaluation Patient Details Name: Angelica Benson MRN: [id number] DOB: Apr 09, 1929 Today's Date: 03/24/2015   History of Present Illness  81 yo female with onset of L side weakness without findings for a new stroke, TIA diagnosed, with HTN, DM PMHx, has caregiver 6x per week.    Clinical Impression  Pt has been evaluated for mobility after having a difficult time with new L side weakness and need for assistance with RW.  Pt has been able to manage with less assistance of caregiver but now is going to need help with every time standing due to balance and gait changes.  Her sitter is available to verify her change in function and did discuss with case manager to get more assistance.  OBS status is hindering ability to get SNF care which would be the optimal resolution to the situation.    Follow Up Recommendations Home health PT;Supervision/Assistance - 24 hour    Equipment Recommendations       Recommendations for Other Services       Precautions / Restrictions Precautions Precautions: Fall (telemetry) Restrictions Weight Bearing Restrictions: No      Mobility  Bed Mobility               General bed mobility comments: up when PT arrived  Transfers Overall transfer level: Needs assistance Equipment used: Rolling walker (2 wheeled);1 person hand held assist Transfers: Sit to/from [facility] Sit to Stand: Min guard;Min assist Stand pivot transfers: Min guard;Min assist       General transfer comment: reminders for placement of hands and safety  Ambulation/Gait Ambulation/Gait assistance: Min guard;Min assist Ambulation Distance (Feet): 100 Feet Assistive device: Rolling walker (2 wheeled);1 person hand held assist Gait Pattern/deviations: Decreased step length - right;Decreased dorsiflexion - left;Shuffle;Wide base of support;Trunk flexed Gait velocity: redcued Gait velocity interpretation: Below normal speed for age/gender General  Gait Details: working to clear the L foot on the floor, with poor control from hip and knee  Stairs            Wheelchair Mobility    Modified Rankin (Stroke Patients Only)       Balance Overall balance assessment: Needs assistance Sitting-balance support: Feet supported Sitting balance-Leahy Scale: Fair   Postural control: Posterior lean Standing balance support: Bilateral upper extremity supported Standing balance-Leahy Scale: Poor Standing balance comment: needs steadying as she tends to struggle to L side as well as using LUE to control walker                             Pertinent Vitals/Pain Pain Assessment: No/denies pain    Home Living Family/patient expects to be discharged to:: Other (Comment) ([place]) Living Arrangements: Alone               Additional Comments: caregiver 6x per week    Prior Function Level of Independence: Needs assistance   Gait / Transfers Assistance Needed: mod I on RW  ADL's / Homemaking Assistance Needed: help from caregiver to do housework and cooking        Hand Dominance        Extremity/Trunk Assessment   Upper Extremity Assessment: Overall WFL for tasks assessed           Lower Extremity Assessment: Generalized weakness      Cervical / Trunk Assessment: Kyphotic  Communication   Communication: No difficulties  Cognition Arousal/Alertness: Awake/alert Behavior During Therapy: WFL for tasks assessed/performed Overall Cognitive  Status: Within Functional Limits for tasks assessed                      General Comments General comments (skin integrity, edema, etc.): Pt was seen for assessment of mobility with caregiver present.  Using RW and needing prompts to control safe transitions and cannot set up to sit safely    Exercises        Assessment/Plan    PT Assessment Patient needs continued PT services  PT Diagnosis Generalized weakness   PT Problem List Decreased range of  motion;Decreased strength;Decreased activity tolerance;Decreased balance;Decreased mobility;Decreased cognition;Decreased coordination;Decreased knowledge of use of DME;Decreased safety awareness;Decreased knowledge of precautions;Cardiopulmonary status limiting activity  PT Treatment Interventions DME instruction;Gait training;Functional mobility training;Therapeutic activities;Therapeutic exercise;Balance training;Neuromuscular re-education;Patient/family education   PT Goals (Current goals can be found in the Care Plan section) Acute Rehab PT Goals Patient Stated Goal: to get walking better and safer at home PT Goal Formulation: With patient Time For Goal Achievement: 04/07/15 Potential to Achieve Goals: Good    Frequency Min 2X/week   Barriers to discharge Decreased caregiver support (Has limited caregiver help) caregiver not there more than a few hours    Co-evaluation               End of Session Equipment Utilized During Treatment: Gait belt Activity Tolerance: Patient tolerated treatment well;Patient limited by fatigue;Other (comment) (pulse 49 after gait) Patient left: in chair;with call bell/phone within reach;with chair alarm set;with family/visitor present Nurse Communication: Mobility status    Functional Assessment Tool Used: clinical judgment Functional Limitation: Mobility: Walking and moving around Mobility: Walking and Moving Around Current Status [id number]): At least 20 percent but less than 40 percent impaired, limited or restricted Mobility: Walking and Moving Around Goal Status [phone]): At least 20 percent but less than 40 percent impaired, limited or restricted    Time: [id number] PT Time Calculation (min) (ACUTE ONLY): 28 min   Charges:   PT Evaluation $Initial PT Evaluation Tier I: 1 Procedure PT Treatments $Gait Training: 8-22 mins   PT G Codes:   PT G-Codes **NOT FOR INPATIENT CLASS** Functional Assessment Tool Used: clinical judgment Functional  Limitation: Mobility: Walking and moving around Mobility: Walking and Moving Around Current Status [id number]): At least 20 percent but less than 40 percent impaired, limited or restricted Mobility: Walking and Moving Around Goal Status [phone]): At least 20 percent but less than 40 percent impaired, limited or restricted    Ramond Dial 03/24/2015, 12:53 PM   Mee Hives, PT MS Acute Rehab Dept. Number: [facility] [id number] and [place] [phone]

## 2015-03-24 NOTE — Care Management Note (Signed)
Case Management Note  Patient Details  Name: Angelica Benson MRN: [id number] Date of Birth: 05/01/1928  Subjective/Objective:                  Date-03-23-15 Initial Assessment Spoke with patient at the bedside along with daughter Jackelyn Poling.  Introduced self as Tourist information centre manager and explained role in discharge planning and how to be reached.  Verified patient lives at [facility] in [place] alone.  Verified patient anticipates to go home with alone, at time of discharge and will have part-time supervision by [facility] through [place] that comes 6 days a week at this time to best of their knowledge. .  Patient denied needing help with their medication.  Patient is driven by family to MD appointments.  Verified patient has PCP. Patient states they currently receive [place].  Patient was provided choice and selected [facility] for home health needs if they arise. May need PT.    Action/Plan:  Referral placed to [facility], will await MD orders Expected Discharge Date:                  Expected Discharge Plan:  [place]  In-House Referral:  Clinical Social Work  Discharge planning Services  CM Consult  Post Acute Care Choice:    Choice offered to:     DME Arranged:    DME Agency:     HH Arranged:  PT [place]:  [place]  Status of Service:  Completed, signed off  Medicare Important Message Given:    Date Medicare IM Given:    Medicare IM give by:    Date Additional Medicare IM Given:    Additional Medicare Important Message give by:     If discussed at [place] of Stay Meetings, dates discussed:    Additional Comments:  Carles Collet, RN 03/24/2015, 12:58 PM

## 2015-03-24 NOTE — Progress Notes (Signed)
VASCULAR LAB PRELIMINARY  PRELIMINARY  PRELIMINARY  PRELIMINARY  Carotid duplex completed.    Preliminary report:  Bilateral:  1-39% ICA stenosis.  Vertebral artery flow is antegrade.     Harlei Lehrmann, [place], RVS 03/24/2015, 12:31 PM

## 2015-03-24 NOTE — Progress Notes (Signed)
STROKE TEAM PROGRESS NOTE   HISTORY Angelica Benson is an 81 y.o. female hx of HTN, DM presenting with transient LUE weakness. She notes around 1700 03/22/2015 ([facility]) she developed difficulty using her left hand. At that time also notes pain shooting from left hand all the way up to her head Symptoms lasted around 5-10 minutes. Denies any numbness, speech or visual deficits. Currently asymptomatic. No prior TIA or CVA. Head CT imaging reviewed, overall unremarkable though question of subacute infarct in the left medial temporal lobe vs possible mass lesion. Modified Rankin: Rankin Score=0. Patient was not administered TPA secondary to symptoms resolved. She was admitted for further evaluation and treatment.   SUBJECTIVE (INTERVAL HISTORY) No family members present. The patient voices no new complaints.    OBJECTIVE Temp:  [97.7 F (36.5 C)-98.3 F (36.8 C)] 97.8 F (36.6 C) (12/09 0430) Pulse Rate:  [65-94] 66 (12/09 1328) Cardiac Rhythm:  [-] Normal sinus rhythm;Bundle branch block;Heart block (12/09 0715) Resp:  [18-23] 23 (12/09 1328) BP: (129-169)/(35-88) 149/50 mmHg (12/09 1328) SpO2:  [96 %-100 %] 98 % (12/09 1328)  CBC:   Recent Labs Lab 03/22/15 2003 03/22/15 2010 03/23/15 0143  WBC 6.1  --  6.4  NEUTROABS 2.8  --   --   HGB 11.3* 13.3 10.5*  HCT 34.6* 39.0 32.9*  MCV 90.1  --  90.1  PLT 217  --  [id number]    Basic Metabolic Panel:   Recent Labs Lab 03/20/15 0921 03/22/15 2003 03/22/15 2010 03/23/15 0143  NA 137 139 138  --   K 3.9 4.4 4.2  --   CL 101 104 104  --   CO2 29 25  --   --   GLUCOSE 105* 112* 105*  --   BUN 18 18 24*  --   CREATININE 0.86 1.04* 0.90 0.97  CALCIUM 9.7 9.7  --   --     Lipid Panel:     Component Value Date/Time   CHOL 139 03/23/2015 0739   TRIG 67 03/23/2015 0739   HDL 56 03/23/2015 0739   CHOLHDL 2.5 03/23/2015 0739   VLDL 13 03/23/2015 0739   LDLCALC 70 03/23/2015 0739   HgbA1c:  Lab Results  Component Value Date   HGBA1C  6.5* 03/23/2015   Urine Drug Screen: No results found for: LABOPIA, COCAINSCRNUR, LABBENZ, AMPHETMU, THCU, LABBARB    IMAGING I have personally reviewed the radiological images below and agree with the radiology interpretations.  Ct Head Wo Contrast 03/22/2015   Suspect recent infarct in the medial left temporal lobe just superior and lateral to the left quadrigeminal plate cistern. This finding is best seen on axial slice 12 series 123456. Elsewhere, there is age related volume loss with patchy periventricular small vessel disease. No hemorrhage. Comment: An atypical mass with edema in the medial left temporal lobe could present in this manner.   MRI HEAD  03/23/2015   1. No acute intracranial infarct or other abnormality identified. 2. No mass lesion or abnormal enhancement within the brain. Previously questioned abnormality within the mesial left temporal lobe likely related to volume-averaging and patient positioning on prior CT. 3. Age appropriate cerebral atrophy with mild chronic small vessel ischemic disease.   MRA HEAD  03/23/2015   Normal MRA of the intracranial circulation. No large or proximal arterial branch occlusion. Relatively little if any intracranial atherosclerotic disease for patient age.    Carotid Doppler - Bilateral: 1-39% ICA stenosis. Vertebral artery flow is antegrade.  2D Echocardiogram  - Mild LVH with LVEF 60-65% and grossly normal diastolic function. Upper normal left atrial chamber size. Mildly calcified mitral leaflets with mild mitral regurgitation. Sclerotic aortic valve without stenosis. Mildly dilated right ventricle. Trivial tricuspid regurgitation with PASP 33 mmHg. No obvious PFO or ASD.    PHYSICAL EXAM Awake , Alert, oriented x 3. Fund of knowledge intact. No aphasia with norrmal speech output. eye movements full without nystagmus. fundi were not visualized. Vision acuity and fields appear normal. Hearing is normal. Palatal movements  are normal. Face symmetric. Tongue midline. Normal strength (no arm drift, grip strong, no orbiting, FMM intact, no LE drift), tone, and coordination. Normal sensation. Gait deferred.   ASSESSMENT/PLAN Angelica Benson is a 81 y.o. female with history of hypertension and diabetes presenting with transient LUE weakness. She did not receive IV t-PA due to resolved symptoms.   R brain TIA vs cervical neck radiculopathy  Resultant  Neuro symptoms resolved  MRI  No acute stroke  MRA  Unremarkable   Carotid Doppler - unremarkable.   2D Echo  EF 60-65%   LDL 70  HgbA1c 6.4  Lovenox 40 mg sq daily for VTE prophylaxis Diet heart healthy/carb modified Room service appropriate?: Yes; Fluid consistency:: Thin  aspirin 81 mg daily prior to admission, now on aspirin 325 mg daily. Continue ASA 325mg  on discharge.  Patient counseled to be compliant with her antithrombotic medications  Ongoing aggressive stroke risk factor management  Consider outpt EMG  Therapy recommendations:  Pending. No anticipated needs  Disposition:  Return home (lives at [facility] independent living facility)  Hypertension  elevated 161/64 this am  Home meds: HCTZ  Hyperlipidemia  Home meds:  No statin  LDL 70, at goal  No statin needed this time  Diabetes type II  HgbA1c 6.4, at goal < 7.0  Controlled  Other Stroke Risk Factors  Advanced age  Morbid Obesity, Body mass index is 44.14 kg/(m^2).   Chronic HA  Other Active Problems  Asthma  Anemia  Hospital day #   Neurology will sign off. Please call with questions. Pt will follow up with NP Cecille Rubin at [facility] in about 1 month and consider outpt EMG. Thanks for the consult.  Rosalin Hawking, MD PhD Stroke Neurology 03/24/2015 3:23 PM     To contact Stroke Continuity provider, please refer to [email or web address] After hours, contact General Neurology

## 2015-03-24 NOTE — Progress Notes (Signed)
[facility] PROGRESS NOTE    Progress Note   Angelica Benson [id number] DOB: 1928/08/27 DOA: 03/22/2015 PCP: Angelica Fendt, MD   Brief Narrative:   Angelica Benson is an 81 y.o. female PMH of well-controlled diabetes, chronic diastolic CHF, and asthma, who presents to the ED from [facility]  independent living facility with complaint of left arm weakness since approximately 5:30 PM on 03/22/2015.   Assessment/Plan:   TIA (transient ischemic attack): HgbA1c at goal, fasting lipid panel HDL > 40, LDL 70 MRI, MRA of the brain without contrast showed no acute strokes PT, OT, Speech consult pending Echocardiogram and Carotid dopplers pending Prophylactic therapy-Antiplatelet med: Aspirin - dose 325 mg PO daily  Cardiac Monitoring with no events  HYPERTENSION, BENIGN: Has been elevated and we'll continue to monitor, allow permissive hypertension.  Type 2 diabetes mellitus ([place]): - Hemoglobin A1c 6.4    DVT Prophylaxis - Lovenox ordered.  Family Communication: none Disposition Plan: Home in am Code Status:     Code Status Orders        Start     Ordered   03/23/15 0048  Do not attempt resuscitation (DNR)   Continuous    Question Answer Comment  In the event of cardiac or respiratory ARREST Do not call a "code blue"   In the event of cardiac or respiratory ARREST Do not perform Intubation, CPR, defibrillation or ACLS   In the event of cardiac or respiratory ARREST Use medication by any route, position, wound care, and other measures to relive pain and suffering. May use oxygen, suction and manual treatment of airway obstruction as needed for comfort.      03/23/15 0053    Advance Directive Documentation        Most Recent Value   Type of Advance Directive  Living will   Pre-existing out of facility DNR order (yellow form or pink MOST form)     "MOST" Form in Place?          IV Access:    Peripheral IV   Procedures and diagnostic studies:   Dg  Chest 2 View  03/23/2015  CLINICAL DATA:  Weakness EXAM: CHEST  2 VIEW COMPARISON:  November 30, 2013 FINDINGS: There is no edema or consolidation. The heart size and pulmonary vascularity are normal. No adenopathy. There is degenerative change in the thoracic spine with diffuse idiopathic skeletal hyperostosis. IMPRESSION: No edema or consolidation. Electronically Signed   By: Lowella Grip III M.D.   On: 03/23/2015 08:57   Ct Head Wo Contrast  03/22/2015  CLINICAL DATA:  Left upper extremity numbness and weakness EXAM: CT HEAD WITHOUT CONTRAST TECHNIQUE: Contiguous axial images were obtained from the base of the skull through the vertex without intravenous contrast. COMPARISON:  February 07, 2013 FINDINGS: There is age related volume loss. There is no intracranial hemorrhage, extra-axial fluid collection, or midline shift. Patchy small vessel disease in the centra semiovale bilaterally is stable. A well-defined mass is not appreciable. There is a focal area of decreased attenuation in the medial left temporal lobe slightly superior and lateral to the quadrigeminal plate cistern on the left, concerning for recent infarct. No other evidence suggesting recent infarct. The bony calvarium appears intact. The visualized mastoid air cells are clear. No intraorbital lesions are identified. IMPRESSION: Suspect recent infarct in the medial left temporal lobe just superior and lateral to the left quadrigeminal plate cistern. This finding is best seen on axial slice 12 series 123456. Elsewhere, there is age  related volume loss with patchy periventricular small vessel disease. No hemorrhage. Comment: An atypical mass with edema in the medial left temporal lobe could present in this manner. Given this finding, it would be prudent to correlate with pre and post-contrast brain MRI. Electronically Signed   By: Lowella Grip III M.D.   On: 03/22/2015 21:27   Mr Angiogram Head Wo Contrast  03/23/2015  CLINICAL DATA:   Initial evaluation for transient left hand weakness. EXAM: MRI HEAD WITHOUT AND WITH CONTRAST MRA HEAD WITHOUT CONTRAST TECHNIQUE: Multiplanar, multiecho pulse sequences of the brain and surrounding structures were obtained without and with intravenous contrast. Angiographic images of the head were obtained using MRA technique without contrast. CONTRAST:  20 cc of MultiHance. COMPARISON:  Prior head CT from 03/22/2015. FINDINGS: MRI HEAD FINDINGS No abnormal foci of restricted diffusion to suggest acute intracranial infarct. Gray-white matter differentiation maintained. Normal intravascular flow voids preserved. No acute or chronic intracranial hemorrhage. No areas of chronic infarction. Mild diffuse prominence of the CSF containing spaces is compatible with generalized cerebral atrophy, normal for patient age. Patchy T2/FLAIR hyperintensity within the periventricular and deep white matter is most consistent with chronic small vessel ischemic disease, mild for age. Minimal small vessel changes present within the central pons. No mass lesion, midline shift, or mass effect. No hydrocephalus. No abnormal enhancement on post-contrast sequences. Craniocervical junction within normal limits. Mild degenerative spondylolysis without significant stenosis noted within the visualized upper cervical spine. Incidental note made of a partially empty sella. No acute abnormality about the orbits. Scattered mucosal thickening within the ethmoidal air cells and maxillary sinuses. Small retention cyst within the right maxillary sinus. No mastoid effusion. Inner ear structures within normal limits. Bone marrow signal intensity within normal limits. No scalp soft tissue abnormality. MRA HEAD FINDINGS ANTERIOR CIRCULATION: Visualized distal cervical segments of the internal carotid arteries are widely patent with antegrade flow. Petrous, cavernous, and supraclinoid segments are widely patent. Origins of the ophthalmic arteries are  normal. A1 segments, anterior communicating artery, and anterior cerebral arteries well opacified. M1 segments widely patent without stenosis or occlusion. MCA bifurcations normal. Distal MCA branches well opacified and symmetric. POSTERIOR CIRCULATION: Vertebral arteries are patent to the vertebrobasilar junction. Right vertebral artery slightly dominant. Posterior inferior cerebral arteries patent bilaterally. Basilar artery well opacified. Superior cerebellar arteries widely patent. Posterior cerebral arteries well opacified to their distal aspects. Prominent left posterior communicating artery noted. No aneurysm or vascular malformation. IMPRESSION: MRI HEAD IMPRESSION: 1. No acute intracranial infarct or other abnormality identified. 2. No mass lesion or abnormal enhancement within the brain. Previously questioned abnormality within the mesial left temporal lobe likely related to volume-averaging and patient positioning on prior CT. 3. Age appropriate cerebral atrophy with mild chronic small vessel ischemic disease. MRA HEAD IMPRESSION: Normal MRA of the intracranial circulation. No large or proximal arterial branch occlusion. Relatively little if any intracranial atherosclerotic disease for patient age. Electronically Signed   By: Jeannine Boga M.D.   On: 03/23/2015 02:33   Mr Jeri Cos [id number] Contrast  03/23/2015  CLINICAL DATA:  Initial evaluation for transient left hand weakness. EXAM: MRI HEAD WITHOUT AND WITH CONTRAST MRA HEAD WITHOUT CONTRAST TECHNIQUE: Multiplanar, multiecho pulse sequences of the brain and surrounding structures were obtained without and with intravenous contrast. Angiographic images of the head were obtained using MRA technique without contrast. CONTRAST:  20 cc of MultiHance. COMPARISON:  Prior head CT from 03/22/2015. FINDINGS: MRI HEAD FINDINGS No abnormal foci of restricted diffusion to suggest  acute intracranial infarct. Gray-white matter differentiation maintained. Normal  intravascular flow voids preserved. No acute or chronic intracranial hemorrhage. No areas of chronic infarction. Mild diffuse prominence of the CSF containing spaces is compatible with generalized cerebral atrophy, normal for patient age. Patchy T2/FLAIR hyperintensity within the periventricular and deep white matter is most consistent with chronic small vessel ischemic disease, mild for age. Minimal small vessel changes present within the central pons. No mass lesion, midline shift, or mass effect. No hydrocephalus. No abnormal enhancement on post-contrast sequences. Craniocervical junction within normal limits. Mild degenerative spondylolysis without significant stenosis noted within the visualized upper cervical spine. Incidental note made of a partially empty sella. No acute abnormality about the orbits. Scattered mucosal thickening within the ethmoidal air cells and maxillary sinuses. Small retention cyst within the right maxillary sinus. No mastoid effusion. Inner ear structures within normal limits. Bone marrow signal intensity within normal limits. No scalp soft tissue abnormality. MRA HEAD FINDINGS ANTERIOR CIRCULATION: Visualized distal cervical segments of the internal carotid arteries are widely patent with antegrade flow. Petrous, cavernous, and supraclinoid segments are widely patent. Origins of the ophthalmic arteries are normal. A1 segments, anterior communicating artery, and anterior cerebral arteries well opacified. M1 segments widely patent without stenosis or occlusion. MCA bifurcations normal. Distal MCA branches well opacified and symmetric. POSTERIOR CIRCULATION: Vertebral arteries are patent to the vertebrobasilar junction. Right vertebral artery slightly dominant. Posterior inferior cerebral arteries patent bilaterally. Basilar artery well opacified. Superior cerebellar arteries widely patent. Posterior cerebral arteries well opacified to their distal aspects. Prominent left posterior  communicating artery noted. No aneurysm or vascular malformation. IMPRESSION: MRI HEAD IMPRESSION: 1. No acute intracranial infarct or other abnormality identified. 2. No mass lesion or abnormal enhancement within the brain. Previously questioned abnormality within the mesial left temporal lobe likely related to volume-averaging and patient positioning on prior CT. 3. Age appropriate cerebral atrophy with mild chronic small vessel ischemic disease. MRA HEAD IMPRESSION: Normal MRA of the intracranial circulation. No large or proximal arterial branch occlusion. Relatively little if any intracranial atherosclerotic disease for patient age. Electronically Signed   By: Jeannine Boga M.D.   On: 03/23/2015 02:33     Medical Consultants:    None.  Anti-Infectives:   Anti-infectives    None      Subjective:    Caylan R Littler no complains  Objective:    Filed Vitals:   03/23/15 1524 03/23/15 1900 03/23/15 2105 03/24/15 0430  BP: 156/55  138/35 129/49  Pulse: 65  69 67  Temp:   98.3 F (36.8 C) 97.8 F (36.6 C)  TempSrc:   Oral Oral  Resp:   18 18  Height:      Weight:      SpO2: 100% 96% 98% 100%    Intake/Output Summary (Last 24 hours) at 03/24/15 1214 Last data filed at 03/24/15 1000  Gross per 24 hour  Intake    580 ml  Output    850 ml  Net   -270 ml   Filed Weights   03/22/15 1856  Weight: 102.513 kg (226 lb)    Exam: Gen:  NAD Cardiovascular:  RRR, No M/R/G Chest and lungs:   CTAB Abdomen:  Abdomen soft, NT/ND, + BS Extremities:  No C/E/C   Data Reviewed:    Labs: Basic Metabolic Panel:  Recent Labs Lab 03/20/15 0921 03/22/15 2003 03/22/15 2010 03/23/15 0143  NA 137 139 138  --   K 3.9 4.4 4.2  --   CL  101 104 104  --   CO2 29 25  --   --   GLUCOSE 105* 112* 105*  --   BUN 18 18 24*  --   CREATININE 0.86 1.04* 0.90 0.97  CALCIUM 9.7 9.7  --   --    GFR Estimated Creatinine Clearance: 46.6 mL/min (by C-G formula based on Cr of  0.97). Liver Function Tests:  Recent Labs Lab 03/22/15 2003  AST 24  ALT 11*  ALKPHOS 62  BILITOT 0.5  PROT 7.0  ALBUMIN 3.8   No results for input(s): LIPASE, AMYLASE in the last 168 hours. No results for input(s): AMMONIA in the last 168 hours. Coagulation profile  Recent Labs Lab 03/22/15 2219 03/23/15 0143  INR 1.02 1.03    CBC:  Recent Labs Lab 03/22/15 2003 03/22/15 2010 03/23/15 0143  WBC 6.1  --  6.4  NEUTROABS 2.8  --   --   HGB 11.3* 13.3 10.5*  HCT 34.6* 39.0 32.9*  MCV 90.1  --  90.1  PLT 217  --  198   Cardiac Enzymes: No results for input(s): CKTOTAL, CKMB, CKMBINDEX, TROPONINI in the last 168 hours. BNP (last 3 results) No results for input(s): PROBNP in the last 8760 hours. CBG:  Recent Labs Lab 03/23/15 0944 03/23/15 1258 03/23/15 1657 03/23/15 2112 03/24/15 0821  GLUCAP 74 82 143* 75 106*   D-Dimer: No results for input(s): DDIMER in the last 72 hours. Hgb A1c:  Recent Labs  03/23/15 0739  HGBA1C 6.5*   Lipid Profile:  Recent Labs  03/23/15 0739  CHOL 139  HDL 56  LDLCALC 70  TRIG 67  CHOLHDL 2.5   Thyroid function studies: No results for input(s): TSH, T4TOTAL, T3FREE, THYROIDAB in the last 72 hours.  Invalid input(s): FREET3 Anemia work up: No results for input(s): VITAMINB12, FOLATE, FERRITIN, TIBC, IRON, RETICCTPCT in the last 72 hours. Sepsis Labs:  Recent Labs Lab 03/22/15 2003 03/23/15 0143  WBC 6.1 6.4   Microbiology No results found for this or any previous visit (from the past 240 hour(s)).   Medications:   . aspirin  300 mg Rectal Daily   Or  . aspirin  325 mg Oral Daily  . enoxaparin (LOVENOX) injection  40 mg Subcutaneous Q24H  . fluticasone  2 spray Each Nare Daily  . gabapentin  100 mg Oral BID  . hydrochlorothiazide  12.5 mg Oral Daily  . insulin aspart  0-15 Units Subcutaneous TID WC  . mometasone-formoterol  2 puff Inhalation BID  . traZODone  50 mg Oral QHS   Continuous  Infusions:   Time spent: 15 min     Charlynne Cousins  [facility] Pager [phone]  *Please refer to [email or web address], password [email or web address] to get updated schedule on who will round on this patient, as hospitalists switch teams weekly. If 7PM-7AM, please contact night-coverage at [email or web address], password [email or web address] for any overnight needs.  03/24/2015, 12:14 PM

## 2015-03-25 DIAGNOSIS — M6289 Other specified disorders of muscle: Secondary | ICD-10-CM | POA: Diagnosis not present

## 2015-03-25 DIAGNOSIS — G458 Other transient cerebral ischemic attacks and related syndromes: Secondary | ICD-10-CM | POA: Diagnosis not present

## 2015-03-25 DIAGNOSIS — E1100 Type 2 diabetes mellitus with hyperosmolarity without nonketotic hyperglycemic-hyperosmolar coma (NKHHC): Secondary | ICD-10-CM

## 2015-03-25 DIAGNOSIS — M5010 Cervical disc disorder with radiculopathy, unspecified cervical region: Secondary | ICD-10-CM

## 2015-03-25 DIAGNOSIS — G459 Transient cerebral ischemic attack, unspecified: Secondary | ICD-10-CM | POA: Diagnosis not present

## 2015-03-25 DIAGNOSIS — I10 Essential (primary) hypertension: Secondary | ICD-10-CM | POA: Diagnosis not present

## 2015-03-25 LAB — GLUCOSE, CAPILLARY
Glucose-Capillary: 102 mg/dL — ABNORMAL HIGH (ref 65–99)
Glucose-Capillary: 298 mg/dL — ABNORMAL HIGH (ref 65–99)

## 2015-03-25 MED ORDER — ASPIRIN 325 MG PO TABS: 325.0000 mg | ORAL_TABLET | Freq: Every day | ORAL | Status: DC

## 2015-03-25 NOTE — Progress Notes (Signed)
Patient discharge teaching given, including activity, diet, follow-up appoints, and medications. Patient verbalized understanding of all discharge instructions. IV access was d/c'd. Vitals are stable. Skin is intact except as charted in most recent assessments. Pt to be escorted out by NT, to be driven home by family. 

## 2015-03-25 NOTE — Discharge Summary (Signed)
Physician Discharge Summary  Angelica Benson LNL:[id number] DOB: Apr 24, 1928 DOA: 03/22/2015  PCP: Philis Fendt, MD  Admit date: 03/22/2015 Discharge date: 03/25/2015  Time spent: 79mnutes  Recommendations for Outpatient Follow-up:  1. Follow-up with neurology in 2-4 weeks.   Discharge Diagnoses:  Principal Problem:   TIA (transient ischemic attack) Active Problems:   HYPERTENSION, BENIGN   Type 2 diabetes mellitus (HCC)   Left-sided weakness   Normochromic normocytic anemia   Cervical disc disorder with radiculopathy of cervical region   Discharge Condition: stable  Diet recommendation: carb modified  Filed Weights   03/22/15 1856  Weight: 102.513 kg (226 lb)    History of present illness:   81year old with past medical history well controlled diabetes, chronic diastolic heart failure and asthma who presents from her independent living facility for left arm weakness that started on the day of admission  Hospital Course:  TIA (transient ischemic attack): Neurology was consulted. HgbA1c at goal, fasting lipid panel HDL > 40, LDL 70, no need for statin at this point. MRI, MRA of the brain without contrast showed no acute strokes PT physical therapy at home  Echocardiogram EF 60% and Carotid dopplers no ICA stenosis. Prophylactic therapy-Antiplatelet med: Aspirin - dose 325 mg PO daily  Cardiac Monitoring with no events  HYPERTENSION, BENIGN: Will start blood pressure medication and to 3 days as an outpatient. No changes made.  [place] 2 [place] - Hemoglobin A1c 6.4. - Continue current regimen no changes made.  Procedures:  MRI brain  CT head  Carotid doppler  Echo  Consultations:  neuro  Discharge Exam: Filed Vitals:   03/25/15 0418 03/25/15 0944  BP: 119/35 148/34  Pulse: 66 77  Temp: 98.7 F (37.1 C) 97.5 F (36.4 C)  Resp: 18 14    General: A&O x3 Cardiovascular: RRR Respiratory: good air movement CTA B/L  Discharge  Instructions   Discharge Instructions    Ambulatory referral to Neurology    Complete by:  As directed   Pt will follow up with NP CCecille Rubinin about 1 month. Thanks.     Diet - low sodium heart healthy    Complete by:  As directed      Increase activity slowly    Complete by:  As directed           Current Discharge Medication List    START taking these medications   Details  aspirin 325 MG tablet Take 1 tablet (325 mg total) by mouth daily. Qty: 30 tablet, Refills: 0      CONTINUE these medications which have NOT CHANGED   Details  acetaminophen (TYLENOL) 500 MG tablet Take 1,000 mg by mouth 2 (two) times daily.     albuterol (PROVENTIL HFA;VENTOLIN HFA) 108 (90 BASE) MCG/ACT inhaler Inhale 2 puffs into the lungs every 6 (six) hours as needed for wheezing or shortness of breath.    DULERA 200-5 MCG/ACT AERO Inhale 2 puffs into the lungs 2 (two) times daily.     gabapentin (NEURONTIN) 100 MG capsule Take 100 mg by mouth 2 (two) times daily.    Associated Diagnoses: Type II or unspecified type diabetes mellitus without mention of complication, not stated as uncontrolled    hydrochlorothiazide (HYDRODIURIL) 25 MG tablet Take 12.5 mg by mouth daily.     metFORMIN (GLUCOPHAGE) 500 MG tablet Take 500 mg by mouth 2 (two) times daily with a meal.    permethrin (ELIMITE) 5 % cream Apply 1 application topically daily as  needed (for irritation).     potassium chloride SA (K-DUR,KLOR-CON) 20 MEQ tablet Take 20 mEq by mouth daily.    sitaGLIPtin (JANUVIA) 100 MG tablet Take 100 mg by mouth daily.    traZODone (DESYREL) 50 MG tablet Take 50 mg by mouth at bedtime.     triamcinolone cream (KENALOG) 0.5 % Apply 1 application topically daily as needed (for rash).     Vitamin D, Ergocalciferol, (DRISDOL) 50000 UNITS CAPS capsule TAKE 1 CAPSULE BY MOUTH TWICE A WEEK Qty: 8 capsule, Refills: 2    VOLTAREN 1 % GEL Apply 2 g topically 4 (four) times daily as needed (for pain).      Blood Glucose Monitoring Suppl (ONE TOUCH ULTRA 2) W/DEVICE KIT Use to check blood sugar daily dx code 250.00 Qty: 1 each, Refills: 0    fluticasone (FLONASE) 50 MCG/ACT nasal spray Place 2 sprays into the nose daily. Qty: 16 g, Refills: 2    !! Lancets (FREESTYLE) lancets Use as instructed to check blood sugars once a day dx code 250.00 Qty: 100 each, Refills: 1    ONE TOUCH ULTRA TEST test strip USE TO CHECK BLOOD SUGAR DAILY AS DIRECTED Qty: 50 each, Refills: 3    !! ONETOUCH DELICA LANCETS 05X MISC USE AS DIRECTED TO CHECK BLOOD SUGAR EVERY DAY Qty: 100 each, Refills: 3     !! - Potential duplicate medications found. Please discuss with provider.    STOP taking these medications     aspirin 81 MG chewable tablet        Allergies  Allergen Reactions  . Morphine And Related Rash   Follow-up Information    Follow up with [facility].   Why:  For home health PT, will call in next 24 to 48 hours to set up first home visit.   Contact information:   [address] [place] [address] [phone]       Follow up with Dennie Bible, NP In 1 month.   Specialty:  Family Medicine   Why:  stroke clinic   Contact information:   [address] [place] [address] [phone]       Follow up with Nolene Ebbs A, MD In 3 weeks.   Specialty:  Internal Medicine   Why:  hospital follow up   Contact information:   [place] [address] [phone]        The results of significant diagnostics from this hospitalization (including imaging, microbiology, ancillary and laboratory) are listed below for reference.    Significant Diagnostic Studies: Dg Chest 2 View  03/23/2015  CLINICAL DATA:  Weakness EXAM: CHEST  2 VIEW COMPARISON:  November 30, 2013 FINDINGS: There is no edema or consolidation. The heart size and pulmonary vascularity are normal. No adenopathy. There is degenerative change in the thoracic  spine with diffuse idiopathic skeletal hyperostosis. IMPRESSION: No edema or consolidation. Electronically Signed   By: Lowella Grip III M.D.   On: 03/23/2015 08:57   Ct Head Wo Contrast  03/22/2015  CLINICAL DATA:  Left upper extremity numbness and weakness EXAM: CT HEAD WITHOUT CONTRAST TECHNIQUE: Contiguous axial images were obtained from the base of the skull through the vertex without intravenous contrast. COMPARISON:  February 07, 2013 FINDINGS: There is age related volume loss. There is no intracranial hemorrhage, extra-axial fluid collection, or midline shift. Patchy small vessel disease in the centra semiovale bilaterally is stable. A well-defined mass is not appreciable. There is a focal  area of decreased attenuation in the medial left temporal lobe slightly superior and lateral to the quadrigeminal plate cistern on the left, concerning for recent infarct. No other evidence suggesting recent infarct. The bony calvarium appears intact. The visualized mastoid air cells are clear. No intraorbital lesions are identified. IMPRESSION: Suspect recent infarct in the medial left temporal lobe just superior and lateral to the left quadrigeminal plate cistern. This finding is best seen on axial slice 12 series 833. Elsewhere, there is age related volume loss with patchy periventricular small vessel disease. No hemorrhage. Comment: An atypical mass with edema in the medial left temporal lobe could present in this manner. Given this finding, it would be prudent to correlate with pre and post-contrast brain MRI. Electronically Signed   By: Lowella Grip III M.D.   On: 03/22/2015 21:27   Mr Angiogram Head Wo Contrast  03/23/2015  CLINICAL DATA:  Initial evaluation for transient left hand weakness. EXAM: MRI HEAD WITHOUT AND WITH CONTRAST MRA HEAD WITHOUT CONTRAST TECHNIQUE: Multiplanar, multiecho pulse sequences of the brain and surrounding structures were obtained without and with intravenous contrast.  Angiographic images of the head were obtained using MRA technique without contrast. CONTRAST:  20 cc of MultiHance. COMPARISON:  Prior head CT from 03/22/2015. FINDINGS: MRI HEAD FINDINGS No abnormal foci of restricted diffusion to suggest acute intracranial infarct. Gray-white matter differentiation maintained. Normal intravascular flow voids preserved. No acute or chronic intracranial hemorrhage. No areas of chronic infarction. Mild diffuse prominence of the CSF containing spaces is compatible with generalized cerebral atrophy, normal for patient age. Patchy T2/FLAIR hyperintensity within the periventricular and deep white matter is most consistent with chronic small vessel ischemic disease, mild for age. Minimal small vessel changes present within the central pons. No mass lesion, midline shift, or mass effect. No hydrocephalus. No abnormal enhancement on post-contrast sequences. Craniocervical junction within normal limits. Mild degenerative spondylolysis without significant stenosis noted within the visualized upper cervical spine. Incidental note made of a partially empty sella. No acute abnormality about the orbits. Scattered mucosal thickening within the ethmoidal air cells and maxillary sinuses. Small retention cyst within the right maxillary sinus. No mastoid effusion. Inner ear structures within normal limits. Bone marrow signal intensity within normal limits. No scalp soft tissue abnormality. MRA HEAD FINDINGS ANTERIOR CIRCULATION: Visualized distal cervical segments of the internal carotid arteries are widely patent with antegrade flow. Petrous, cavernous, and supraclinoid segments are widely patent. Origins of the ophthalmic arteries are normal. A1 segments, anterior communicating artery, and anterior cerebral arteries well opacified. M1 segments widely patent without stenosis or occlusion. MCA bifurcations normal. Distal MCA branches well opacified and symmetric. POSTERIOR CIRCULATION: Vertebral  arteries are patent to the vertebrobasilar junction. Right vertebral artery slightly dominant. Posterior inferior cerebral arteries patent bilaterally. Basilar artery well opacified. Superior cerebellar arteries widely patent. Posterior cerebral arteries well opacified to their distal aspects. Prominent left posterior communicating artery noted. No aneurysm or vascular malformation. IMPRESSION: MRI HEAD IMPRESSION: 1. No acute intracranial infarct or other abnormality identified. 2. No mass lesion or abnormal enhancement within the brain. Previously questioned abnormality within the mesial left temporal lobe likely related to volume-averaging and patient positioning on prior CT. 3. Age appropriate cerebral atrophy with mild chronic small vessel ischemic disease. MRA HEAD IMPRESSION: Normal MRA of the intracranial circulation. No large or proximal arterial branch occlusion. Relatively little if any intracranial atherosclerotic disease for patient age. Electronically Signed   By: Jeannine Boga M.D.   On: 03/23/2015 02:33  Mr Jeri Cos Wo Contrast  03/23/2015  CLINICAL DATA:  Initial evaluation for transient left hand weakness. EXAM: MRI HEAD WITHOUT AND WITH CONTRAST MRA HEAD WITHOUT CONTRAST TECHNIQUE: Multiplanar, multiecho pulse sequences of the brain and surrounding structures were obtained without and with intravenous contrast. Angiographic images of the head were obtained using MRA technique without contrast. CONTRAST:  20 cc of MultiHance. COMPARISON:  Prior head CT from 03/22/2015. FINDINGS: MRI HEAD FINDINGS No abnormal foci of restricted diffusion to suggest acute intracranial infarct. Gray-white matter differentiation maintained. Normal intravascular flow voids preserved. No acute or chronic intracranial hemorrhage. No areas of chronic infarction. Mild diffuse prominence of the CSF containing spaces is compatible with generalized cerebral atrophy, normal for patient age. Patchy T2/FLAIR  hyperintensity within the periventricular and deep white matter is most consistent with chronic small vessel ischemic disease, mild for age. Minimal small vessel changes present within the central pons. No mass lesion, midline shift, or mass effect. No hydrocephalus. No abnormal enhancement on post-contrast sequences. Craniocervical junction within normal limits. Mild degenerative spondylolysis without significant stenosis noted within the visualized upper cervical spine. Incidental note made of a partially empty sella. No acute abnormality about the orbits. Scattered mucosal thickening within the ethmoidal air cells and maxillary sinuses. Small retention cyst within the right maxillary sinus. No mastoid effusion. Inner ear structures within normal limits. Bone marrow signal intensity within normal limits. No scalp soft tissue abnormality. MRA HEAD FINDINGS ANTERIOR CIRCULATION: Visualized distal cervical segments of the internal carotid arteries are widely patent with antegrade flow. Petrous, cavernous, and supraclinoid segments are widely patent. Origins of the ophthalmic arteries are normal. A1 segments, anterior communicating artery, and anterior cerebral arteries well opacified. M1 segments widely patent without stenosis or occlusion. MCA bifurcations normal. Distal MCA branches well opacified and symmetric. POSTERIOR CIRCULATION: Vertebral arteries are patent to the vertebrobasilar junction. Right vertebral artery slightly dominant. Posterior inferior cerebral arteries patent bilaterally. Basilar artery well opacified. Superior cerebellar arteries widely patent. Posterior cerebral arteries well opacified to their distal aspects. Prominent left posterior communicating artery noted. No aneurysm or vascular malformation. IMPRESSION: MRI HEAD IMPRESSION: 1. No acute intracranial infarct or other abnormality identified. 2. No mass lesion or abnormal enhancement within the brain. Previously questioned abnormality  within the mesial left temporal lobe likely related to volume-averaging and patient positioning on prior CT. 3. Age appropriate cerebral atrophy with mild chronic small vessel ischemic disease. MRA HEAD IMPRESSION: Normal MRA of the intracranial circulation. No large or proximal arterial branch occlusion. Relatively little if any intracranial atherosclerotic disease for patient age. Electronically Signed   By: Jeannine Boga M.D.   On: 03/23/2015 02:33    Microbiology: No results found for this or any previous visit (from the past 240 hour(s)).   Labs: Basic Metabolic Panel:  Recent Labs Lab 03/20/15 0921 03/22/15 2003 03/22/15 2010 03/23/15 0143  NA 137 139 138  --   K 3.9 4.4 4.2  --   CL 101 104 104  --   CO2 29 25  --   --   GLUCOSE 105* 112* 105*  --   BUN 18 18 24*  --   CREATININE 0.86 1.04* 0.90 0.97  CALCIUM 9.7 9.7  --   --    Liver Function Tests:  Recent Labs Lab 03/22/15 2003  AST 24  ALT 11*  ALKPHOS 62  BILITOT 0.5  PROT 7.0  ALBUMIN 3.8   No results for input(s): LIPASE, AMYLASE in the last 168 hours. No results  for input(s): AMMONIA in the last 168 hours. CBC:  Recent Labs Lab 03/22/15 2003 03/22/15 2010 03/23/15 0143  WBC 6.1  --  6.4  NEUTROABS 2.8  --   --   HGB 11.3* 13.3 10.5*  HCT 34.6* 39.0 32.9*  MCV 90.1  --  90.1  PLT 217  --  198   Cardiac Enzymes: No results for input(s): CKTOTAL, CKMB, CKMBINDEX, TROPONINI in the last 168 hours. BNP: BNP (last 3 results) No results for input(s): BNP in the last 8760 hours.  ProBNP (last 3 results) No results for input(s): PROBNP in the last 8760 hours.  CBG:  Recent Labs Lab 03/24/15 0821 03/24/15 1239 03/24/15 1707 03/24/15 2126 03/25/15 0812  GLUCAP 106* 91 79 126* 102*     Signed:  Charlynne Cousins  [facility] 03/25/2015, 10:27 AM

## 2015-03-30 ENCOUNTER — Encounter: Payer: Self-pay | Admitting: Podiatry

## 2015-03-30 ENCOUNTER — Ambulatory Visit (INDEPENDENT_AMBULATORY_CARE_PROVIDER_SITE_OTHER): Payer: Medicare Other | Admitting: Podiatry

## 2015-03-30 DIAGNOSIS — M79676 Pain in unspecified toe(s): Secondary | ICD-10-CM | POA: Diagnosis not present

## 2015-03-30 DIAGNOSIS — B351 Tinea unguium: Secondary | ICD-10-CM

## 2015-03-30 NOTE — Progress Notes (Signed)
Patient ID: Angelica Benson, female   DOB: 08-31-1928, 81 y.o.   MRN: [id number] Complaint:  Visit Type: Patient returns to my office for continued preventative foot care services. Complaint: Patient states" my nails have grown long and thick and become painful to walk and wear shoes" Patient has been diagnosed with DM with neuropathy.. The patient presents for preventative foot care services. No changes to ROS  Podiatric Exam: Vascular: dorsalis pedis and posterior tibial pulses are palpable bilateral. Capillary return is immediate. Temperature gradient is WNL. Skin turgor WNL  Sensorium: Normal Semmes Weinstein monofilament test. Normal tactile sensation bilaterally. Nail Exam: Pt has thick disfigured discolored nails with subungual debris noted bilateral entire nail hallux through fifth toenails Ulcer Exam: There is no evidence of ulcer or pre-ulcerative changes or infection. Orthopedic Exam: Muscle tone and strength are WNL. No limitations in general ROM. No crepitus or effusions noted. Foot type and digits show no abnormalities. Bony prominences are unremarkable. Skin: No Porokeratosis. No infection or ulcers  Diagnosis:  Onychomycosis, , Pain in right toe, pain in left toes  Treatment & Plan Procedures and Treatment: Consent by patient was obtained for treatment procedures. The patient understood the discussion of treatment and procedures well. All questions were answered thoroughly reviewed. Debridement of mycotic and hypertrophic toenails, 1 through 5 bilateral and clearing of subungual debris. No ulceration, no infection noted.  Initiate diabetic shoe paperwork. Return Visit-Office Procedure: Patient instructed to return to the office for a follow up visit 3 months for continued evaluation and treatment.  Gardiner Barefoot DPM

## 2015-04-03 ENCOUNTER — Encounter: Payer: Self-pay | Admitting: Endocrinology

## 2015-04-03 ENCOUNTER — Ambulatory Visit (INDEPENDENT_AMBULATORY_CARE_PROVIDER_SITE_OTHER): Payer: Medicare Other | Admitting: Endocrinology

## 2015-04-03 VITALS — BP 130/64 | HR 78 | Temp 98.5°F | Resp 16 | Ht 62.0 in | Wt 222.6 lb

## 2015-04-03 DIAGNOSIS — E119 Type 2 diabetes mellitus without complications: Secondary | ICD-10-CM | POA: Diagnosis not present

## 2015-04-03 NOTE — Patient Instructions (Signed)
Check blood sugars on waking up 2  times a week  Also check blood sugars about 2 hours after a meal and do this after different meals by rotation  Recommended blood sugar levels on waking up is 90-130 and about 2 hours after meal is 130-160  Please bring your blood sugar monitor to each visit, thank you  Gabapentin as needed only for sharp pain or tingling

## 2015-04-03 NOTE — Progress Notes (Signed)
Patient ID: Angelica Benson, female   DOB: 19-Jan-1929, 81 y.o.   MRN: [id number]   Reason for Appointment : Followup of diabetes and other issues  History of Present Illness          Diagnosis: Type 2 diabetes mellitus, date of diagnosis: 1992       Past history: She is unclear about the date of her diagnosis and treatment history. At onset she had symptoms of frequent urination, weakness and sleepiness. She has been on various oral hypoglycemic drugs since her diagnosis. Has been on metformin since at least 2006 and Januvia since 2007. Also previously had taken glyburide and Avandia Her A1c has generally been in the 5.9-6.4 range previously  Recent history:  She is on Januvia and low dose metformin with fairly consistent control, A1c near 6.5 consistently  She thinks that she is trying to eat less now and has finally started losing weight which she had gained Not able to exercise but is trying to walk when she can around her home Checking blood sugars somewhat infrequently and mostly in the mornings with fairly good readings  Oral hypoglycemic drugs the patient is taking are: Metformin 500 mg twice a day, Januvia 100      Side effects from medications have been: None  Glucose monitoring:  done irregularly and only in the morning usually.   Glucometer: One Touch.       Blood Glucose readings from download: Fasting range 102-137 with median 110   Hypoglycemia:   none  Self-care: The diet that the patient has been following is: tries to limit high-fat foods    Meals: 3 meals per day.  at breakfast has instant oatmeal and toast. At lunch will have sandwich and fruit; dinner is chicken, starch and vegetables; her snacks are fruit, peanut butter or cheese crackers          Exercise:    will walk around her living area Dietician visit: Most recent: 2000            Compliance with the medical regimen: Good  Weight history:  Wt Readings from Last 3 Encounters:  04/03/15 222  lb 9.6 oz (100.971 kg)  03/22/15 226 lb (102.513 kg)  11/21/14 233 lb (105.688 kg)   Glycemic control:   Lab Results  Component Value Date   HGBA1C 6.5* 03/23/2015   HGBA1C 6.4 03/20/2015   HGBA1C 6.5 11/16/2014   Lab Results  Component Value Date   MICROALBUR <0.7 07/18/2014   [place] 70 03/23/2015   CREATININE 0.97 03/23/2015      No visits with results within 1 Week(s) from this visit. Latest known visit with results is:  Admission on 03/22/2015, Discharged on 03/25/2015  Component Date Value Ref Range Status  . WBC 03/22/2015 6.1  4.0 - 10.5 K/uL Final  . RBC 03/22/2015 3.84* 3.87 - 5.11 MIL/uL Final  . Hemoglobin 03/22/2015 11.3* 12.0 - 15.0 g/dL Final  . HCT 03/22/2015 34.6* 36.0 - 46.0 % Final  . MCV 03/22/2015 90.1  78.0 - 100.0 fL Final  . MCH 03/22/2015 29.4  26.0 - 34.0 pg Final  . MCHC 03/22/2015 32.7  30.0 - 36.0 g/dL Final  . RDW 03/22/2015 14.6  11.5 - 15.5 % Final  . Platelets 03/22/2015 217  150 - 400 K/uL Final  . Neutrophils Relative % 03/22/2015 46   Final  . Neutro Abs 03/22/2015 2.8  1.7 - 7.7 K/uL Final  . Lymphocytes Relative 03/22/2015 42  Final  . Lymphs Abs 03/22/2015 2.6  0.7 - 4.0 K/uL Final  . Monocytes Relative 03/22/2015 7   Final  . Monocytes Absolute 03/22/2015 0.4  0.1 - 1.0 K/uL Final  . Eosinophils Relative 03/22/2015 4   Final  . Eosinophils Absolute 03/22/2015 0.3  0.0 - 0.7 K/uL Final  . Basophils Relative 03/22/2015 1   Final  . Basophils Absolute 03/22/2015 0.0  0.0 - 0.1 K/uL Final  . Sodium 03/22/2015 139  135 - 145 mmol/L Final  . Potassium 03/22/2015 4.4  3.5 - 5.1 mmol/L Final  . Chloride 03/22/2015 104  101 - 111 mmol/L Final  . CO2 03/22/2015 25  22 - 32 mmol/L Final  . Glucose, Bld 03/22/2015 112* 65 - 99 mg/dL Final  . BUN 03/22/2015 18  6 - 20 mg/dL Final  . Creatinine, Ser 03/22/2015 1.04* 0.44 - 1.00 mg/dL Final  . Calcium 03/22/2015 9.7  8.9 - 10.3 mg/dL Final  . Total Protein 03/22/2015 7.0  6.5 - 8.1 g/dL  Final  . Albumin 03/22/2015 3.8  3.5 - 5.0 g/dL Final  . AST 03/22/2015 24  15 - 41 U/L Final  . ALT 03/22/2015 11* 14 - 54 U/L Final  . Alkaline Phosphatase 03/22/2015 62  38 - 126 U/L Final  . Total Bilirubin 03/22/2015 0.5  0.3 - 1.2 mg/dL Final  . GFR calc non Af Amer 03/22/2015 48* >60 mL/min Final  . GFR calc Af Amer 03/22/2015 56* >60 mL/min Final   Comment: (NOTE) The eGFR has been calculated using the CKD EPI equation. This calculation has not been validated in all clinical situations. eGFR's persistently <60 mL/min signify possible Chronic Kidney Disease.   . Anion gap 03/22/2015 10  5 - 15 Final  . Troponin i, poc 03/22/2015 0.00  0.00 - 0.08 ng/mL Final  . Comment 3 03/22/2015          Final   Comment: Due to the release kinetics of cTnI, a negative result within the first hours of the onset of symptoms does not rule out myocardial infarction with certainty. If myocardial infarction is still suspected, repeat the test at appropriate intervals.   . Glucose-Capillary 03/22/2015 85  65 - 99 mg/dL Final  . Sodium 03/22/2015 138  135 - 145 mmol/L Final  . Potassium 03/22/2015 4.2  3.5 - 5.1 mmol/L Final  . Chloride 03/22/2015 104  101 - 111 mmol/L Final  . BUN 03/22/2015 24* 6 - 20 mg/dL Final  . Creatinine, Ser 03/22/2015 0.90  0.44 - 1.00 mg/dL Final  . Glucose, Bld 03/22/2015 105* 65 - 99 mg/dL Final  . Calcium, Ion 03/22/2015 1.06* 1.13 - 1.30 mmol/L Final  . TCO2 03/22/2015 26  0 - 100 mmol/L Final  . Hemoglobin 03/22/2015 13.3  12.0 - 15.0 g/dL Final  . HCT 03/22/2015 39.0  36.0 - 46.0 % Final  . aPTT 03/22/2015 20* 24 - 37 seconds Final  . Prothrombin Time 03/22/2015 13.6  11.6 - 15.2 seconds Final  . INR 03/22/2015 1.02  0.00 - 1.49 Final  . Hgb A1c MFr Bld 03/23/2015 6.5* 4.8 - 5.6 % Final   Comment: (NOTE)         Pre-diabetes: 5.7 - 6.4         Diabetes: >6.4         Glycemic control for adults with diabetes: <7.0   . Mean Plasma Glucose 03/23/2015 140    Final   Comment: (NOTE) Performed At: [facility]  [place], [place] [id number] Lindon Romp MD [id number]   . Cholesterol 03/23/2015 139  0 - 200 mg/dL Final  . Triglycerides 03/23/2015 67  <150 mg/dL Final  . HDL 03/23/2015 56  >40 mg/dL Final  . Total CHOL/HDL Ratio 03/23/2015 2.5   Final  . VLDL 03/23/2015 13  0 - 40 mg/dL Final  . LDL Cholesterol 03/23/2015 70  0 - 99 mg/dL Final   Comment:        Total Cholesterol/HDL:CHD Risk Coronary Heart Disease Risk Table                     Men   Women  1/2 Average Risk   3.4   3.3  Average Risk       5.0   4.4  2 X Average Risk   9.6   7.1  3 X Average Risk  23.4   11.0        Use the calculated Patient Ratio above and the CHD Risk Table to determine the patient's CHD Risk.        ATP III CLASSIFICATION (LDL):  <100     mg/dL   Optimal  100-129  mg/dL   Near or Above                    Optimal  130-159  mg/dL   Borderline  160-189  mg/dL   High  >190     mg/dL   Very High   . WBC 03/23/2015 6.4  4.0 - 10.5 K/uL Final  . RBC 03/23/2015 3.65* 3.87 - 5.11 MIL/uL Final  . Hemoglobin 03/23/2015 10.5* 12.0 - 15.0 g/dL Final   Comment: DELTA CHECK NOTED REPEATED TO VERIFY   . HCT 03/23/2015 32.9* 36.0 - 46.0 % Final  . MCV 03/23/2015 90.1  78.0 - 100.0 fL Final  . MCH 03/23/2015 28.8  26.0 - 34.0 pg Final  . MCHC 03/23/2015 31.9  30.0 - 36.0 g/dL Final  . RDW 03/23/2015 14.3  11.5 - 15.5 % Final  . Platelets 03/23/2015 198  150 - 400 K/uL Final  . Creatinine, Ser 03/23/2015 0.97  0.44 - 1.00 mg/dL Final  . GFR calc non Af Amer 03/23/2015 52* >60 mL/min Final  . GFR calc Af Amer 03/23/2015 >60  >60 mL/min Final   Comment: (NOTE) The eGFR has been calculated using the CKD EPI equation. This calculation has not been validated in all clinical situations. eGFR's persistently <60 mL/min signify possible Chronic Kidney Disease.   Marland Kitchen Prothrombin Time 03/23/2015 13.7  11.6 - 15.2 seconds Final  . INR  03/23/2015 1.03  0.00 - 1.49 Final  . Glucose-Capillary 03/23/2015 74  65 - 99 mg/dL Final  . Glucose-Capillary 03/23/2015 82  65 - 99 mg/dL Final  . Glucose-Capillary 03/23/2015 143* 65 - 99 mg/dL Final  . Glucose-Capillary 03/23/2015 75  65 - 99 mg/dL Final  . Glucose-Capillary 03/24/2015 106* 65 - 99 mg/dL Final  . Glucose-Capillary 03/24/2015 91  65 - 99 mg/dL Final  . Glucose-Capillary 03/24/2015 79  65 - 99 mg/dL Final  . Glucose-Capillary 03/24/2015 126* 65 - 99 mg/dL Final  . Glucose-Capillary 03/25/2015 102* 65 - 99 mg/dL Final  . Glucose-Capillary 03/25/2015 298* 65 - 99 mg/dL Final      Medication List       This list is accurate as of: 04/03/15 10:57 AM.  Always use your most recent med list.  acetaminophen 500 MG tablet  Commonly known as:  TYLENOL  Take 1,000 mg by mouth 2 (two) times daily.     albuterol 108 (90 BASE) MCG/ACT inhaler  Commonly known as:  PROVENTIL HFA;VENTOLIN HFA  Inhale 2 puffs into the lungs every 6 (six) hours as needed for wheezing or shortness of breath.     aspirin 325 MG tablet  Take 1 tablet (325 mg total) by mouth daily.     DULERA 200-5 MCG/ACT Aero  Generic drug:  mometasone-formoterol  Inhale 2 puffs into the lungs 2 (two) times daily.     fluticasone 50 MCG/ACT nasal spray  Commonly known as:  FLONASE  Place 2 sprays into the nose daily.     freestyle lancets  Use as instructed to check blood sugars once a day dx code 284.13     ONETOUCH DELICA LANCETS 24M Misc  USE AS DIRECTED TO CHECK BLOOD SUGAR EVERY DAY     gabapentin 100 MG capsule  Commonly known as:  NEURONTIN  Take 100 mg by mouth 2 (two) times daily.     hydrochlorothiazide 25 MG tablet  Commonly known as:  HYDRODIURIL  Take 12.5 mg by mouth daily.     metFORMIN 500 MG tablet  Commonly known as:  GLUCOPHAGE  Take 500 mg by mouth 2 (two) times daily with a meal.     ONE TOUCH ULTRA 2 W/DEVICE Kit  Use to check blood sugar daily dx code  250.00     ONE TOUCH ULTRA TEST test strip  Generic drug:  glucose blood  USE TO CHECK BLOOD SUGAR DAILY AS DIRECTED     permethrin 5 % cream  Commonly known as:  ELIMITE  Apply 1 application topically daily as needed (for irritation).     potassium chloride SA 20 MEQ tablet  Commonly known as:  K-DUR,KLOR-CON  Take 20 mEq by mouth daily.     sitaGLIPtin 100 MG tablet  Commonly known as:  JANUVIA  Take 100 mg by mouth daily.     traZODone 50 MG tablet  Commonly known as:  DESYREL  Take 50 mg by mouth at bedtime.     triamcinolone cream 0.5 %  Commonly known as:  KENALOG  Apply 1 application topically daily as needed (for rash).     Vitamin D (Ergocalciferol) 50000 UNITS Caps capsule  Commonly known as:  DRISDOL  TAKE 1 CAPSULE BY MOUTH TWICE A WEEK     VOLTAREN 1 % Gel  Generic drug:  diclofenac sodium  Apply 2 g topically 4 (four) times daily as needed (for pain).        Allergies:  Allergies  Allergen Reactions  . Morphine And Related Rash    Past Medical History  Diagnosis Date  . Arthritis   . Asthma   . CHF (congestive heart failure) ([place])   . Hypertension   . Diabetes mellitus without complication ([place])   . Chronic headaches   . Constipation   . Hemorrhoids without complication     Past Surgical History  Procedure Laterality Date  . Cholecystectomy    . Back surgery    . Knee surgery    . Abdominal hysterectomy      Family History  Problem Relation Age of Onset  . Diabetes Son   . Hypertension Son     Social History:  reports that she has never smoked. She has never used smokeless tobacco. She reports that she does not drink alcohol or use illicit  drugs.    Review of Systems   She was admitted in the hospital for probable left-sided TIA and is on aspirin, to follow-up with PCP this week       Lipids: Has not been on treatment with a statin drug, levels are normal without statin drugs  Lab Results  Component Value Date   CHOL 139  03/23/2015   HDL 56 03/23/2015   LDLCALC 70 03/23/2015   TRIG 67 03/23/2015   CHOLHDL 2.5 03/23/2015       The blood pressure has been controlled with HCTZ, followed by PCP       She has had chronic anemia.  Has not been on any supplements for this, will be going to her PCP again this week  Lab Results  Component Value Date   WBC 6.4 03/23/2015   HGB 10.5* 03/23/2015   HCT 32.9* 03/23/2015   MCV 90.1 03/23/2015   PLT 198 03/23/2015     No numbness or pains in feet.  On 100 mg gabapentin, ? For nerve pain and she takes this twice a day  Diabetic foot exam in 8/16 showed spotty decrease in monofilament sensation on some of her toes, inconsistent   She has a history of a small goiter in the past, euthyroid  Lab Results  Component Value Date   TSH 1.62 11/15/2013    Physical Examination:  BP 130/64 mmHg  Pulse 78  Temp(Src) 98.5 F (36.9 C)  Resp 16  Ht 5' 2"  (1.575 m)  Wt 222 lb 9.6 oz (100.971 kg)  BMI 40.70 kg/m2  SpO2 96%  No edema in her lower legs     ASSESSMENT/ PLAN:   Diabetes type 2 with obesity, relatively mild and well controlled with 2 drugs including low dose metformin  A1c is excellent at 6.5 considering her age and other medical problems She has lost weight from reducing her portions  Discussed checking blood sugars occasionally after meals Still not able to exercise much but is trying to walk as much as possible Currently taking low dose metformin and Januvia and will continue same regimen  History of hypertension: Controlled with HCTZ  ?  Neuropathy: She is not having any symptoms and can take gabapentin as needed only  She will follow-up with her PCP for other issues including possibly starting a statin drug with her history of CVA   Patient Instructions  Check blood sugars on waking up 2  times a week  Also check blood sugars about 2 hours after a meal and do this after different meals by rotation  Recommended blood sugar levels on  waking up is 90-130 and about 2 hours after meal is 130-160  Please bring your blood sugar monitor to each visit, thank you  Gabapentin as needed only for sharp pain or tingling      Netanya Yazdani 04/03/2015, 10:57 AM

## 2015-04-05 DIAGNOSIS — M5136 Other intervertebral disc degeneration, lumbar region: Secondary | ICD-10-CM | POA: Diagnosis not present

## 2015-04-05 DIAGNOSIS — I10 Essential (primary) hypertension: Secondary | ICD-10-CM | POA: Diagnosis not present

## 2015-04-05 DIAGNOSIS — J4520 Mild intermittent asthma, uncomplicated: Secondary | ICD-10-CM | POA: Diagnosis not present

## 2015-04-05 DIAGNOSIS — E1165 Type 2 diabetes mellitus with hyperglycemia: Secondary | ICD-10-CM | POA: Diagnosis not present

## 2015-04-05 DIAGNOSIS — I509 Heart failure, unspecified: Secondary | ICD-10-CM | POA: Diagnosis not present

## 2015-04-07 ENCOUNTER — Emergency Department (HOSPITAL_COMMUNITY): Payer: Medicare Other

## 2015-04-07 ENCOUNTER — Encounter (HOSPITAL_COMMUNITY): Payer: Self-pay | Admitting: Emergency Medicine

## 2015-04-07 ENCOUNTER — Emergency Department (HOSPITAL_COMMUNITY)
Admission: EM | Admit: 2015-04-07 | Discharge: 2015-04-07 | Disposition: A | Discharge status: Home / Self Care | Payer: Medicare Other | Attending: Emergency Medicine | Admitting: Emergency Medicine

## 2015-04-07 DIAGNOSIS — I10 Essential (primary) hypertension: Secondary | ICD-10-CM | POA: Diagnosis not present

## 2015-04-07 DIAGNOSIS — Z7984 Long term (current) use of oral hypoglycemic drugs: Secondary | ICD-10-CM | POA: Insufficient documentation

## 2015-04-07 DIAGNOSIS — M1990 Unspecified osteoarthritis, unspecified site: Secondary | ICD-10-CM | POA: Insufficient documentation

## 2015-04-07 DIAGNOSIS — Z7951 Long term (current) use of inhaled steroids: Secondary | ICD-10-CM | POA: Insufficient documentation

## 2015-04-07 DIAGNOSIS — Z79899 Other long term (current) drug therapy: Secondary | ICD-10-CM | POA: Diagnosis not present

## 2015-04-07 DIAGNOSIS — I509 Heart failure, unspecified: Secondary | ICD-10-CM | POA: Insufficient documentation

## 2015-04-07 DIAGNOSIS — R402411 Glasgow coma scale score 13-15, in the field [EMT or ambulance]: Secondary | ICD-10-CM | POA: Diagnosis not present

## 2015-04-07 DIAGNOSIS — Z7982 Long term (current) use of aspirin: Secondary | ICD-10-CM | POA: Diagnosis not present

## 2015-04-07 DIAGNOSIS — R404 Transient alteration of awareness: Secondary | ICD-10-CM

## 2015-04-07 DIAGNOSIS — Z8719 Personal history of other diseases of the digestive system: Secondary | ICD-10-CM | POA: Insufficient documentation

## 2015-04-07 DIAGNOSIS — R4182 Altered mental status, unspecified: Secondary | ICD-10-CM | POA: Diagnosis present

## 2015-04-07 DIAGNOSIS — R410 Disorientation, unspecified: Secondary | ICD-10-CM | POA: Diagnosis not present

## 2015-04-07 DIAGNOSIS — J45909 Unspecified asthma, uncomplicated: Secondary | ICD-10-CM | POA: Insufficient documentation

## 2015-04-07 LAB — URINALYSIS, ROUTINE W REFLEX MICROSCOPIC
Bilirubin Urine: NEGATIVE
Glucose, UA: NEGATIVE mg/dL
Hgb urine dipstick: NEGATIVE
Ketones, ur: NEGATIVE mg/dL
LEUKOCYTES UA: NEGATIVE
Nitrite: NEGATIVE
PROTEIN: NEGATIVE mg/dL
SPECIFIC GRAVITY, URINE: 1.006 (ref 1.005–1.030)
pH: 6.5 (ref 5.0–8.0)

## 2015-04-07 LAB — BASIC METABOLIC PANEL
ANION GAP: 9 (ref 5–15)
BUN: 12 mg/dL (ref 6–20)
CO2: 25 mmol/L (ref 22–32)
Calcium: 9 mg/dL (ref 8.9–10.3)
Chloride: 107 mmol/L (ref 101–111)
Creatinine, Ser: 0.86 mg/dL (ref 0.44–1.00)
Glucose, Bld: 82 mg/dL (ref 65–99)
POTASSIUM: 3.9 mmol/L (ref 3.5–5.1)
SODIUM: 141 mmol/L (ref 135–145)

## 2015-04-07 LAB — CBC
HCT: 34.9 % — ABNORMAL LOW (ref 36.0–46.0)
Hemoglobin: 11.1 g/dL — ABNORMAL LOW (ref 12.0–15.0)
MCH: 28.9 pg (ref 26.0–34.0)
MCHC: 31.8 g/dL (ref 30.0–36.0)
MCV: 90.9 fL (ref 78.0–100.0)
PLATELETS: 203 10*3/uL (ref 150–400)
RBC: 3.84 MIL/uL — ABNORMAL LOW (ref 3.87–5.11)
RDW: 14.2 % (ref 11.5–15.5)
WBC: 6 10*3/uL (ref 4.0–10.5)

## 2015-04-07 LAB — ETHANOL

## 2015-04-07 LAB — RAPID URINE DRUG SCREEN, HOSP PERFORMED
Amphetamines: NOT DETECTED
BARBITURATES: NOT DETECTED
Benzodiazepines: NOT DETECTED
Cocaine: NOT DETECTED
Opiates: NOT DETECTED
Tetrahydrocannabinol: NOT DETECTED

## 2015-04-07 LAB — TROPONIN I

## 2015-04-07 LAB — CBG MONITORING, ED: GLUCOSE-CAPILLARY: 135 mg/dL — AB (ref 65–99)

## 2015-04-07 NOTE — ED Notes (Signed)
Per EMS: pt from [facility] independent [facility] facility for eval of altered mental status. EMS reports facility nurses aid was checking on patient and patient said "help me " and laid back on the bed. Upon arrival to ED pt moaning and will answer yes or no questions with  "uh huh or un uh". Pt VSS, PERRLA. Equal grips strength to upper extremities. nad noted.

## 2015-04-07 NOTE — ED Provider Notes (Signed)
CSN: [id number]     Arrival date & time 04/07/15  1159 History   First MD Initiated Contact with Patient 04/07/15 1209     Chief Complaint  Patient presents with  . Altered Mental Status   LEVEL 5 CAVEAT DUE TO ALTERED MENTAL STATUS  Patient is a 81 y.o. female presenting with altered mental status. The history is provided by the patient, the EMS personnel and a relative. The history is limited by the condition of the patient.  Altered Mental Status Presenting symptoms: confusion   Severity:  Severe Most recent episode:  Today Episode history:  Single Duration: UNKNOWN. Timing:  Constant Progression:  Worsening Chronicity:  New Associated symptoms: no fever   patient presents with altered mental status Per EMS, pt was checked on this morning by a tech at assisted living facility She was noted to be confused and stated "help me" Daughter reports she saw her 3 days ago and was normal - awake/alert at that time She did have recent admission for TIA  No other details are known   Past Medical History  Diagnosis Date  . Arthritis   . Asthma   . CHF (congestive heart failure) ([place])   . Hypertension   . Diabetes mellitus without complication ([place])   . Chronic headaches   . Constipation   . Hemorrhoids without complication    Past Surgical History  Procedure Laterality Date  . Cholecystectomy    . Back surgery    . Knee surgery    . Abdominal hysterectomy     Family History  Problem Relation Age of Onset  . Diabetes Son   . Hypertension Son    Social History  Substance Use Topics  . Smoking status: Never Smoker   . Smokeless tobacco: Never Used  . Alcohol Use: No   OB History    No data available     Review of Systems  Unable to perform ROS: Mental status change  Constitutional: Negative for fever.  Psychiatric/Behavioral: Positive for confusion.      Allergies  Morphine and related  Home Medications   Prior to Admission medications   Medication Sig  Start Date End Date Taking? Authorizing Provider  acetaminophen (TYLENOL) 500 MG tablet Take 1,000 mg by mouth 2 (two) times daily.     Historical Provider, MD  albuterol (PROVENTIL HFA;VENTOLIN HFA) 108 (90 BASE) MCG/ACT inhaler Inhale 2 puffs into the lungs every 6 (six) hours as needed for wheezing or shortness of breath.    Historical Provider, MD  aspirin 325 MG tablet Take 1 tablet (325 mg total) by mouth daily. 03/25/15   Charlynne Cousins, MD  Blood Glucose Monitoring Suppl (ONE TOUCH ULTRA 2) W/DEVICE KIT Use to check blood sugar daily dx code 250.00 07/07/13   Elayne Snare, MD  DULERA 200-5 MCG/ACT AERO Inhale 2 puffs into the lungs 2 (two) times daily.  07/15/14   Historical Provider, MD  fluticasone (FLONASE) 50 MCG/ACT nasal spray Place 2 sprays into the nose daily. 02/07/13   Blanchie Dessert, MD  gabapentin (NEURONTIN) 100 MG capsule Take 100 mg by mouth 2 (two) times daily.  10/28/13   Historical Provider, MD  hydrochlorothiazide (HYDRODIURIL) 25 MG tablet Take 12.5 mg by mouth daily.     Historical Provider, MD  Lancets (FREESTYLE) lancets Use as instructed to check blood sugars once a day dx code 250.00 06/23/13   Elayne Snare, MD  metFORMIN (GLUCOPHAGE) 500 MG tablet Take 500 mg by mouth 2 (two) times  daily with a meal.    Historical Provider, MD  ONE TOUCH ULTRA TEST test strip USE TO CHECK BLOOD SUGAR DAILY AS DIRECTED 08/08/14   Elayne Snare, MD  [facility] DELICA LANCETS 29U MISC USE AS DIRECTED TO CHECK BLOOD SUGAR EVERY DAY 12/30/13   Elayne Snare, MD  permethrin (ELIMITE) 5 % cream Apply 1 application topically daily as needed (for irritation).  03/01/15   Historical Provider, MD  potassium chloride SA (K-DUR,KLOR-CON) 20 MEQ tablet Take 20 mEq by mouth daily.    Historical Provider, MD  sitaGLIPtin (JANUVIA) 100 MG tablet Take 100 mg by mouth daily.    Historical Provider, MD  traZODone (DESYREL) 50 MG tablet Take 50 mg by mouth at bedtime.  11/16/14   Historical Provider, MD   triamcinolone cream (KENALOG) 0.5 % Apply 1 application topically daily as needed (for rash).  03/01/15   Historical Provider, MD  Vitamin D, Ergocalciferol, (DRISDOL) 50000 UNITS CAPS capsule TAKE 1 CAPSULE BY MOUTH TWICE A WEEK Patient taking differently: TAKE 1 CAPSULE BY MOUTH ONCE WEEKLY ON SATURDAY 02/24/14   Elayne Snare, MD  VOLTAREN 1 % GEL Apply 2 g topically 4 (four) times daily as needed (for pain).  10/31/14   Historical Provider, MD   BP 165/54 mmHg  Pulse 70  Temp(Src) 97.1 F (36.2 C) (Rectal)  Resp 16  Ht 5' 2"  (1.575 m)  SpO2 99% Physical Exam CONSTITUTIONAL: elderly, frail HEAD: Normocephalic/atraumatic EYES: pupils pinpoint ENMT: Mucous membranes moist NECK: supple no meningeal signs CV: S1/S2 noted, no murmurs/rubs/gallops noted LUNGS: Lungs are clear to auscultation bilaterally, no apparent distress ABDOMEN: soft, nontender NEURO: Pt is resting with eyes closed.  She will not follow all commands.  When I lift her arms, she drops both of them.  She forcibly keeps her eyes closed during exam EXTREMITIES: pulses normal/equal, full ROM SKIN: warm, color normal PSYCH: unable to assess  ED Course  Procedures  12:55 PM tPA in stroke considered but not given due to: Onset over 3-4.5hours  Unknown time of onset of confusion/AMS as I am unable to reach anyone at facility by phone and EMS is no longer present.  All we were told was she reports "help me" to tech at facility and it is unclear when last known well is at this time She had recent workup in hospital for TIA (had left extremity weakness at that time) but this appears to be a new process with confusion See nursing notes from 1243pm for further details No new meds reported by family She is not on narcotics per records 2:26 PM Pt now near baseline She is awake/alert/conversant Labs pending at this time 3:34 PM Workup unremarkable Pt at baseline She is ambulatory at baseline She wants to go home Unclear  cause of AMS, however recent admission this month for TIA reviewed and I don't feel repeat admission/testing is warranted BP 143/53 mmHg  Pulse 63  Temp(Src) 97.1 F (36.2 C) (Rectal)  Resp 15  Ht 5' 2"  (1.575 m)  SpO2 94% I spoke to daughter on phone and also in the room Pt stable to go back to facility  Labs Review Labs Reviewed  CBC - Abnormal; Notable for the following:    RBC 3.84 (*)    Hemoglobin 11.1 (*)    HCT 34.9 (*)    All other components within normal limits  URINALYSIS, ROUTINE W REFLEX MICROSCOPIC (NOT AT [facility]) - Abnormal; Notable for the following:    APPearance CLOUDY (*)  All other components within normal limits  CBG MONITORING, ED - Abnormal; Notable for the following:    Glucose-Capillary 135 (*)    All other components within normal limits  URINE RAPID DRUG SCREEN, HOSP PERFORMED  BASIC METABOLIC PANEL  TROPONIN I  ETHANOL    Imaging Review Ct Head Wo Contrast  04/07/2015  CLINICAL DATA:  81 year old female with confusion, episode of loss of use of the left hand with tremor. *INSERT* initialed EXAM: CT HEAD WITHOUT CONTRAST TECHNIQUE: Contiguous axial images were obtained from the base of the skull through the vertex without intravenous contrast. COMPARISON:  Brain MRI and head CT 03/22/2015 and earlier, FINDINGS: Stable visualized osseous structures. Stable paranasal sinuses and mastoids. No acute orbit or scalp soft tissue findings. There does appear to be mildly asymmetric left hippocampal volume loss which seems to in part correspond to the CT appearance earlier this month which had no acute finding correlation on MRI. Stable gray-white matter differentiation. No cortically based acute infarct identified. No midline shift, mass effect, or evidence of intracranial mass lesion. No acute intracranial hemorrhage identified. No suspicious intracranial vascular hyperdensity. No ventriculomegaly. IMPRESSION: No acute intracranial abnormality. Stable noncontrast  CT appearance of the brain with mild chronic asymmetry of the hippocampal formations and mild for age white matter changes. Electronically Signed   By: Genevie Ann M.D.   On: 04/07/2015 14:05   I have personally reviewed and evaluated these images and lab results as part of my medical decision-making.   EKG Interpretation   Date/Time:  Friday April 07 2015 13:15:11 EST Ventricular Rate:  68 PR Interval:  241 QRS Duration: 151 QT Interval:  420 QTC Calculation: 447 R Axis:   -81 Text Interpretation:  Sinus rhythm Prolonged PR interval Probable left  atrial enlargement RBBB and LAFB artifact in V2 Otherwise no significant  change Confirmed by Christy Gentles  MD, Elenore Rota ([id number]) on 04/07/2015 1:20:03 PM      MDM   Final diagnoses:  Transient alteration of awareness    Nursing notes including past medical history and social history reviewed and considered in documentation Labs/vital reviewed myself and considered during evaluation     Ripley Fraise, MD 04/07/15 1536

## 2015-04-07 NOTE — ED Notes (Signed)
Patient transported to CT in nad,

## 2015-04-07 NOTE — ED Notes (Signed)
Phlebotomy at bedside to redraw labs.

## 2015-04-07 NOTE — ED Notes (Signed)
Pt states to daughter at bedside, "Im tired, Im tired" but pt won't specify why/how she's tired. Pt able to lift arm for blood pressure cuff to be applied but will not hold arms up for neuro exam. Pt will make some statements when talking to daughter and reaching for daughters hand and speech is clear when pt speaks.    Pt states her mouth hurts, pt noted to have upper dentures noted. These were removed and given to daughter at bedside, MD wickline made aware of pt actions.

## 2015-04-12 DIAGNOSIS — I10 Essential (primary) hypertension: Secondary | ICD-10-CM | POA: Diagnosis not present

## 2015-04-12 DIAGNOSIS — Z7984 Long term (current) use of oral hypoglycemic drugs: Secondary | ICD-10-CM | POA: Diagnosis not present

## 2015-04-12 DIAGNOSIS — M5136 Other intervertebral disc degeneration, lumbar region: Secondary | ICD-10-CM | POA: Diagnosis not present

## 2015-04-12 DIAGNOSIS — J45909 Unspecified asthma, uncomplicated: Secondary | ICD-10-CM | POA: Diagnosis not present

## 2015-04-12 DIAGNOSIS — E119 Type 2 diabetes mellitus without complications: Secondary | ICD-10-CM | POA: Diagnosis not present

## 2015-04-13 DIAGNOSIS — E119 Type 2 diabetes mellitus without complications: Secondary | ICD-10-CM | POA: Diagnosis not present

## 2015-04-13 DIAGNOSIS — M5136 Other intervertebral disc degeneration, lumbar region: Secondary | ICD-10-CM | POA: Diagnosis not present

## 2015-04-13 DIAGNOSIS — I10 Essential (primary) hypertension: Secondary | ICD-10-CM | POA: Diagnosis not present

## 2015-04-13 DIAGNOSIS — J45909 Unspecified asthma, uncomplicated: Secondary | ICD-10-CM | POA: Diagnosis not present

## 2015-04-13 DIAGNOSIS — Z7984 Long term (current) use of oral hypoglycemic drugs: Secondary | ICD-10-CM | POA: Diagnosis not present

## 2015-04-14 DIAGNOSIS — E119 Type 2 diabetes mellitus without complications: Secondary | ICD-10-CM | POA: Diagnosis not present

## 2015-04-14 DIAGNOSIS — M5136 Other intervertebral disc degeneration, lumbar region: Secondary | ICD-10-CM | POA: Diagnosis not present

## 2015-04-14 DIAGNOSIS — I509 Heart failure, unspecified: Secondary | ICD-10-CM | POA: Diagnosis not present

## 2015-04-14 DIAGNOSIS — Z7984 Long term (current) use of oral hypoglycemic drugs: Secondary | ICD-10-CM | POA: Diagnosis not present

## 2015-04-14 DIAGNOSIS — I10 Essential (primary) hypertension: Secondary | ICD-10-CM | POA: Diagnosis not present

## 2015-04-14 DIAGNOSIS — J45909 Unspecified asthma, uncomplicated: Secondary | ICD-10-CM | POA: Diagnosis not present

## 2015-04-14 DIAGNOSIS — J4522 Mild intermittent asthma with status asthmaticus: Secondary | ICD-10-CM | POA: Diagnosis not present

## 2015-04-18 DIAGNOSIS — M5136 Other intervertebral disc degeneration, lumbar region: Secondary | ICD-10-CM | POA: Diagnosis not present

## 2015-04-18 DIAGNOSIS — J45909 Unspecified asthma, uncomplicated: Secondary | ICD-10-CM | POA: Diagnosis not present

## 2015-04-18 DIAGNOSIS — M0540 Rheumatoid myopathy with rheumatoid arthritis of unspecified site: Secondary | ICD-10-CM | POA: Diagnosis not present

## 2015-04-18 DIAGNOSIS — J4522 Mild intermittent asthma with status asthmaticus: Secondary | ICD-10-CM | POA: Diagnosis not present

## 2015-04-18 DIAGNOSIS — I10 Essential (primary) hypertension: Secondary | ICD-10-CM | POA: Diagnosis not present

## 2015-04-18 DIAGNOSIS — I509 Heart failure, unspecified: Secondary | ICD-10-CM | POA: Diagnosis not present

## 2015-04-18 DIAGNOSIS — E119 Type 2 diabetes mellitus without complications: Secondary | ICD-10-CM | POA: Diagnosis not present

## 2015-04-19 DIAGNOSIS — I10 Essential (primary) hypertension: Secondary | ICD-10-CM | POA: Diagnosis not present

## 2015-04-19 DIAGNOSIS — E119 Type 2 diabetes mellitus without complications: Secondary | ICD-10-CM | POA: Diagnosis not present

## 2015-04-19 DIAGNOSIS — M5136 Other intervertebral disc degeneration, lumbar region: Secondary | ICD-10-CM | POA: Diagnosis not present

## 2015-04-19 DIAGNOSIS — J45909 Unspecified asthma, uncomplicated: Secondary | ICD-10-CM | POA: Diagnosis not present

## 2015-04-21 DIAGNOSIS — M5136 Other intervertebral disc degeneration, lumbar region: Secondary | ICD-10-CM | POA: Diagnosis not present

## 2015-04-21 DIAGNOSIS — E119 Type 2 diabetes mellitus without complications: Secondary | ICD-10-CM | POA: Diagnosis not present

## 2015-04-21 DIAGNOSIS — I10 Essential (primary) hypertension: Secondary | ICD-10-CM | POA: Diagnosis not present

## 2015-04-21 DIAGNOSIS — J45909 Unspecified asthma, uncomplicated: Secondary | ICD-10-CM | POA: Diagnosis not present

## 2015-04-25 DIAGNOSIS — J45909 Unspecified asthma, uncomplicated: Secondary | ICD-10-CM | POA: Diagnosis not present

## 2015-04-25 DIAGNOSIS — M5136 Other intervertebral disc degeneration, lumbar region: Secondary | ICD-10-CM | POA: Diagnosis not present

## 2015-04-25 DIAGNOSIS — I10 Essential (primary) hypertension: Secondary | ICD-10-CM | POA: Diagnosis not present

## 2015-04-25 DIAGNOSIS — E119 Type 2 diabetes mellitus without complications: Secondary | ICD-10-CM | POA: Diagnosis not present

## 2015-04-26 DIAGNOSIS — E119 Type 2 diabetes mellitus without complications: Secondary | ICD-10-CM | POA: Diagnosis not present

## 2015-04-26 DIAGNOSIS — M5136 Other intervertebral disc degeneration, lumbar region: Secondary | ICD-10-CM | POA: Diagnosis not present

## 2015-04-26 DIAGNOSIS — J45909 Unspecified asthma, uncomplicated: Secondary | ICD-10-CM | POA: Diagnosis not present

## 2015-04-26 DIAGNOSIS — I10 Essential (primary) hypertension: Secondary | ICD-10-CM | POA: Diagnosis not present

## 2015-04-27 DIAGNOSIS — E119 Type 2 diabetes mellitus without complications: Secondary | ICD-10-CM | POA: Diagnosis not present

## 2015-04-27 DIAGNOSIS — I10 Essential (primary) hypertension: Secondary | ICD-10-CM | POA: Diagnosis not present

## 2015-04-27 DIAGNOSIS — M5136 Other intervertebral disc degeneration, lumbar region: Secondary | ICD-10-CM | POA: Diagnosis not present

## 2015-04-27 DIAGNOSIS — J45909 Unspecified asthma, uncomplicated: Secondary | ICD-10-CM | POA: Diagnosis not present

## 2015-04-28 DIAGNOSIS — J45909 Unspecified asthma, uncomplicated: Secondary | ICD-10-CM | POA: Diagnosis not present

## 2015-04-28 DIAGNOSIS — M5136 Other intervertebral disc degeneration, lumbar region: Secondary | ICD-10-CM | POA: Diagnosis not present

## 2015-04-28 DIAGNOSIS — I10 Essential (primary) hypertension: Secondary | ICD-10-CM | POA: Diagnosis not present

## 2015-04-28 DIAGNOSIS — E119 Type 2 diabetes mellitus without complications: Secondary | ICD-10-CM | POA: Diagnosis not present

## 2015-05-01 ENCOUNTER — Other Ambulatory Visit (INDEPENDENT_AMBULATORY_CARE_PROVIDER_SITE_OTHER): Payer: Medicare Other

## 2015-05-01 DIAGNOSIS — E119 Type 2 diabetes mellitus without complications: Secondary | ICD-10-CM | POA: Diagnosis not present

## 2015-05-01 LAB — HEMOGLOBIN A1C: HEMOGLOBIN A1C: 6.4 % (ref 4.6–6.5)

## 2015-05-01 LAB — BASIC METABOLIC PANEL
BUN: 16 mg/dL (ref 6–23)
CHLORIDE: 103 meq/L (ref 96–112)
CO2: 25 mEq/L (ref 19–32)
Calcium: 9.6 mg/dL (ref 8.4–10.5)
Creatinine, Ser: 0.95 mg/dL (ref 0.40–1.20)
GFR: 71.96 mL/min (ref >60.00)
Glucose, Bld: 98 mg/dL (ref 70–99)
POTASSIUM: 4.1 meq/L (ref 3.5–5.1)
SODIUM: 140 meq/L (ref 135–145)

## 2015-05-01 LAB — MICROALBUMIN / CREATININE URINE RATIO
CREATININE, U: 144.4 mg/dL
MICROALB UR: 0.8 mg/dL (ref 0.0–1.9)
MICROALB/CREAT RATIO: 0.6 mg/g (ref 0.0–30.0)

## 2015-05-02 DIAGNOSIS — E119 Type 2 diabetes mellitus without complications: Secondary | ICD-10-CM | POA: Diagnosis not present

## 2015-05-02 DIAGNOSIS — M5136 Other intervertebral disc degeneration, lumbar region: Secondary | ICD-10-CM | POA: Diagnosis not present

## 2015-05-02 DIAGNOSIS — J45909 Unspecified asthma, uncomplicated: Secondary | ICD-10-CM | POA: Diagnosis not present

## 2015-05-02 DIAGNOSIS — I10 Essential (primary) hypertension: Secondary | ICD-10-CM | POA: Diagnosis not present

## 2015-05-03 DIAGNOSIS — E119 Type 2 diabetes mellitus without complications: Secondary | ICD-10-CM | POA: Diagnosis not present

## 2015-05-03 DIAGNOSIS — I10 Essential (primary) hypertension: Secondary | ICD-10-CM | POA: Diagnosis not present

## 2015-05-03 DIAGNOSIS — J45909 Unspecified asthma, uncomplicated: Secondary | ICD-10-CM | POA: Diagnosis not present

## 2015-05-03 DIAGNOSIS — M5136 Other intervertebral disc degeneration, lumbar region: Secondary | ICD-10-CM | POA: Diagnosis not present

## 2015-05-04 ENCOUNTER — Ambulatory Visit (INDEPENDENT_AMBULATORY_CARE_PROVIDER_SITE_OTHER): Payer: Medicare Other | Admitting: Endocrinology

## 2015-05-04 ENCOUNTER — Encounter: Payer: Self-pay | Admitting: Endocrinology

## 2015-05-04 VITALS — BP 138/72 | HR 64 | Temp 97.8°F | Resp 16 | Ht 62.0 in | Wt 220.8 lb

## 2015-05-04 DIAGNOSIS — Z23 Encounter for immunization: Secondary | ICD-10-CM

## 2015-05-04 DIAGNOSIS — M5136 Other intervertebral disc degeneration, lumbar region: Secondary | ICD-10-CM | POA: Diagnosis not present

## 2015-05-04 DIAGNOSIS — J45909 Unspecified asthma, uncomplicated: Secondary | ICD-10-CM | POA: Diagnosis not present

## 2015-05-04 DIAGNOSIS — E119 Type 2 diabetes mellitus without complications: Secondary | ICD-10-CM | POA: Diagnosis not present

## 2015-05-04 DIAGNOSIS — I10 Essential (primary) hypertension: Secondary | ICD-10-CM | POA: Diagnosis not present

## 2015-05-04 NOTE — Progress Notes (Signed)
Patient ID: Angelica Benson, female   DOB: 17-Apr-1928, 82 y.o.   MRN: [id number]   Reason for Appointment : Followup of diabetes and other issues  History of Present Illness          Diagnosis: Type 2 diabetes mellitus, date of diagnosis: 1992       Past history: She is unclear about the date of her diagnosis and treatment history. At onset she had symptoms of frequent urination, weakness and sleepiness. She has been on various oral hypoglycemic drugs since her diagnosis. Has been on metformin since at least 2006 and Januvia since 2007. Also previously had taken glyburide and Avandia Her A1c has generally been in the 5.9-6.4 range previously  Recent history:  She is on Januvia and low dose metformin with fairly consistent control, A1c near 6.5 consistently  Checking blood sugars somewhat  more frequently recently but still doing them mostly in the mornings and occasionally in the afternoons Blood sugar levels as below:  Fasting blood sugars range from 100-148  Afternoon and suppertime readings 92-124  Highest reading 194 at about 7 PM  Oral hypoglycemic drugs the patient is taking are: Metformin 500 mg twice a day, Januvia 100      Side effects from medications have been: None  Glucose monitoring:  done irregularly and only in the morning usually.   Glucometer: One Touch.       Blood Glucose readings from download as above :   Hypoglycemia:   none  Self-care: The diet that the patient has been following is: tries to limit high-fat foods    Meals: 3 meals per day.  at breakfast has instant oatmeal and toast. At lunch will have sandwich and fruit; dinner is chicken, starch and vegetables; her snacks are fruit, peanut butter or cheese crackers Trying to keep portions small and has lost a couple of pounds           Exercise:    will walk around her living area, she thinks she is walking about quarter mile now  Dietician visit: Most recent: 2000            Compliance with  the medical regimen: Good  Weight history:  Wt Readings from Last 3 Encounters:  05/04/15 220 lb 12.8 oz (100.154 kg)  04/03/15 222 lb 9.6 oz (100.971 kg)  03/22/15 226 lb (102.513 kg)   Glycemic control:   Lab Results  Component Value Date   HGBA1C 6.4 05/01/2015   HGBA1C 6.5* 03/23/2015   HGBA1C 6.4 03/20/2015   Lab Results  Component Value Date   MICROALBUR 0.8 05/01/2015   LDLCALC 70 03/23/2015   CREATININE 0.95 05/01/2015      Lab on 05/01/2015  Component Date Value Ref Range Status  . Hgb A1c MFr Bld 05/01/2015 6.4  4.6 - 6.5 % Final   Glycemic Control Guidelines for People with Diabetes:Non Diabetic:  <6%Goal of Therapy: <7%Additional Action Suggested:  >8%   . Sodium 05/01/2015 140  135 - 145 mEq/L Final  . Potassium 05/01/2015 4.1  3.5 - 5.1 mEq/L Final  . Chloride 05/01/2015 103  96 - 112 mEq/L Final  . CO2 05/01/2015 25  19 - 32 mEq/L Final  . Glucose, Bld 05/01/2015 98  70 - 99 mg/dL Final  . BUN 05/01/2015 16  6 - 23 mg/dL Final  . Creatinine, Ser 05/01/2015 0.95  0.40 - 1.20 mg/dL Final  . Calcium 05/01/2015 9.6  8.4 - 10.5 mg/dL Final  .  GFR 05/01/2015 71.96  >60.00 mL/min Final  . Microalb, Ur 05/01/2015 0.8  0.0 - 1.9 mg/dL Final  . Creatinine,U 05/01/2015 144.4   Final  . Microalb Creat Ratio 05/01/2015 0.6  0.0 - 30.0 mg/g Final      Medication List       This list is accurate as of: 05/04/15 12:36 PM.  Always use your most recent med list.               acetaminophen 500 MG tablet  Commonly known as:  TYLENOL  Take 1,000 mg by mouth 2 (two) times daily.     albuterol 108 (90 Base) MCG/ACT inhaler  Commonly known as:  PROVENTIL HFA;VENTOLIN HFA  Inhale 2 puffs into the lungs every 6 (six) hours as needed for wheezing or shortness of breath.     aspirin 325 MG tablet  Take 1 tablet (325 mg total) by mouth daily.     DULERA 200-5 MCG/ACT Aero  Generic drug:  mometasone-formoterol  Inhale 2 puffs into the lungs 2 (two) times daily.       fluticasone 50 MCG/ACT nasal spray  Commonly known as:  FLONASE  Place 2 sprays into the nose daily.     freestyle lancets  Use as instructed to check blood sugars once a day dx code 073.71     ONETOUCH DELICA LANCETS 06Y Misc  USE AS DIRECTED TO CHECK BLOOD SUGAR EVERY DAY     gabapentin 100 MG capsule  Commonly known as:  NEURONTIN  Take 100 mg by mouth 2 (two) times daily.     hydrochlorothiazide 25 MG tablet  Commonly known as:  HYDRODIURIL  Take 12.5 mg by mouth daily.     hydrOXYzine 25 MG tablet  Commonly known as:  ATARAX/VISTARIL     metFORMIN 500 MG tablet  Commonly known as:  GLUCOPHAGE  Take 500 mg by mouth 2 (two) times daily with a meal.     ONE TOUCH ULTRA 2 w/Device Kit  Use to check blood sugar daily dx code 250.00     ONE TOUCH ULTRA TEST test strip  Generic drug:  glucose blood  USE TO CHECK BLOOD SUGAR DAILY AS DIRECTED     permethrin 5 % cream  Commonly known as:  ELIMITE  Apply 1 application topically daily as needed (for irritation).     potassium chloride SA 20 MEQ tablet  Commonly known as:  K-DUR,KLOR-CON  Take 20 mEq by mouth daily.     sitaGLIPtin 100 MG tablet  Commonly known as:  JANUVIA  Take 100 mg by mouth daily.     traZODone 50 MG tablet  Commonly known as:  DESYREL  Take 50 mg by mouth at bedtime.     triamcinolone cream 0.5 %  Commonly known as:  KENALOG  Apply 1 application topically daily as needed (for rash).     Vitamin D (Ergocalciferol) 50000 units Caps capsule  Commonly known as:  DRISDOL  TAKE 1 CAPSULE BY MOUTH TWICE A WEEK     VOLTAREN 1 % Gel  Generic drug:  diclofenac sodium  Apply 2 g topically 4 (four) times daily as needed (for pain).        Allergies:  Allergies  Allergen Reactions  . Morphine And Related Rash    Past Medical History  Diagnosis Date  . Arthritis   . Asthma   . CHF (congestive heart failure) ([place])   . Hypertension   . Diabetes mellitus without complication ([place])   .  Chronic headaches   . Constipation   . Hemorrhoids without complication     Past Surgical History  Procedure Laterality Date  . Cholecystectomy    . Back surgery    . Knee surgery    . Abdominal hysterectomy      Family History  Problem Relation Age of Onset  . Diabetes Son   . Hypertension Son     Social History:  reports that she has never smoked. She has never used smokeless tobacco. She reports that she does not drink alcohol or use illicit drugs.    Review of Systems   She was admitted in the hospital for probable left-sided TIA and is on aspirin, to follow-up with neurologist        Lipids: Has not been on treatment with a statin drug, levels are normal without statin drugs  Lab Results  Component Value Date   CHOL 139 03/23/2015   HDL 56 03/23/2015   LDLCALC 70 03/23/2015   TRIG 67 03/23/2015   CHOLHDL 2.5 03/23/2015       The blood pressure has been controlled with HCTZ, followed by PCP       She has had chronic anemia followed by PCP   Lab Results  Component Value Date   WBC 6.0 04/07/2015   HGB 11.1* 04/07/2015   HCT 34.9* 04/07/2015   MCV 90.9 04/07/2015   PLT 203 04/07/2015     No numbness or pains in feet.  On 100 mg gabapentin, ? For nerve pain and she takes this twice a day, was told to take this as needed only but still taking it regularly  Diabetic foot exam in 8/16 showed spotty decrease in monofilament sensation on some of her toes, inconsistent   She has a history of a small goiter in the past, euthyroid  Lab Results  Component Value Date   TSH 1.62 11/15/2013    Physical Examination:  BP 138/72 mmHg  Pulse 64  Temp(Src) 97.8 F (36.6 C)  Resp 16  Ht _0  (1.575 m)  Wt 220 lb 12.8 oz (100.154 kg)  BMI 40.37 kg/m2  SpO2 97%  Diabetic Foot Exam - Simple   Simple Foot Form  Diabetic Foot exam was performed with the following findings:  Yes   Visual Inspection  No deformities, no ulcerations, no other skin breakdown  bilaterally:  Yes  Sensation Testing  Intact to touch and monofilament testing bilaterally:  Yes  Pulse Check  Posterior Tibialis and Dorsalis pulse intact bilaterally:  Yes  Comments         ASSESSMENT/ PLAN:   Diabetes type 2 with obesity, relatively mild and well controlled with 2 drugs including low dose metformin  A1c is excellent at 6.4 considering her age and other medical problems She is trying to be more active Also watching portions and weight is slowly coming down  Currently taking low dose metformin and Januvia and will continue same regimen  ?  Neuropathy: She is not having any symptoms and can take gabapentin as needed only, Reviewed written instructions   history of CVA: Recommend that she take Lipitor 10 mg daily, forward information to PCP   Patient Instructions  Take Gabapentin only as needed for sharp leg pains  Check blood sugars on waking up 1-2  times a week Also check blood sugars about 2 hours after a meal and do this after different meals by rotation  Recommended blood sugar levels on waking up is 90-130 and about 2  hours after meal is 130-160  Please bring your blood sugar monitor to each visit, thank you      [facility] 05/04/2015, 12:36 PM

## 2015-05-04 NOTE — Patient Instructions (Signed)
Take Gabapentin only as needed for sharp leg pains  Check blood sugars on waking up 1-2  times a week Also check blood sugars about 2 hours after a meal and do this after different meals by rotation  Recommended blood sugar levels on waking up is 90-130 and about 2 hours after meal is 130-160  Please bring your blood sugar monitor to each visit, thank you

## 2015-05-08 DIAGNOSIS — J45909 Unspecified asthma, uncomplicated: Secondary | ICD-10-CM | POA: Diagnosis not present

## 2015-05-08 DIAGNOSIS — M5136 Other intervertebral disc degeneration, lumbar region: Secondary | ICD-10-CM | POA: Diagnosis not present

## 2015-05-08 DIAGNOSIS — E119 Type 2 diabetes mellitus without complications: Secondary | ICD-10-CM | POA: Diagnosis not present

## 2015-05-08 DIAGNOSIS — I10 Essential (primary) hypertension: Secondary | ICD-10-CM | POA: Diagnosis not present

## 2015-05-09 DIAGNOSIS — J4522 Mild intermittent asthma with status asthmaticus: Secondary | ICD-10-CM | POA: Diagnosis not present

## 2015-05-09 DIAGNOSIS — E119 Type 2 diabetes mellitus without complications: Secondary | ICD-10-CM | POA: Diagnosis not present

## 2015-05-09 DIAGNOSIS — I10 Essential (primary) hypertension: Secondary | ICD-10-CM | POA: Diagnosis not present

## 2015-05-09 DIAGNOSIS — R32 Unspecified urinary incontinence: Secondary | ICD-10-CM | POA: Diagnosis not present

## 2015-05-09 DIAGNOSIS — J45909 Unspecified asthma, uncomplicated: Secondary | ICD-10-CM | POA: Diagnosis not present

## 2015-05-09 DIAGNOSIS — M0540 Rheumatoid myopathy with rheumatoid arthritis of unspecified site: Secondary | ICD-10-CM | POA: Diagnosis not present

## 2015-05-09 DIAGNOSIS — I509 Heart failure, unspecified: Secondary | ICD-10-CM | POA: Diagnosis not present

## 2015-05-09 DIAGNOSIS — M5136 Other intervertebral disc degeneration, lumbar region: Secondary | ICD-10-CM | POA: Diagnosis not present

## 2015-05-10 ENCOUNTER — Ambulatory Visit: Payer: Medicare Other | Admitting: Nurse Practitioner

## 2015-05-11 ENCOUNTER — Encounter (INDEPENDENT_AMBULATORY_CARE_PROVIDER_SITE_OTHER): Payer: Self-pay

## 2015-05-11 ENCOUNTER — Encounter: Payer: Self-pay | Admitting: Nurse Practitioner

## 2015-05-11 ENCOUNTER — Ambulatory Visit (INDEPENDENT_AMBULATORY_CARE_PROVIDER_SITE_OTHER): Payer: Medicare Other | Admitting: Nurse Practitioner

## 2015-05-11 VITALS — BP 135/71 | HR 74 | Ht 62.0 in | Wt 224.6 lb

## 2015-05-11 DIAGNOSIS — E669 Obesity, unspecified: Secondary | ICD-10-CM

## 2015-05-11 DIAGNOSIS — G458 Other transient cerebral ischemic attacks and related syndromes: Secondary | ICD-10-CM | POA: Diagnosis not present

## 2015-05-11 DIAGNOSIS — E1100 Type 2 diabetes mellitus with hyperosmolarity without nonketotic hyperglycemic-hyperosmolar coma (NKHHC): Secondary | ICD-10-CM | POA: Diagnosis not present

## 2015-05-11 DIAGNOSIS — I10 Essential (primary) hypertension: Secondary | ICD-10-CM

## 2015-05-11 NOTE — Progress Notes (Signed)
I reviewed above note and agree with the assessment and plan.  I saw the her in hospital. At that time, in addition to the left hand weakness, she also noted pain shooting from left hand all the way up to her head, symptoms lasted around 5-10 minutes. I think she may have right brain TIA vs. Cervical radiculopathy. Therefore, I also suggest outpt EMG/NCS to rule out cervical radiculopathy. Thank you.  Rosalin Hawking, MD PhD Stroke Neurology 05/11/2015 6:25 PM

## 2015-05-11 NOTE — Progress Notes (Signed)
[facility]  PATIENT: Angelica Benson DOB: 1929/02/20   REASON FOR VISIT hospital follow-up for TIA,  HISTORY FROM: Patient    HISTORY OF PRESENT ILLNESS: Ms. Leaver, 82 year old female returns for follow-up after hospital admission for TIA in December. On 03/22/2015 she presented with transient left upper extremity weakness and she developed difficulty using her left hand. At the time she was having shooting pain from the left hand all the way to the head. Her symptoms lasted about 5-10 minutes. There was no numbness features visual deficits no prior TIA or CVA. TPA not administered secondary to symptoms resolved. MRI of the head without acute intracranial infarct or other abnormality age-appropriate cerebral atrophy and mild chronic small vessel ischemic disease. Normal MRA of the head. Carotid Doppler bilateral 1-39% ICA stenosis. 2-D echo with mild LVH with LVEF 60-65% and grossly normal diastolic function. No obvious PFO. She was on aspirin 0.81 prior to admission now on aspirin 325 daily. She has risk factors of hypertension, diabetes well controlled advanced age,  morbid obesity. She has not had further stroke or TIA symptoms. She had a month of home health physical therapy and occupational therapy. She is now doing her home exercise program. She has an in-home Aide  that helps 4 hours a day 7 days a week, she does require some assistance with bathing and dressing. She ambulates with a walker and denies any recent falls. She returns for reevaluation   REVIEW OF SYSTEMS: Full 14 system review of systems performed and notable only for those listed, all others are neg:  Constitutional: neg  Cardiovascular: neg Ear/Nose/Throat: Hearing loss  Skin: neg Eyes: Blurred vision Respiratory: neg Gastroitestinal: Urinary incontinence  Hematology/Lymphatic: Easy bruising  Endocrine: neg Musculoskeletal:neg Allergy/Immunology: neg Neurological: Headache Psychiatric:  Decreased energy  Sleep : Restless leg   ALLERGIES: Allergies  Allergen Reactions  . Morphine And Related Rash    HOME MEDICATIONS: Outpatient Prescriptions Prior to Visit  Medication Sig Dispense Refill  . acetaminophen (TYLENOL) 500 MG tablet Take 1,000 mg by mouth 2 (two) times daily.     Marland Kitchen albuterol (PROVENTIL HFA;VENTOLIN HFA) 108 (90 BASE) MCG/ACT inhaler Inhale 2 puffs into the lungs every 6 (six) hours as needed for wheezing or shortness of breath.    Marland Kitchen aspirin 325 MG tablet Take 1 tablet (325 mg total) by mouth daily. 30 tablet 0  . Blood Glucose Monitoring Suppl (ONE TOUCH ULTRA 2) W/DEVICE KIT Use to check blood sugar daily dx code 250.00 1 each 0  . DULERA 200-5 MCG/ACT AERO Inhale 2 puffs into the lungs 2 (two) times daily.     . fluticasone (FLONASE) 50 MCG/ACT nasal spray Place 2 sprays into the nose daily. 16 g 2  . gabapentin (NEURONTIN) 100 MG capsule Take 100 mg by mouth 2 (two) times daily.     . hydrochlorothiazide (HYDRODIURIL) 25 MG tablet Take 12.5 mg by mouth daily.     . hydrOXYzine (ATARAX/VISTARIL) 25 MG tablet     . metFORMIN (GLUCOPHAGE) 500 MG tablet Take 500 mg by mouth 2 (two) times daily with a meal.    . ONE TOUCH ULTRA TEST test strip USE TO CHECK BLOOD SUGAR DAILY AS DIRECTED 50 each 3  . ONETOUCH DELICA LANCETS 11B MISC USE AS DIRECTED TO CHECK BLOOD SUGAR EVERY DAY 100 each 3  . permethrin (ELIMITE) 5 % cream Apply 1 application topically daily as needed (for irritation).     . potassium chloride SA (K-DUR,KLOR-CON) 20 MEQ  tablet Take 20 mEq by mouth daily.    . sitaGLIPtin (JANUVIA) 100 MG tablet Take 100 mg by mouth daily.    . traZODone (DESYREL) 50 MG tablet Take 50 mg by mouth at bedtime.     . triamcinolone cream (KENALOG) 0.5 % Apply 1 application topically daily as needed (for rash).     . Vitamin D, Ergocalciferol, (DRISDOL) 50000 UNITS CAPS capsule TAKE 1 CAPSULE BY MOUTH TWICE A WEEK (Patient taking differently: TAKE 1 CAPSULE BY MOUTH  ONCE WEEKLY ON SATURDAY) 8 capsule 2  . VOLTAREN 1 % GEL Apply 2 g topically 4 (four) times daily as needed (for pain).     . Lancets (FREESTYLE) lancets Use as instructed to check blood sugars once a day dx code 250.00 (Patient not taking: Reported on 05/11/2015) 100 each 1   No facility-administered medications prior to visit.    PAST MEDICAL HISTORY: Past Medical History  Diagnosis Date  . Arthritis   . Asthma   . CHF (congestive heart failure) ([place])   . Hypertension   . Diabetes mellitus without complication ([place])   . Chronic headaches   . Constipation   . Hemorrhoids without complication     PAST SURGICAL HISTORY: Past Surgical History  Procedure Laterality Date  . Cholecystectomy    . Back surgery    . Knee surgery    . Abdominal hysterectomy      FAMILY HISTORY: Family History  Problem Relation Age of Onset  . Diabetes Son   . Hypertension Son   . Cancer Sister     breast  . Diabetes Daughter     plus 2 grandkids  . Stroke Son     51moage dx, has enlarged heart  . Heart disease Son     passed away    SOCIAL HISTORY: Social History   Social History  . Marital Status: Single    Spouse Name: N/A  . Number of Children: N/A  . Years of Education: N/A   Occupational History  . Not on file.   Social History Main Topics  . Smoking status: Never Smoker   . Smokeless tobacco: Never Used  . Alcohol Use: No  . Drug Use: No  . Sexual Activity: Not on file   Other Topics Concern  . Not on file   Social History Narrative   Lives at home by herself, aide in the am.     Has 4 daughters and 3 living sons (one son died).    Uses walker.          PHYSICAL EXAM  Filed Vitals:   05/11/15 1040  BP: 135/71  Pulse: 74  Height: 5' 2" (1.575 m)  Weight: 224 lb 9.6 oz (101.878 kg)   Body mass index is 41.07 kg/(m^2).  Generalized: Well developed, morbidly obese female in no acute distress  Head: normocephalic and atraumatic,. Oropharynx benign  Neck:  Supple, no carotid bruits  Cardiac: Regular rate rhythm, no murmur  Musculoskeletal: No deformity   Neurological examination   Mentation: Alert oriented to time, place, history taking. Attention span and concentration appropriate. Recent and remote memory intact.  Follows all commands speech and language fluent.   Cranial nerve II-XII: Fundi not visualized Pupils were equal round reactive to light extraocular movements were full, visual field were full on confrontational test. Facial sensation and strength were normal. hearing was intact to finger rubbing bilaterally. Uvula tongue midline. head turning and shoulder shrug were normal and symmetric.Tongue protrusion into  cheek strength was normal. Motor: normal bulk and tone, full strength in the BUE, BLE, fine finger movements normal, no pronator drift. No focal weakness Sensory: normal and symmetric to light touch, pinprick, and  Vibration, proprioception  Coordination: finger-nose-finger, heel-to-shin bilaterally, no dysmetria Gait and Station: Rising up from seated position without assistance, wide based  stance,  moderate stride, ambulates with a walker no difficulty with turns   DIAGNOSTIC DATA (LABS, IMAGING, TESTING) - I reviewed patient records, labs, notes, testing and imaging myself where available.  Lab Results  Component Value Date   WBC 6.0 04/07/2015   HGB 11.1* 04/07/2015   HCT 34.9* 04/07/2015   MCV 90.9 04/07/2015   PLT 203 04/07/2015      Component Value Date/Time   NA 140 05/01/2015 0939   K 4.1 05/01/2015 0939   CL 103 05/01/2015 0939   CO2 25 05/01/2015 0939   GLUCOSE 98 05/01/2015 0939   BUN 16 05/01/2015 0939   CREATININE 0.95 05/01/2015 0939   CALCIUM 9.6 05/01/2015 0939   PROT 7.0 03/22/2015 2003   ALBUMIN 3.8 03/22/2015 2003   AST 24 03/22/2015 2003   ALT 11* 03/22/2015 2003   ALKPHOS 62 03/22/2015 2003   BILITOT 0.5 03/22/2015 2003   GFRNONAA >60 04/07/2015 1413   GFRAA >60 04/07/2015 1413   Lab  Results  Component Value Date   CHOL 139 03/23/2015   HDL 56 03/23/2015   LDLCALC 70 03/23/2015   TRIG 67 03/23/2015   CHOLHDL 2.5 03/23/2015   Lab Results  Component Value Date   HGBA1C 6.4 05/01/2015   No results found for: [id number] Lab Results  Component Value Date   TSH 1.62 11/15/2013      ASSESSMENT AND PLAN  82 y.o. year old female  has a past medical history of Arthritis; Asthma; CHF (congestive heart failure) ([place]); Hypertension; Diabetes mellitus without complication ([place]); Chronic headaches; TIA in December 2016, anemia, and cervical disc disorder here to follow-up for recent TIA. Hospital records reviewed  Plan  Management of stroke/ TIA risk factors Keep B/P less than 130 todays reading 135/71 Keep LDL less than 70 at goal no statin necessary Keep HbgA1C less than 6.5 .Most recent 6.4.  Healthy diet low carb low fat Continue home exercise program Stay on ASA 325 mg daily for secondary stroke prevention F/U in 3 monthsVst time 30 min Dennie Bible, [facility], [facility], [facility] [address], [place],  [address] [phone]

## 2015-05-11 NOTE — Patient Instructions (Addendum)
Keep B/P less than 130 todays reading 135/71 Keep LDL less than 70 at goal no statin necessary Keep HbgA1C less than 6.5 Healthy diet low carb low fat Continue home exercise program Stay on ASA 325 mg daily for secondary stroke prevention F/U in 3 months

## 2015-05-17 DIAGNOSIS — M179 Osteoarthritis of knee, unspecified: Secondary | ICD-10-CM | POA: Diagnosis not present

## 2015-05-17 DIAGNOSIS — J4520 Mild intermittent asthma, uncomplicated: Secondary | ICD-10-CM | POA: Diagnosis not present

## 2015-05-17 DIAGNOSIS — E1165 Type 2 diabetes mellitus with hyperglycemia: Secondary | ICD-10-CM | POA: Diagnosis not present

## 2015-05-17 DIAGNOSIS — I10 Essential (primary) hypertension: Secondary | ICD-10-CM | POA: Diagnosis not present

## 2015-05-17 DIAGNOSIS — I509 Heart failure, unspecified: Secondary | ICD-10-CM | POA: Diagnosis not present

## 2015-06-07 DIAGNOSIS — H903 Sensorineural hearing loss, bilateral: Secondary | ICD-10-CM | POA: Insufficient documentation

## 2015-06-07 DIAGNOSIS — M5010 Cervical disc disorder with radiculopathy, unspecified cervical region: Secondary | ICD-10-CM | POA: Insufficient documentation

## 2015-06-07 DIAGNOSIS — J3489 Other specified disorders of nose and nasal sinuses: Secondary | ICD-10-CM | POA: Diagnosis not present

## 2015-06-07 DIAGNOSIS — Z974 Presence of external hearing-aid: Secondary | ICD-10-CM | POA: Diagnosis not present

## 2015-06-08 DIAGNOSIS — J4522 Mild intermittent asthma with status asthmaticus: Secondary | ICD-10-CM | POA: Diagnosis not present

## 2015-06-08 DIAGNOSIS — R32 Unspecified urinary incontinence: Secondary | ICD-10-CM | POA: Diagnosis not present

## 2015-06-08 DIAGNOSIS — E119 Type 2 diabetes mellitus without complications: Secondary | ICD-10-CM | POA: Diagnosis not present

## 2015-06-08 DIAGNOSIS — I509 Heart failure, unspecified: Secondary | ICD-10-CM | POA: Diagnosis not present

## 2015-06-08 DIAGNOSIS — M0540 Rheumatoid myopathy with rheumatoid arthritis of unspecified site: Secondary | ICD-10-CM | POA: Diagnosis not present

## 2015-06-12 DIAGNOSIS — J45909 Unspecified asthma, uncomplicated: Secondary | ICD-10-CM | POA: Diagnosis not present

## 2015-06-12 DIAGNOSIS — M5136 Other intervertebral disc degeneration, lumbar region: Secondary | ICD-10-CM | POA: Diagnosis not present

## 2015-06-12 DIAGNOSIS — I10 Essential (primary) hypertension: Secondary | ICD-10-CM | POA: Diagnosis not present

## 2015-06-12 DIAGNOSIS — E119 Type 2 diabetes mellitus without complications: Secondary | ICD-10-CM | POA: Diagnosis not present

## 2015-06-20 DIAGNOSIS — E119 Type 2 diabetes mellitus without complications: Secondary | ICD-10-CM | POA: Diagnosis not present

## 2015-06-20 DIAGNOSIS — I509 Heart failure, unspecified: Secondary | ICD-10-CM | POA: Diagnosis not present

## 2015-06-20 DIAGNOSIS — M0540 Rheumatoid myopathy with rheumatoid arthritis of unspecified site: Secondary | ICD-10-CM | POA: Diagnosis not present

## 2015-06-20 DIAGNOSIS — J4522 Mild intermittent asthma with status asthmaticus: Secondary | ICD-10-CM | POA: Diagnosis not present

## 2015-06-29 ENCOUNTER — Ambulatory Visit: Payer: Medicare Other | Admitting: Podiatry

## 2015-07-04 DIAGNOSIS — M0540 Rheumatoid myopathy with rheumatoid arthritis of unspecified site: Secondary | ICD-10-CM | POA: Diagnosis not present

## 2015-07-04 DIAGNOSIS — J4522 Mild intermittent asthma with status asthmaticus: Secondary | ICD-10-CM | POA: Diagnosis not present

## 2015-07-04 DIAGNOSIS — E119 Type 2 diabetes mellitus without complications: Secondary | ICD-10-CM | POA: Diagnosis not present

## 2015-07-04 DIAGNOSIS — I509 Heart failure, unspecified: Secondary | ICD-10-CM | POA: Diagnosis not present

## 2015-07-17 DIAGNOSIS — J4522 Mild intermittent asthma with status asthmaticus: Secondary | ICD-10-CM | POA: Diagnosis not present

## 2015-07-17 DIAGNOSIS — I509 Heart failure, unspecified: Secondary | ICD-10-CM | POA: Diagnosis not present

## 2015-07-17 DIAGNOSIS — M0540 Rheumatoid myopathy with rheumatoid arthritis of unspecified site: Secondary | ICD-10-CM | POA: Diagnosis not present

## 2015-07-17 DIAGNOSIS — E119 Type 2 diabetes mellitus without complications: Secondary | ICD-10-CM | POA: Diagnosis not present

## 2015-07-25 DIAGNOSIS — R160 Hepatomegaly, not elsewhere classified: Secondary | ICD-10-CM | POA: Diagnosis not present

## 2015-07-27 DIAGNOSIS — E119 Type 2 diabetes mellitus without complications: Secondary | ICD-10-CM | POA: Diagnosis not present

## 2015-07-27 DIAGNOSIS — I509 Heart failure, unspecified: Secondary | ICD-10-CM | POA: Diagnosis not present

## 2015-07-27 DIAGNOSIS — J4522 Mild intermittent asthma with status asthmaticus: Secondary | ICD-10-CM | POA: Diagnosis not present

## 2015-07-27 DIAGNOSIS — M0540 Rheumatoid myopathy with rheumatoid arthritis of unspecified site: Secondary | ICD-10-CM | POA: Diagnosis not present

## 2015-08-07 DIAGNOSIS — E119 Type 2 diabetes mellitus without complications: Secondary | ICD-10-CM | POA: Diagnosis not present

## 2015-08-07 DIAGNOSIS — I509 Heart failure, unspecified: Secondary | ICD-10-CM | POA: Diagnosis not present

## 2015-08-07 DIAGNOSIS — M0540 Rheumatoid myopathy with rheumatoid arthritis of unspecified site: Secondary | ICD-10-CM | POA: Diagnosis not present

## 2015-08-07 DIAGNOSIS — J4522 Mild intermittent asthma with status asthmaticus: Secondary | ICD-10-CM | POA: Diagnosis not present

## 2015-08-07 DIAGNOSIS — R32 Unspecified urinary incontinence: Secondary | ICD-10-CM | POA: Diagnosis not present

## 2015-08-09 ENCOUNTER — Ambulatory Visit (INDEPENDENT_AMBULATORY_CARE_PROVIDER_SITE_OTHER): Payer: Medicare Other | Admitting: Nurse Practitioner

## 2015-08-09 ENCOUNTER — Encounter: Payer: Self-pay | Admitting: Nurse Practitioner

## 2015-08-09 ENCOUNTER — Other Ambulatory Visit: Payer: Self-pay | Admitting: Endocrinology

## 2015-08-09 VITALS — BP 120/66 | HR 73 | Ht 62.0 in | Wt 217.4 lb

## 2015-08-09 DIAGNOSIS — M79642 Pain in left hand: Secondary | ICD-10-CM | POA: Diagnosis not present

## 2015-08-09 DIAGNOSIS — M542 Cervicalgia: Secondary | ICD-10-CM | POA: Diagnosis not present

## 2015-08-09 DIAGNOSIS — I10 Essential (primary) hypertension: Secondary | ICD-10-CM | POA: Diagnosis not present

## 2015-08-09 DIAGNOSIS — M5010 Cervical disc disorder with radiculopathy, unspecified cervical region: Secondary | ICD-10-CM

## 2015-08-09 DIAGNOSIS — E1100 Type 2 diabetes mellitus with hyperosmolarity without nonketotic hyperglycemic-hyperosmolar coma (NKHHC): Secondary | ICD-10-CM | POA: Diagnosis not present

## 2015-08-09 DIAGNOSIS — G458 Other transient cerebral ischemic attacks and related syndromes: Secondary | ICD-10-CM

## 2015-08-09 DIAGNOSIS — E669 Obesity, unspecified: Secondary | ICD-10-CM | POA: Diagnosis not present

## 2015-08-09 MED ORDER — ONETOUCH DELICA LANCETS 33G MISC: Status: DC

## 2015-08-09 NOTE — Patient Instructions (Signed)
Plan Management of stroke/ TIA risk factors Keep B/P less than 130 todays reading 120/66 Keep LDL less than 70 at goal no statin necessary Keep HbgA1C less than 6.5 .Most recent 6.4.  Healthy diet low carb low fat Continue home exercise program Stay on ASA 325 mg daily for secondary stroke prevention Will check EMG/[place] for left hand pain shooting to neck  F/U in 4

## 2015-08-09 NOTE — Progress Notes (Signed)
[facility]  PATIENT: Angelica Benson DOB: 1929-01-09   REASON FOR VISIT: Follow-up for TIA, with risk factors of hypertension, diabetes and obesity HISTORY FROM: Patient    HISTORY OF PRESENT ILLNESS: Angelica Benson, 82 year old female returns for follow-up. She had hospital admission for TIA 03/22/2015. On return visit today she continues to complain of neck pain and left hand pain that shoots up to the neck.  She is currently taking aspirin for secondary stroke prevention without further stroke or TIA symptoms. She claims her diabetes is a little high. She claims she is doing her home exercise program. She has an aide  7 days a week for 3 hours a day to assist with ADLs. She ambulates with a rolling walker and denies any falls. She returns for reevaluation.     HISTORY: 05/10/15 CMMs. Angelica Benson, 82 year old female returns for follow-up after hospital admission for TIA in December. On 03/22/2015 she presented with transient left upper extremity weakness and she developed difficulty using her left hand. At the time she was having shooting pain from the left hand all the way to the head. Her symptoms lasted about 5-10 minutes. There was no numbness features visual deficits no prior TIA or CVA. TPA not administered secondary to symptoms resolved. MRI of the head without acute intracranial infarct or other abnormality age-appropriate cerebral atrophy and mild chronic small vessel ischemic disease. Normal MRA of the head. Carotid Doppler bilateral 1-39% ICA stenosis. 2-D echo with mild LVH with LVEF 60-65% and grossly normal diastolic function. No obvious PFO. She was on aspirin 0.81 prior to admission now on aspirin 325 daily. She has risk factors of hypertension, diabetes well controlled advanced age, morbid obesity. She has not had further stroke or TIA symptoms. She had a month of home health physical therapy and occupational therapy. She is now doing her home exercise program. She  has an in-home Aide that helps 4 hours a day 7 days a week, she does require some assistance with bathing and dressing. She ambulates with a walker and denies any recent falls. She returns for reevaluation   REVIEW OF SYSTEMS: Full 14 system review of systems performed and notable only for those listed, all others are neg:  Constitutional: Fatigue  Cardiovascular: neg Ear/Nose/Throat: neg  Skin: neg Eyes: neg Respiratory: neg Gastroitestinal: neg  Hematology/Lymphatic: Easy bruising  Endocrine: neg Musculoskeletal: Walking difficulty, neck pain Allergy/Immunology: neg Neurological: neg Psychiatric: Anxiety  Sleep : neg   ALLERGIES: Allergies  Allergen Reactions  . Buprenorphine Hcl Rash  . Morphine And Related Rash    HOME MEDICATIONS: Outpatient Prescriptions Prior to Visit  Medication Sig Dispense Refill  . acetaminophen (TYLENOL) 500 MG tablet Take 1,000 mg by mouth 2 (two) times daily.     Marland Kitchen albuterol (PROVENTIL HFA;VENTOLIN HFA) 108 (90 BASE) MCG/ACT inhaler Inhale 2 puffs into the lungs every 6 (six) hours as needed for wheezing or shortness of breath.    Marland Kitchen aspirin 325 MG tablet Take 1 tablet (325 mg total) by mouth daily. 30 tablet 0  . Blood Glucose Monitoring Suppl (ONE TOUCH ULTRA 2) W/DEVICE KIT Use to check blood sugar daily dx code 250.00 1 each 0  . DULERA 200-5 MCG/ACT AERO Inhale 2 puffs into the lungs 2 (two) times daily.     . fluticasone (FLONASE) 50 MCG/ACT nasal spray Place 2 sprays into the nose daily. 16 g 2  . gabapentin (NEURONTIN) 100 MG capsule Take 100 mg by mouth 2 (two) times daily.     Marland Kitchen  hydrochlorothiazide (HYDRODIURIL) 25 MG tablet Take 12.5 mg by mouth daily.     . hydrOXYzine (ATARAX/VISTARIL) 25 MG tablet     . metFORMIN (GLUCOPHAGE) 500 MG tablet Take 500 mg by mouth 2 (two) times daily with a meal.    . ONE TOUCH ULTRA TEST test strip USE TO CHECK BLOOD SUGAR DAILY AS DIRECTED 50 each 3  . ONETOUCH DELICA LANCETS 49Q MISC USE AS DIRECTED  TO CHECK BLOOD SUGAR EVERY DAY 100 each 3  . potassium chloride SA (K-DUR,KLOR-CON) 20 MEQ tablet Take 20 mEq by mouth daily.    . sitaGLIPtin (JANUVIA) 100 MG tablet Take 100 mg by mouth daily.    . traZODone (DESYREL) 50 MG tablet Take 50 mg by mouth at bedtime.     . triamcinolone cream (KENALOG) 0.5 % Apply 1 application topically daily as needed (for rash).     . Vitamin D, Ergocalciferol, (DRISDOL) 50000 UNITS CAPS capsule TAKE 1 CAPSULE BY MOUTH TWICE A WEEK (Patient taking differently: TAKE 1 CAPSULE BY MOUTH ONCE WEEKLY ON SATURDAY) 8 capsule 2  . VOLTAREN 1 % GEL Apply 2 g topically 4 (four) times daily as needed (for pain).     . permethrin (ELIMITE) 5 % cream Apply 1 application topically daily as needed (for irritation).      No facility-administered medications prior to visit.    PAST MEDICAL HISTORY: Past Medical History  Diagnosis Date  . Arthritis   . Asthma   . CHF (congestive heart failure) ([place])   . Hypertension   . Diabetes mellitus without complication ([place])   . Chronic headaches   . Constipation   . Hemorrhoids without complication     PAST SURGICAL HISTORY: Past Surgical History  Procedure Laterality Date  . Cholecystectomy    . Back surgery    . Knee surgery    . Abdominal hysterectomy      FAMILY HISTORY: Family History  Problem Relation Age of Onset  . Diabetes Son   . Hypertension Son   . Cancer Sister     breast  . Diabetes Daughter     plus 2 grandkids  . Stroke Son     70moage dx, has enlarged heart  . Heart disease Son     passed away    SOCIAL HISTORY: Social History   Social History  . Marital Status: Single    Spouse Name: N/A  . Number of Children: N/A  . Years of Education: N/A   Occupational History  . Not on file.   Social History Main Topics  . Smoking status: Never Smoker   . Smokeless tobacco: Never Used  . Alcohol Use: No  . Drug Use: No  . Sexual Activity: Not on file   Other Topics Concern  . Not on  file   Social History Narrative   Lives at home by herself, aide in the am.     Has 4 daughters and 3 living sons (one son died).    Uses walker.          PHYSICAL EXAM  Filed Vitals:   08/09/15 1100  BP: 147/66  Pulse: 73  Height: _0  (1.575 m)  Weight: 217 lb 6.4 oz (98.612 kg)   Body mass index is 39.75 kg/(m^2). Generalized: Well developed, morbidly obese female in no acute distress  Head: normocephalic and atraumatic,. Oropharynx benign  Neck: Supple, no carotid bruits  Cardiac: Regular rate rhythm, no murmur  Musculoskeletal: No deformity   Neurological  examination   Mentation: Alert oriented to time, place, history taking. Attention span and concentration appropriate. Recent and remote memory intact. Follows all commands speech and language fluent.   Cranial nerve II-XII: Fundi not visualized Pupils were equal round reactive to light extraocular movements were full, visual field were full on confrontational test. Facial sensation and strength were normal. hearing was intact to finger rubbing bilaterally. Uvula tongue midline. head turning and shoulder shrug were normal and symmetric.Tongue protrusion into cheek strength was normal. Motor: normal bulk and tone, full strength in the BUE, BLE, fine finger movements normal, no pronator drift. Good grip strength bil.  Sensory: normal and symmetric to light touch, pinprick, and Vibration, in the upper and lower extremities Coordination: finger-nose-finger, heel-to-shin bilaterally, no dysmetria Gait and Station: Rising up from seated position without assistance, wide based stance, moderate stride, ambulates with a walker no difficulty with turns     DIAGNOSTIC DATA (LABS, IMAGING, TESTING) - I reviewed patient records, labs, notes, testing and imaging myself where available.  Lab Results  Component Value Date   WBC 6.0 04/07/2015   HGB 11.1* 04/07/2015   HCT 34.9* 04/07/2015   MCV 90.9 04/07/2015   PLT 203  04/07/2015      Component Value Date/Time   NA 140 05/01/2015 0939   K 4.1 05/01/2015 0939   CL 103 05/01/2015 0939   CO2 25 05/01/2015 0939   GLUCOSE 98 05/01/2015 0939   BUN 16 05/01/2015 0939   CREATININE 0.95 05/01/2015 0939   CALCIUM 9.6 05/01/2015 0939   PROT 7.0 03/22/2015 2003   ALBUMIN 3.8 03/22/2015 2003   AST 24 03/22/2015 2003   ALT 11* 03/22/2015 2003   ALKPHOS 62 03/22/2015 2003   BILITOT 0.5 03/22/2015 2003   GFRNONAA >60 04/07/2015 1413   GFRAA >60 04/07/2015 1413   Lab Results  Component Value Date   CHOL 139 03/23/2015   HDL 56 03/23/2015   LDLCALC 70 03/23/2015   TRIG 67 03/23/2015   CHOLHDL 2.5 03/23/2015   Lab Results  Component Value Date   HGBA1C 6.4 05/01/2015       ASSESSMENT AND PLAN 82 y.o. year old female has a past medical history of Arthritis; Asthma; CHF (congestive heart failure) ([place]); Hypertension; Diabetes mellitus without complication ([place]); Chronic headaches; TIA in December 2016, anemia, and cervical disc disorder here to follow-up for recent TIA. She continues to complain of neck pain and left hand pain.The patient is a current patient of Dr. Erlinda Hong  who is out of the office today . This note is sent to the work in doctor.     Plan Management of stroke/ TIA risk factors Keep B/P less than 130 todays reading 120/66 Keep LDL less than 70 at goal no statin necessary at this time Keep HbgA1C less than 6.5 .Most recent 6.4.  Healthy diet low carb low fat Continue home exercise program daily Stay on ASA 325 mg daily for secondary stroke prevention Will check EMG/[place] for left hand pain shooting to neck  F/U in 4 months next with Dr. Mertha Finders, [facility], [facility], [facility] [address] [place],  [address] [phone]

## 2015-08-14 DIAGNOSIS — J4520 Mild intermittent asthma, uncomplicated: Secondary | ICD-10-CM | POA: Diagnosis not present

## 2015-08-14 DIAGNOSIS — I509 Heart failure, unspecified: Secondary | ICD-10-CM | POA: Diagnosis not present

## 2015-08-14 DIAGNOSIS — M179 Osteoarthritis of knee, unspecified: Secondary | ICD-10-CM | POA: Diagnosis not present

## 2015-08-14 DIAGNOSIS — E1165 Type 2 diabetes mellitus with hyperglycemia: Secondary | ICD-10-CM | POA: Diagnosis not present

## 2015-08-14 DIAGNOSIS — I10 Essential (primary) hypertension: Secondary | ICD-10-CM | POA: Diagnosis not present

## 2015-08-14 NOTE — Progress Notes (Signed)
I have reviewed and agreed above plan. 

## 2015-08-15 DIAGNOSIS — E119 Type 2 diabetes mellitus without complications: Secondary | ICD-10-CM | POA: Diagnosis not present

## 2015-08-15 DIAGNOSIS — J4522 Mild intermittent asthma with status asthmaticus: Secondary | ICD-10-CM | POA: Diagnosis not present

## 2015-08-15 DIAGNOSIS — M0540 Rheumatoid myopathy with rheumatoid arthritis of unspecified site: Secondary | ICD-10-CM | POA: Diagnosis not present

## 2015-08-15 DIAGNOSIS — I509 Heart failure, unspecified: Secondary | ICD-10-CM | POA: Diagnosis not present

## 2015-08-16 ENCOUNTER — Other Ambulatory Visit: Payer: Self-pay | Admitting: *Deleted

## 2015-08-16 MED ORDER — ONETOUCH DELICA LANCETS 33G MISC: Status: DC

## 2015-08-25 DIAGNOSIS — I509 Heart failure, unspecified: Secondary | ICD-10-CM | POA: Diagnosis not present

## 2015-08-25 DIAGNOSIS — E119 Type 2 diabetes mellitus without complications: Secondary | ICD-10-CM | POA: Diagnosis not present

## 2015-08-25 DIAGNOSIS — J4522 Mild intermittent asthma with status asthmaticus: Secondary | ICD-10-CM | POA: Diagnosis not present

## 2015-08-25 DIAGNOSIS — M0540 Rheumatoid myopathy with rheumatoid arthritis of unspecified site: Secondary | ICD-10-CM | POA: Diagnosis not present

## 2015-08-29 ENCOUNTER — Other Ambulatory Visit (INDEPENDENT_AMBULATORY_CARE_PROVIDER_SITE_OTHER): Payer: Medicare Other

## 2015-08-29 DIAGNOSIS — Z23 Encounter for immunization: Secondary | ICD-10-CM

## 2015-08-29 LAB — BASIC METABOLIC PANEL
BUN: 16 mg/dL (ref 6–23)
CALCIUM: 9.7 mg/dL (ref 8.4–10.5)
CO2: 28 mEq/L (ref 19–32)
Chloride: 104 mEq/L (ref 96–112)
Creatinine, Ser: 0.97 mg/dL (ref 0.40–1.20)
GFR: 70.2 mL/min (ref >60.00)
GLUCOSE: 114 mg/dL — AB (ref 70–99)
POTASSIUM: 4.3 meq/L (ref 3.5–5.1)
Sodium: 139 mEq/L (ref 135–145)

## 2015-08-29 LAB — HEMOGLOBIN A1C: Hgb A1c MFr Bld: 6.3 % (ref 4.6–6.5)

## 2015-09-01 ENCOUNTER — Encounter: Payer: Self-pay | Admitting: Endocrinology

## 2015-09-01 ENCOUNTER — Ambulatory Visit (INDEPENDENT_AMBULATORY_CARE_PROVIDER_SITE_OTHER): Payer: Medicare Other | Admitting: Endocrinology

## 2015-09-01 VITALS — BP 140/64 | HR 84 | Temp 97.8°F | Ht 62.0 in | Wt 217.0 lb

## 2015-09-01 DIAGNOSIS — E119 Type 2 diabetes mellitus without complications: Secondary | ICD-10-CM | POA: Diagnosis not present

## 2015-09-01 DIAGNOSIS — E559 Vitamin D deficiency, unspecified: Secondary | ICD-10-CM | POA: Diagnosis not present

## 2015-09-01 NOTE — Progress Notes (Signed)
Patient ID: Angelica Benson, female   DOB: 11/30/1928, 82 y.o.   MRN: [id number]   Reason for Appointment : Followup of diabetes and other issues  History of Present Illness          Diagnosis: Type 2 diabetes mellitus, date of diagnosis: 1992       Past history: She is unclear about the date of her diagnosis and treatment history. At onset she had symptoms of frequent urination, weakness and sleepiness. She has been on various oral hypoglycemic drugs since her diagnosis. Has been on metformin since at least 2006 and Januvia since 2007. Also previously had taken glyburide and Avandia Her A1c has generally been in the 5.9-6.4 range previously  Recent history:   She is on Januvia and low dose metformin with fairly consistent control, A1c 6.3, previously 6.4 Checking blood sugars at home less often and mostly before meals but she had difficulty getting her supplies She is usually watching her diet and has lost some weight  Blood sugar levels as below from her One Touch meter:  Fasting blood sugars range from 121-142 100-148  Afternoon and suppertime readings 114-171 with highest reading after eating a banana  Oral hypoglycemic drugs the patient is taking are: Metformin 500 mg twice a day, Januvia 153m      Side effects from medications have been: None  Glucose monitoring:  done irregularly.   Glucometer: One Touch.       Blood Glucose readings from download as above :   Hypoglycemia:   none.  She says her glucose was 71 once when she was late for meal without symptoms  Self-care: The diet that the patient has been following is: tries to limit high-fat foods    Meals: 3 meals per day.  at breakfast has instant oatmeal and toast. At lunch will have sandwich and fruit; dinner is chicken, starch and vegetables; her snacks are fruit, peanut butter or cheese crackers      Exercise:    will walk around her living area, she thinks she is walking about quarter mile now  Dietician  visit: Most recent: 2000            Compliance with the medical regimen: Good  Weight history:  Wt Readings from Last 3 Encounters:  09/01/15 217 lb (98.431 kg)  08/09/15 217 lb 6.4 oz (98.612 kg)  05/11/15 224 lb 9.6 oz (101.878 kg)   Glycemic control:   Lab Results  Component Value Date   HGBA1C 6.3 08/29/2015   HGBA1C 6.4 05/01/2015   HGBA1C 6.5* 03/23/2015   Lab Results  Component Value Date   MICROALBUR 0.8 05/01/2015   LDLCALC 70 03/23/2015   CREATININE 0.97 08/29/2015      Lab on 08/29/2015  Component Date Value Ref Range Status  . Hgb A1c MFr Bld 08/29/2015 6.3  4.6 - 6.5 % Final   Glycemic Control Guidelines for People with Diabetes:Non Diabetic:  <6%Goal of Therapy: <7%Additional Action Suggested:  >8%   . Sodium 08/29/2015 139  135 - 145 mEq/L Final  . Potassium 08/29/2015 4.3  3.5 - 5.1 mEq/L Final  . Chloride 08/29/2015 104  96 - 112 mEq/L Final  . CO2 08/29/2015 28  19 - 32 mEq/L Final  . Glucose, Bld 08/29/2015 114* 70 - 99 mg/dL Final  . BUN 08/29/2015 16  6 - 23 mg/dL Final  . Creatinine, Ser 08/29/2015 0.97  0.40 - 1.20 mg/dL Final  . Calcium 08/29/2015 9.7  8.4 -  10.5 mg/dL Final  . GFR 08/29/2015 70.20  >60.00 mL/min Final      Medication List       This list is accurate as of: 09/01/15 10:17 AM.  Always use your most recent med list.               acetaminophen 500 MG tablet  Commonly known as:  TYLENOL  Take 1,000 mg by mouth 2 (two) times daily.     albuterol 108 (90 Base) MCG/ACT inhaler  Commonly known as:  PROVENTIL HFA;VENTOLIN HFA  Inhale 2 puffs into the lungs every 6 (six) hours as needed for wheezing or shortness of breath.     aspirin 325 MG tablet  Take 1 tablet (325 mg total) by mouth daily.     DULERA 200-5 MCG/ACT Aero  Generic drug:  mometasone-formoterol  Inhale 2 puffs into the lungs 2 (two) times daily.     fluticasone 50 MCG/ACT nasal spray  Commonly known as:  FLONASE  Place 2 sprays into the nose daily.      gabapentin 100 MG capsule  Commonly known as:  NEURONTIN  Take 100 mg by mouth 2 (two) times daily.     hydrochlorothiazide 25 MG tablet  Commonly known as:  HYDRODIURIL  Take 12.5 mg by mouth daily.     hydrOXYzine 25 MG tablet  Commonly known as:  ATARAX/VISTARIL     metFORMIN 500 MG tablet  Commonly known as:  GLUCOPHAGE  Take 500 mg by mouth 2 (two) times daily with a meal.     ONE TOUCH ULTRA 2 w/Device Kit  Use to check blood sugar daily dx code 250.00     ONE TOUCH ULTRA TEST test strip  Generic drug:  glucose blood  USE TO CHECK BLOOD SUGAR DAILY AS DIRECTED     ONETOUCH DELICA LANCETS 37T Misc  USE AS DIRECTED TO CHECK BLOOD SUGAR EVERY DAY Dx code E11.9     potassium chloride SA 20 MEQ tablet  Commonly known as:  K-DUR,KLOR-CON  Take 20 mEq by mouth daily.     sitaGLIPtin 100 MG tablet  Commonly known as:  JANUVIA  Take 100 mg by mouth daily.     traZODone 50 MG tablet  Commonly known as:  DESYREL  Take 50 mg by mouth at bedtime.     triamcinolone cream 0.5 %  Commonly known as:  KENALOG  Apply 1 application topically daily as needed (for rash).     Vitamin D (Ergocalciferol) 50000 units Caps capsule  Commonly known as:  DRISDOL  TAKE 1 CAPSULE BY MOUTH TWICE A WEEK     VOLTAREN 1 % Gel  Generic drug:  diclofenac sodium  Apply 2 g topically 4 (four) times daily as needed (for pain).        Allergies:  Allergies  Allergen Reactions  . Buprenorphine Hcl Rash  . Morphine And Related Rash    Past Medical History  Diagnosis Date  . Arthritis   . Asthma   . CHF (congestive heart failure) ([place])   . Hypertension   . Diabetes mellitus without complication ([place])   . Chronic headaches   . Constipation   . Hemorrhoids without complication     Past Surgical History  Procedure Laterality Date  . Cholecystectomy    . Back surgery    . Knee surgery    . Abdominal hysterectomy      Family History  Problem Relation Age of Onset  . Diabetes  Son   .  Hypertension Son   . Cancer Sister     breast  . Diabetes Daughter     plus 2 grandkids  . Stroke Son     74moage dx, has enlarged heart  . Heart disease Son     passed away    Social History:  reports that she has never smoked. She has never used smokeless tobacco. She reports that she does not drink alcohol or use illicit drugs.    Review of Systems         Lipids: Has not been on treatment with a statin drug, levels are normal without statin drugs  Lab Results  Component Value Date   CHOL 139 03/23/2015   HDL 56 03/23/2015   LDLCALC 70 03/23/2015   TRIG 67 03/23/2015   CHOLHDL 2.5 03/23/2015       The blood pressure has been controlled with HCTZ, followed by PCP       She has had chronic anemia followed by PCP   Lab Results  Component Value Date   WBC 6.0 04/07/2015   HGB 11.1* 04/07/2015   HCT 34.9* 04/07/2015   MCV 90.9 04/07/2015   PLT 203 04/07/2015     No numbness or pains in feet.  On 100 mg gabapentinAt bedtime, taking it  regularly otherwise her legs will ache or tingle     Diabetic foot exam in 8/16 showed spotty decrease in monofilament sensation on some of her toes, inconsistent   She has a history of a small goiter in the past, euthyroid  Lab Results  Component Value Date   TSH 1.62 11/15/2013    Physical Examination:  BP 140/64 mmHg  Pulse 84  Temp(Src) 97.8 F (36.6 C) (Oral)  Ht _0  (1.575 m)  Wt 217 lb (98.431 kg)  BMI 39.68 kg/m2  SpO2 97%  Her right lower thyroid is barely palpable, firm    ASSESSMENT/ PLAN:   Diabetes type 2 with obesity, relatively mild and well controlled with 2 drugs including low dose metformin and Januvia  A1c is excellent at 6.3 considering her age of almost 89and other medical problems Her weight has come down and she is usually consistent with diet She is trying to be active with some walking  Hypertension: Blood pressure well controlled  Vitamin D deficiency: Not clear this has  been followed by PCP, will check her levels on the next visit  Follow-up in 4 months  There are no Patient Instructions on file for this visit.   [place] 09/01/2015, 10:17 AM

## 2015-09-04 ENCOUNTER — Ambulatory Visit (INDEPENDENT_AMBULATORY_CARE_PROVIDER_SITE_OTHER): Payer: Self-pay | Admitting: Neurology

## 2015-09-04 ENCOUNTER — Ambulatory Visit (INDEPENDENT_AMBULATORY_CARE_PROVIDER_SITE_OTHER): Payer: Medicare Other | Admitting: Neurology

## 2015-09-04 DIAGNOSIS — G5603 Carpal tunnel syndrome, bilateral upper limbs: Secondary | ICD-10-CM

## 2015-09-04 DIAGNOSIS — Z0289 Encounter for other administrative examinations: Secondary | ICD-10-CM

## 2015-09-04 DIAGNOSIS — M542 Cervicalgia: Secondary | ICD-10-CM

## 2015-09-04 DIAGNOSIS — G5600 Carpal tunnel syndrome, unspecified upper limb: Secondary | ICD-10-CM | POA: Insufficient documentation

## 2015-09-04 DIAGNOSIS — M79642 Pain in left hand: Secondary | ICD-10-CM

## 2015-09-04 DIAGNOSIS — M79605 Pain in left leg: Secondary | ICD-10-CM

## 2015-09-04 DIAGNOSIS — M5010 Cervical disc disorder with radiculopathy, unspecified cervical region: Secondary | ICD-10-CM

## 2015-09-04 NOTE — Progress Notes (Signed)
   STUDY DATE: 09/04/2015  PATIENT NAME: Angelica Benson  DOB: 1928/11/07 MRN: [id number]   REFERRED BY:  Dennie Bible, NP  HISTORY:  Ms.Chicas is an 82 year old woman with neck pain and left arm pain.   Pain increases when she raises her arm over her head. Pain radiates down the arm to the first 4 fingers.  NERVE CONDUCTION STUDIES:  Bilateral median motor responses had mildly prolonged distal latencies with normal forearm conduction and slightly reduced amplitudes. Bilateral ulnar motor responses had normal distal latencies and conduction velocities and amplitudes. Median and ulnar F-wave responses were normal.   Median sensory responses had delayed distal latencies with normal amplitudes. Ulnar sensory responses were normal.  EMG STUDIES:  EMG of muscles of the left arm were performed. The deltoid and biceps were normal. The triceps had a increase in the number of polyphasic motor units but there was a normal recruitment pattern. The EDC muscle had a mild increase in polyphasic motor units, some that recruited neuropathically.   The FCU muscle had a mild increase in polyphasic motor units, some that recruited neuropathically.    The first dorsal interosseous and the APB muscles are normal.   none of the muscles showed any acute denervation.  IMPRESSION:  This NCV/EMG study shows the following: 1.   Mild chronic C7/C8 radiculopathy on the left.   There were no active features. 2.   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 Felecia Shelling, MD, PhD Certified in Neurology, [place] Neurophysiology, Sleep Medicine, Pain Medicine and Neuroimaging  [facility] [address] [place], [place] [address] [phone]

## 2015-09-05 DIAGNOSIS — M0540 Rheumatoid myopathy with rheumatoid arthritis of unspecified site: Secondary | ICD-10-CM | POA: Diagnosis not present

## 2015-09-05 DIAGNOSIS — I509 Heart failure, unspecified: Secondary | ICD-10-CM | POA: Diagnosis not present

## 2015-09-05 DIAGNOSIS — J4522 Mild intermittent asthma with status asthmaticus: Secondary | ICD-10-CM | POA: Diagnosis not present

## 2015-09-05 DIAGNOSIS — E119 Type 2 diabetes mellitus without complications: Secondary | ICD-10-CM | POA: Diagnosis not present

## 2015-09-12 ENCOUNTER — Telehealth: Payer: Self-pay | Admitting: Nurse Practitioner

## 2015-09-12 NOTE — Telephone Encounter (Signed)
Please let patient that EMG/[place] shows  Mild chronic C7/C8 radiculopathy on the left. There were no active features.  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 She can wear wrist splints at night which is the treatment for this.

## 2015-09-13 NOTE — Telephone Encounter (Signed)
I spoke to pt and relayed the results of [place]/EMG.  Wrist splints to be worn at night.  She verbalized understanding.

## 2015-09-18 DIAGNOSIS — I509 Heart failure, unspecified: Secondary | ICD-10-CM | POA: Diagnosis not present

## 2015-09-18 DIAGNOSIS — E119 Type 2 diabetes mellitus without complications: Secondary | ICD-10-CM | POA: Diagnosis not present

## 2015-09-18 DIAGNOSIS — J4522 Mild intermittent asthma with status asthmaticus: Secondary | ICD-10-CM | POA: Diagnosis not present

## 2015-09-18 DIAGNOSIS — M0540 Rheumatoid myopathy with rheumatoid arthritis of unspecified site: Secondary | ICD-10-CM | POA: Diagnosis not present

## 2015-09-27 DIAGNOSIS — M0540 Rheumatoid myopathy with rheumatoid arthritis of unspecified site: Secondary | ICD-10-CM | POA: Diagnosis not present

## 2015-09-27 DIAGNOSIS — E119 Type 2 diabetes mellitus without complications: Secondary | ICD-10-CM | POA: Diagnosis not present

## 2015-09-27 DIAGNOSIS — I509 Heart failure, unspecified: Secondary | ICD-10-CM | POA: Diagnosis not present

## 2015-09-27 DIAGNOSIS — J4522 Mild intermittent asthma with status asthmaticus: Secondary | ICD-10-CM | POA: Diagnosis not present

## 2015-10-11 DIAGNOSIS — E119 Type 2 diabetes mellitus without complications: Secondary | ICD-10-CM | POA: Diagnosis not present

## 2015-10-11 DIAGNOSIS — I509 Heart failure, unspecified: Secondary | ICD-10-CM | POA: Diagnosis not present

## 2015-10-11 DIAGNOSIS — M0540 Rheumatoid myopathy with rheumatoid arthritis of unspecified site: Secondary | ICD-10-CM | POA: Diagnosis not present

## 2015-10-11 DIAGNOSIS — J4522 Mild intermittent asthma with status asthmaticus: Secondary | ICD-10-CM | POA: Diagnosis not present

## 2015-10-18 ENCOUNTER — Ambulatory Visit (INDEPENDENT_AMBULATORY_CARE_PROVIDER_SITE_OTHER): Payer: Medicare Other | Admitting: Podiatry

## 2015-10-18 ENCOUNTER — Encounter: Payer: Self-pay | Admitting: Podiatry

## 2015-10-18 DIAGNOSIS — M0540 Rheumatoid myopathy with rheumatoid arthritis of unspecified site: Secondary | ICD-10-CM | POA: Diagnosis not present

## 2015-10-18 DIAGNOSIS — I509 Heart failure, unspecified: Secondary | ICD-10-CM | POA: Diagnosis not present

## 2015-10-18 DIAGNOSIS — E119 Type 2 diabetes mellitus without complications: Secondary | ICD-10-CM | POA: Diagnosis not present

## 2015-10-18 DIAGNOSIS — J4522 Mild intermittent asthma with status asthmaticus: Secondary | ICD-10-CM | POA: Diagnosis not present

## 2015-10-18 DIAGNOSIS — M79676 Pain in unspecified toe(s): Secondary | ICD-10-CM | POA: Diagnosis not present

## 2015-10-18 DIAGNOSIS — E1149 Type 2 diabetes mellitus with other diabetic neurological complication: Secondary | ICD-10-CM

## 2015-10-18 DIAGNOSIS — B351 Tinea unguium: Secondary | ICD-10-CM

## 2015-10-18 NOTE — Progress Notes (Signed)
Patient ID: ANNETH BRINKMAN, female   DOB: Oct 07, 1928, 82 y.o.   MRN: [id number] Complaint:  Visit Type: Patient returns to my office for continued preventative foot care services. Complaint: Patient states" my nails have grown long and thick and become painful to walk and wear shoes" Patient has been diagnosed with DM with neuropathy.. The patient presents for preventative foot care services. No changes to ROS  Podiatric Exam: Vascular: dorsalis pedis and posterior tibial pulses are palpable bilateral. Capillary return is immediate. Temperature gradient is WNL. Skin turgor WNL  Sensorium: Normal Semmes Weinstein monofilament test. Normal tactile sensation bilaterally. Nail Exam: Pt has thick disfigured discolored nails with subungual debris noted bilateral entire nail hallux through fifth toenails Ulcer Exam: There is no evidence of ulcer or pre-ulcerative changes or infection. Orthopedic Exam: Muscle tone and strength are WNL. No limitations in general ROM. No crepitus or effusions noted. Foot type and digits show no abnormalities. Bony prominences are unremarkable. Skin: No Porokeratosis. No infection or ulcers  Diagnosis:  Onychomycosis, , Pain in right toe, pain in left toes  Treatment & Plan Procedures and Treatment: Consent by patient was obtained for treatment procedures. The patient understood the discussion of treatment and procedures well. All questions were answered thoroughly reviewed. Debridement of mycotic and hypertrophic toenails, 1 through 5 bilateral and clearing of subungual debris. No ulceration, no infection noted.  Initiate diabetic shoe paperwork. Return Visit-Office Procedure: Patient instructed to return to the office for a follow up visit 3 months for continued evaluation and treatment.  Gardiner Barefoot DPM

## 2015-10-18 NOTE — Addendum Note (Signed)
Addended by: Ezzard Flax, Marsela Kuan L on: 10/18/2015 10:00 AM   Modules accepted: Medications

## 2015-10-24 DIAGNOSIS — J4522 Mild intermittent asthma with status asthmaticus: Secondary | ICD-10-CM | POA: Diagnosis not present

## 2015-10-24 DIAGNOSIS — R32 Unspecified urinary incontinence: Secondary | ICD-10-CM | POA: Diagnosis not present

## 2015-10-24 DIAGNOSIS — M0540 Rheumatoid myopathy with rheumatoid arthritis of unspecified site: Secondary | ICD-10-CM | POA: Diagnosis not present

## 2015-10-24 DIAGNOSIS — E119 Type 2 diabetes mellitus without complications: Secondary | ICD-10-CM | POA: Diagnosis not present

## 2015-10-24 DIAGNOSIS — I509 Heart failure, unspecified: Secondary | ICD-10-CM | POA: Diagnosis not present

## 2015-11-07 DIAGNOSIS — I509 Heart failure, unspecified: Secondary | ICD-10-CM | POA: Diagnosis not present

## 2015-11-07 DIAGNOSIS — J4522 Mild intermittent asthma with status asthmaticus: Secondary | ICD-10-CM | POA: Diagnosis not present

## 2015-11-07 DIAGNOSIS — E119 Type 2 diabetes mellitus without complications: Secondary | ICD-10-CM | POA: Diagnosis not present

## 2015-11-07 DIAGNOSIS — M0540 Rheumatoid myopathy with rheumatoid arthritis of unspecified site: Secondary | ICD-10-CM | POA: Diagnosis not present

## 2015-12-19 ENCOUNTER — Ambulatory Visit: Payer: Medicare Other | Admitting: Neurology

## 2015-12-20 ENCOUNTER — Encounter: Payer: Self-pay | Admitting: Neurology

## 2015-12-27 ENCOUNTER — Other Ambulatory Visit (INDEPENDENT_AMBULATORY_CARE_PROVIDER_SITE_OTHER): Payer: Medicare Other

## 2015-12-27 DIAGNOSIS — E119 Type 2 diabetes mellitus without complications: Secondary | ICD-10-CM | POA: Diagnosis not present

## 2015-12-27 DIAGNOSIS — E559 Vitamin D deficiency, unspecified: Secondary | ICD-10-CM | POA: Diagnosis not present

## 2015-12-27 LAB — COMPREHENSIVE METABOLIC PANEL
ALT: 11 U/L (ref 0–35)
AST: 18 U/L (ref 0–37)
Albumin: 3.6 g/dL (ref 3.5–5.2)
Alkaline Phosphatase: 62 U/L (ref 39–117)
BUN: 21 mg/dL (ref 6–23)
CALCIUM: 9.1 mg/dL (ref 8.4–10.5)
CHLORIDE: 102 meq/L (ref 96–112)
CO2: 30 meq/L (ref 19–32)
Creatinine, Ser: 1.01 mg/dL (ref 0.40–1.20)
GFR: 66.95 mL/min (ref >60.00)
GLUCOSE: 113 mg/dL — AB (ref 70–99)
Potassium: 3.7 mEq/L (ref 3.5–5.1)
Sodium: 139 mEq/L (ref 135–145)
Total Bilirubin: 0.4 mg/dL (ref 0.2–1.2)
Total Protein: 6.9 g/dL (ref 6.0–8.3)

## 2015-12-27 LAB — LIPID PANEL
CHOL/HDL RATIO: 3
Cholesterol: 162 mg/dL (ref 0–200)
HDL: 57.8 mg/dL (ref >39.00)
LDL CALC: 81 mg/dL (ref 0–99)
NONHDL: 104.24
TRIGLYCERIDES: 114 mg/dL (ref 0.0–149.0)
VLDL: 22.8 mg/dL (ref 0.0–40.0)

## 2015-12-27 LAB — MICROALBUMIN / CREATININE URINE RATIO
CREATININE, U: 140.6 mg/dL
Microalb Creat Ratio: 0.5 mg/g (ref 0.0–30.0)
Microalb, Ur: <0.7 mg/dL (ref 0.0–1.9)

## 2015-12-27 LAB — HEMOGLOBIN A1C: HEMOGLOBIN A1C: 6.1 % (ref 4.6–6.5)

## 2015-12-27 LAB — VITAMIN D 25 HYDROXY (VIT D DEFICIENCY, FRACTURES): VITD: 55.75 ng/mL (ref 30.00–100.00)

## 2015-12-28 ENCOUNTER — Other Ambulatory Visit: Payer: Medicare Other

## 2016-01-02 ENCOUNTER — Ambulatory Visit (INDEPENDENT_AMBULATORY_CARE_PROVIDER_SITE_OTHER): Payer: Medicare Other | Admitting: Endocrinology

## 2016-01-02 ENCOUNTER — Encounter: Payer: Self-pay | Admitting: Endocrinology

## 2016-01-02 ENCOUNTER — Other Ambulatory Visit: Payer: Self-pay

## 2016-01-02 ENCOUNTER — Telehealth: Payer: Self-pay | Admitting: Endocrinology

## 2016-01-02 VITALS — BP 118/68 | HR 71 | Temp 97.7°F | Ht 62.0 in | Wt 214.0 lb

## 2016-01-02 DIAGNOSIS — E1165 Type 2 diabetes mellitus with hyperglycemia: Secondary | ICD-10-CM

## 2016-01-02 MED ORDER — GLUCOSE BLOOD VI STRP: ORAL_STRIP | 4 refills | Status: DC

## 2016-01-02 NOTE — Progress Notes (Signed)
Patient ID: Angelica Benson, female   DOB: 02/22/1929, 82 y.o.   MRN: [id number]   Reason for Appointment : Followup of diabetes and other issues  History of Present Illness          Diagnosis: Type 2 diabetes mellitus, date of diagnosis: 1992       Past history: She is unclear about the date of her diagnosis and treatment history. At onset she had symptoms of frequent urination, weakness and sleepiness. She has been on various oral hypoglycemic drugs since her diagnosis. Has been on metformin since at least 2006 and Januvia since 2007. Also previously had taken glyburide and Avandia Her A1c has generally been in the 5.9-6.4 range previously  Recent history:   She is on Januvia and her metformin apparently was stopped by PCP a month ago because of her weight loss  She has had fairly consistent control, A1c 6.1, previously 6.3 She does not know what foods make her sugar go up at night, they are higher than usual  Checking blood sugars at home At various times now including after meals  Her weight loss has improved and she thinks she is sometimes eating too much now She has occasional fried food but not any drinks with sugar, not excessive amounts of rice or pasta or desserts  Oral hypoglycemic drugs the patient is taking are: Metformin 500 mg twice a day, Januvia 123m      Side effects from medications have been: None  Glucose monitoring:  done once a day or less.   Glucometer: One Touch.       Blood Glucose readings from download as below  Mean values apply above for all meters except median for One Touch  PRE-MEAL Fasting Lunch Afternoon  Bedtime Overall  Glucose range: 116-172   96-165  1 97-269    Mean/median: 133     132      Hypoglycemia:   none.   Self-care: The diet that the patient has been following is: tries to limit high-fat foods    Meals: 3 meals per day.  at breakfast has instant oatmeal and toast. At lunch will have sandwich and fruit; dinner is  chicken, starch and vegetables; her snacks are fruit, peanut butter or cheese crackers      Exercise:    will walk around her living area Dietician visit: Most recent: 2000            Compliance with the medical regimen: Good  Weight history:  Wt Readings from Last 3 Encounters:  01/02/16 214 lb (97.1 kg)  09/01/15 217 lb (98.4 kg)  08/09/15 217 lb 6.4 oz (98.6 kg)   Glycemic control:   Lab Results  Component Value Date   HGBA1C 6.1 12/27/2015   HGBA1C 6.3 08/29/2015   HGBA1C 6.4 05/01/2015   Lab Results  Component Value Date   MICROALBUR <0.7 12/27/2015   LDLCALC 81 12/27/2015   CREATININE 1.01 12/27/2015      Lab on 12/27/2015  Component Date Value Ref Range Status  . Hgb A1c MFr Bld 12/27/2015 6.1  4.6 - 6.5 % Final  . Sodium 12/27/2015 139  135 - 145 mEq/L Final  . Potassium 12/27/2015 3.7  3.5 - 5.1 mEq/L Final  . Chloride 12/27/2015 102  96 - 112 mEq/L Final  . CO2 12/27/2015 30  19 - 32 mEq/L Final  . Glucose, Bld 12/27/2015 113* 70 - 99 mg/dL Final  . BUN 12/27/2015 21  6 - 23  mg/dL Final  . Creatinine, Ser 12/27/2015 1.01  0.40 - 1.20 mg/dL Final  . Total Bilirubin 12/27/2015 0.4  0.2 - 1.2 mg/dL Final  . Alkaline Phosphatase 12/27/2015 62  39 - 117 U/L Final  . AST 12/27/2015 18  0 - 37 U/L Final  . ALT 12/27/2015 11  0 - 35 U/L Final  . Total Protein 12/27/2015 6.9  6.0 - 8.3 g/dL Final  . Albumin 12/27/2015 3.6  3.5 - 5.2 g/dL Final  . Calcium 12/27/2015 9.1  8.4 - 10.5 mg/dL Final  . GFR 12/27/2015 66.95  >60.00 mL/min Final  . Cholesterol 12/27/2015 162  0 - 200 mg/dL Final  . Triglycerides 12/27/2015 114.0  0.0 - 149.0 mg/dL Final  . HDL 12/27/2015 57.80  >39.00 mg/dL Final  . VLDL 12/27/2015 22.8  0.0 - 40.0 mg/dL Final  . LDL Cholesterol 12/27/2015 81  0 - 99 mg/dL Final  . Total CHOL/HDL Ratio 12/27/2015 3   Final  . NonHDL 12/27/2015 104.24   Final  . VITD 12/27/2015 55.75  30.00 - 100.00 ng/mL Final  . Microalb, Ur 12/27/2015 <0.7  0.0 -  1.9 mg/dL Final  . Creatinine,U 12/27/2015 140.6  mg/dL Final  . Microalb Creat Ratio 12/27/2015 0.5  0.0 - 30.0 mg/g Final      Medication List       Accurate as of 01/02/16 11:47 AM. Always use your most recent med list.          acetaminophen 500 MG tablet Commonly known as:  TYLENOL Take 1,000 mg by mouth 2 (two) times daily. Reported on 10/18/2015   albuterol 108 (90 Base) MCG/ACT inhaler Commonly known as:  PROVENTIL HFA;VENTOLIN HFA Inhale 2 puffs into the lungs every 6 (six) hours as needed for wheezing or shortness of breath.   aspirin 325 MG tablet Take 1 tablet (325 mg total) by mouth daily.   DULERA 200-5 MCG/ACT Aero Generic drug:  mometasone-formoterol Inhale 2 puffs into the lungs 2 (two) times daily.   fluticasone 50 MCG/ACT nasal spray Commonly known as:  FLONASE Place 2 sprays into the nose daily.   gabapentin 100 MG capsule Commonly known as:  NEURONTIN Take 100 mg by mouth 2 (two) times daily.   hydrochlorothiazide 25 MG tablet Commonly known as:  HYDRODIURIL Take 12.5 mg by mouth daily.   hydrOXYzine 25 MG tablet Commonly known as:  ATARAX/VISTARIL   metFORMIN 500 MG tablet Commonly known as:  GLUCOPHAGE Take 500 mg by mouth 2 (two) times daily with a meal.   ONE TOUCH ULTRA 2 w/Device Kit Use to check blood sugar daily dx code 250.00   ONE TOUCH ULTRA TEST test strip Generic drug:  glucose blood USE TO CHECK BLOOD SUGAR DAILY AS DIRECTED   ONETOUCH DELICA LANCETS 59D Misc USE AS DIRECTED TO CHECK BLOOD SUGAR EVERY DAY Dx code E11.9   potassium chloride SA 20 MEQ tablet Commonly known as:  K-DUR,KLOR-CON Take 20 mEq by mouth daily.   sitaGLIPtin 100 MG tablet Commonly known as:  JANUVIA Take 100 mg by mouth daily.   traZODone 50 MG tablet Commonly known as:  DESYREL Take 50 mg by mouth at bedtime.   triamcinolone cream 0.5 % Commonly known as:  KENALOG Apply 1 application topically daily as needed (for rash).   Vitamin D  (Ergocalciferol) 50000 units Caps capsule Commonly known as:  DRISDOL TAKE 1 CAPSULE BY MOUTH TWICE A WEEK   VOLTAREN 1 % Gel Generic drug:  diclofenac sodium Apply  2 g topically 4 (four) times daily as needed (for pain). Reported on 10/18/2015       Allergies:  Allergies  Allergen Reactions  . Buprenorphine Hcl Rash  . Morphine And Related Rash    Past Medical History:  Diagnosis Date  . Arthritis   . Asthma   . CHF (congestive heart failure) ([place])   . Chronic headaches   . Constipation   . Diabetes mellitus without complication ([place])   . Hemorrhoids without complication   . Hypertension     Past Surgical History:  Procedure Laterality Date  . ABDOMINAL HYSTERECTOMY    . BACK SURGERY    . CHOLECYSTECTOMY    . KNEE SURGERY      Family History  Problem Relation Age of Onset  . Diabetes Son   . Hypertension Son   . Cancer Sister     breast  . Diabetes Daughter     plus 2 grandkids  . Stroke Son     28moage dx, has enlarged heart  . Heart disease Son     passed away    Social History:  reports that she has never smoked. She has never used smokeless tobacco. She reports that she does not drink alcohol or use drugs.    Review of Systems         Lipids: Has not been on treatment with a statin drug, levels are normal without statin drugs  Lab Results  Component Value Date   CHOL 162 12/27/2015   HDL 57.80 12/27/2015   LDLCALC 81 12/27/2015   TRIG 114.0 12/27/2015   CHOLHDL 3 12/27/2015       The blood pressure has been controlled with HCTZ, followed by PCP       She has had chronic anemia followed by PCP   Lab Results  Component Value Date   WBC 6.0 04/07/2015   HGB 11.1 (L) 04/07/2015   HCT 34.9 (L) 04/07/2015   MCV 90.9 04/07/2015   PLT 203 04/07/2015     NEUROPATHY: On 100 mg gabapentin At bedtime, taking it  regularly otherwise her legs will ache or tingle     Diabetic foot exam in 8/16 showed spotty decrease in monofilament sensation  on some of her toes, inconsistent   She has a history of a small goiter in the past, euthyroid  Lab Results  Component Value Date   TSH 1.62 11/15/2013    Physical Examination:  BP 118/68   Pulse 71   Temp 97.7 F (36.5 C)   Ht _0  (1.575 m)   Wt 214 lb (97.1 kg)   SpO2 97%   BMI 39.14 kg/m      ASSESSMENT/ PLAN:   Diabetes type 2 with obesity, relatively mild and well controlled  See history of present illness for detailed discussion of current diabetes management, blood sugar patterns and problems identified  Although her A1c is excellent in the normal range again her blood sugars are recently getting higher This is because of stopping metformin Discussed with the patient that since she had been on metformin for 11 years without any side effects of anorexia she can start back on this with one a day in the evening After one week she can increase back to twice a day  Renal function normal  We can also have her switch to a One Touch Verio meter from her next prescription  Hypertension: Blood pressure well controlled  Vitamin D deficiency: Her level is normal on current  regimen  Has had flu vaccine from PCP  Follow-up in 3 months  Patient Instructions  Start Metformin at dinner 1 pill daily till 9/31 then 2x daily  Check blood sugars on waking up 2-3 x per week  Also check blood sugars about 2 hours after a meal and do this after different meals by rotation  Recommended blood sugar levels on waking up is 90-130 and about 2 hours after meal is 130-160  Please bring your blood sugar monitor to each visit, thank you     [facility] 01/02/2016, 11:47 AM

## 2016-01-02 NOTE — Patient Instructions (Addendum)
Start Metformin at dinner 1 pill daily till 9/31 then 2x daily  Check blood sugars on waking up 2-3 x per week  Also check blood sugars about 2 hours after a meal and do this after different meals by rotation  Recommended blood sugar levels on waking up is 90-130 and about 2 hours after meal is 130-160  Please bring your blood sugar monitor to each visit, thank you

## 2016-01-02 NOTE — Telephone Encounter (Signed)
Please remove the current pharmacy and replace with the [facility] on [address].

## 2016-02-15 ENCOUNTER — Ambulatory Visit: Payer: Medicare Other | Admitting: Podiatry

## 2016-03-14 ENCOUNTER — Observation Stay (HOSPITAL_COMMUNITY)
Admission: EM | Admit: 2016-03-14 | Discharge: 2016-03-15 | Disposition: A | Discharge status: Home / Self Care | Payer: Medicare Other | Attending: Internal Medicine | Admitting: Internal Medicine

## 2016-03-14 ENCOUNTER — Emergency Department (HOSPITAL_COMMUNITY): Payer: Medicare Other

## 2016-03-14 ENCOUNTER — Encounter (HOSPITAL_COMMUNITY): Payer: Self-pay | Admitting: Emergency Medicine

## 2016-03-14 DIAGNOSIS — I10 Essential (primary) hypertension: Secondary | ICD-10-CM | POA: Diagnosis not present

## 2016-03-14 DIAGNOSIS — Z79899 Other long term (current) drug therapy: Secondary | ICD-10-CM | POA: Diagnosis not present

## 2016-03-14 DIAGNOSIS — Z7951 Long term (current) use of inhaled steroids: Secondary | ICD-10-CM | POA: Insufficient documentation

## 2016-03-14 DIAGNOSIS — I159 Secondary hypertension, unspecified: Secondary | ICD-10-CM

## 2016-03-14 DIAGNOSIS — I509 Heart failure, unspecified: Secondary | ICD-10-CM

## 2016-03-14 DIAGNOSIS — J45909 Unspecified asthma, uncomplicated: Secondary | ICD-10-CM | POA: Diagnosis present

## 2016-03-14 DIAGNOSIS — E119 Type 2 diabetes mellitus without complications: Secondary | ICD-10-CM | POA: Diagnosis not present

## 2016-03-14 DIAGNOSIS — R079 Chest pain, unspecified: Secondary | ICD-10-CM | POA: Diagnosis present

## 2016-03-14 DIAGNOSIS — R51 Headache: Secondary | ICD-10-CM | POA: Diagnosis not present

## 2016-03-14 DIAGNOSIS — I4510 Unspecified right bundle-branch block: Secondary | ICD-10-CM | POA: Insufficient documentation

## 2016-03-14 DIAGNOSIS — Z7982 Long term (current) use of aspirin: Secondary | ICD-10-CM | POA: Diagnosis not present

## 2016-03-14 DIAGNOSIS — Z8673 Personal history of transient ischemic attack (TIA), and cerebral infarction without residual deficits: Secondary | ICD-10-CM | POA: Insufficient documentation

## 2016-03-14 DIAGNOSIS — Z7984 Long term (current) use of oral hypoglycemic drugs: Secondary | ICD-10-CM | POA: Diagnosis not present

## 2016-03-14 DIAGNOSIS — R519 Headache, unspecified: Secondary | ICD-10-CM | POA: Diagnosis present

## 2016-03-14 DIAGNOSIS — I110 Hypertensive heart disease with heart failure: Secondary | ICD-10-CM | POA: Diagnosis not present

## 2016-03-14 DIAGNOSIS — I5032 Chronic diastolic (congestive) heart failure: Secondary | ICD-10-CM | POA: Insufficient documentation

## 2016-03-14 DIAGNOSIS — I440 Atrioventricular block, first degree: Secondary | ICD-10-CM | POA: Insufficient documentation

## 2016-03-14 DIAGNOSIS — G8929 Other chronic pain: Secondary | ICD-10-CM | POA: Diagnosis present

## 2016-03-14 DIAGNOSIS — R0789 Other chest pain: Principal | ICD-10-CM | POA: Insufficient documentation

## 2016-03-14 DIAGNOSIS — D649 Anemia, unspecified: Secondary | ICD-10-CM | POA: Diagnosis not present

## 2016-03-14 LAB — CREATININE, SERUM
Creatinine, Ser: 0.87 mg/dL (ref 0.44–1.00)
GFR calc Af Amer: >60 mL/min (ref >60)
GFR calc non Af Amer: 59 mL/min — ABNORMAL LOW (ref >60)

## 2016-03-14 LAB — CBC WITH DIFFERENTIAL/PLATELET
Basophils Absolute: 0 10*3/uL (ref 0.0–0.1)
Basophils Relative: 0 %
Eosinophils Absolute: 0.3 10*3/uL (ref 0.0–0.7)
Eosinophils Relative: 4 %
HEMATOCRIT: 30.7 % — AB (ref 36.0–46.0)
HEMOGLOBIN: 9.9 g/dL — AB (ref 12.0–15.0)
LYMPHS ABS: 2.6 10*3/uL (ref 0.7–4.0)
Lymphocytes Relative: 39 %
MCH: 28.4 pg (ref 26.0–34.0)
MCHC: 32.2 g/dL (ref 30.0–36.0)
MCV: 88 fL (ref 78.0–100.0)
MONOS PCT: 12 %
Monocytes Absolute: 0.8 10*3/uL (ref 0.1–1.0)
NEUTROS ABS: 2.9 10*3/uL (ref 1.7–7.7)
NEUTROS PCT: 45 %
Platelets: 209 10*3/uL (ref 150–400)
RBC: 3.49 MIL/uL — ABNORMAL LOW (ref 3.87–5.11)
RDW: 15.3 % (ref 11.5–15.5)
WBC: 6.6 10*3/uL (ref 4.0–10.5)

## 2016-03-14 LAB — PROTIME-INR
INR: 0.96
Prothrombin Time: 12.8 seconds (ref 11.4–15.2)

## 2016-03-14 LAB — GLUCOSE, CAPILLARY
GLUCOSE-CAPILLARY: 71 mg/dL (ref 65–99)
Glucose-Capillary: 122 mg/dL — ABNORMAL HIGH (ref 65–99)

## 2016-03-14 LAB — CBC
HEMATOCRIT: 33.7 % — AB (ref 36.0–46.0)
Hemoglobin: 10.7 g/dL — ABNORMAL LOW (ref 12.0–15.0)
MCH: 28.2 pg (ref 26.0–34.0)
MCHC: 31.8 g/dL (ref 30.0–36.0)
MCV: 88.7 fL (ref 78.0–100.0)
PLATELETS: 206 10*3/uL (ref 150–400)
RBC: 3.8 MIL/uL — ABNORMAL LOW (ref 3.87–5.11)
RDW: 15.3 % (ref 11.5–15.5)
WBC: 6.3 10*3/uL (ref 4.0–10.5)

## 2016-03-14 LAB — BASIC METABOLIC PANEL
ANION GAP: 7 (ref 5–15)
BUN: 13 mg/dL (ref 6–20)
CHLORIDE: 107 mmol/L (ref 101–111)
CO2: 24 mmol/L (ref 22–32)
Calcium: 9 mg/dL (ref 8.9–10.3)
Creatinine, Ser: 0.92 mg/dL (ref 0.44–1.00)
GFR calc Af Amer: >60 mL/min (ref >60)
GFR, EST NON AFRICAN AMERICAN: 55 mL/min — AB (ref >60)
GLUCOSE: 122 mg/dL — AB (ref 65–99)
POTASSIUM: 3.8 mmol/L (ref 3.5–5.1)
Sodium: 138 mmol/L (ref 135–145)

## 2016-03-14 LAB — CBG MONITORING, ED
Glucose-Capillary: 72 mg/dL (ref 65–99)
Glucose-Capillary: 99 mg/dL (ref 65–99)

## 2016-03-14 LAB — TROPONIN I
Troponin I: <0.03 ng/mL (ref <0.03)
Troponin I: <0.03 ng/mL (ref <0.03)

## 2016-03-14 LAB — I-STAT TROPONIN, ED: Troponin i, poc: 0.01 ng/mL (ref 0.00–0.08)

## 2016-03-14 LAB — POC OCCULT BLOOD, ED: FECAL OCCULT BLD: NEGATIVE

## 2016-03-14 MED ORDER — NITROGLYCERIN 2 % TD OINT
1.0000 [in_us] | TOPICAL_OINTMENT | Freq: Four times a day (QID) | TRANSDERMAL | Status: DC
  Administered 2016-03-14 – 2016-03-15 (×3): 1 [in_us] via TOPICAL
  Filled 2016-03-14: qty 1
  Filled 2016-03-14: qty 30

## 2016-03-14 MED ORDER — ASPIRIN EC 325 MG PO TBEC
325.0000 mg | DELAYED_RELEASE_TABLET | Freq: Every day | ORAL | Status: DC
  Administered 2016-03-14 – 2016-03-15 (×2): 325 mg via ORAL
  Filled 2016-03-14 (×3): qty 1

## 2016-03-14 MED ORDER — FENTANYL CITRATE (PF) 100 MCG/2ML IJ SOLN: 25.0000 ug | INTRAMUSCULAR | Status: DC | PRN

## 2016-03-14 MED ORDER — NITROGLYCERIN 0.4 MG SL SUBL
0.4000 mg | SUBLINGUAL_TABLET | SUBLINGUAL | Status: DC | PRN
  Administered 2016-03-14 (×2): 0.4 mg via SUBLINGUAL
  Filled 2016-03-14: qty 1

## 2016-03-14 MED ORDER — TRAZODONE HCL 50 MG PO TABS
50.0000 mg | ORAL_TABLET | Freq: Every day | ORAL | Status: DC
  Administered 2016-03-14: 50 mg via ORAL
  Filled 2016-03-14: qty 1

## 2016-03-14 MED ORDER — HYDROCHLOROTHIAZIDE 25 MG PO TABS
12.5000 mg | ORAL_TABLET | Freq: Every day | ORAL | Status: DC
  Administered 2016-03-14 – 2016-03-15 (×2): 12.5 mg via ORAL
  Filled 2016-03-14: qty 1
  Filled 2016-03-14: qty 0.5
  Filled 2016-03-14: qty 1

## 2016-03-14 MED ORDER — INSULIN ASPART 100 UNIT/ML ~~LOC~~ SOLN: 0.0000 [IU] | Freq: Three times a day (TID) | SUBCUTANEOUS | Status: DC

## 2016-03-14 MED ORDER — ONDANSETRON HCL 4 MG/2ML IJ SOLN: 4.0000 mg | Freq: Four times a day (QID) | INTRAMUSCULAR | Status: DC | PRN

## 2016-03-14 MED ORDER — HYDRALAZINE HCL 20 MG/ML IJ SOLN: 10.0000 mg | Freq: Three times a day (TID) | INTRAMUSCULAR | Status: DC | PRN

## 2016-03-14 MED ORDER — ENOXAPARIN SODIUM 40 MG/0.4ML ~~LOC~~ SOLN
40.0000 mg | SUBCUTANEOUS | Status: DC
  Filled 2016-03-14: qty 0.4

## 2016-03-14 MED ORDER — POTASSIUM CHLORIDE CRYS ER 20 MEQ PO TBCR
20.0000 meq | EXTENDED_RELEASE_TABLET | Freq: Every day | ORAL | Status: DC
  Administered 2016-03-14 – 2016-03-15 (×2): 20 meq via ORAL
  Filled 2016-03-14 (×3): qty 1

## 2016-03-14 MED ORDER — SODIUM CHLORIDE 0.9 % IV SOLN
INTRAVENOUS | Status: DC
  Administered 2016-03-14: 10:00:00 via INTRAVENOUS

## 2016-03-14 MED ORDER — GABAPENTIN 100 MG PO CAPS
100.0000 mg | ORAL_CAPSULE | Freq: Two times a day (BID) | ORAL | Status: DC
  Administered 2016-03-14 – 2016-03-15 (×3): 100 mg via ORAL
  Filled 2016-03-14 (×4): qty 1

## 2016-03-14 MED ORDER — ACETAMINOPHEN 325 MG PO TABS
650.0000 mg | ORAL_TABLET | ORAL | Status: DC | PRN
  Administered 2016-03-14: 650 mg via ORAL
  Filled 2016-03-14: qty 2

## 2016-03-14 MED ORDER — ALBUTEROL SULFATE (2.5 MG/3ML) 0.083% IN NEBU: 2.5000 mg | INHALATION_SOLUTION | RESPIRATORY_TRACT | Status: DC | PRN

## 2016-03-14 MED ORDER — ENOXAPARIN SODIUM 40 MG/0.4ML ~~LOC~~ SOLN
40.0000 mg | SUBCUTANEOUS | Status: DC
  Administered 2016-03-14: 40 mg via SUBCUTANEOUS
  Filled 2016-03-14: qty 0.4

## 2016-03-14 NOTE — ED Notes (Signed)
Breakfast meal given 

## 2016-03-14 NOTE — Progress Notes (Signed)
This is a no charge note  Pending admssison per Dr. Claudine Mouton  82 year old lady with past medical history of hypertension, diabetes mellitus, CHF, asthma, who presents with chest pain.  Started at 7 PM. Located in the left side of the chest, constant, 8 out of 10 in severity, radiating to the left shoulder and neck. It has subsided to 1 out of 10 in severity after received 1 dose of nitroglycerin. Patient has mild shortness of breath, but no fever or chills. EKG showed old bifascicular block. Patient is accepted to tele bed for observation.  Ivor Costa, MD  [facility] Pager [phone]  If 7PM-7AM, please contact night-coverage [email or web address] Password [email or web address] 03/14/2016, 5:46 AM

## 2016-03-14 NOTE — ED Provider Notes (Signed)
[facility] DEPT Provider Note   CSN: [id number] Arrival date & time: 03/14/16  0431     History   Chief Complaint Chief Complaint  Patient presents with  . Chest Pain    HPI Angelica Benson is a 82 y.o. female.With a past medical history of CHF, diabetes, hypertension presenting today with chest pain. Patient states she had chest pain starting yesterday at 2 PM. Is left-sided and radiates to her neck and shoulder. She denies having pain like this in the past. There is associated shortness of breath. She denies any vomiting or diaphoresis. Patient states that she received nitroglycerin by EMS and this relieved her pain down to a 1 out of 10. Patient states she also did a lot of cooking and preparing holiday cards 2 days ago. She is unsure if this is related to the pain that began today. There are no further complaints.  10 Systems reviewed and are negative for acute change except as noted in the HPI.    HPI  Past Medical History:  Diagnosis Date  . Arthritis   . Asthma   . CHF (congestive heart failure) ([place])   . Chronic headaches   . Constipation   . Diabetes mellitus without complication ([place])   . Hemorrhoids without complication   . Hypertension     Patient Active Problem List   Diagnosis Date Noted  . Chest pain 03/14/2016  . Carpal tunnel syndrome 09/04/2015  . Neck pain 08/09/2015  . Hand pain, left 08/09/2015  . Cervical disc disorder with radiculopathy of cervical region   . Left-sided weakness 03/23/2015  . Normochromic normocytic anemia   . TIA (transient ischemic attack) 03/22/2015  . Obesity, unspecified 08/18/2013  . Goiter 08/18/2013  . Anemia, chronic disease 08/18/2013  . Type 2 diabetes mellitus ([place]) 06/14/2013  . Anemia 06/14/2013  . Type II or unspecified type diabetes mellitus without mention of complication, not stated as uncontrolled 10/31/2010  . HYPERTENSION, BENIGN 04/05/2010  . DIASTOLIC HEART FAILURE, CHRONIC 04/05/2010    Past  Surgical History:  Procedure Laterality Date  . ABDOMINAL HYSTERECTOMY    . BACK SURGERY    . CHOLECYSTECTOMY    . KNEE SURGERY      OB History    No data available       Home Medications    Prior to Admission medications   Medication Sig Start Date End Date Taking? Authorizing Provider  acetaminophen (TYLENOL) 500 MG tablet Take 1,000 mg by mouth 2 (two) times daily. Reported on 10/18/2015    Historical Provider, MD  albuterol (PROVENTIL HFA;VENTOLIN HFA) 108 (90 BASE) MCG/ACT inhaler Inhale 2 puffs into the lungs every 6 (six) hours as needed for wheezing or shortness of breath.    Historical Provider, MD  aspirin 325 MG tablet Take 1 tablet (325 mg total) by mouth daily. 03/25/15   Charlynne Cousins, MD  Blood Glucose Monitoring Suppl (ONE TOUCH ULTRA 2) W/DEVICE KIT Use to check blood sugar daily dx code 250.00 07/07/13   Elayne Snare, MD  DULERA 200-5 MCG/ACT AERO Inhale 2 puffs into the lungs 2 (two) times daily.  07/15/14   Historical Provider, MD  fluticasone (FLONASE) 50 MCG/ACT nasal spray Place 2 sprays into the nose daily. 02/07/13   Blanchie Dessert, MD  gabapentin (NEURONTIN) 100 MG capsule Take 100 mg by mouth 2 (two) times daily.  10/28/13   Historical Provider, MD  glucose blood (ONETOUCH VERIO) test strip Used to check blood sugar once daily 01/02/16  Elayne Snare, MD  hydrochlorothiazide (HYDRODIURIL) 25 MG tablet Take 12.5 mg by mouth daily.     Historical Provider, MD  hydrOXYzine (ATARAX/VISTARIL) 25 MG tablet  04/20/15   Historical Provider, MD  metFORMIN (GLUCOPHAGE) 500 MG tablet Take 500 mg by mouth 2 (two) times daily with a meal.    Historical Provider, MD  [facility] DELICA LANCETS 53G [place] Dx code E11.9 08/16/15   Elayne Snare, MD  potassium chloride SA (K-DUR,KLOR-CON) 20 MEQ tablet Take 20 mEq by mouth daily.    Historical Provider, MD  sitaGLIPtin (JANUVIA) 100 MG tablet Take 100 mg by mouth daily.    Historical Provider,  MD  traZODone (DESYREL) 50 MG tablet Take 50 mg by mouth at bedtime.  11/16/14   Historical Provider, MD  triamcinolone cream (KENALOG) 0.5 % Apply 1 application topically daily as needed (for rash).  03/01/15   Historical Provider, MD  Vitamin D, Ergocalciferol, (DRISDOL) 50000 UNITS CAPS capsule TAKE 1 CAPSULE BY MOUTH TWICE A WEEK Patient taking differently: TAKE 1 CAPSULE BY MOUTH ONCE WEEKLY ON SATURDAY 02/24/14   Elayne Snare, MD  VOLTAREN 1 % GEL Apply 2 g topically 4 (four) times daily as needed (for pain). Reported on 10/18/2015 10/31/14   Historical Provider, MD    Family History Family History  Problem Relation Age of Onset  . Diabetes Son   . Hypertension Son   . Cancer Sister     breast  . Diabetes Daughter     plus 2 grandkids  . Stroke Son     82moage dx, has enlarged heart  . Heart disease Son     passed away    Social History Social History  Substance Use Topics  . Smoking status: Never Smoker  . Smokeless tobacco: Never Used  . Alcohol use No     Allergies   Buprenorphine hcl and Morphine and related   Review of Systems Review of Systems   Physical Exam Updated Vital Signs BP 142/65   Pulse 68   Temp 98.2 F (36.8 C) (Oral)   Resp 24   SpO2 100%   Physical Exam  Constitutional: She is oriented to person, place, and time. She appears well-developed and well-nourished. No distress.  HENT:  Head: Normocephalic and atraumatic.  Nose: Nose normal.  Mouth/Throat: Oropharynx is clear and moist. No oropharyngeal exudate.  Eyes: Conjunctivae and EOM are normal. Pupils are equal, round, and reactive to light. No scleral icterus.  Neck: Normal range of motion. Neck supple. No JVD present. No tracheal deviation present. No thyromegaly present.  Cardiovascular: Normal rate, regular rhythm and normal heart sounds.  Exam reveals no gallop and no friction rub.   No murmur heard. Pulmonary/Chest: Effort normal and breath sounds normal. No respiratory distress.  She has no wheezes. She exhibits no tenderness.  Abdominal: Soft. Bowel sounds are normal. She exhibits no distension and no mass. There is no tenderness. There is no rebound and no guarding.  Musculoskeletal: Normal range of motion. She exhibits no edema or tenderness.  Lymphadenopathy:    She has no cervical adenopathy.  Neurological: She is alert and oriented to person, place, and time. No cranial nerve deficit. She exhibits normal muscle tone.  Skin: Skin is warm and dry. No rash noted. No erythema. No pallor.  Nursing note and vitals reviewed.    ED Treatments / Results  Labs (all labs ordered are listed, but only abnormal results are displayed)  Labs Reviewed  CBC WITH DIFFERENTIAL/PLATELET - Abnormal; Notable for the following:       Result Value   RBC 3.49 (*)    Hemoglobin 9.9 (*)    HCT 30.7 (*)    All other components within normal limits  BASIC METABOLIC PANEL - Abnormal; Notable for the following:    Glucose, Bld 122 (*)    GFR calc non Af Amer 55 (*)    All other components within normal limits  PROTIME-INR  I-STAT TROPOININ, ED  POC OCCULT BLOOD, ED    EKG  EKG Interpretation  Date/Time:  Thursday March 14 2016 04:44:49 EST Ventricular Rate:  63 PR Interval:    QRS Duration: 155 QT Interval:  431 QTC Calculation: 442 R Axis:   -80 Text Interpretation:  Sinus rhythm Prolonged PR interval Wide QRS rhythm RBBB and LAFB No significant change since last tracing Confirmed by Glynn Octave [phone]) on 03/14/2016 4:48:15 AM       Radiology No results found.  Procedures Procedures (including critical care time)  Medications Ordered in ED Medications  nitroGLYCERIN (NITROSTAT) SL tablet 0.4 mg (0.4 mg Sublingual Given 03/14/16 0529)     Initial Impression / Assessment and Plan / ED Course  I have reviewed the triage vital signs and the nursing notes.  Pertinent labs & imaging results that were available during my care of the patient were  reviewed by me and considered in my medical decision making (see chart for details).  Clinical Course     Patient presents emergency department for chest pain. Her history certainly could be consistent with ACS. She also has risk factors including her age and past medical history. I do not believe this is musculoskeletal since she did the cooking 2 days ago. This also was not strenuous activity. Initial troponin is negative, EKG does not show any ischemia. I spoke with the hospitalist who will admit the patient for further care. Patient given aspirin and nitroglycerin in the emergency department.  Final Clinical Impressions(s) / ED Diagnoses   Final diagnoses:  Chest pain, unspecified type    New Prescriptions New Prescriptions   No medications on file     Everlene Balls, MD 03/14/16 [phone]

## 2016-03-14 NOTE — Progress Notes (Signed)
Pt has arrived to 2w from [facility] ED. Telemetry box applied and CCMD notified. Pt oriented to room. Daughter at bedside. Dinner tray ordered. Admissions nurse called. Pt denies pain at this time. Will continue current plan of care.   Grant Fontana BSN, RN

## 2016-03-14 NOTE — ED Triage Notes (Addendum)
Pt transported from [facility] with c/o L sided chest pain radiating to L shoulder onset 1900 last night. Per EMS pt denies n/v/d, denies shob IV est by EMS 22 L FA, Nitro x 1 SL, ASA x 324mg  PO given Pain decreased from 7 to 9.   Pt states yesterday she peeled a large amount of sweet potatoes and wrote out her christmas cards. Pt states she began having soreness and discomfort to L shoulder @ 1400 yesterday.

## 2016-03-14 NOTE — ED Notes (Signed)
Pt in x-ray at this time

## 2016-03-14 NOTE — H&P (Addendum)
[facility] History and Physical  Angelica Benson:[id number] DOB: 29-Nov-1928 DOA: 03/14/2016  Referring physician:  PCP: Philis Fendt, MD   Chief Complaint: "My chest started hurting  here on the left."  HPI: Angelica Benson is a 82 y.o. female with past oral history of asthma, congestive heart failure, arthritis, chronic headaches, diabetes, hypertension, goiter, transient ischemic attack, diabetes and anemia presents with complaint of chest pain. Patient denies any chest trauma. No pelvic cyst history of myocardial infarction. No recent upper respiratory tract infection or coughing. Patient states that acutely and 2 PM that the heart admission while at rest she developed chest pressure. Discomfort radiated to the left shoulder left on left neck. Patient also had some shortness of breath with this. Patient denies any nausea vomiting diaphoresis. Patient called EMS who administered nitroglycerin and aspirin which helped with the patient's chest pain brought it down from severe to a 1.   No chest pain with deep inspiration.  ED course: EKG without acute ST changes. Initial troponin negative. Ntgring given with reduction in chest pain. Chest pain currently 1 out of 10. Chest x-ray negative.   Review of Systems:  As per HPI otherwise 10 point review of systems negative.    Past Medical History:  Diagnosis Date  . Arthritis   . Asthma   . CHF (congestive heart failure) ([place])   . Chronic headaches   . Constipation   . Diabetes mellitus without complication ([place])   . Hemorrhoids without complication   . Hypertension    Past Surgical History:  Procedure Laterality Date  . ABDOMINAL HYSTERECTOMY    . BACK SURGERY    . CHOLECYSTECTOMY    . KNEE SURGERY     Social History:  reports that she has never smoked. She has never used smokeless tobacco. She reports that she does not drink alcohol or use drugs.  Allergies  Allergen Reactions  . Buprenorphine Hcl Rash  .  Morphine And Related Rash    Family History  Problem Relation Age of Onset  . Diabetes Son   . Hypertension Son   . Cancer Sister     breast  . Diabetes Daughter     plus 2 grandkids  . Stroke Son     31moage dx, has enlarged heart  . Heart disease Son     passed away     Prior to Admission medications   Medication Sig Start Date End Date Taking? Authorizing Provider  aspirin EC 81 MG tablet Take 81 mg by mouth daily.   Yes Historical Provider, MD  gabapentin (NEURONTIN) 100 MG capsule Take 100 mg by mouth 2 (two) times daily.  10/28/13  Yes Historical Provider, MD  hydrochlorothiazide (HYDRODIURIL) 25 MG tablet Take 12.5 mg by mouth daily.    Yes Historical Provider, MD  hydrOXYzine (ATARAX/VISTARIL) 25 MG tablet Take 25 mg by mouth at bedtime as needed.  04/20/15  Yes Historical Provider, MD  metFORMIN (GLUCOPHAGE) 500 MG tablet Take 500 mg by mouth 2 (two) times daily with a meal.   Yes Historical Provider, MD  potassium chloride SA (K-DUR,KLOR-CON) 20 MEQ tablet Take 20 mEq by mouth daily.   Yes Historical Provider, MD  sitaGLIPtin (JANUVIA) 100 MG tablet Take 100 mg by mouth daily.   Yes Historical Provider, MD  traZODone (DESYREL) 50 MG tablet Take 50 mg by mouth at bedtime.  11/16/14  Yes Historical Provider, MD  Vitamin D, Ergocalciferol, (DRISDOL) 50000 UNITS CAPS capsule TAKE 1 CAPSULE  BY MOUTH TWICE A WEEK Patient taking differently: TAKE 1 CAPSULE BY MOUTH ONCE WEEKLY ON SATURDAY 02/24/14  Yes Elayne Snare, MD  aspirin 325 MG tablet Take 1 tablet (325 mg total) by mouth daily. Patient not taking: Reported on 03/14/2016 03/25/15   Charlynne Cousins, MD  Blood Glucose Monitoring Suppl (ONE TOUCH ULTRA 2) W/DEVICE KIT Use to check blood sugar daily dx code 250.00 07/07/13   Elayne Snare, MD  fluticasone [facility]) 50 MCG/ACT nasal spray Place 2 sprays into the nose daily. Patient not taking: Reported on 03/14/2016 02/07/13   Blanchie Dessert, MD  glucose blood (ONETOUCH VERIO) test  strip Used to check blood sugar once daily 01/02/16   Elayne Snare, MD  [facility] DELICA LANCETS 44I MISC USE AS DIRECTED TO CHECK BLOOD SUGAR EVERY DAY Dx code E11.9 08/16/15   Elayne Snare, MD   Physical Exam: Vitals:   03/14/16 0527 03/14/16 0545 03/14/16 0630 03/14/16 0645  BP: 142/65 (!) 134/51 (!) 110/47 116/75  Pulse: 68 (!) 56 (!) 58 62  Resp: 24 17 15 17   Temp:      TempSrc:      SpO2: 100% 99% 98% 100%    Wt Readings from Last 3 Encounters:  01/02/16 97.1 kg (214 lb)  09/01/15 98.4 kg (217 lb)  08/09/15 98.6 kg (217 lb 6.4 oz)    General:  Appears calm and comfortable Eyes:  PERRL, EOMI, normal lids, iris ENT:  grossly normal hearing, lips & tongue Neck:  no LAD, masses or thyromegaly Cardiovascular:  RRR, no m/r/g. No LE edema. Chest wall tenderness on the left. Respiratory:  CTA bilaterally, no w/r/r. Normal respiratory effort. Abdomen:  soft, ntnd Skin:  no rash or induration seen on limited exam Musculoskeletal:  grossly normal tone BUE/BLE Psychiatric:  grossly normal mood and affect, speech fluent and appropriate Neurologic:  CN 2-12 grossly intact, moves all extremities in coordinated fashion.          Labs on Admission:  Basic Metabolic Panel:  Recent Labs Lab 03/14/16 0440  NA 138  K 3.8  CL 107  CO2 24  GLUCOSE 122*  BUN 13  CREATININE 0.92  CALCIUM 9.0   Liver Function Tests: No results for input(s): AST, ALT, ALKPHOS, BILITOT, PROT, ALBUMIN in the last 168 hours. No results for input(s): LIPASE, AMYLASE in the last 168 hours. No results for input(s): AMMONIA in the last 168 hours. CBC:  Recent Labs Lab 03/14/16 0440  WBC 6.6  NEUTROABS 2.9  HGB 9.9*  HCT 30.7*  MCV 88.0  PLT 209   Cardiac Enzymes: No results for input(s): CKTOTAL, CKMB, CKMBINDEX, TROPONINI in the last 168 hours.  BNP (last 3 results) No results for input(s): BNP in the last 8760 hours.  ProBNP (last 3 results) No results for input(s): PROBNP in the last 8760  hours.   Creatinine clearance cannot be calculated (Unknown ideal weight.)  CBG: No results for input(s): GLUCAP in the last 168 hours.  Radiological Exams on Admission: Dg Chest 2 View  Result Date: 03/14/2016 CLINICAL DATA:  Left-sided chest pain EXAM: CHEST  2 VIEW COMPARISON:  Chest radiograph 03/23/2015 FINDINGS: Cardiomediastinal contours are normal. No focal airspace consolidation or pulmonary edema. No pleural effusion or pneumothorax. Bilateral glenohumeral and acromioclavicular osteoarthrosis. IMPRESSION: No active cardiopulmonary disease. Electronically Signed   By: Ulyses Jarred M.D.   On: 03/14/2016 06:09    EKG: Independently reviewed. Ventricular rate 63, PR interval 300, curettes duration 135, QTC 442, bifascicular block-no significant  ST changes when compared to December 2016 EKG   Assessment/Plan Principal Problem:   Chest pain Active Problems:   HYPERTENSION, BENIGN   Type 2 diabetes mellitus (HCC)   Anemia   Chronic headaches   CHF (congestive heart failure) (HCC)   Asthma   CP Given hear score of 5 in ED. - serial trop ordered, initial neg - prn EKG CP - prn moprhine CP - prn ntg cp - nitropaste ordered - asa in ED and QD - ECHO ordered for AM - tele bed, cardiac monitoring - trazodone qhs - zofran prn for nausea  Hypertension When necessary hydralazine 10 mg IV as needed for severe blood pressure Cont HCTZ & Kdur  DM SSI Hold Metformin & Januvia  Chronic HA Cont gabapentin  CHF Monitor fluid status  Asthma Prn albuterol  Anemia No active bleed, no sx Hgb 9.9 on admit Chronic issue, will monitor  Code Status: FULL  DVT Prophylaxis: Lovenox Family Communication: none available, pt wants daughter Vita Barley to be her surrogate decision maker Disposition Plan: Pending Improvement    Elwin Mocha, MD Family Medicine [facility] [email or web address] Password [email or web address]

## 2016-03-15 ENCOUNTER — Encounter (HOSPITAL_COMMUNITY): Payer: Self-pay | Admitting: Cardiology

## 2016-03-15 ENCOUNTER — Observation Stay (HOSPITAL_COMMUNITY): Payer: Medicare Other

## 2016-03-15 DIAGNOSIS — R072 Precordial pain: Secondary | ICD-10-CM

## 2016-03-15 DIAGNOSIS — R0789 Other chest pain: Secondary | ICD-10-CM | POA: Diagnosis not present

## 2016-03-15 DIAGNOSIS — R079 Chest pain, unspecified: Secondary | ICD-10-CM | POA: Diagnosis not present

## 2016-03-15 LAB — GLUCOSE, CAPILLARY
GLUCOSE-CAPILLARY: 88 mg/dL (ref 65–99)
Glucose-Capillary: 117 mg/dL — ABNORMAL HIGH (ref 65–99)

## 2016-03-15 MED ORDER — NITROGLYCERIN 0.4 MG SL SUBL: 0.4000 mg | SUBLINGUAL_TABLET | SUBLINGUAL | 0 refills | Status: DC | PRN

## 2016-03-15 NOTE — Discharge Summary (Signed)
[name]:[id number] DOB: 19-Apr-1928 DOA: 03/14/2016  PCP: Philis Fendt, MD  Admit date: 03/14/2016  Discharge date: 03/15/2016  Admitted From: Home   Disposition:  Home   Recommendations for Outpatient Follow-up:   Follow up with PCP in 1-2 weeks  PCP Please obtain BMP/CBC, 2 view CXR in 1week,  (see Discharge instructions)   PCP Please follow up on the following pending results: None   Home Health: None   Equipment/Devices: None  Consultations: Cards Discharge Condition: Stable   CODE STATUS: Full   Diet Recommendation: Diet Carb Modified  Heart Healthy     Chief Complaint  Patient presents with  . Chest Pain     Brief history of present illness from the day of admission and additional interim summary    Angelica Benson is a 82 y.o. female with past oral history of asthma, congestive heart failure, arthritis, chronic headaches, diabetes, hypertension, goiter, transient ischemic attack, diabetes and anemia presents with complaint of chest pain. Patient denies any chest trauma. No pelvic cyst history of myocardial infarction. No recent upper respiratory tract infection or coughing. Patient states that acutely and 2 PM that the heart admission while at rest she developed chest pressure. Discomfort radiated to the left shoulder left on left neck. Patient also had some shortness of breath with this. Patient denies any nausea vomiting diaphoresis. Patient called EMS who administered nitroglycerin and aspirin which helped with the patient's chest pain brought it down from severe to a 1.  No chest pain with deep inspiration.  ED course: EKG without acute ST changes. Initial troponin negative. Ntgring given with reduction in chest pain. Chest pain currently 1 out of 10. Chest x-ray negative.  Hospital  issues addressed     1.Atypical chest pain. Seen by cardiology, EKG nonacute with left axis deviation and right bundle branch block along with first-degree AV block, no acute ST changes, troponin negative 3 sets, seen by cardiology cleared for home discharge without any further cardiac workup. Will be followed with PCP for secondary risk factor modification.  2. HTN - continue home regimen.  3. History of asthma. Stable no acute issues.  4. Chronic diastolic CHF EF 35%. Compensated. Continue home regimen.    Discharge diagnosis     Principal Problem:   Chest pain Active Problems:   HYPERTENSION, BENIGN   Type 2 diabetes mellitus (HCC)   Anemia   Chronic headaches   CHF (congestive heart failure) ([place])   Asthma    Discharge instructions    Discharge Instructions    Discharge instructions    Complete by:  As directed    Follow with Primary MD Philis Fendt, MD in 7 days   Get CBC, CMP, 2 view Chest X ray checked  by Primary MD or SNF MD in 5-7 days ( we routinely change or add medications that can affect your baseline labs and fluid status, therefore we recommend that you get the mentioned basic workup next visit with your PCP, your PCP may decide  not to get them or add new tests based on their clinical decision)   Activity: As tolerated with Full fall precautions use walker/cane & assistance as needed   Disposition Home     Diet:   Diet Carb Modified Heart Healthy    For Heart failure patients - Check your Weight same time everyday, if you gain over 2 pounds, or you develop in leg swelling, experience more shortness of breath or chest pain, call your Primary MD immediately. Follow Cardiac Low Salt Diet and 1.5 lit/day fluid restriction.   On your next visit with your primary care physician please Get Medicines reviewed and adjusted.   Please request your Prim.MD to go over all Hospital Tests and Procedure/Radiological results at the follow up, please get all  Hospital records sent to your Prim MD by signing hospital release before you go home.   If you experience worsening of your admission symptoms, develop shortness of breath, life threatening emergency, suicidal or homicidal thoughts you must seek medical attention immediately by calling 911 or calling your MD immediately  if symptoms less severe.  You Must read complete instructions/literature along with all the possible adverse reactions/side effects for all the Medicines you take and that have been prescribed to you. Take any new Medicines after you have completely understood and accpet all the possible adverse reactions/side effects.   Do not drive, operate heavy machinery, perform activities at heights, swimming or participation in water activities or provide baby sitting services if your were admitted for syncope or siezures until you have seen by Primary MD or a Neurologist and advised to do so again.  Do not drive when taking Pain medications.    Do not take more than prescribed Pain, Sleep and Anxiety Medications  Special Instructions: If you have smoked or chewed Tobacco  in the last 2 yrs please stop smoking, stop any regular Alcohol  and or any Recreational drug use.  Wear Seat belts while driving.   Please note  You were cared for by a hospitalist during your hospital stay. If you have any questions about your discharge medications or the care you received while you were in the hospital after you are discharged, you can call the unit and asked to speak with the hospitalist on call if the hospitalist that took care of you is not available. Once you are discharged, your primary care physician will handle any further medical issues. Please note that NO REFILLS for any discharge medications will be authorized once you are discharged, as it is imperative that you return to your primary care physician (or establish a relationship with a primary care physician if you do not have one) for  your aftercare needs so that they can reassess your need for medications and monitor your lab values.   Increase activity slowly    Complete by:  As directed       Discharge Medications     Medication List    TAKE these medications   aspirin EC 81 MG tablet Take 81 mg by mouth daily.   aspirin 325 MG tablet Take 1 tablet (325 mg total) by mouth daily.   gabapentin 100 MG capsule Commonly known as:  NEURONTIN Take 100 mg by mouth 2 (two) times daily.   glucose blood test strip Commonly known as:  ONETOUCH VERIO Used to check blood sugar once daily   hydrochlorothiazide 25 MG tablet Commonly known as:  HYDRODIURIL Take 12.5 mg by mouth daily.   hydrOXYzine 25 MG  tablet Commonly known as:  ATARAX/VISTARIL Take 25 mg by mouth at bedtime as needed.   metFORMIN 500 MG tablet Commonly known as:  GLUCOPHAGE Take 500 mg by mouth 2 (two) times daily with a meal.   nitroGLYCERIN 0.4 MG SL tablet Commonly known as:  NITROSTAT Place 1 tablet (0.4 mg total) under the tongue every 5 (five) minutes as needed for chest pain.   ONE TOUCH ULTRA 2 w/Device Kit Use to check blood sugar daily dx code 678.93   ONETOUCH DELICA LANCETS 81O Misc USE AS DIRECTED TO CHECK BLOOD SUGAR EVERY DAY Dx code E11.9   potassium chloride SA 20 MEQ tablet Commonly known as:  K-DUR,KLOR-CON Take 20 mEq by mouth daily.   sitaGLIPtin 100 MG tablet Commonly known as:  JANUVIA Take 100 mg by mouth daily.   traZODone 50 MG tablet Commonly known as:  DESYREL Take 50 mg by mouth at bedtime.   Vitamin D (Ergocalciferol) 50000 units Caps capsule Commonly known as:  DRISDOL TAKE 1 CAPSULE BY MOUTH TWICE A WEEK What changed:  See the new instructions.       Follow-up Information    Philis Fendt, MD. Schedule an appointment as soon as possible for a visit in 1 week(s).   Specialty:  Internal Medicine Contact information: [place] [address] [phone]        Kirk Ruths, MD. Schedule an appointment as soon as possible for a visit in 1 week(s).   Specialty:  Cardiology Contact information: [address] [place] [address] [phone]           Major procedures and Radiology Reports - PLEASE review detailed and final reports thoroughly  -       Dg Chest 2 View  Result Date: 03/14/2016 CLINICAL DATA:  Left-sided chest pain EXAM: CHEST  2 VIEW COMPARISON:  Chest radiograph 03/23/2015 FINDINGS: Cardiomediastinal contours are normal. No focal airspace consolidation or pulmonary edema. No pleural effusion or pneumothorax. Bilateral glenohumeral and acromioclavicular osteoarthrosis. IMPRESSION: No active cardiopulmonary disease. Electronically Signed   By: Ulyses Jarred M.D.   On: 03/14/2016 06:09    Micro Results     No results found for this or any previous visit (from the past 240 hour(s)).  Today   Subjective    Angelica Benson today has no headache,no chest abdominal pain,no new weakness tingling or numbness, feels much better wants to go home today.    Objective   Blood pressure (!) 132/47, pulse 61, temperature 98.1 F (36.7 C), temperature source Oral, resp. rate 18, weight 96.2 kg (212 lb), SpO2 99 %.   Intake/Output Summary (Last 24 hours) at 03/15/16 1137 Last data filed at 03/15/16 0830  Gross per 24 hour  Intake                0 ml  Output                0 ml  Net                0 ml    Exam Awake Alert, Oriented x 3, No new F.N deficits, Normal affect [place].AT,PERRAL Supple Neck,No JVD, No cervical lymphadenopathy appriciated.  Symmetrical Chest wall movement, Good air movement bilaterally, CTAB RRR,No Gallops,Rubs or new Murmurs, No Parasternal Heave +ve B.Sounds, Abd Soft, Non tender, No organomegaly appriciated, No rebound -guarding or rigidity. No Cyanosis, Clubbing or edema, No new Rash or bruise   Data Review   CBC  w Diff: Lab Results  Component Value Date   WBC 6.3 03/14/2016   HGB 10.7  (L) 03/14/2016   HCT 33.7 (L) 03/14/2016   PLT 206 03/14/2016   LYMPHOPCT 39 03/14/2016   MONOPCT 12 03/14/2016   EOSPCT 4 03/14/2016   BASOPCT 0 03/14/2016    CMP: Lab Results  Component Value Date   NA 138 03/14/2016   K 3.8 03/14/2016   CL 107 03/14/2016   CO2 24 03/14/2016   BUN 13 03/14/2016   CREATININE 0.87 03/14/2016   PROT 6.9 12/27/2015   ALBUMIN 3.6 12/27/2015   BILITOT 0.4 12/27/2015   ALKPHOS 62 12/27/2015   AST 18 12/27/2015   ALT 11 12/27/2015  .   Total Time in preparing paper work, data evaluation and todays exam - 35 minutes  Thurnell Lose M.D on 03/15/2016 at 11:37 AM  [facility]   Office  [phone]

## 2016-03-15 NOTE — Care Management Note (Signed)
Case Management Note  Patient Details  Name: Angelica Benson MRN: [id number] Date of Birth: [birth year]  Subjective/Objective:       Chest pain             Action/Plan: Discharge Planning: NCM spoke to pt. States she lives in the home alone. She has a aide that comes 7 days a week for 3 hours a day from [place]. She had [facility] in the past. Has RW, Rollator, bedside commode, and lift chair. Will continue to follow for dc needs.    PCP Nolene Ebbs MD   Expected Discharge Date:                 Expected Discharge Plan:  Home/Self Care  In-House Referral:  NA  Discharge planning Services  CM Consult  Post Acute Care Choice:  NA Choice offered to:  NA  DME Arranged:  N/A DME Agency:  NA  HH Arranged:  NA HH Agency:  NA  Status of Service:  In process, will continue to follow  If discussed at Long Length of Stay Meetings, dates discussed:    Additional Comments:  Erenest Rasher, RN 03/15/2016, 9:46 AM

## 2016-03-15 NOTE — Consult Note (Signed)
Reason for Consult: chest pain   Referring Physician: Dr. Candiss Norse   PCP:  Philis Fendt, MD  Primary Cardiologist:new Dr. Shelby Mattocks is an 82 y.o. female.    Chief Complaint: admitted 03/14/16 for chest pain   HPI:  38 yof with hx of asthma, CHF, TIA, DM, and anemia developed chest pain [facility] after peeling sweet potatoes and writing Christmas cards.  Described as sharp, dull and pressure terrible pain Lt ant chest and into Lt shoulder Lt neck lt back to top of her head.  No prior cardiac hx.   She also has SOB. No N,V, diaphoresis.  EMS gave NTG and ASA and pain decreased from severe to 1.  No pain currently.  She is active with her walker.    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   No acute changes and RBBB is old.   troponins are negative.   Previous Echo 01/2015 with mild LVH and LVEF of 60-65%. PA Pk pressure 33 mmHg.   Past Medical History:  Diagnosis Date  . Arthritis   . Asthma   . CHF (congestive heart failure) ([place])   . Chronic headaches   . Constipation   . Diabetes mellitus without complication ([place])   . Hemorrhoids without complication   . Hypertension     Past Surgical History:  Procedure Laterality Date  . ABDOMINAL HYSTERECTOMY    . BACK SURGERY    . CHOLECYSTECTOMY    . KNEE SURGERY      Family History  Problem Relation Age of Onset  . Diabetes Son   . Hypertension Son   . Cancer Sister     breast  . Diabetes Daughter     plus 2 grandkids  . Stroke Son     28moage dx, has enlarged heart  . Heart disease Son     passed away   Social History:  reports that she has never smoked. She has never used smokeless tobacco. She reports that she does not drink alcohol or use drugs.  Allergies:  Allergies  Allergen Reactions  . Buprenorphine Hcl Rash  . Morphine And Related Rash   OUTPATIENT MEDICATIONS: No current facility-administered medications on file prior to encounter.    Current  Outpatient Prescriptions on File Prior to Encounter  Medication Sig Dispense Refill  . gabapentin (NEURONTIN) 100 MG capsule Take 100 mg by mouth 2 (two) times daily.     . hydrochlorothiazide (HYDRODIURIL) 25 MG tablet Take 12.5 mg by mouth daily.     . hydrOXYzine (ATARAX/VISTARIL) 25 MG tablet Take 25 mg by mouth at bedtime as needed.     . metFORMIN (GLUCOPHAGE) 500 MG tablet Take 500 mg by mouth 2 (two) times daily with a meal.    . potassium chloride SA (K-DUR,KLOR-CON) 20 MEQ tablet Take 20 mEq by mouth daily.    . sitaGLIPtin (JANUVIA) 100 MG tablet Take 100 mg by mouth daily.    . traZODone (DESYREL) 50 MG tablet Take 50 mg by mouth at bedtime.     . Vitamin D, Ergocalciferol, (DRISDOL) 50000 UNITS CAPS capsule TAKE 1 CAPSULE BY MOUTH TWICE A WEEK (Patient taking differently: TAKE 1 CAPSULE BY MOUTH ONCE WEEKLY ON SATURDAY) 8 capsule 2  . aspirin 325 MG tablet Take 1 tablet (325 mg total) by mouth daily. (Patient not taking: Reported on 03/14/2016) 30 tablet 0  . Blood Glucose Monitoring  Suppl (ONE TOUCH ULTRA 2) W/DEVICE KIT Use to check blood sugar daily dx code 250.00 1 each 0  . glucose blood (ONETOUCH VERIO) test strip Used to check blood sugar once daily 100 each 4  . ONETOUCH DELICA LANCETS 97L MISC USE AS DIRECTED TO CHECK BLOOD SUGAR EVERY DAY Dx code E11.9 100 each 1   CURRENT MEDICATIONS: Scheduled Meds: . aspirin EC  325 mg Oral Daily  . enoxaparin (LOVENOX) injection  40 mg Subcutaneous Q24H  . gabapentin  100 mg Oral BID  . hydrochlorothiazide  12.5 mg Oral Daily  . insulin aspart  0-20 Units Subcutaneous TID WC  . nitroGLYCERIN  1 inch Topical Q6H  . potassium chloride SA  20 mEq Oral Daily  . traZODone  50 mg Oral QHS   Continuous Infusions: . sodium chloride 10 mL/hr at 03/14/16 0953   PRN Meds:.acetaminophen, albuterol, fentaNYL (SUBLIMAZE) injection, hydrALAZINE, ondansetron (ZOFRAN) IV   Results for orders placed or performed during the hospital encounter  of 03/14/16 (from the past 48 hour(s))  CBC with Differential/Platelet     Status: Abnormal   Collection Time: 03/14/16  4:40 AM  Result Value Ref Range   WBC 6.6 4.0 - 10.5 K/uL   RBC 3.49 (L) 3.87 - 5.11 MIL/uL   Hemoglobin 9.9 (L) 12.0 - 15.0 g/dL   HCT 30.7 (L) 36.0 - 46.0 %   MCV 88.0 78.0 - 100.0 fL   MCH 28.4 26.0 - 34.0 pg   MCHC 32.2 30.0 - 36.0 g/dL   RDW 15.3 11.5 - 15.5 %   Platelets 209 150 - 400 K/uL   Neutrophils Relative % 45 %   Neutro Abs 2.9 1.7 - 7.7 K/uL   Lymphocytes Relative 39 %   Lymphs Abs 2.6 0.7 - 4.0 K/uL   Monocytes Relative 12 %   Monocytes Absolute 0.8 0.1 - 1.0 K/uL   Eosinophils Relative 4 %   Eosinophils Absolute 0.3 0.0 - 0.7 K/uL   Basophils Relative 0 %   Basophils Absolute 0.0 0.0 - 0.1 K/uL  Basic metabolic panel     Status: Abnormal   Collection Time: 03/14/16  4:40 AM  Result Value Ref Range   Sodium 138 135 - 145 mmol/L   Potassium 3.8 3.5 - 5.1 mmol/L   Chloride 107 101 - 111 mmol/L   CO2 24 22 - 32 mmol/L   Glucose, Bld 122 (H) 65 - 99 mg/dL   BUN 13 6 - 20 mg/dL   Creatinine, Ser 0.92 0.44 - 1.00 mg/dL   Calcium 9.0 8.9 - 10.3 mg/dL   GFR calc non Af Amer 55 (L) >60 mL/min   GFR calc Af Amer >60 >60 mL/min    Comment: (NOTE) The eGFR has been calculated using the CKD EPI equation. This calculation has not been validated in all clinical situations. eGFR's persistently <60 mL/min signify possible Chronic Kidney Disease.    Anion gap 7 5 - 15  Protime-INR     Status: None   Collection Time: 03/14/16  4:40 AM  Result Value Ref Range   Prothrombin Time 12.8 11.4 - 15.2 seconds   INR 0.96   I-stat troponin, ED     Status: None   Collection Time: 03/14/16  4:46 AM  Result Value Ref Range   Troponin i, poc 0.01 0.00 - 0.08 ng/mL   Comment 3            Comment: Due to the release kinetics of cTnI, a negative result  within the first hours of the onset of symptoms does not rule out myocardial infarction with certainty. If  myocardial infarction is still suspected, repeat the test at appropriate intervals.   POC occult blood, ED RN will collect     Status: None   Collection Time: 03/14/16  5:37 AM  Result Value Ref Range   Fecal Occult Bld NEGATIVE NEGATIVE  CBG monitoring, ED     Status: None   Collection Time: 03/14/16  8:19 AM  Result Value Ref Range   Glucose-Capillary 72 65 - 99 mg/dL  Troponin I-serum (0, 3, 6 hours)     Status: None   Collection Time: 03/14/16  8:33 AM  Result Value Ref Range   Troponin I <0.03 <0.03 ng/mL  CBC     Status: Abnormal   Collection Time: 03/14/16  8:33 AM  Result Value Ref Range   WBC 6.3 4.0 - 10.5 K/uL   RBC 3.80 (L) 3.87 - 5.11 MIL/uL   Hemoglobin 10.7 (L) 12.0 - 15.0 g/dL   HCT 68.5 (L) 18.7 - 99.1 %   MCV 88.7 78.0 - 100.0 fL   MCH 28.2 26.0 - 34.0 pg   MCHC 31.8 30.0 - 36.0 g/dL   RDW 17.9 94.0 - 50.0 %   Platelets 206 150 - 400 K/uL  Creatinine, serum     Status: Abnormal   Collection Time: 03/14/16  8:33 AM  Result Value Ref Range   Creatinine, Ser 0.87 0.44 - 1.00 mg/dL   GFR calc non Af Amer 59 (L) >60 mL/min   GFR calc Af Amer >60 >60 mL/min    Comment: (NOTE) The eGFR has been calculated using the CKD EPI equation. This calculation has not been validated in all clinical situations. eGFR's persistently <60 mL/min signify possible Chronic Kidney Disease.   Troponin I-serum (0, 3, 6 hours)     Status: None   Collection Time: 03/14/16 10:52 AM  Result Value Ref Range   Troponin I <0.03 <0.03 ng/mL  CBG monitoring, ED     Status: None   Collection Time: 03/14/16 12:52 PM  Result Value Ref Range   Glucose-Capillary 99 65 - 99 mg/dL   Comment 1 Notify RN    Comment 2 Document in Chart   Troponin I-serum (0, 3, 6 hours)     Status: None   Collection Time: 03/14/16  2:54 PM  Result Value Ref Range   Troponin I <0.03 <0.03 ng/mL  Glucose, capillary     Status: None   Collection Time: 03/14/16  6:26 PM  Result Value Ref Range    Glucose-Capillary 71 65 - 99 mg/dL  Glucose, capillary     Status: Abnormal   Collection Time: 03/14/16 10:03 PM  Result Value Ref Range   Glucose-Capillary 122 (H) 65 - 99 mg/dL   Comment 1 Notify RN    Comment 2 Document in Chart   Glucose, capillary     Status: Abnormal   Collection Time: 03/15/16  6:40 AM  Result Value Ref Range   Glucose-Capillary 117 (H) 65 - 99 mg/dL   Dg Chest 2 View  Result Date: 03/14/2016 CLINICAL DATA:  Left-sided chest pain EXAM: CHEST  2 VIEW COMPARISON:  Chest radiograph 03/23/2015 FINDINGS: Cardiomediastinal contours are normal. No focal airspace consolidation or pulmonary edema. No pleural effusion or pneumothorax. Bilateral glenohumeral and acromioclavicular osteoarthrosis. IMPRESSION: No active cardiopulmonary disease. Electronically Signed   By: Deatra Robinson M.D.   On: 03/14/2016 06:09    ROS: General:no  colds or fevers, no weight changes Skin:no rashes or ulcers HEENT:no blurred vision, no congestion CV:see HPI PUL:see HPI GI:no diarrhea constipation on occ has had bright blood in stool.  no indigestion GU:no hematuria, no dysuria MS:+ joint pain, no claudication Neuro:no syncope, no lightheadedness Endo:+ diabetes she stated was controlled, no thyroid disease   Blood pressure (!) 132/47, pulse 61, temperature 98.1 F (36.7 C), temperature source Oral, resp. rate 18, weight 212 lb (96.2 kg), SpO2 99 %.  Wt Readings from Last 3 Encounters:  03/15/16 212 lb (96.2 kg)  01/02/16 214 lb (97.1 kg)  09/01/15 217 lb (98.4 kg)    PE: General:Pleasant affect, NAD Skin:Warm and dry, brisk capillary refill HEENT:normocephalic, sclera clear, mucus membranes moist Neck:supple, no JVD, no bruits  Heart:S1S2 RRR without murmur, gallup, rub or click Lungs:clear without rales, rhonchi, or wheezes WER:XVQMG, soft, non tender, + BS, do not palpate liver spleen or masses Ext:no lower ext edema, 2+ pedal pulses, 2+ radial pulses Neuro:alert and oriented  X 3, MAE, follows commands, + facial symmetry    Assessment/Plan Principal Problem:   Chest pain Active Problems:   HYPERTENSION, BENIGN   Type 2 diabetes mellitus (HCC)   Anemia   Chronic headaches   CHF (congestive heart failure) ([facility])   Asthma  Chest pain with negative troponins, chronic RBBB and LAFB. She is NPO, will do lexiscan myoview today. She is now on ASA and NTG paste.    HTN controlled though pt denies diagnosis.   DM-2 per IM  Anemia per IM    Cecilie Kicks  Nurse Practitioner Certified [place] Pager [phone] or after 5pm or weekends call [phone] 03/15/2016, 9:00 AM  As above, patient seen and examined. Briefly she is an 82 year old female with past medical history of diabetes mellitus, hypertension for evaluation of chest pain. Patient is able to ambulate with no dyspnea or chest pain. 2 days prior to this she developed chest pain at approximately 2 PM. The pain was in the left chest area radiating to her head, shoulder and back. It was a sharp pain with no associated symptoms. It increased with lying on her left side and moving her left upper extremity. The pain has been continuous for greater than 14 hours. Cardiology asked to evaluate. Enzymes negative. Electrocardiogram shows sinus rhythm, first-degree AV block, left axis deviation and right bundle branch block. No ST changes.  Chest pain is extremely atypical. Likely musculoskeletal. I do not think she requires further ischemia evaluation. She can be discharged from a cardiac standpoint and follow up with her primary care physician. Please call with questions.  Kirk Ruths, MD

## 2016-03-15 NOTE — Care Management Obs Status (Signed)
[facility] NOTIFICATION   Patient Details  Name: NATAIYA CZARNOWSKI MRN: [id number] Date of Birth: [birth year]   Medicare Observation Status Notification Given:  Yes    Erenest Rasher, RN 03/15/2016, 9:44 AM

## 2016-03-15 NOTE — Discharge Instructions (Signed)
Follow with Primary MD Philis Fendt, MD in 7 days   Get CBC, CMP, 2 view Chest X ray checked  by Primary MD or SNF MD in 5-7 days ( we routinely change or add medications that can affect your baseline labs and fluid status, therefore we recommend that you get the mentioned basic workup next visit with your PCP, your PCP may decide not to get them or add new tests based on their clinical decision)   Activity: As tolerated with Full fall precautions use walker/cane & assistance as needed   Disposition Home     Diet:   Diet Carb Modified Heart Healthy    For Heart failure patients - Check your Weight same time everyday, if you gain over 2 pounds, or you develop in leg swelling, experience more shortness of breath or chest pain, call your Primary MD immediately. Follow Cardiac Low Salt Diet and 1.5 lit/day fluid restriction.   On your next visit with your primary care physician please Get Medicines reviewed and adjusted.   Please request your Prim.MD to go over all Hospital Tests and Procedure/Radiological results at the follow up, please get all Hospital records sent to your Prim MD by signing hospital release before you go home.   If you experience worsening of your admission symptoms, develop shortness of breath, life threatening emergency, suicidal or homicidal thoughts you must seek medical attention immediately by calling 911 or calling your MD immediately  if symptoms less severe.  You Must read complete instructions/literature along with all the possible adverse reactions/side effects for all the Medicines you take and that have been prescribed to you. Take any new Medicines after you have completely understood and accpet all the possible adverse reactions/side effects.   Do not drive, operate heavy machinery, perform activities at heights, swimming or participation in water activities or provide baby sitting services if your were admitted for syncope or siezures until you have seen  by Primary MD or a Neurologist and advised to do so again.  Do not drive when taking Pain medications.    Do not take more than prescribed Pain, Sleep and Anxiety Medications  Special Instructions: If you have smoked or chewed Tobacco  in the last 2 yrs please stop smoking, stop any regular Alcohol  and or any Recreational drug use.  Wear Seat belts while driving.   Please note  You were cared for by a hospitalist during your hospital stay. If you have any questions about your discharge medications or the care you received while you were in the hospital after you are discharged, you can call the unit and asked to speak with the hospitalist on call if the hospitalist that took care of you is not available. Once you are discharged, your primary care physician will handle any further medical issues. Please note that NO REFILLS for any discharge medications will be authorized once you are discharged, as it is imperative that you return to your primary care physician (or establish a relationship with a primary care physician if you do not have one) for your aftercare needs so that they can reassess your need for medications and monitor your lab values.

## 2016-03-28 ENCOUNTER — Other Ambulatory Visit (INDEPENDENT_AMBULATORY_CARE_PROVIDER_SITE_OTHER): Payer: Medicare Other

## 2016-03-28 DIAGNOSIS — E1165 Type 2 diabetes mellitus with hyperglycemia: Secondary | ICD-10-CM

## 2016-03-28 LAB — BASIC METABOLIC PANEL
BUN: 15 mg/dL (ref 6–23)
CALCIUM: 9.5 mg/dL (ref 8.4–10.5)
CO2: 34 mEq/L — ABNORMAL HIGH (ref 19–32)
CREATININE: 1.01 mg/dL (ref 0.40–1.20)
Chloride: 102 mEq/L (ref 96–112)
GFR: 66.91 mL/min (ref >60.00)
GLUCOSE: 117 mg/dL — AB (ref 70–99)
Potassium: 4.5 mEq/L (ref 3.5–5.1)
SODIUM: 139 meq/L (ref 135–145)

## 2016-03-28 LAB — HEMOGLOBIN A1C: Hgb A1c MFr Bld: 6.3 % (ref 4.6–6.5)

## 2016-04-02 ENCOUNTER — Encounter: Payer: Self-pay | Admitting: Endocrinology

## 2016-04-02 ENCOUNTER — Ambulatory Visit (INDEPENDENT_AMBULATORY_CARE_PROVIDER_SITE_OTHER): Payer: Medicare Other | Admitting: Endocrinology

## 2016-04-02 VITALS — BP 138/60 | HR 64 | Ht 62.0 in | Wt 212.0 lb

## 2016-04-02 DIAGNOSIS — E119 Type 2 diabetes mellitus without complications: Secondary | ICD-10-CM | POA: Diagnosis not present

## 2016-04-02 NOTE — Patient Instructions (Addendum)
Check blood sugars on waking up  2-3x per week  Also check blood sugars about 2 hours after a meal and do this after different meals by rotation  Recommended blood sugar levels on waking up is 90-130 and about 2 hours after meal is 130-160  Please bring your blood sugar monitor to each visit, thank you  Take Metformin twice daily with food

## 2016-04-02 NOTE — Progress Notes (Signed)
Patient ID: Angelica Benson, female   DOB: 09-23-1928, 82 y.o.   MRN: [id number]   Reason for Appointment : Followup of diabetes and other issues  History of Present Illness          Diagnosis: Type 2 diabetes mellitus, date of diagnosis: 1992       Past history: She is unclear about the date of her diagnosis and treatment history. At onset she had symptoms of frequent urination, weakness and sleepiness. She has been on various oral hypoglycemic drugs since her diagnosis. Has been on metformin since at least 2006 and Januvia since 2007. Also previously had taken glyburide and Avandia Her A1c has generally been in the 5.9-6.4 range previously  Recent history:   She is on Januvia and her metformin was supposed to be increase back to twice a day but she is still taking it only once a day  She has had fairly consistent control, A1c 6.3 She was given a new glucose monitor on the last visit and she was probably getting falsely high readings previously Did not bring her monitor for download today She is not having any side effects from metformin but only taking it in the morning Her weight has leveled off  Oral hypoglycemic drugs the patient is taking are: Metformin 500 mg once a day, Januvia 154m      Side effects from medications have been: None  Glucose monitoring:  done once a day or less.   Glucometer: One Touch.       Blood Glucose readings not available today    Hypoglycemia:   none.   Self-care: The diet that the patient has been following is: tries to limit high-fat foods    Meals: 3 meals per day.  at breakfast has instant oatmeal and toast. At lunch will have sandwich and fruit; dinner is chicken, starch and vegetables; her snacks are fruit, peanut butter or cheese crackers      Exercise:    will walk around her living area Dietician visit: Most recent: 2000            Compliance with the medical regimen: Good  Weight history:  Wt Readings from Last 3  Encounters:  04/02/16 212 lb (96.2 kg)  03/15/16 212 lb (96.2 kg)  01/02/16 214 lb (97.1 kg)   Glycemic control:   Lab Results  Component Value Date   HGBA1C 6.3 03/28/2016   HGBA1C 6.1 12/27/2015   HGBA1C 6.3 08/29/2015   Lab Results  Component Value Date   MICROALBUR <0.7 12/27/2015   LDLCALC 81 12/27/2015   CREATININE 1.01 03/28/2016      Lab on 03/28/2016  Component Date Value Ref Range Status  . Hgb A1c MFr Bld 03/28/2016 6.3  4.6 - 6.5 % Final  . Sodium 03/28/2016 139  135 - 145 mEq/L Final  . Potassium 03/28/2016 4.5  3.5 - 5.1 mEq/L Final  . Chloride 03/28/2016 102  96 - 112 mEq/L Final  . CO2 03/28/2016 34* 19 - 32 mEq/L Final  . Glucose, Bld 03/28/2016 117* 70 - 99 mg/dL Final  . BUN 03/28/2016 15  6 - 23 mg/dL Final  . Creatinine, Ser 03/28/2016 1.01  0.40 - 1.20 mg/dL Final  . Calcium 03/28/2016 9.5  8.4 - 10.5 mg/dL Final  . GFR 03/28/2016 66.91  >60.00 mL/min Final    Allergies as of 04/02/2016      Reactions   Buprenorphine Hcl Rash   Morphine And Related Rash  Medication List       Accurate as of 04/02/16 11:59 PM. Always use your most recent med list.          aspirin EC 81 MG tablet Take 81 mg by mouth daily.   aspirin 325 MG tablet Take 1 tablet (325 mg total) by mouth daily.   gabapentin 100 MG capsule Commonly known as:  NEURONTIN Take 100 mg by mouth 2 (two) times daily.   glucose blood test strip Commonly known as:  ONETOUCH VERIO Used to check blood sugar once daily   hydrochlorothiazide 25 MG tablet Commonly known as:  HYDRODIURIL Take 12.5 mg by mouth daily.   hydrOXYzine 25 MG tablet Commonly known as:  ATARAX/VISTARIL Take 25 mg by mouth at bedtime as needed.   metFORMIN 500 MG tablet Commonly known as:  GLUCOPHAGE Take 500 mg by mouth 2 (two) times daily with a meal.   nitroGLYCERIN 0.4 MG SL tablet Commonly known as:  NITROSTAT Place 1 tablet (0.4 mg total) under the tongue every 5 (five) minutes as needed  for chest pain.   ONE TOUCH ULTRA 2 w/Device Kit Use to check blood sugar daily dx code 619.50   ONETOUCH DELICA LANCETS 93O Misc USE AS DIRECTED TO CHECK BLOOD SUGAR EVERY DAY Dx code E11.9   potassium chloride SA 20 MEQ tablet Commonly known as:  K-DUR,KLOR-CON Take 20 mEq by mouth daily.   sitaGLIPtin 100 MG tablet Commonly known as:  JANUVIA Take 100 mg by mouth daily.   traZODone 50 MG tablet Commonly known as:  DESYREL Take 50 mg by mouth at bedtime.   Vitamin D (Ergocalciferol) 50000 units Caps capsule Commonly known as:  DRISDOL TAKE 1 CAPSULE BY MOUTH TWICE A WEEK       Allergies:  Allergies  Allergen Reactions  . Buprenorphine Hcl Rash  . Morphine And Related Rash    Past Medical History:  Diagnosis Date  . Arthritis   . Asthma   . CHF (congestive heart failure) ([place])   . Chronic headaches   . Constipation   . Diabetes mellitus without complication ([place])   . Hemorrhoids without complication   . Hypertension     Past Surgical History:  Procedure Laterality Date  . ABDOMINAL HYSTERECTOMY    . BACK SURGERY    . CHOLECYSTECTOMY    . KNEE SURGERY      Family History  Problem Relation Age of Onset  . Diabetes Son   . Hypertension Son   . Cancer Sister     breast  . Diabetes Daughter     plus 2 grandkids  . Stroke Son     46moage dx, has enlarged heart  . Heart disease Son     passed away    Social History:  reports that she has never smoked. She has never used smokeless tobacco. She reports that she does not drink alcohol or use drugs.    Review of Systems         Lipids: Has not been on treatment with a statin drug, levels are normal without statin drugs  Lab Results  Component Value Date   CHOL 162 12/27/2015   HDL 57.80 12/27/2015   LDLCALC 81 12/27/2015   TRIG 114.0 12/27/2015   CHOLHDL 3 12/27/2015       The blood pressure has been controlled with HCTZ, followed by PCP    History of anemia, she sees her PCP  regularly:  Lab Results  Component Value Date  WBC 6.3 03/14/2016   HGB 10.7 (L) 03/14/2016   HCT 33.7 (L) 03/14/2016   MCV 88.7 03/14/2016   PLT 206 03/14/2016     NEUROPATHY: On 100 mg gabapentin At bedtime, taking it  regularly otherwise her legs will ache or tingle     Diabetic foot exam in 1/17    She has a history of a small goiter in the past, euthyroid  Lab Results  Component Value Date   TSH 1.62 11/15/2013    Physical Examination:  BP 138/60   Pulse 64   Ht _0  (1.575 m)   Wt 212 lb (96.2 kg)   BMI 38.78 kg/m      ASSESSMENT/ PLAN:   Diabetes type 2 with obesity, relatively mild and well controlled  See history of present illness for  discussion of current diabetes management, blood sugar patterns and problems identified  She appears to have fairly good control now considering her age A1c 6.3  Did not bring her monitor for download and she does not remember her readings She had taken metformin safely before and since her renal function is normal she can take at least 1000 mg a day, discussed that 500 mg once a day is not very effective  Her daughter is going to be involved in her care now including meal planning and medications Discussed checking blood sugars at different times by rotation, no more than one today  Patient Instructions  Check blood sugars on waking up  2-3x per week  Also check blood sugars about 2 hours after a meal and do this after different meals by rotation  Recommended blood sugar levels on waking up is 90-130 and about 2 hours after meal is 130-160  Please bring your blood sugar monitor to each visit, thank you  Take Metformin twice daily with food     Jayston Trevino 04/03/2016, 4:56 PM

## 2016-04-18 ENCOUNTER — Ambulatory Visit: Payer: Medicare Other | Admitting: Podiatry

## 2016-04-19 ENCOUNTER — Ambulatory Visit: Payer: Medicare Other | Admitting: Cardiology

## 2016-04-23 NOTE — Progress Notes (Signed)
HPI: FU chest pain. Echocardiogram December 2016 showed normal LV systolic function, mild mitral regurgitation and mild right ventricular enlargement. Carotid Dopplers December 2016 showed 1-39% bilateral stenosis. Patient admitted December 2017 with atypical chest pain. Electrocardiogram showed sinus rhythm, first-degree AV block and right bundle branch block. No ST changes. Enzymes negative. No further workup felt indicated. Since discharge she has some dyspnea on exertion but no orthopnea, PND, pedal edema, recurrent chest pain or syncope.  Current Outpatient Prescriptions  Medication Sig Dispense Refill  . aspirin 325 MG tablet Take 1 tablet (325 mg total) by mouth daily. 30 tablet 0  . aspirin EC 81 MG tablet Take 81 mg by mouth daily.    . Blood Glucose Monitoring Suppl (ONE TOUCH ULTRA 2) W/DEVICE KIT Use to check blood sugar daily dx code 250.00 1 each 0  . DULERA 200-5 MCG/ACT AERO Inhale 2 puffs into the lungs 2 (two) times daily as needed.    . fluticasone (FLONASE) 50 MCG/ACT nasal spray Place 1 spray into both nostrils 2 (two) times daily.    Marland Kitchen gabapentin (NEURONTIN) 100 MG capsule Take 100 mg by mouth 2 (two) times daily.     Marland Kitchen glucose blood (ONETOUCH VERIO) test strip Used to check blood sugar once daily 100 each 4  . hydrochlorothiazide (HYDRODIURIL) 25 MG tablet Take 12.5 mg by mouth daily.     . hydrOXYzine (ATARAX/VISTARIL) 25 MG tablet Take 25 mg by mouth at bedtime as needed.     . metFORMIN (GLUCOPHAGE) 500 MG tablet Take 500 mg by mouth 2 (two) times daily with a meal.    . nitroGLYCERIN (NITROSTAT) 0.4 MG SL tablet Place 1 tablet (0.4 mg total) under the tongue every 5 (five) minutes as needed for chest pain. 20 tablet 0  . omeprazole (PRILOSEC) 20 MG capsule Take 1 capsule by mouth daily.    Glory Rosebush DELICA LANCETS 28U MISC USE AS DIRECTED TO CHECK BLOOD SUGAR EVERY DAY Dx code E11.9 100 each 1  . potassium chloride SA (K-DUR,KLOR-CON) 20 MEQ tablet Take 20 mEq by  mouth daily.    . sitaGLIPtin (JANUVIA) 100 MG tablet Take 100 mg by mouth daily.    . traZODone (DESYREL) 50 MG tablet Take 50 mg by mouth at bedtime.     . triamcinolone cream (KENALOG) 0.5 % Use as directed    . Vitamin D, Ergocalciferol, (DRISDOL) 50000 UNITS CAPS capsule TAKE 1 CAPSULE BY MOUTH TWICE A WEEK (Patient taking differently: TAKE 1 CAPSULE BY MOUTH ONCE WEEKLY ON SATURDAY) 8 capsule 2   No current facility-administered medications for this visit.     Allergies  Allergen Reactions  . Buprenorphine Hcl Rash  . Morphine And Related Rash     Past Medical History:  Diagnosis Date  . Arthritis   . Asthma   . CHF (congestive heart failure) ([place])   . Chronic headaches   . Constipation   . Diabetes mellitus without complication ([place])   . Hemorrhoids without complication   . Hypertension     Past Surgical History:  Procedure Laterality Date  . ABDOMINAL HYSTERECTOMY    . BACK SURGERY    . CHOLECYSTECTOMY    . KNEE SURGERY      Social History   Social History  . Marital status: Single    Spouse name: N/A  . Number of children: N/A  . Years of education: N/A   Occupational History  . Not on file.   Social History Main  Topics  . Smoking status: Never Smoker  . Smokeless tobacco: Never Used  . Alcohol use No  . Drug use: No  . Sexual activity: Not on file   Other Topics Concern  . Not on file   Social History Narrative   Lives at home by herself, aide in the am.     Has 4 daughters and 3 living sons (one son died).    Uses walker.         Family History  Problem Relation Age of Onset  . Diabetes Son   . Hypertension Son   . Cancer Sister     breast  . Diabetes Daughter     plus 2 grandkids  . Stroke Son     27moage dx, has enlarged heart  . Heart disease Son     passed away    ROS: DOE but no fevers or chills, productive cough, hemoptysis, dysphasia, odynophagia, melena, hematochezia, dysuria, hematuria, rash, seizure activity,  orthopnea, PND, pedal edema, claudication. Remaining systems are negative.  Physical Exam:   Blood pressure (!) 164/66, pulse 84, height _0  (1.549 m), weight 211 lb 6 oz (95.9 kg).  General:  Well developed/obese in NAD Skin warm/dry Patient not depressed No peripheral clubbing Back-normal HEENT-normal/normal eyelids Neck supple/normal carotid upstroke bilaterally; no bruits; no JVD; no thyromegaly chest - CTA/ normal expansion CV - RRR/normal S1 and S2; no murmurs, rubs or gallops;  PMI nondisplaced Abdomen -NT/ND, no HSM, no mass, + bowel sounds, no bruit 2+ femoral pulses, no bruits Ext-no edema Neuro-grossly nonfocal   A/P  1 chest pain-symptoms previously felt to be musculoskeletal as outlined in note at time of hospitalization. All of her enzymes were negative. She has had no chest pain since discharge. I would not recommend further ischemia evaluation unless she has recurrent symptoms in the future.  2 Hypertension-blood pressure is mildly elevated. I have asked him to follow this and her medications can be advanced by primary care as needed.  3 diabetes mellitus-follow-up primary care.  BKirk Ruths MD

## 2016-04-24 ENCOUNTER — Encounter: Payer: Self-pay | Admitting: Podiatry

## 2016-04-24 ENCOUNTER — Ambulatory Visit (INDEPENDENT_AMBULATORY_CARE_PROVIDER_SITE_OTHER): Payer: Medicare Other | Admitting: Podiatry

## 2016-04-24 VITALS — Ht 62.0 in | Wt 212.0 lb

## 2016-04-24 DIAGNOSIS — B351 Tinea unguium: Secondary | ICD-10-CM | POA: Diagnosis not present

## 2016-04-24 DIAGNOSIS — E1149 Type 2 diabetes mellitus with other diabetic neurological complication: Secondary | ICD-10-CM

## 2016-04-24 DIAGNOSIS — M79676 Pain in unspecified toe(s): Secondary | ICD-10-CM

## 2016-04-24 NOTE — Progress Notes (Signed)
Patient ID: Angelica Benson, female   DOB: 07-19-1928, 83 y.o.   MRN: [id number] Complaint:  Visit Type: Patient returns to my office for continued preventative foot care services. Complaint: Patient states" my nails have grown long and thick and become painful to walk and wear shoes" Patient has been diagnosed with DM with neuropathy.. The patient presents for preventative foot care services. No changes to ROS  Podiatric Exam: Vascular: dorsalis pedis and posterior tibial pulses are palpable bilateral. Capillary return is immediate. Temperature gradient is WNL. Skin turgor WNL  Sensorium: Normal Semmes Weinstein monofilament test. Normal tactile sensation bilaterally. Nail Exam: Pt has thick disfigured discolored nails with subungual debris noted bilateral entire nail hallux through fifth toenails Ulcer Exam: There is no evidence of ulcer or pre-ulcerative changes or infection. Orthopedic Exam: Muscle tone and strength are WNL. No limitations in general ROM. No crepitus or effusions noted. Foot type and digits show no abnormalities. Bony prominences are unremarkable. Skin: No Porokeratosis. No infection or ulcers  Diagnosis:  Onychomycosis, , Pain in right toe, pain in left toes  Treatment & Plan Procedures and Treatment: Consent by patient was obtained for treatment procedures. The patient understood the discussion of treatment and procedures well. All questions were answered thoroughly reviewed. Debridement of mycotic and hypertrophic toenails, 1 through 5 bilateral and clearing of subungual debris. No ulceration, no infection noted.  Medical doctor says she does not qualify for diabetic shoes. Return Visit-Office Procedure: Patient instructed to return to the office for a follow up visit 3 months for continued evaluation and treatment.  Gardiner Barefoot DPM

## 2016-04-26 ENCOUNTER — Ambulatory Visit (INDEPENDENT_AMBULATORY_CARE_PROVIDER_SITE_OTHER): Payer: Medicare Other | Admitting: Cardiology

## 2016-04-26 ENCOUNTER — Encounter: Payer: Self-pay | Admitting: Cardiology

## 2016-04-26 VITALS — BP 164/66 | HR 84 | Ht 61.0 in | Wt 211.4 lb

## 2016-04-26 DIAGNOSIS — R079 Chest pain, unspecified: Secondary | ICD-10-CM | POA: Diagnosis not present

## 2016-04-26 DIAGNOSIS — I10 Essential (primary) hypertension: Secondary | ICD-10-CM

## 2016-04-26 NOTE — Patient Instructions (Signed)
Your physician recommends that you schedule a follow-up appointment in: AS NEEDED  

## 2016-05-31 ENCOUNTER — Ambulatory Visit: Payer: Medicare Other | Admitting: Family

## 2016-06-10 ENCOUNTER — Ambulatory Visit: Payer: Medicare Other | Admitting: Family

## 2016-06-24 ENCOUNTER — Ambulatory Visit: Payer: Medicare Other | Admitting: Family

## 2016-07-02 ENCOUNTER — Telehealth: Payer: Self-pay

## 2016-07-02 NOTE — Telephone Encounter (Signed)
Pre-Visit Call completed. 

## 2016-07-03 ENCOUNTER — Ambulatory Visit (INDEPENDENT_AMBULATORY_CARE_PROVIDER_SITE_OTHER): Payer: Medicare Other | Admitting: Family

## 2016-07-03 ENCOUNTER — Encounter: Payer: Self-pay | Admitting: Family

## 2016-07-03 VITALS — BP 148/56 | HR 66 | Temp 97.9°F | Resp 16 | Ht 61.0 in | Wt 218.4 lb

## 2016-07-03 DIAGNOSIS — E049 Nontoxic goiter, unspecified: Secondary | ICD-10-CM

## 2016-07-03 DIAGNOSIS — J45909 Unspecified asthma, uncomplicated: Secondary | ICD-10-CM | POA: Diagnosis not present

## 2016-07-03 DIAGNOSIS — E119 Type 2 diabetes mellitus without complications: Secondary | ICD-10-CM | POA: Diagnosis not present

## 2016-07-03 DIAGNOSIS — I159 Secondary hypertension, unspecified: Secondary | ICD-10-CM

## 2016-07-03 DIAGNOSIS — I5032 Chronic diastolic (congestive) heart failure: Secondary | ICD-10-CM

## 2016-07-03 DIAGNOSIS — K625 Hemorrhage of anus and rectum: Secondary | ICD-10-CM | POA: Diagnosis not present

## 2016-07-03 DIAGNOSIS — E785 Hyperlipidemia, unspecified: Secondary | ICD-10-CM

## 2016-07-03 DIAGNOSIS — G458 Other transient cerebral ischemic attacks and related syndromes: Secondary | ICD-10-CM | POA: Diagnosis not present

## 2016-07-03 LAB — CBC WITH DIFFERENTIAL/PLATELET
BASOS ABS: 0 10*3/uL (ref 0.0–0.1)
Basophils Relative: 0.7 % (ref 0.0–3.0)
EOS ABS: 0.2 10*3/uL (ref 0.0–0.7)
Eosinophils Relative: 4.2 % (ref 0.0–5.0)
HCT: 36.3 % (ref 36.0–46.0)
Hemoglobin: 11.7 g/dL — ABNORMAL LOW (ref 12.0–15.0)
LYMPHS ABS: 2.1 10*3/uL (ref 0.7–4.0)
Lymphocytes Relative: 36.4 % (ref 12.0–46.0)
MCHC: 32.2 g/dL (ref 30.0–36.0)
MCV: 88.5 fl (ref 78.0–100.0)
MONO ABS: 0.5 10*3/uL (ref 0.1–1.0)
Monocytes Relative: 8 % (ref 3.0–12.0)
Neutro Abs: 2.9 10*3/uL (ref 1.4–7.7)
Neutrophils Relative %: 50.7 % (ref 43.0–77.0)
PLATELETS: 217 10*3/uL (ref 150.0–400.0)
RBC: 4.1 Mil/uL (ref 3.87–5.11)
RDW: 15 % (ref 11.5–15.5)
WBC: 5.8 10*3/uL (ref 4.0–10.5)

## 2016-07-03 LAB — BASIC METABOLIC PANEL
BUN: 15 mg/dL (ref 6–23)
CALCIUM: 10 mg/dL (ref 8.4–10.5)
CHLORIDE: 102 meq/L (ref 96–112)
CO2: 29 meq/L (ref 19–32)
Creatinine, Ser: 0.97 mg/dL (ref 0.40–1.20)
GFR: 70.06 mL/min (ref >60.00)
GLUCOSE: 98 mg/dL (ref 70–99)
POTASSIUM: 4.9 meq/L (ref 3.5–5.1)
SODIUM: 138 meq/L (ref 135–145)

## 2016-07-03 LAB — LIPID PANEL
CHOL/HDL RATIO: 3
Cholesterol: 186 mg/dL (ref 0–200)
HDL: 63.6 mg/dL (ref >39.00)
LDL CALC: 98 mg/dL (ref 0–99)
NONHDL: 122.08
Triglycerides: 121 mg/dL (ref 0.0–149.0)
VLDL: 24.2 mg/dL (ref 0.0–40.0)

## 2016-07-03 MED ORDER — ONETOUCH ULTRA 2 W/DEVICE KIT: PACK | 0 refills | Status: DC

## 2016-07-03 NOTE — Progress Notes (Signed)
Pre visit review using our clinic review tool, if applicable. No additional management support is needed unless otherwise documented below in the visit note. 

## 2016-07-03 NOTE — Progress Notes (Signed)
Subjective:    Patient ID: Angelica Benson, female    DOB: Jun 10, 1928, 83 y.o.   MRN: [id number]  HPI  [name] is an 83 yr old female who presents today to establish care. Pmhx is significant for the following:  Previous PCP- Jeanella Anton MD ([facility])  DM2- She is followed by Dr. Dwyane Dee (endocrinology). Maintained on metformin and Januvia. Maintained on gabapentin for neuropathy. She has been on metformin and [place].  Denies hypoglycemia.  Lab Results  Component Value Date   HGBA1C 6.3 03/28/2016   HGBA1C 6.1 12/27/2015   HGBA1C 6.3 08/29/2015   Lab Results  Component Value Date   MICROALBUR <0.7 12/27/2015   LDLCALC 81 12/27/2015   CREATININE 1.01 03/28/2016   Chronic diastolic heart failure- Maintained on HCTZ.  Note was m  Goiter- noted in chart but pt denies hx.  Reports family hx of goiter. Lab Results  Component Value Date   TSH 1.62 11/15/2013   Asthma- maintained on dulera prn.    Vit D deficiency- maintained on weekly vit D.    TIA-  Lab Results  Component Value Date   CHOL 162 12/27/2015   HDL 57.80 12/27/2015   LDLCALC 81 12/27/2015   TRIG 114.0 12/27/2015   CHOLHDL 3 12/27/2015    GERD- reports symptoms are well controlled on omeprazole.  Has never tried to come off.   Reports intermittent blood on tissue after BM.   Review of Systems See HPI  Past Medical History:  Diagnosis Date  . Arthritis   . Asthma   . CHF (congestive heart failure) ([place])   . Chronic headaches   . Constipation   . Diabetes mellitus without complication ([place])   . Hemorrhoids without complication   . Hypertension      Social History   Social History  . Marital status: Single    Spouse name: N/A  . Number of children: N/A  . Years of education: N/A   Occupational History  . Not on file.   Social History Main Topics  . Smoking status: Never Smoker  . Smokeless tobacco: Never Used  . Alcohol use No  . Drug use: No  . Sexual activity: Not  on file   Other Topics Concern  . Not on file   Social History Narrative   Lives at home by herself, aide in the am.     Has 4 daughters and 3 living sons (one son died).    Uses walker.         Past Surgical History:  Procedure Laterality Date  . ABDOMINAL HYSTERECTOMY    . BACK SURGERY    . CHOLECYSTECTOMY    . KNEE SURGERY      Family History  Problem Relation Age of Onset  . Diabetes Son   . Hypertension Son   . Cancer Sister     breast  . Diabetes Daughter     plus 2 grandkids  . Stroke Son     49moage dx, has enlarged heart  . Heart disease Son     passed away    Allergies  Allergen Reactions  . Buprenorphine Hcl Rash  . Morphine And Related Rash    Current Outpatient Prescriptions on File Prior to Visit  Medication Sig Dispense Refill  . aspirin EC 81 MG tablet Take 81 mg by mouth daily.    . Blood Glucose Monitoring Suppl (ONE TOUCH ULTRA 2) W/DEVICE KIT Use to check blood sugar daily dx  code 250.00 1 each 0  . DULERA 200-5 MCG/ACT AERO Inhale 2 puffs into the lungs 2 (two) times daily as needed.    . fluticasone (FLONASE) 50 MCG/ACT nasal spray Place 1 spray into both nostrils 2 (two) times daily.    Marland Kitchen gabapentin (NEURONTIN) 100 MG capsule Take 100 mg by mouth 2 (two) times daily.     Marland Kitchen glucose blood (ONETOUCH VERIO) test strip Used to check blood sugar once daily 100 each 4  . hydrochlorothiazide (HYDRODIURIL) 25 MG tablet Take 12.5 mg by mouth daily.     . hydrOXYzine (ATARAX/VISTARIL) 25 MG tablet Take 25 mg by mouth at bedtime as needed.     . metFORMIN (GLUCOPHAGE) 500 MG tablet Take 500 mg by mouth 2 (two) times daily with a meal.    . omeprazole (PRILOSEC) 20 MG capsule Take 1 capsule by mouth daily.    Glory Rosebush DELICA LANCETS 99I MISC USE AS DIRECTED TO CHECK BLOOD SUGAR EVERY DAY Dx code E11.9 100 each 1  . potassium chloride SA (K-DUR,KLOR-CON) 20 MEQ tablet Take 20 mEq by mouth daily.    . sitaGLIPtin (JANUVIA) 100 MG tablet Take 100 mg by  mouth daily.    . traZODone (DESYREL) 50 MG tablet Take 50 mg by mouth at bedtime.     . triamcinolone cream (KENALOG) 0.5 % Use as directed    . Vitamin D, Ergocalciferol, (DRISDOL) 50000 UNITS CAPS capsule TAKE 1 CAPSULE BY MOUTH TWICE A WEEK (Patient taking differently: TAKE 1 CAPSULE BY MOUTH ONCE WEEKLY ON SATURDAY) 8 capsule 2   No current facility-administered medications on file prior to visit.     BP (!) 148/56 (BP Location: Right Arm, Patient Position: Sitting, Cuff Size: Normal)   Pulse 66   Temp 97.9 F (36.6 C) (Oral)   Resp 16   Ht 5' 1"  (1.549 m)   Wt 218 lb 6.4 oz (99.1 kg)   SpO2 100%   BMI 41.27 kg/m       Objective:   Physical Exam  Constitutional: She is oriented to person, place, and time. She appears well-developed and well-nourished.  HENT:  Head: Normocephalic and atraumatic.  Neck: Neck supple. No thyromegaly present.  Cardiovascular: Normal rate, regular rhythm and normal heart sounds.   No murmur heard. Pulmonary/Chest: Effort normal and breath sounds normal. No respiratory distress. She has no wheezes.  Musculoskeletal:  1+ bilateral LE edema  Lymphadenopathy:    She has no cervical adenopathy.  Neurological: She is alert and oriented to person, place, and time.  Psychiatric: She has a normal mood and affect. Her behavior is normal. Judgment and thought content normal.  Rectal exam: + external hemorrhoids.  Heme neg.         Assessment & Plan:  30 minutes spent with pt today. >50% of this time was spent counseling patient on her multiple medical issues and medications.   Rectal bleeding- mild.  Likely hemorrhoidal.  Unsure of last colo. Does not want to pursue work up of bleeding because she would not seek treatment if she had colon ca at her age.  Advised pt as follows:   Eat lots of fresh fruits/veggies. Add 1 tablespoon of miralax in 8 oz of fluid once daily- goal one sot stool a day. You may increase/decreas the dose as needed.  Go to ER  if you have severe rectal bleeding.  Purchase over the counter preparation H and apply rectally twice daily for the next week.  Will obtain CBC to assess for anemai.

## 2016-07-03 NOTE — Assessment & Plan Note (Signed)
Controlled on current medications. Management per Dr. Dwyane Dee.

## 2016-07-03 NOTE — Assessment & Plan Note (Signed)
BP acceptable for her age, continue current medications.

## 2016-07-03 NOTE — Patient Instructions (Addendum)
Please complete lab work prior to leaving. Eat lots of fresh fruits/veggies. Add 1 tablespoon of miralax in 8 oz of fluid once daily- goal one sot stool a day. You may increase/decreas the dose as needed.  Go to ER if you have severe rectal bleeding.  Purchase over the counter preparation H and apply rectally twice daily for the next week.

## 2016-07-03 NOTE — Assessment & Plan Note (Signed)
Clinically compensated.  Monitor.

## 2016-07-03 NOTE — Assessment & Plan Note (Signed)
No obvious goiter- will request old records.

## 2016-07-03 NOTE — Assessment & Plan Note (Signed)
Goal LDL<70, obtain lipid panel.

## 2016-07-03 NOTE — Assessment & Plan Note (Signed)
Stable only using dulera prn.

## 2016-07-04 ENCOUNTER — Ambulatory Visit (INDEPENDENT_AMBULATORY_CARE_PROVIDER_SITE_OTHER): Payer: Medicare Other | Admitting: Podiatry

## 2016-07-04 ENCOUNTER — Encounter: Payer: Self-pay | Admitting: Podiatry

## 2016-07-04 DIAGNOSIS — M79676 Pain in unspecified toe(s): Secondary | ICD-10-CM

## 2016-07-04 DIAGNOSIS — B351 Tinea unguium: Secondary | ICD-10-CM | POA: Diagnosis not present

## 2016-07-04 DIAGNOSIS — E1149 Type 2 diabetes mellitus with other diabetic neurological complication: Secondary | ICD-10-CM

## 2016-07-04 NOTE — Progress Notes (Signed)
Patient ID: Angelica Benson, female   DOB: [birth year], 83 y.o.   MRN: [id number] Complaint:  Visit Type: Patient returns to my office for continued preventative foot care services. Complaint: Patient states" my nails have grown long and thick and become painful to walk and wear shoes" Patient has been diagnosed with DM with neuropathy.. The patient presents for preventative foot care services. No changes to ROS  Podiatric Exam: Vascular: dorsalis pedis and posterior tibial pulses are palpable bilateral. Capillary return is immediate. Temperature gradient is WNL. Skin turgor WNL  Sensorium: Normal Semmes Weinstein monofilament test. Normal tactile sensation bilaterally. Nail Exam: Pt has thick disfigured discolored nails with subungual debris noted bilateral entire nail hallux through fifth toenails Ulcer Exam: There is no evidence of ulcer or pre-ulcerative changes or infection. Orthopedic Exam: Muscle tone and strength are WNL. No limitations in general ROM. No crepitus or effusions noted. Foot type and digits show no abnormalities. Bony prominences are unremarkable. Skin: No Porokeratosis. No infection or ulcers  Diagnosis:  Onychomycosis, , Pain in right toe, pain in left toes  Treatment & Plan Procedures and Treatment: Consent by patient was obtained for treatment procedures. The patient understood the discussion of treatment and procedures well. All questions were answered thoroughly reviewed. Debridement of mycotic and hypertrophic toenails, 1 through 5 bilateral and clearing of subungual debris. No ulceration, no infection noted.   Return Visit-Office Procedure: Patient instructed to return to the office for a follow up visit 3 months for continued evaluation and treatment.  Gardiner Barefoot DPM

## 2016-07-10 ENCOUNTER — Ambulatory Visit: Payer: Medicare Other | Admitting: Podiatry

## 2016-07-11 ENCOUNTER — Encounter: Payer: Self-pay | Admitting: Family

## 2016-07-29 ENCOUNTER — Other Ambulatory Visit (INDEPENDENT_AMBULATORY_CARE_PROVIDER_SITE_OTHER): Payer: Medicare Other

## 2016-07-29 DIAGNOSIS — E119 Type 2 diabetes mellitus without complications: Secondary | ICD-10-CM

## 2016-07-29 LAB — COMPREHENSIVE METABOLIC PANEL
ALT: 8 U/L (ref 0–35)
AST: 17 U/L (ref 0–37)
Albumin: 3.7 g/dL (ref 3.5–5.2)
Alkaline Phosphatase: 65 U/L (ref 39–117)
BILIRUBIN TOTAL: 0.4 mg/dL (ref 0.2–1.2)
BUN: 14 mg/dL (ref 6–23)
CO2: 29 meq/L (ref 19–32)
CREATININE: 0.92 mg/dL (ref 0.40–1.20)
Calcium: 9.5 mg/dL (ref 8.4–10.5)
Chloride: 103 mEq/L (ref 96–112)
GFR: 74.45 mL/min (ref >60.00)
GLUCOSE: 108 mg/dL — AB (ref 70–99)
POTASSIUM: 3.8 meq/L (ref 3.5–5.1)
Sodium: 138 mEq/L (ref 135–145)
Total Protein: 7.2 g/dL (ref 6.0–8.3)

## 2016-07-29 LAB — HEMOGLOBIN A1C: HEMOGLOBIN A1C: 6.7 % — AB (ref 4.6–6.5)

## 2016-07-31 ENCOUNTER — Telehealth: Payer: Self-pay | Admitting: Endocrinology

## 2016-07-31 ENCOUNTER — Other Ambulatory Visit: Payer: Self-pay

## 2016-07-31 MED ORDER — GLUCOSE BLOOD VI STRP: ORAL_STRIP | 4 refills | Status: DC

## 2016-07-31 NOTE — Telephone Encounter (Signed)
Ordered

## 2016-07-31 NOTE — Telephone Encounter (Signed)
Pt needs her test strips refilled and sent to the [facility] on [address].

## 2016-08-01 ENCOUNTER — Ambulatory Visit: Payer: Medicare Other | Admitting: Endocrinology

## 2016-08-06 ENCOUNTER — Emergency Department (HOSPITAL_COMMUNITY): Payer: Medicare Other

## 2016-08-06 ENCOUNTER — Emergency Department (HOSPITAL_COMMUNITY)
Admission: EM | Admit: 2016-08-06 | Discharge: 2016-08-06 | Disposition: A | Discharge status: Home / Self Care | Payer: Medicare Other | Attending: Emergency Medicine | Admitting: Emergency Medicine

## 2016-08-06 ENCOUNTER — Encounter (HOSPITAL_COMMUNITY): Payer: Self-pay

## 2016-08-06 DIAGNOSIS — J40 Bronchitis, not specified as acute or chronic: Secondary | ICD-10-CM | POA: Diagnosis not present

## 2016-08-06 DIAGNOSIS — E119 Type 2 diabetes mellitus without complications: Secondary | ICD-10-CM | POA: Diagnosis not present

## 2016-08-06 DIAGNOSIS — I509 Heart failure, unspecified: Secondary | ICD-10-CM | POA: Diagnosis not present

## 2016-08-06 DIAGNOSIS — Z79899 Other long term (current) drug therapy: Secondary | ICD-10-CM | POA: Insufficient documentation

## 2016-08-06 DIAGNOSIS — Z7984 Long term (current) use of oral hypoglycemic drugs: Secondary | ICD-10-CM | POA: Insufficient documentation

## 2016-08-06 DIAGNOSIS — I110 Hypertensive heart disease with heart failure: Secondary | ICD-10-CM | POA: Diagnosis not present

## 2016-08-06 DIAGNOSIS — Z7982 Long term (current) use of aspirin: Secondary | ICD-10-CM | POA: Diagnosis not present

## 2016-08-06 DIAGNOSIS — J209 Acute bronchitis, unspecified: Secondary | ICD-10-CM

## 2016-08-06 DIAGNOSIS — Z8673 Personal history of transient ischemic attack (TIA), and cerebral infarction without residual deficits: Secondary | ICD-10-CM | POA: Insufficient documentation

## 2016-08-06 DIAGNOSIS — R0602 Shortness of breath: Secondary | ICD-10-CM | POA: Diagnosis present

## 2016-08-06 LAB — TROPONIN I

## 2016-08-06 LAB — BASIC METABOLIC PANEL
ANION GAP: 14 (ref 5–15)
BUN: 11 mg/dL (ref 6–20)
CHLORIDE: 100 mmol/L — AB (ref 101–111)
CO2: 26 mmol/L (ref 22–32)
Calcium: 9.3 mg/dL (ref 8.9–10.3)
Creatinine, Ser: 1.02 mg/dL — ABNORMAL HIGH (ref 0.44–1.00)
GFR, EST AFRICAN AMERICAN: 56 mL/min — AB (ref >60)
GFR, EST NON AFRICAN AMERICAN: 49 mL/min — AB (ref >60)
Glucose, Bld: 163 mg/dL — ABNORMAL HIGH (ref 65–99)
POTASSIUM: 3.8 mmol/L (ref 3.5–5.1)
SODIUM: 140 mmol/L (ref 135–145)

## 2016-08-06 LAB — CBC
HEMATOCRIT: 34.8 % — AB (ref 36.0–46.0)
HEMOGLOBIN: 11.1 g/dL — AB (ref 12.0–15.0)
MCH: 28.4 pg (ref 26.0–34.0)
MCHC: 31.9 g/dL (ref 30.0–36.0)
MCV: 89 fL (ref 78.0–100.0)
Platelets: 200 10*3/uL (ref 150–400)
RBC: 3.91 MIL/uL (ref 3.87–5.11)
RDW: 15.1 % (ref 11.5–15.5)
WBC: 5.4 10*3/uL (ref 4.0–10.5)

## 2016-08-06 LAB — BRAIN NATRIURETIC PEPTIDE: B NATRIURETIC PEPTIDE 5: 83.5 pg/mL (ref 0.0–100.0)

## 2016-08-06 LAB — D-DIMER, QUANTITATIVE: D-Dimer, Quant: 2.03 ug/mL-FEU — ABNORMAL HIGH (ref 0.00–0.50)

## 2016-08-06 MED ORDER — ALBUTEROL SULFATE (2.5 MG/3ML) 0.083% IN NEBU: 5.0000 mg | INHALATION_SOLUTION | Freq: Once | RESPIRATORY_TRACT | Status: DC

## 2016-08-06 MED ORDER — DOXYCYCLINE HYCLATE 100 MG PO CAPS: 100.0000 mg | ORAL_CAPSULE | Freq: Two times a day (BID) | ORAL | 0 refills | Status: DC

## 2016-08-06 MED ORDER — IOPAMIDOL (ISOVUE-370) INJECTION 76%
INTRAVENOUS | Status: AC
  Administered 2016-08-06: 100 mL
  Filled 2016-08-06: qty 100

## 2016-08-06 MED ORDER — IPRATROPIUM-ALBUTEROL 0.5-2.5 (3) MG/3ML IN SOLN
3.0000 mL | Freq: Once | RESPIRATORY_TRACT | Status: AC
  Administered 2016-08-06: 3 mL via RESPIRATORY_TRACT
  Filled 2016-08-06: qty 3

## 2016-08-06 MED ORDER — PREDNISONE 20 MG PO TABS: 40.0000 mg | ORAL_TABLET | Freq: Every day | ORAL | 0 refills | Status: DC

## 2016-08-06 NOTE — Discharge Instructions (Signed)
Follow-up with your doctor later this week, continue your current medications, take the steroids and antibiotics as prescribed

## 2016-08-06 NOTE — ED Notes (Signed)
Patient Alert and oriented X4. Stable and ambulatory. Patient verbalized understanding of the discharge instructions.  Patient belongings were taken by the patient.  

## 2016-08-06 NOTE — ED Triage Notes (Signed)
Patient here for shortness of breath.  Patient has gotten short of breath over last day.  EMS gave 5mg  albuterol.  Using accessory muscles and lungs decreased.  Patient is A&Ox4

## 2016-08-06 NOTE — ED Notes (Signed)
Patient taken to CT.

## 2016-08-06 NOTE — ED Notes (Signed)
Patient taken to X-Ray.

## 2016-08-06 NOTE — ED Provider Notes (Signed)
[facility] DEPT Provider Note   CSN: [id number] Arrival date & time: 08/06/16  1709   History   Chief Complaint Chief Complaint  Patient presents with  . Shortness of Breath    HPI Angelica Benson is a 83 y.o. female.  HPI Pt started feeling short of breath yesterday.   She has been coughing up yellow then white mucus.  She starts coughing a lot and gets short of breath.  SHe also starts getting pain in her chest when she coughs.  NO fevers.  No leg swelling.  No weight gain.  She has had some rhinitis and congestio Past Medical History:  Diagnosis Date  . Arthritis   . Asthma   . CHF (congestive heart failure) ([place])   . Chronic headaches   . Constipation   . Diabetes mellitus without complication ([place])   . Hemorrhoids without complication   . Hypertension     Patient Active Problem List   Diagnosis Date Noted  . Hypertension   . Diabetes mellitus without complication ([place])   . Chronic headaches   . CHF (congestive heart failure) ([place])   . Asthma   . Carpal tunnel syndrome 09/04/2015  . Cervical disc disorder with radiculopathy of cervical region   . TIA (transient ischemic attack) 03/22/2015  . Obesity, unspecified 08/18/2013  . Goiter 08/18/2013  . Anemia, chronic disease 08/18/2013    Past Surgical History:  Procedure Laterality Date  . ABDOMINAL HYSTERECTOMY    . BACK SURGERY    . CHOLECYSTECTOMY    . KNEE SURGERY      OB History    No data available       Home Medications    Prior to Admission medications   Medication Sig Start Date End Date Taking? Authorizing Provider  aspirin EC 81 MG tablet Take 81 mg by mouth daily.    Historical Provider, MD  Blood Glucose Monitoring Suppl (ONE TOUCH ULTRA 2) w/Device KIT Use to check blood sugar daily dx code 250.00 07/03/16   Debbrah Alar, NP  doxycycline (VIBRAMYCIN) 100 MG capsule Take 1 capsule (100 mg total) by mouth 2 (two) times daily. 08/06/16   Dorie Rank, MD  DULERA 200-5 MCG/ACT AERO  Inhale 2 puffs into the lungs 2 (two) times daily as needed. 03/28/16   Historical Provider, MD  fluticasone (FLONASE) 50 MCG/ACT nasal spray Place 1 spray into both nostrils 2 (two) times daily. 02/29/16   Historical Provider, MD  gabapentin (NEURONTIN) 100 MG capsule Take 100 mg by mouth 2 (two) times daily.  10/28/13   Historical Provider, MD  glucose blood (ONETOUCH VERIO) test strip Used to check blood sugar once daily 07/31/16   Elayne Snare, MD  hydrochlorothiazide (HYDRODIURIL) 25 MG tablet Take 12.5 mg by mouth daily.     Historical Provider, MD  hydrOXYzine (ATARAX/VISTARIL) 25 MG tablet Take 25 mg by mouth at bedtime as needed.  04/20/15   Historical Provider, MD  metFORMIN (GLUCOPHAGE) 500 MG tablet Take 500 mg by mouth 2 (two) times daily with a meal.    Historical Provider, MD  [facility] DELICA LANCETS 57W MISC USE AS DIRECTED TO [place] Dx code E11.9 08/16/15   Elayne Snare, MD  potassium chloride SA (K-DUR,KLOR-CON) 20 MEQ tablet Take 20 mEq by mouth daily.    Historical Provider, MD  predniSONE (DELTASONE) 20 MG tablet Take 2 tablets (40 mg total) by mouth daily with breakfast. 08/06/16   Dorie Rank, MD  sitaGLIPtin (JANUVIA) 100 MG  tablet Take 100 mg by mouth daily.    Historical Provider, MD  traZODone (DESYREL) 50 MG tablet Take 50 mg by mouth at bedtime.  11/16/14   Historical Provider, MD  triamcinolone cream (KENALOG) 0.5 % Use as directed 02/29/16   Historical Provider, MD  Vitamin D, Ergocalciferol, (DRISDOL) 50000 UNITS CAPS capsule TAKE 1 CAPSULE BY MOUTH TWICE A WEEK Patient taking differently: TAKE 1 CAPSULE BY MOUTH ONCE WEEKLY ON SATURDAY 02/24/14   Elayne Snare, MD    Family History Family History  Problem Relation Age of Onset  . Diabetes Son   . Hypertension Son   . Cancer Sister     breast  . Diabetes Daughter     plus 2 grandkids  . Stroke Son     59moage dx, has enlarged heart  . Heart disease Son     passed away    Social History Social  History  Substance Use Topics  . Smoking status: Never Smoker  . Smokeless tobacco: Never Used  . Alcohol use No     Allergies   Buprenorphine hcl and Morphine and related   Review of Systems Review of Systems  All other systems reviewed and are negative.    Physical Exam Updated Vital Signs BP (!) 130/56   Pulse 87   Temp 98.2 F (36.8 C) (Oral)   Resp (!) 23   SpO2 99%   Physical Exam  Constitutional: No distress.  HENT:  Head: Normocephalic and atraumatic.  Right Ear: External ear normal.  Left Ear: External ear normal.  Eyes: Conjunctivae are normal. Right eye exhibits no discharge. Left eye exhibits no discharge. No scleral icterus.  Neck: Neck supple. No tracheal deviation present.  Cardiovascular: Normal rate, regular rhythm and intact distal pulses.   Pulmonary/Chest: Effort normal. No stridor. No respiratory distress. She has wheezes (few wheezes noted). She has no rales.  Abdominal: Soft. Bowel sounds are normal. She exhibits no distension. There is no tenderness. There is no rebound and no guarding.  Musculoskeletal: She exhibits no edema or tenderness.  Neurological: She is alert. She has normal strength. No cranial nerve deficit (no facial droop, extraocular movements intact, no slurred speech) or sensory deficit. She exhibits normal muscle tone. She displays no seizure activity. Coordination normal.  Skin: Skin is warm and dry. No rash noted.  Psychiatric: She has a normal mood and affect.  Nursing note and vitals reviewed.    ED Treatments / Results  Labs (all labs ordered are listed, but only abnormal results are displayed) Labs Reviewed  BASIC METABOLIC PANEL - Abnormal; Notable for the following:       Result Value   Chloride 100 (*)    Glucose, Bld 163 (*)    Creatinine, Ser 1.02 (*)    GFR calc non Af Amer 49 (*)    GFR calc Af Amer 56 (*)    All other components within normal limits  CBC - Abnormal; Notable for the following:     Hemoglobin 11.1 (*)    HCT 34.8 (*)    All other components within normal limits  D-DIMER, QUANTITATIVE (NOT AT [facility] - Abnormal; Notable for the following:    D-Dimer, Quant 2.03 (*)    All other components within normal limits  TROPONIN I  BRAIN NATRIURETIC PEPTIDE    EKG  EKG Interpretation  Date/Time:  Tuesday August 06 2016 17:17:23 EDT Ventricular Rate:  95 PR Interval:    QRS Duration: 143 QT Interval:  374  QTC Calculation: 471 R Axis:   -97 Text Interpretation:  Sinus rhythm RBBB and LAFB Since last tracing rate faster Confirmed by Ting Cage  MD-J, Saige Canton ([id number]) on 08/06/2016 5:25:15 PM       Radiology Dg Chest 2 View  Result Date: 08/06/2016 CLINICAL DATA:  Dyspnea and cough x1 day EXAM: CHEST  2 VIEW COMPARISON:  03/14/2016 FINDINGS: The heart size and mediastinal contours are within normal limits. Aortic atherosclerosis at the arch without aneurysm. Both lungs are clear. Osteoarthritis of the [facility] and glenohumeral joints bilaterally. Degenerate changes are also noted along the dorsal spine. No acute osseous appearing abnormality. IMPRESSION: No active cardiopulmonary disease.  Aortic atherosclerosis. Electronically Signed   By: Ashley Royalty M.D.   On: 08/06/2016 18:08   Ct Angio Chest Pe W And/or Wo Contrast  Result Date: 08/06/2016 CLINICAL DATA:  Dyspnea, elevated D-dimer EXAM: CT ANGIOGRAPHY CHEST WITH CONTRAST TECHNIQUE: Multidetector CT imaging of the chest was performed using the standard protocol during bolus administration of intravenous contrast. Multiplanar CT image reconstructions and MIPs were obtained to evaluate the vascular anatomy. CONTRAST:  100 cc Isovue 370 IV COMPARISON:  Chest CT report from 09/10/2001 FINDINGS: Cardiovascular: Normal branch pattern of the great vessels off the aortic arch. No aortic aneurysm. There is aortic atherosclerosis. No large central pulmonary embolus is identified. Thin bandlike hypodensity within a segmental branch of the right lower  lobe may be artifactual due to a branch point for the pulmonary vessel or from adjacent streak artifact. This not confirmed to be a pulmonary embolus on coronal or sagittal mip imaging. Cardiac chambers are top-normal in size. No pericardial effusion. Mediastinum/Nodes: Mild reactive appearing lymphadenopathy the largest is left lower paratracheal and 12 mm short axis. Mainstem trachea bronchi are unremarkable. No hilar lymphadenopathy. Esophagus is unremarkable. No thyromegaly. Lungs/Pleura: Lungs are clear. No pleural effusion or pneumothorax. Upper Abdomen: No acute abnormality. Musculoskeletal: Diffuse idiopathic skeletal hyperostosis of the dorsal spine. No acute nor suspicious osseous abnormality. Review of the MIP images confirms the above findings. IMPRESSION: 1. No acute large central pulmonary embolus. Aortic atherosclerosis without aneurysm or dissection. 2. Probable mild reactive mediastinal lymphadenopathy. 3. Diffuse idiopathic skeletal hyperostosis of the dorsal spine. Electronically Signed   By: Ashley Royalty M.D.   On: 08/06/2016 21:31    Procedures Procedures (including critical care time)  Medications Ordered in ED Medications  ipratropium-albuterol (DUONEB) 0.5-2.5 (3) MG/3ML nebulizer solution 3 mL (3 mLs Nebulization Given 08/06/16 1930)  iopamidol (ISOVUE-370) 76 % injection (100 mLs  Contrast Given 08/06/16 2058)     Initial Impression / Assessment and Plan / ED Course  I have reviewed the triage vital signs and the nursing notes.  Pertinent labs & imaging results that were available during my care of the patient were reviewed by me and considered in my medical decision making (see chart for details).   patient presented to the emergency room with respiratory symptoms, cough, congestion and shortness of breath. Exam she was noted have some mild wheezing. Chest x-ray did not show any evidence of CHF or pneumonia. D-dimer was elevated so a CT scan was performed. No evidence of  PE.  Suspect her symptoms are related to mild bronchospasm. Patient does have a history of wheezing in the past. She has inhalers at home. Will have her continue her inhalers. I will give her a short course of steroids. Have her follow up with her primary doctor.  Final Clinical Impressions(s) / ED Diagnoses   Final diagnoses:  Bronchitis  with bronchospasm    New Prescriptions New Prescriptions   DOXYCYCLINE (VIBRAMYCIN) 100 MG CAPSULE    Take 1 capsule (100 mg total) by mouth 2 (two) times daily.   PREDNISONE (DELTASONE) 20 MG TABLET    Take 2 tablets (40 mg total) by mouth daily with breakfast.     Dorie Rank, MD 08/06/16 2214

## 2016-08-20 ENCOUNTER — Ambulatory Visit (INDEPENDENT_AMBULATORY_CARE_PROVIDER_SITE_OTHER): Payer: Medicare Other | Admitting: Endocrinology

## 2016-08-20 ENCOUNTER — Encounter: Payer: Self-pay | Admitting: Endocrinology

## 2016-08-20 VITALS — BP 148/66 | HR 66 | Ht 61.0 in | Wt 215.8 lb

## 2016-08-20 DIAGNOSIS — E1165 Type 2 diabetes mellitus with hyperglycemia: Secondary | ICD-10-CM

## 2016-08-20 NOTE — Progress Notes (Signed)
Patient ID: Angelica Benson, female   DOB: 05/29/1928, 83 y.o.   MRN: [id number]   Reason for Appointment : Followup of diabetes and other issues  History of Present Illness          Diagnosis: Type 2 diabetes mellitus, date of diagnosis: 1992       Past history: She is unclear about the date of her diagnosis and treatment history. At onset she had symptoms of frequent urination, weakness and sleepiness. She has been on various oral hypoglycemic drugs since her diagnosis. Has been on metformin since at least 2006 and Januvia since 2007. Also previously had taken glyburide and Avandia Her A1c has generally been in the 5.9-6.4 range previously  Recent history:   Oral hypoglycemic drugs the patient is taking are: Metformin 500 mg 2x a day, Januvia 142m    She has had fairly consistent control, A1c 6.7, previously 6.3 She was told to increase her metformin to twice a day on the last visit Not clear why she is not checking her blood sugar at home, she does have the Verio and One Touch ultra meters and has on expired test strips also She is now saying that she cannot figure out how to use the test strips for the new meter Her weight has gone up slightly since her last visit, she thinks her appetite is excessive Recent blood sugars available are near normal     Side effects from medications have been: None  Glucose monitoring:  done once a day or less.   Glucometer: One Touch.       Blood Glucose readings  only one reading from this morning available today from her home monitor    Hypoglycemia:   none.   Self-care: The diet that the patient has been following is: tries to limit high-fat foods    Meals: 3 meals per day.  at breakfast has instant oatmeal and toast. At lunch will have sandwich and fruit; dinner is chicken, starch and vegetables; her snacks are fruit, peanut butter or cheese crackers      Exercise:    will walk around her living area, uses a walker Dietician  visit: Most recent: 2000            Compliance with the medical regimen: Fair  Weight history:  Wt Readings from Last 3 Encounters:  08/20/16 215 lb 12.8 oz (97.9 kg)  07/03/16 218 lb 6.4 oz (99.1 kg)  04/26/16 211 lb 6 oz (95.9 kg)   Glycemic control:   Lab Results  Component Value Date   HGBA1C 6.7 (H) 07/29/2016   HGBA1C 6.3 03/28/2016   HGBA1C 6.1 12/27/2015   Lab Results  Component Value Date   MICROALBUR <0.7 12/27/2015   LDLCALC 98 07/03/2016   CREATININE 1.02 (H) 08/06/2016      No visits with results within 1 Week(s) from this visit.  Latest known visit with results is:  Admission on 08/06/2016, Discharged on 08/06/2016  Component Date Value Ref Range Status  . Sodium 08/06/2016 140  135 - 145 mmol/L Final  . Potassium 08/06/2016 3.8  3.5 - 5.1 mmol/L Final  . Chloride 08/06/2016 100* 101 - 111 mmol/L Final  . CO2 08/06/2016 26  22 - 32 mmol/L Final  . Glucose, Bld 08/06/2016 163* 65 - 99 mg/dL Final  . BUN 08/06/2016 11  6 - 20 mg/dL Final  . Creatinine, Ser 08/06/2016 1.02* 0.44 - 1.00 mg/dL Final  . Calcium 08/06/2016 9.3  8.9 -  10.3 mg/dL Final  . GFR calc non Af Amer 08/06/2016 49* >60 mL/min Final  . GFR calc Af Amer 08/06/2016 56* >60 mL/min Final   Comment: (NOTE) The eGFR has been calculated using the CKD EPI equation. This calculation has not been validated in all clinical situations. eGFR's persistently <60 mL/min signify possible Chronic Kidney Disease.   . Anion gap 08/06/2016 14  5 - 15 Final  . WBC 08/06/2016 5.4  4.0 - 10.5 K/uL Final  . RBC 08/06/2016 3.91  3.87 - 5.11 MIL/uL Final  . Hemoglobin 08/06/2016 11.1* 12.0 - 15.0 g/dL Final  . HCT 08/06/2016 34.8* 36.0 - 46.0 % Final  . MCV 08/06/2016 89.0  78.0 - 100.0 fL Final  . MCH 08/06/2016 28.4  26.0 - 34.0 pg Final  . MCHC 08/06/2016 31.9  30.0 - 36.0 g/dL Final  . RDW 08/06/2016 15.1  11.5 - 15.5 % Final  . Platelets 08/06/2016 200  150 - 400 K/uL Final  . D-Dimer, Quant  08/06/2016 2.03* 0.00 - 0.50 ug/mL-FEU Final   Comment: (NOTE) At the manufacturer cut-off of 0.50 ug/mL FEU, this assay has been documented to exclude PE with a sensitivity and negative predictive value of 97 to 99%.  At this time, this assay has not been approved by the FDA to exclude DVT/VTE. Results should be correlated with clinical presentation.   . Troponin I 08/06/2016 <0.03  <0.03 ng/mL Final  . B Natriuretic Peptide 08/06/2016 83.5  0.0 - 100.0 pg/mL Final    Allergies as of 08/20/2016      Reactions   Buprenorphine Hcl Rash   Morphine And Related Rash      Medication List       Accurate as of 08/20/16  8:19 PM. Always use your most recent med list.          aspirin EC 81 MG tablet Take 81 mg by mouth daily.   doxycycline 100 MG capsule Commonly known as:  VIBRAMYCIN Take 1 capsule (100 mg total) by mouth 2 (two) times daily.   DULERA 200-5 MCG/ACT Aero Generic drug:  mometasone-formoterol Inhale 2 puffs into the lungs 2 (two) times daily as needed.   fluticasone 50 MCG/ACT nasal spray Commonly known as:  FLONASE Place 1 spray into both nostrils 2 (two) times daily.   gabapentin 100 MG capsule Commonly known as:  NEURONTIN Take 100 mg by mouth 2 (two) times daily.   glucose blood test strip Commonly known as:  ONETOUCH VERIO Used to check blood sugar once daily   hydrochlorothiazide 25 MG tablet Commonly known as:  HYDRODIURIL Take 12.5 mg by mouth daily.   hydrOXYzine 25 MG tablet Commonly known as:  ATARAX/VISTARIL Take 25 mg by mouth at bedtime as needed.   metFORMIN 500 MG tablet Commonly known as:  GLUCOPHAGE Take 500 mg by mouth 2 (two) times daily with a meal.   nitroGLYCERIN 0.4 MG SL tablet Commonly known as:  NITROSTAT Place 0.4 mg under the tongue every 5 (five) minutes as needed for chest pain.   ONE TOUCH ULTRA 2 w/Device Kit Use to check blood sugar daily dx code 092.33   ONETOUCH DELICA LANCETS 00T Misc USE AS DIRECTED TO CHECK  BLOOD SUGAR EVERY DAY Dx code E11.9   potassium chloride SA 20 MEQ tablet Commonly known as:  K-DUR,KLOR-CON Take 20 mEq by mouth daily.   sitaGLIPtin 100 MG tablet Commonly known as:  JANUVIA Take 100 mg by mouth daily.   traZODone 50 MG  tablet Commonly known as:  DESYREL Take 50 mg by mouth at bedtime.   triamcinolone cream 0.5 % Commonly known as:  KENALOG Use as directed   Vitamin D (Ergocalciferol) 50000 units Caps capsule Commonly known as:  DRISDOL TAKE 1 CAPSULE BY MOUTH TWICE A WEEK       Allergies:  Allergies  Allergen Reactions  . Buprenorphine Hcl Rash  . Morphine And Related Rash    Past Medical History:  Diagnosis Date  . Arthritis   . Asthma   . CHF (congestive heart failure) ([place])   . Chronic headaches   . Constipation   . Diabetes mellitus without complication ([place])   . Hemorrhoids without complication   . Hypertension     Past Surgical History:  Procedure Laterality Date  . ABDOMINAL HYSTERECTOMY    . BACK SURGERY    . CHOLECYSTECTOMY    . KNEE SURGERY      Family History  Problem Relation Age of Onset  . Diabetes Son   . Hypertension Son   . Cancer Sister     breast  . Diabetes Daughter     plus 2 grandkids  . Stroke Son     2moage dx, has enlarged heart  . Heart disease Son     passed away    Social History:  reports that she has never smoked. She has never used smokeless tobacco. She reports that she does not drink alcohol or use drugs.    Review of Systems         Lipids: Has not been on treatment with a statin drug, levels are normal without statin drugs  Lab Results  Component Value Date   CHOL 186 07/03/2016   HDL 63.60 07/03/2016   LDLCALC 98 07/03/2016   TRIG 121.0 07/03/2016   CHOLHDL 3 07/03/2016       The blood pressure has been controlled with HCTZ, followed by PCP    History of anemia, she sees her PCP regularly:  Lab Results  Component Value Date   WBC 5.4 08/06/2016   HGB 11.1 (L) 08/06/2016     HCT 34.8 (L) 08/06/2016   MCV 89.0 08/06/2016   PLT 200 08/06/2016     NEUROPATHY: On 100 mg gabapentin At bedtime, taking it  regularly otherwise her legs will ache or tingle     Diabetic foot exam in 1/17    She has a history of a small goiter in the past, euthyroid  Lab Results  Component Value Date   TSH 1.62 11/15/2013    Physical Examination:  BP (!) 148/66   Pulse 66   Ht 5' 1"  (1.549 m)   Wt 215 lb 12.8 oz (97.9 kg)   SpO2 99%   BMI 40.78 kg/m      ASSESSMENT/ PLAN:   Diabetes type 2 with obesity, relatively mild and well controlled  See history of present illness for  discussion of current diabetes management, blood sugar patterns and problems identified  She appears to have fairly good control now considering her age A1c 6.3  Did not bring her monitor for download and she does not remember her readings She had taken metformin safely before and since her renal function is normal she can take at least 1000 mg a day, discussed that 500 mg once a day is not very effective  Her daughter is going to be involved in her care now including meal planning and medications Discussed checking blood sugars at different times by rotation,  no more than one today  Patient Instructions  Check blood sugars on waking up  2x weekly  Also check blood sugars about 2 hours after a meal and do this after different meals by rotation  Recommended blood sugar levels on waking up is 90-130 and about 2 hours after meal is 130-160  Please bring your blood sugar monitor to each visit, thank you     [facility] 08/20/2016, 8:19 PM            Patient ID: Angelica Benson, female   DOB: March 14, 1929, 83 y.o.   MRN: [id number]   Reason for Appointment : Followup of diabetes and other issues  History of Present Illness          Diagnosis: Type 2 diabetes mellitus, date of diagnosis: 1992       Past history: She is unclear about the date of her diagnosis and treatment history. At  onset she had symptoms of frequent urination, weakness and sleepiness. She has been on various oral hypoglycemic drugs since her diagnosis. Has been on metformin since at least 2006 and Januvia since 2007. Also previously had taken glyburide and Avandia Her A1c has generally been in the 5.9-6.4 range previously  Recent history:   She is on Januvia and her metformin was supposed to be increase back to twice a day but she is still taking it only once a day  She has had fairly consistent control, A1c 6.3 She was given a new glucose monitor on the last visit and she was probably getting falsely high readings previously Did not bring her monitor for download today She is not having any side effects from metformin but only taking it in the morning Her weight has leveled off  Oral hypoglycemic drugs the patient is taking are: Metformin 500 mg once a day, Januvia 128m      Side effects from medications have been: None  Glucose monitoring:  done once a day or less.   Glucometer: One Touch.       Blood Glucose readings not available today    Hypoglycemia:   none.   Self-care: The diet that the patient has been following is: tries to limit high-fat foods    Meals: 3 meals per day.  at breakfast has instant oatmeal and toast. At lunch will have sandwich and fruit; dinner is chicken, starch and vegetables; her snacks are fruit, peanut butter or cheese crackers      Exercise:    will walk around her living area Dietician visit: Most recent: 2000            Compliance with the medical regimen: Good  Weight history:  Wt Readings from Last 3 Encounters:  08/20/16 215 lb 12.8 oz (97.9 kg)  07/03/16 218 lb 6.4 oz (99.1 kg)  04/26/16 211 lb 6 oz (95.9 kg)   Glycemic control:   Lab Results  Component Value Date   HGBA1C 6.7 (H) 07/29/2016   HGBA1C 6.3 03/28/2016   HGBA1C 6.1 12/27/2015   Lab Results  Component Value Date   MICROALBUR <0.7 12/27/2015   LDLCALC 98 07/03/2016   CREATININE  1.02 (H) 08/06/2016      No visits with results within 1 Week(s) from this visit.  Latest known visit with results is:  Admission on 08/06/2016, Discharged on 08/06/2016  Component Date Value Ref Range Status  . Sodium 08/06/2016 140  135 - 145 mmol/L Final  . Potassium 08/06/2016 3.8  3.5 - 5.1 mmol/L Final  .  Chloride 08/06/2016 100* 101 - 111 mmol/L Final  . CO2 08/06/2016 26  22 - 32 mmol/L Final  . Glucose, Bld 08/06/2016 163* 65 - 99 mg/dL Final  . BUN 08/06/2016 11  6 - 20 mg/dL Final  . Creatinine, Ser 08/06/2016 1.02* 0.44 - 1.00 mg/dL Final  . Calcium 08/06/2016 9.3  8.9 - 10.3 mg/dL Final  . GFR calc non Af Amer 08/06/2016 49* >60 mL/min Final  . GFR calc Af Amer 08/06/2016 56* >60 mL/min Final   Comment: (NOTE) The eGFR has been calculated using the CKD EPI equation. This calculation has not been validated in all clinical situations. eGFR's persistently <60 mL/min signify possible Chronic Kidney Disease.   . Anion gap 08/06/2016 14  5 - 15 Final  . WBC 08/06/2016 5.4  4.0 - 10.5 K/uL Final  . RBC 08/06/2016 3.91  3.87 - 5.11 MIL/uL Final  . Hemoglobin 08/06/2016 11.1* 12.0 - 15.0 g/dL Final  . HCT 08/06/2016 34.8* 36.0 - 46.0 % Final  . MCV 08/06/2016 89.0  78.0 - 100.0 fL Final  . MCH 08/06/2016 28.4  26.0 - 34.0 pg Final  . MCHC 08/06/2016 31.9  30.0 - 36.0 g/dL Final  . RDW 08/06/2016 15.1  11.5 - 15.5 % Final  . Platelets 08/06/2016 200  150 - 400 K/uL Final  . D-Dimer, Quant 08/06/2016 2.03* 0.00 - 0.50 ug/mL-FEU Final   Comment: (NOTE) At the manufacturer cut-off of 0.50 ug/mL FEU, this assay has been documented to exclude PE with a sensitivity and negative predictive value of 97 to 99%.  At this time, this assay has not been approved by the FDA to exclude DVT/VTE. Results should be correlated with clinical presentation.   . Troponin I 08/06/2016 <0.03  <0.03 ng/mL Final  . B Natriuretic Peptide 08/06/2016 83.5  0.0 - 100.0 pg/mL Final    Allergies as  of 08/20/2016      Reactions   Buprenorphine Hcl Rash   Morphine And Related Rash      Medication List       Accurate as of 08/20/16  8:19 PM. Always use your most recent med list.          aspirin EC 81 MG tablet Take 81 mg by mouth daily.   doxycycline 100 MG capsule Commonly known as:  VIBRAMYCIN Take 1 capsule (100 mg total) by mouth 2 (two) times daily.   DULERA 200-5 MCG/ACT Aero Generic drug:  mometasone-formoterol Inhale 2 puffs into the lungs 2 (two) times daily as needed.   fluticasone 50 MCG/ACT nasal spray Commonly known as:  FLONASE Place 1 spray into both nostrils 2 (two) times daily.   gabapentin 100 MG capsule Commonly known as:  NEURONTIN Take 100 mg by mouth 2 (two) times daily.   glucose blood test strip Commonly known as:  ONETOUCH VERIO Used to check blood sugar once daily   hydrochlorothiazide 25 MG tablet Commonly known as:  HYDRODIURIL Take 12.5 mg by mouth daily.   hydrOXYzine 25 MG tablet Commonly known as:  ATARAX/VISTARIL Take 25 mg by mouth at bedtime as needed.   metFORMIN 500 MG tablet Commonly known as:  GLUCOPHAGE Take 500 mg by mouth 2 (two) times daily with a meal.   nitroGLYCERIN 0.4 MG SL tablet Commonly known as:  NITROSTAT Place 0.4 mg under the tongue every 5 (five) minutes as needed for chest pain.   ONE TOUCH ULTRA 2 w/Device Kit Use to check blood sugar daily dx code 250.00  ONETOUCH DELICA LANCETS 16X Misc USE AS DIRECTED TO CHECK BLOOD SUGAR EVERY DAY Dx code E11.9   potassium chloride SA 20 MEQ tablet Commonly known as:  K-DUR,KLOR-CON Take 20 mEq by mouth daily.   sitaGLIPtin 100 MG tablet Commonly known as:  JANUVIA Take 100 mg by mouth daily.   traZODone 50 MG tablet Commonly known as:  DESYREL Take 50 mg by mouth at bedtime.   triamcinolone cream 0.5 % Commonly known as:  KENALOG Use as directed   Vitamin D (Ergocalciferol) 50000 units Caps capsule Commonly known as:  DRISDOL TAKE 1 CAPSULE BY  MOUTH TWICE A WEEK       Allergies:  Allergies  Allergen Reactions  . Buprenorphine Hcl Rash  . Morphine And Related Rash    Past Medical History:  Diagnosis Date  . Arthritis   . Asthma   . CHF (congestive heart failure) ([place])   . Chronic headaches   . Constipation   . Diabetes mellitus without complication ([place])   . Hemorrhoids without complication   . Hypertension     Past Surgical History:  Procedure Laterality Date  . ABDOMINAL HYSTERECTOMY    . BACK SURGERY    . CHOLECYSTECTOMY    . KNEE SURGERY      Family History  Problem Relation Age of Onset  . Diabetes Son   . Hypertension Son   . Cancer Sister     breast  . Diabetes Daughter     plus 2 grandkids  . Stroke Son     27moage dx, has enlarged heart  . Heart disease Son     passed away    Social History:  reports that she has never smoked. She has never used smokeless tobacco. She reports that she does not drink alcohol or use drugs.    Review of Systems         Lipids: Has not been on treatment with a statin drug, levels are normal without statin drugs  Lab Results  Component Value Date   CHOL 186 07/03/2016   HDL 63.60 07/03/2016   LDLCALC 98 07/03/2016   TRIG 121.0 07/03/2016   CHOLHDL 3 07/03/2016       The blood pressure has been controlled with HCTZ, followed by PCP    History of anemia, she sees her PCP For this:  Lab Results  Component Value Date   WBC 5.4 08/06/2016   HGB 11.1 (L) 08/06/2016   HCT 34.8 (L) 08/06/2016   MCV 89.0 08/06/2016   PLT 200 08/06/2016     NEUROPATHY: On 100 mg gabapentin.  Without this her legs will ache or tingle     Diabetic foot exam in 1/17    She has a history of a small goiter in the past, euthyroid  Lab Results  Component Value Date   TSH 1.62 11/15/2013    Physical Examination:  BP (!) 148/66   Pulse 66   Ht 5' 1"  (1.549 m)   Wt 215 lb 12.8 oz (97.9 kg)   SpO2 99%   BMI 40.78 kg/m      ASSESSMENT/ PLAN:   Diabetes  type 2 with obesity, relatively mild and well controlled  See history of present illness for  discussion of current diabetes management, blood sugar patterns and problems identified  She is in the regimen of metformin 500 mg twice a day and Januvia She appears to have fairly good control now considering her age A1c slightly higher at 6.7, previously 6.3  Did not check her sugars lately and is having some issues with her using the test strips for the Verio monitor However even though she has strips for her ultra monitor she has not used this Lab glucose levels have been fairly good in the last month   Showed her how to use the test strip for the Verio monitor and discussed when to check her sugars, at least 3 times a week and let us know if they are rising  Follow-up in 3 months  Patient Instructions  Check blood sugars on waking up  2x weekly  Also check blood sugars about 2 hours after a meal and do this after different meals by rotation  Recommended blood sugar levels on waking up is 90-130 and about 2 hours after meal is 130-160  Please bring your blood sugar monitor to each visit, thank you     [facility] 08/20/2016, 8:19 PM

## 2016-08-20 NOTE — Patient Instructions (Signed)
Check blood sugars on waking up  2x weekly  Also check blood sugars about 2 hours after a meal and do this after different meals by rotation  Recommended blood sugar levels on waking up is 90-130 and about 2 hours after meal is 130-160  Please bring your blood sugar monitor to each visit, thank you  

## 2016-08-27 ENCOUNTER — Telehealth: Payer: Self-pay | Admitting: Family

## 2016-08-27 DIAGNOSIS — M792 Neuralgia and neuritis, unspecified: Secondary | ICD-10-CM

## 2016-08-27 DIAGNOSIS — Z23 Encounter for immunization: Secondary | ICD-10-CM
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
## 2016-08-27 NOTE — Telephone Encounter (Signed)
Reviewed below medications. Prilosec is not on current med list and metformin is on list but was not listed in requested meds. Last Vitamin D rx in [facility] was 2015 by Dr Dwyane Dee. Pt has f/u with PCP on 10/01/16.  Please advise?

## 2016-08-28 MED ORDER — HYDROCHLOROTHIAZIDE 25 MG PO TABS: 12.5000 mg | ORAL_TABLET | Freq: Every day | ORAL | 1 refills | Status: DC

## 2016-08-28 MED ORDER — GABAPENTIN 100 MG PO CAPS: 100.0000 mg | ORAL_CAPSULE | Freq: Two times a day (BID) | ORAL | 1 refills | Status: DC

## 2016-08-28 MED ORDER — HYDROXYZINE HCL 25 MG PO TABS: 25.0000 mg | ORAL_TABLET | Freq: Every evening | ORAL | 0 refills | Status: DC | PRN

## 2016-08-28 MED ORDER — SITAGLIPTIN PHOSPHATE 100 MG PO TABS
100.0000 mg | ORAL_TABLET | Freq: Every day | ORAL | 1 refills | Status: DC
Start: 2016-08-28 — End: 2017-03-18

## 2016-08-28 MED ORDER — DULERA 200-5 MCG/ACT IN AERO: 2.0000 | INHALATION_SPRAY | Freq: Two times a day (BID) | RESPIRATORY_TRACT | 1 refills | Status: DC | PRN

## 2016-08-28 MED ORDER — OMEPRAZOLE 20 MG PO CPDR: 20.0000 mg | DELAYED_RELEASE_CAPSULE | Freq: Every day | ORAL | 1 refills | Status: DC

## 2016-08-28 MED ORDER — TRAZODONE HCL 50 MG PO TABS: 50.0000 mg | ORAL_TABLET | Freq: Every day | ORAL | 1 refills | Status: DC

## 2016-08-28 MED ORDER — POTASSIUM CHLORIDE CRYS ER 20 MEQ PO TBCR
20.0000 meq | EXTENDED_RELEASE_TABLET | Freq: Every day | ORAL | 1 refills | Status: DC
Start: 2016-08-28 — End: 2017-05-02

## 2016-08-28 NOTE — Telephone Encounter (Signed)
Per 07/03/16 New Patient office visit note; Refills sent per [facility] refill protocol/SLS 05/16

## 2016-08-28 NOTE — Telephone Encounter (Signed)
Records indicate that she should be on Metformin. Metformin and Vitamin D are being prescribed by Dr. Dwyane Dee.  I will forward this message to Dr. Dwyane Dee.  OK to refill her other medications as requested.  Thanks.

## 2016-09-24 ENCOUNTER — Other Ambulatory Visit: Payer: Self-pay | Admitting: Family

## 2016-09-27 ENCOUNTER — Encounter: Payer: Self-pay | Admitting: Podiatry

## 2016-09-27 ENCOUNTER — Ambulatory Visit (INDEPENDENT_AMBULATORY_CARE_PROVIDER_SITE_OTHER): Payer: Medicare Other | Admitting: Podiatry

## 2016-09-27 DIAGNOSIS — R609 Edema, unspecified: Secondary | ICD-10-CM

## 2016-09-27 DIAGNOSIS — B351 Tinea unguium: Secondary | ICD-10-CM

## 2016-09-27 DIAGNOSIS — E1149 Type 2 diabetes mellitus with other diabetic neurological complication: Secondary | ICD-10-CM

## 2016-09-27 DIAGNOSIS — R52 Pain, unspecified: Secondary | ICD-10-CM

## 2016-09-27 DIAGNOSIS — M79676 Pain in unspecified toe(s): Secondary | ICD-10-CM

## 2016-09-27 NOTE — Progress Notes (Signed)
Patient ID: Angelica Benson, female   DOB: 04/05/1929, 83 y.o.   MRN: [id number] Complaint:  Visit Type: Patient returns to my office for continued preventative foot care services. Complaint: Patient states" my nails have grown long and thick and become painful to walk and wear shoes" Patient has been diagnosed with DM with neuropathy.. The patient presents for preventative foot care services. No changes to ROS  Podiatric Exam: Vascular: dorsalis pedis and posterior tibial pulses are palpable bilateral. Capillary return is immediate. Temperature gradient is WNL. Skin turgor WNL  Sensorium: Normal Semmes Weinstein monofilament test. Normal tactile sensation bilaterally. Nail Exam: Pt has thick disfigured discolored nails with subungual debris noted bilateral entire nail hallux through fifth toenails Ulcer Exam: There is no evidence of ulcer or pre-ulcerative changes or infection. Orthopedic Exam: Muscle tone and strength are WNL. No limitations in general ROM. No crepitus or effusions noted. Foot type and digits show no abnormalities. Bony prominences are unremarkable. Skin: No Porokeratosis. No infection or ulcers  Diagnosis:  Onychomycosis, , Pain in right toe, pain in left toes  Treatment & Plan Procedures and Treatment: Consent by patient was obtained for treatment procedures. The patient understood the discussion of treatment and procedures well. All questions were answered thoroughly reviewed. Debridement of mycotic and hypertrophic toenails, 1 through 5 bilateral and clearing of subungual debris. No ulceration, no infection noted.  Anklets dispensed for both ankles.  Return Visit-Office Procedure: Patient instructed to return to the office for a follow up visit 3 months for continued evaluation and treatment.  Gardiner Barefoot DPM

## 2016-10-01 ENCOUNTER — Ambulatory Visit: Payer: Medicare Other | Admitting: Family

## 2016-10-23 ENCOUNTER — Other Ambulatory Visit: Payer: Self-pay | Admitting: Family

## 2016-11-14 ENCOUNTER — Other Ambulatory Visit (INDEPENDENT_AMBULATORY_CARE_PROVIDER_SITE_OTHER): Payer: Medicare Other

## 2016-11-14 DIAGNOSIS — E1165 Type 2 diabetes mellitus with hyperglycemia: Secondary | ICD-10-CM

## 2016-11-14 DIAGNOSIS — E119 Type 2 diabetes mellitus without complications: Secondary | ICD-10-CM

## 2016-11-14 LAB — BASIC METABOLIC PANEL
BUN: 17 mg/dL (ref 6–23)
CHLORIDE: 98 meq/L (ref 96–112)
CO2: 28 mEq/L (ref 19–32)
Calcium: 9.3 mg/dL (ref 8.4–10.5)
Creatinine, Ser: 1.13 mg/dL (ref 0.40–1.20)
GFR: 58.69 mL/min — ABNORMAL LOW (ref >60.00)
GLUCOSE: 91 mg/dL (ref 70–99)
POTASSIUM: 4.2 meq/L (ref 3.5–5.1)
Sodium: 134 mEq/L — ABNORMAL LOW (ref 135–145)

## 2016-11-14 LAB — HEMOGLOBIN A1C: Hgb A1c MFr Bld: 6.5 % (ref 4.6–6.5)

## 2016-11-19 ENCOUNTER — Encounter: Payer: Self-pay | Admitting: Family

## 2016-11-19 ENCOUNTER — Ambulatory Visit (INDEPENDENT_AMBULATORY_CARE_PROVIDER_SITE_OTHER): Payer: Medicare Other | Admitting: Family

## 2016-11-19 VITALS — BP 120/60 | HR 61 | Temp 97.5°F | Ht 61.0 in | Wt 211.0 lb

## 2016-11-19 DIAGNOSIS — I10 Essential (primary) hypertension: Secondary | ICD-10-CM | POA: Diagnosis not present

## 2016-11-19 DIAGNOSIS — K649 Unspecified hemorrhoids: Secondary | ICD-10-CM

## 2016-11-19 DIAGNOSIS — Z8673 Personal history of transient ischemic attack (TIA), and cerebral infarction without residual deficits: Secondary | ICD-10-CM

## 2016-11-19 DIAGNOSIS — D649 Anemia, unspecified: Secondary | ICD-10-CM | POA: Diagnosis not present

## 2016-11-19 DIAGNOSIS — E119 Type 2 diabetes mellitus without complications: Secondary | ICD-10-CM | POA: Diagnosis not present

## 2016-11-19 DIAGNOSIS — K625 Hemorrhage of anus and rectum: Secondary | ICD-10-CM

## 2016-11-19 LAB — CBC WITH DIFFERENTIAL/PLATELET
BASOS PCT: 1 % (ref 0.0–3.0)
Basophils Absolute: 0.1 10*3/uL (ref 0.0–0.1)
EOS PCT: 5.7 % — AB (ref 0.0–5.0)
Eosinophils Absolute: 0.3 10*3/uL (ref 0.0–0.7)
HEMATOCRIT: 38.2 % (ref 36.0–46.0)
HEMOGLOBIN: 12.1 g/dL (ref 12.0–15.0)
LYMPHS PCT: 38.8 % (ref 12.0–46.0)
Lymphs Abs: 2.2 10*3/uL (ref 0.7–4.0)
MCHC: 31.6 g/dL (ref 30.0–36.0)
MCV: 89.1 fl (ref 78.0–100.0)
MONO ABS: 0.4 10*3/uL (ref 0.1–1.0)
MONOS PCT: 7.5 % (ref 3.0–12.0)
Neutro Abs: 2.6 10*3/uL (ref 1.4–7.7)
Neutrophils Relative %: 47 % (ref 43.0–77.0)
Platelets: 204 10*3/uL (ref 150.0–400.0)
RBC: 4.29 Mil/uL (ref 3.87–5.11)
RDW: 15.7 % — AB (ref 11.5–15.5)
WBC: 5.6 10*3/uL (ref 4.0–10.5)

## 2016-11-19 MED ORDER — ATORVASTATIN CALCIUM 10 MG PO TABS: 10.0000 mg | ORAL_TABLET | Freq: Every day | ORAL | 1 refills | Status: DC

## 2016-11-19 NOTE — Progress Notes (Signed)
Subjective:    Patient ID: Angelica Benson, female    DOB: [birth year], 83 y.o.   MRN: [id number]  HPI  Angelica Benson is an 83 year old female who presents today for follow-up. She sees endo for diabetes.    She was initially here to establish care back in March of this year.  Last visit she complained of mild rectal bleeding. It was felt that it was likely due to hemorrhoids. She declined further workup. We discussed adding lots of fresh fruits and veggies as well as MiraLAX as needed. Recommended Preparation H as needed for her hemorrhoids. She was noted to be mildly anemic at that time.She reports resolution of bleeding. Reports hemorrhoids are no longer bothering her. She has been having regular bowel movements and working on diet.   Lab Results  Component Value Date   WBC 5.4 08/06/2016   HGB 11.1 (L) 08/06/2016   HCT 34.8 (L) 08/06/2016   MCV 89.0 08/06/2016   PLT 200 08/06/2016   History of TIA-she is maintained on aspirin 81 mg by mouth once daily. Lab Results  Component Value Date   CHOL 186 07/03/2016   HDL 63.60 07/03/2016   LDLCALC 98 07/03/2016   TRIG 121.0 07/03/2016   CHOLHDL 3 07/03/2016    Hypertension-she is maintained on hydrochlorothiazide 25 mg by mouth daily BP Readings from Last 3 Encounters:  11/19/16 120/60  08/20/16 (!) 148/66  08/06/16 (!) 140/59   Denies personal history of goiter but has family history of goiter.   Review of Systems See HPI  Past Medical History:  Diagnosis Date  . Arthritis   . Asthma   . CHF (congestive heart failure) ([place])   . Chronic headaches   . Constipation   . Diabetes mellitus without complication ([place])   . Hemorrhoids without complication   . Hypertension      Social History   Social History  . Marital status: Single    Spouse name: N/A  . Number of children: N/A  . Years of education: N/A   Occupational History  . Not on file.   Social History Main Topics  . Smoking status: Never Smoker  .  Smokeless tobacco: Never Used  . Alcohol use No  . Drug use: No  . Sexual activity: Not on file   Other Topics Concern  . Not on file   Social History Narrative   Lives at home by herself, aide in the am.     Has 4 daughters and 3 living sons (one son died).    Uses walker.     Divorced at age 14    Past Surgical History:  Procedure Laterality Date  . ABDOMINAL HYSTERECTOMY    . BACK SURGERY    . CHOLECYSTECTOMY    . KNEE SURGERY      Family History  Problem Relation Age of Onset  . Diabetes Son   . Hypertension Son   . Cancer Sister        breast  . Diabetes Daughter        plus 2 grandkids  . Stroke Son        59moage dx, has enlarged heart  . Heart disease Son        passed away    Allergies  Allergen Reactions  . Buprenorphine Hcl Rash  . Morphine And Related Rash    Current Outpatient Prescriptions on File Prior to Visit  Medication Sig Dispense Refill  . aspirin EC 81 MG tablet Take  81 mg by mouth daily.    . Blood Glucose Monitoring Suppl (ONE TOUCH ULTRA 2) w/Device KIT Use to check blood sugar daily dx code 250.00 1 each 0  . doxycycline (VIBRAMYCIN) 100 MG capsule Take 1 capsule (100 mg total) by mouth 2 (two) times daily. 14 capsule 0  . DULERA 200-5 MCG/ACT AERO Inhale 2 puffs into the lungs 2 (two) times daily as needed. 1 Inhaler 1  . fluticasone (FLONASE) 50 MCG/ACT nasal spray Place 1 spray into both nostrils 2 (two) times daily.    Marland Kitchen gabapentin (NEURONTIN) 100 MG capsule Take 1 capsule (100 mg total) by mouth 2 (two) times daily. 60 capsule 1  . glucose blood (ONETOUCH VERIO) test strip Used to check blood sugar once daily 100 each 4  . hydrochlorothiazide (HYDRODIURIL) 25 MG tablet Take 0.5 tablets (12.5 mg total) by mouth daily. 15 tablet 1  . hydrOXYzine (ATARAX/VISTARIL) 25 MG tablet Take 1 tablet (25 mg total) by mouth at bedtime as needed. 30 tablet 0  . metFORMIN (GLUCOPHAGE) 500 MG tablet TAKE 1 TABLET BY MOUTH TWICE DAILY 60 tablet 5    . nitroGLYCERIN (NITROSTAT) 0.4 MG SL tablet Place 0.4 mg under the tongue every 5 (five) minutes as needed for chest pain.    Marland Kitchen omeprazole (PRILOSEC) 20 MG capsule Take 1 capsule (20 mg total) by mouth daily. 30 capsule 1  . ONETOUCH DELICA LANCETS 54D MISC USE AS DIRECTED TO CHECK BLOOD SUGAR EVERY DAY Dx code E11.9 100 each 1  . potassium chloride SA (K-DUR,KLOR-CON) 20 MEQ tablet Take 1 tablet (20 mEq total) by mouth daily. 30 tablet 1  . sitaGLIPtin (JANUVIA) 100 MG tablet Take 1 tablet (100 mg total) by mouth daily. 30 tablet 1  . traZODone (DESYREL) 50 MG tablet Take 1 tablet (50 mg total) by mouth at bedtime. 30 tablet 1  . triamcinolone cream (KENALOG) 0.5 % Use as directed    . Vitamin D, Ergocalciferol, (DRISDOL) 50000 UNITS CAPS capsule TAKE 1 CAPSULE BY MOUTH TWICE A WEEK (Patient taking differently: TAKE 1 CAPSULE BY MOUTH ONCE WEEKLY ON SATURDAY) 8 capsule 2   No current facility-administered medications on file prior to visit.     BP 120/60 (BP Location: Left Arm, Patient Position: Sitting, Cuff Size: Normal)   Pulse 61   Temp (!) 97.5 F (36.4 C) (Oral)   Ht 5' 1"  (1.549 m)   Wt 211 lb (95.7 kg)   SpO2 95%   BMI 39.87 kg/m       Objective:   Physical Exam  Constitutional: She is oriented to person, place, and time. She appears well-developed and well-nourished.  Cardiovascular: Normal rate, regular rhythm and normal heart sounds.   No murmur heard. Pulmonary/Chest: Effort normal and breath sounds normal. No respiratory distress. She has no wheezes.  Neurological: She is alert and oriented to person, place, and time.  Psychiatric: She has a normal mood and affect. Her behavior is normal. Judgment and thought content normal.          Assessment & Plan:  Hemorrhoid/rectal bleeding-clinically resolved.  Mild anemia-follow up hemoglobin is performed and is within normal limits.  Hypertension-blood pressure is stable on current medications continue  same.  History of TIA-she is maintained on aspirin. We discussed LDL goal less than 70. She is agreeable to a low-dose statin. Will initiate atorvastatin.  Diabetes type 2 this is currently being managed by endocrinology.  Lab Results  Component Value Date   HGBA1C 6.5 11/14/2016  HGBA1C 6.7 (H) 07/29/2016   HGBA1C 6.3 03/28/2016   Lab Results  Component Value Date   MICROALBUR <0.7 12/27/2015   LDLCALC 98 07/03/2016   CREATININE 1.13 11/14/2016

## 2016-11-19 NOTE — Progress Notes (Signed)
Pre visit review using our clinic review tool, if applicable. No additional management support is needed unless otherwise documented below in the visit note. 

## 2016-11-19 NOTE — Patient Instructions (Signed)
Please add atorvastatin (lipitor) once daily in the evening. Complete lab work prior to leaving.

## 2016-11-21 ENCOUNTER — Ambulatory Visit: Payer: Medicare Other | Admitting: Endocrinology

## 2016-11-25 ENCOUNTER — Ambulatory Visit (INDEPENDENT_AMBULATORY_CARE_PROVIDER_SITE_OTHER): Payer: Medicare Other | Admitting: Endocrinology

## 2016-11-25 ENCOUNTER — Other Ambulatory Visit: Payer: Self-pay

## 2016-11-25 ENCOUNTER — Encounter: Payer: Self-pay | Admitting: Endocrinology

## 2016-11-25 VITALS — BP 124/74 | HR 78 | Ht 61.0 in | Wt 207.0 lb

## 2016-11-25 DIAGNOSIS — E049 Nontoxic goiter, unspecified: Secondary | ICD-10-CM | POA: Diagnosis not present

## 2016-11-25 DIAGNOSIS — E119 Type 2 diabetes mellitus without complications: Secondary | ICD-10-CM

## 2016-11-25 MED ORDER — GLUCOSE BLOOD VI STRP: ORAL_STRIP | 4 refills | Status: DC

## 2016-11-25 MED ORDER — ONETOUCH DELICA LANCETS 33G MISC: 1 refills | Status: DC

## 2016-11-25 NOTE — Progress Notes (Signed)
Patient ID: Angelica Benson, female   DOB: 07/16/1928, 83 y.o.   MRN: [id number]   Reason for Appointment : Followup of diabetes and other issues  History of Present Illness          Diagnosis: Type 2 diabetes mellitus, date of diagnosis: 1992       Past history: She is unclear about the date of her diagnosis and treatment history. At onset she had symptoms of frequent urination, weakness and sleepiness. She has been on various oral hypoglycemic drugs since her diagnosis. Has been on metformin since at least 2006 and Januvia since 2007. Also previously had taken glyburide and Avandia Her A1c has generally been in the 5.9-6.4 range previously  Recent history:   Oral hypoglycemic drugs the patient is taking are: Metformin 500 mg 2x a day, Januvia 11m    She has had fairly consistent control with A1c under 7% This is now 6.5, previously 6.7  Current management:  She was told to watch her portions on the last visit because of increasing weight and relatively high blood sugars  Taking metformin twice a day without side effects  She is still not able to check her blood sugars herself despite instructions on the last visit   increase her metformin to twice a day on the last visit  Lab glucose 91     Side effects from medications have been: None  Glucose monitoring:  not done now.   Glucometer: One Touch.       Blood Glucose readings not available   Hypoglycemia:   none.   Self-care: The diet that the patient has been following is: tries to limit high-fat foods    Meals: 3 meals per day.  at breakfast has instant oatmeal and toast. At lunch will have sandwich and fruit; dinner is chicken, starch and vegetables; her snacks are fruit, peanut butter or cheese crackers      Exercise:    will walk around her living area, uses a walker Dietician visit: Most recent: 2000            Compliance with the medical regimen: Fair  Weight history:  Wt Readings from Last 3  Encounters:  11/25/16 207 lb (93.9 kg)  11/19/16 211 lb (95.7 kg)  08/20/16 215 lb 12.8 oz (97.9 kg)   Glycemic control:   Lab Results  Component Value Date   HGBA1C 6.5 11/14/2016   HGBA1C 6.7 (H) 07/29/2016   HGBA1C 6.3 03/28/2016   Lab Results  Component Value Date   MICROALBUR <0.7 12/27/2015   [id number] 07/03/2016   CREATININE 1.13 11/14/2016      Office Visit on 11/19/2016  Component Date Value Ref Range Status  . WBC 11/19/2016 5.6  4.0 - 10.5 K/uL Final  . RBC 11/19/2016 4.29  3.87 - 5.11 Mil/uL Final  . Hemoglobin 11/19/2016 12.1  12.0 - 15.0 g/dL Final  . HCT 11/19/2016 38.2  36.0 - 46.0 % Final  . MCV 11/19/2016 89.1  78.0 - 100.0 fl Final  . MCHC 11/19/2016 31.6  30.0 - 36.0 g/dL Final  . RDW 11/19/2016 15.7* 11.5 - 15.5 % Final  . Platelets 11/19/2016 204.0  150.0 - 400.0 K/uL Final  . Neutrophils Relative % 11/19/2016 47.0  43.0 - 77.0 % Final  . Lymphocytes Relative 11/19/2016 38.8  12.0 - 46.0 % Final  . Monocytes Relative 11/19/2016 7.5  3.0 - 12.0 % Final  . Eosinophils Relative 11/19/2016 5.7* 0.0 - 5.0 % Final  .  Basophils Relative 11/19/2016 1.0  0.0 - 3.0 % Final  . Neutro Abs 11/19/2016 2.6  1.4 - 7.7 K/uL Final  . Lymphs Abs 11/19/2016 2.2  0.7 - 4.0 K/uL Final  . Monocytes Absolute 11/19/2016 0.4  0.1 - 1.0 K/uL Final  . Eosinophils Absolute 11/19/2016 0.3  0.0 - 0.7 K/uL Final  . Basophils Absolute 11/19/2016 0.1  0.0 - 0.1 K/uL Final    Allergies as of 11/25/2016      Reactions   Buprenorphine Hcl Rash   Morphine And Related Rash      Medication List       Accurate as of 11/25/16  3:52 PM. Always use your most recent med list.          aspirin EC 81 MG tablet Take 81 mg by mouth daily.   atorvastatin 10 MG tablet Commonly known as:  LIPITOR Take 1 tablet (10 mg total) by mouth daily.   doxycycline 100 MG capsule Commonly known as:  VIBRAMYCIN Take 1 capsule (100 mg total) by mouth 2 (two) times daily.   DULERA 200-5  MCG/ACT Aero Generic drug:  mometasone-formoterol Inhale 2 puffs into the lungs 2 (two) times daily as needed.   fluticasone 50 MCG/ACT nasal spray Commonly known as:  FLONASE Place 1 spray into both nostrils 2 (two) times daily.   gabapentin 100 MG capsule Commonly known as:  NEURONTIN Take 1 capsule (100 mg total) by mouth 2 (two) times daily.   glucose blood test strip Commonly known as:  ONETOUCH VERIO Used to check blood sugar once daily   hydrochlorothiazide 25 MG tablet Commonly known as:  HYDRODIURIL Take 0.5 tablets (12.5 mg total) by mouth daily.   hydrOXYzine 25 MG tablet Commonly known as:  ATARAX/VISTARIL Take 1 tablet (25 mg total) by mouth at bedtime as needed.   metFORMIN 500 MG tablet Commonly known as:  GLUCOPHAGE TAKE 1 TABLET BY MOUTH TWICE DAILY   nitroGLYCERIN 0.4 MG SL tablet Commonly known as:  NITROSTAT Place 0.4 mg under the tongue every 5 (five) minutes as needed for chest pain.   omeprazole 20 MG capsule Commonly known as:  PRILOSEC Take 1 capsule (20 mg total) by mouth daily.   ONE TOUCH ULTRA 2 w/Device Kit Use to check blood sugar daily dx code 546.50   ONETOUCH DELICA LANCETS 35W Misc USE AS DIRECTED TO CHECK BLOOD SUGAR EVERY DAY Dx code E11.9   potassium chloride SA 20 MEQ tablet Commonly known as:  K-DUR,KLOR-CON Take 1 tablet (20 mEq total) by mouth daily.   sitaGLIPtin 100 MG tablet Commonly known as:  JANUVIA Take 1 tablet (100 mg total) by mouth daily.   traZODone 50 MG tablet Commonly known as:  DESYREL Take 1 tablet (50 mg total) by mouth at bedtime.   triamcinolone cream 0.5 % Commonly known as:  KENALOG Use as directed   Vitamin D (Ergocalciferol) 50000 units Caps capsule Commonly known as:  DRISDOL TAKE 1 CAPSULE BY MOUTH TWICE A WEEK       Allergies:  Allergies  Allergen Reactions  . Buprenorphine Hcl Rash  . Morphine And Related Rash    Past Medical History:  Diagnosis Date  . Arthritis   . Asthma    . CHF (congestive heart failure) ([place])   . Chronic headaches   . Constipation   . Diabetes mellitus without complication ([place])   . Hemorrhoids without complication   . Hypertension     Past Surgical History:  Procedure Laterality Date  .  ABDOMINAL HYSTERECTOMY    . BACK SURGERY    . CHOLECYSTECTOMY    . KNEE SURGERY      Family History  Problem Relation Age of Onset  . Diabetes Son   . Hypertension Son   . Cancer Sister        breast  . Diabetes Daughter        plus 2 grandkids  . Stroke Son        66moage dx, has enlarged heart  . Heart disease Son        passed away    Social History:  reports that she has never smoked. She has never used smokeless tobacco. She reports that she does not drink alcohol or use drugs.    Review of Systems         Lipids: Has not been on treatment with a statin drug, levels are normal without statin drugs  Lab Results  Component Value Date   CHOL 186 07/03/2016   HDL 63.60 07/03/2016   LDLCALC 98 07/03/2016   TRIG 121.0 07/03/2016   CHOLHDL 3 07/03/2016       The blood pressure has been controlled with HCTZ, followed by PCP    History of anemia, she sees her PCP regularly:  Lab Results  Component Value Date   WBC 5.6 11/19/2016   HGB 12.1 11/19/2016   HCT 38.2 11/19/2016   MCV 89.1 11/19/2016   PLT 204.0 11/19/2016     NEUROPATHY: On 100 mg gabapentin At bedtime, taking it  regularly otherwise her legs will ache or tingle     Diabetic foot exam in 1/17    She has a history of a small goiter in the past, euthyroid  Lab Results  Component Value Date   TSH 1.62 11/15/2013    Physical Examination:  BP 124/74   Pulse 78   Ht 5' 1"  (1.549 m)   Wt 207 lb (93.9 kg)   SpO2 97%   BMI 39.11 kg/m      ASSESSMENT/ PLAN:   Diabetes type 2 with obesity, relatively mild and well controlled  See history of present illness for  discussion of current diabetes management, blood sugar patterns and problems  identified  She appears to have fairly good control now considering her age A1c 6.3  Did not bring her monitor for download and she does not remember her readings She had taken metformin safely before and since her renal function is normal she can take at least 1000 mg a day, discussed that 500 mg once a day is not very effective  Her daughter is going to be involved in her care now including meal planning and medications Discussed checking blood sugars at different times by rotation, no more than one today  There are no Patient Instructions on file for this visit.   [facility]/2018, 3:52 PM            Patient ID: OMALLIE GIAMBRA female   DOB: [birth year] 83y.o.   MRN: [id number]  Reason for Appointment : Followup of diabetes and other issues  History of Present Illness          Diagnosis: Type 2 diabetes mellitus, date of diagnosis: 1992       Past history: She is unclear about the date of her diagnosis and treatment history. At onset she had symptoms of frequent urination, weakness and sleepiness. She has been on various oral hypoglycemic drugs since her diagnosis. Has been on metformin  since at least 2006 and Januvia since 2007. Also previously had taken glyburide and Avandia Her A1c has generally been in the 5.9-6.4 range previously  Recent history:   She is on Januvia and her metformin was supposed to be increase back to twice a day but she is still taking it only once a day  She has had fairly consistent control, A1c 6.3  She was given a new glucose monitor on the last visit and she was probably getting falsely high readings previously Did not bring her monitor for download today She is not having any side effects from metformin but only taking it in the morning Her weight has leveled off  Oral hypoglycemic drugs the patient is taking are: Metformin 500 mg once a day, Januvia 174m      Side effects from medications have been: None  Glucose monitoring:  done  once a day or less.   Glucometer: One Touch.       Blood Glucose readings not available today    Hypoglycemia:   none.   Self-care: The diet that the patient has been following is: tries to limit high-fat foods    Meals: 3 meals per day.  at breakfast has oatmeal and toast. At lunch will have sandwich and fruit; dinner is chicken, starch and vegetables; her snacks are fruit, peanut butter or cheese crackers      Exercise:    will walk around her living area Dietician visit: Most recent: 2000            Compliance with the medical regimen: Good  Weight history:  Wt Readings from Last 3 Encounters:  11/25/16 207 lb (93.9 kg)  11/19/16 211 lb (95.7 kg)  08/20/16 215 lb 12.8 oz (97.9 kg)   Glycemic control:   Lab Results  Component Value Date   HGBA1C 6.5 11/14/2016   HGBA1C 6.7 (H) 07/29/2016   HGBA1C 6.3 03/28/2016   Lab Results  Component Value Date   MICROALBUR <0.7 12/27/2015   [id number] 07/03/2016   CREATININE 1.13 11/14/2016      Office Visit on 11/19/2016  Component Date Value Ref Range Status  . WBC 11/19/2016 5.6  4.0 - 10.5 K/uL Final  . RBC 11/19/2016 4.29  3.87 - 5.11 Mil/uL Final  . Hemoglobin 11/19/2016 12.1  12.0 - 15.0 g/dL Final  . HCT 11/19/2016 38.2  36.0 - 46.0 % Final  . MCV 11/19/2016 89.1  78.0 - 100.0 fl Final  . MCHC 11/19/2016 31.6  30.0 - 36.0 g/dL Final  . RDW 11/19/2016 15.7* 11.5 - 15.5 % Final  . Platelets 11/19/2016 204.0  150.0 - 400.0 K/uL Final  . Neutrophils Relative % 11/19/2016 47.0  43.0 - 77.0 % Final  . Lymphocytes Relative 11/19/2016 38.8  12.0 - 46.0 % Final  . Monocytes Relative 11/19/2016 7.5  3.0 - 12.0 % Final  . Eosinophils Relative 11/19/2016 5.7* 0.0 - 5.0 % Final  . Basophils Relative 11/19/2016 1.0  0.0 - 3.0 % Final  . Neutro Abs 11/19/2016 2.6  1.4 - 7.7 K/uL Final  . Lymphs Abs 11/19/2016 2.2  0.7 - 4.0 K/uL Final  . Monocytes Absolute 11/19/2016 0.4  0.1 - 1.0 K/uL Final  . Eosinophils Absolute 11/19/2016  0.3  0.0 - 0.7 K/uL Final  . Basophils Absolute 11/19/2016 0.1  0.0 - 0.1 K/uL Final    Allergies as of 11/25/2016      Reactions   Buprenorphine Hcl Rash   Morphine And Related Rash  Medication List       Accurate as of 11/25/16  3:52 PM. Always use your most recent med list.          aspirin EC 81 MG tablet Take 81 mg by mouth daily.   atorvastatin 10 MG tablet Commonly known as:  LIPITOR Take 1 tablet (10 mg total) by mouth daily.   doxycycline 100 MG capsule Commonly known as:  VIBRAMYCIN Take 1 capsule (100 mg total) by mouth 2 (two) times daily.   DULERA 200-5 MCG/ACT Aero Generic drug:  mometasone-formoterol Inhale 2 puffs into the lungs 2 (two) times daily as needed.   fluticasone 50 MCG/ACT nasal spray Commonly known as:  FLONASE Place 1 spray into both nostrils 2 (two) times daily.   gabapentin 100 MG capsule Commonly known as:  NEURONTIN Take 1 capsule (100 mg total) by mouth 2 (two) times daily.   glucose blood test strip Commonly known as:  ONETOUCH VERIO Used to check blood sugar once daily   hydrochlorothiazide 25 MG tablet Commonly known as:  HYDRODIURIL Take 0.5 tablets (12.5 mg total) by mouth daily.   hydrOXYzine 25 MG tablet Commonly known as:  ATARAX/VISTARIL Take 1 tablet (25 mg total) by mouth at bedtime as needed.   metFORMIN 500 MG tablet Commonly known as:  GLUCOPHAGE TAKE 1 TABLET BY MOUTH TWICE DAILY   nitroGLYCERIN 0.4 MG SL tablet Commonly known as:  NITROSTAT Place 0.4 mg under the tongue every 5 (five) minutes as needed for chest pain.   omeprazole 20 MG capsule Commonly known as:  PRILOSEC Take 1 capsule (20 mg total) by mouth daily.   ONE TOUCH ULTRA 2 w/Device Kit Use to check blood sugar daily dx code 220.25   ONETOUCH DELICA LANCETS 42H Misc USE AS DIRECTED TO CHECK BLOOD SUGAR EVERY DAY Dx code E11.9   potassium chloride SA 20 MEQ tablet Commonly known as:  K-DUR,KLOR-CON Take 1 tablet (20 mEq total) by  mouth daily.   sitaGLIPtin 100 MG tablet Commonly known as:  JANUVIA Take 1 tablet (100 mg total) by mouth daily.   traZODone 50 MG tablet Commonly known as:  DESYREL Take 1 tablet (50 mg total) by mouth at bedtime.   triamcinolone cream 0.5 % Commonly known as:  KENALOG Use as directed   Vitamin D (Ergocalciferol) 50000 units Caps capsule Commonly known as:  DRISDOL TAKE 1 CAPSULE BY MOUTH TWICE A WEEK       Allergies:  Allergies  Allergen Reactions  . Buprenorphine Hcl Rash  . Morphine And Related Rash    Past Medical History:  Diagnosis Date  . Arthritis   . Asthma   . CHF (congestive heart failure) ([place])   . Chronic headaches   . Constipation   . Diabetes mellitus without complication ([place])   . Hemorrhoids without complication   . Hypertension     Past Surgical History:  Procedure Laterality Date  . ABDOMINAL HYSTERECTOMY    . BACK SURGERY    . CHOLECYSTECTOMY    . KNEE SURGERY      Family History  Problem Relation Age of Onset  . Diabetes Son   . Hypertension Son   . Cancer Sister        breast  . Diabetes Daughter        plus 2 grandkids  . Stroke Son        19moage dx, has enlarged heart  . Heart disease Son        passed  away    Social History:  reports that she has never smoked. She has never used smokeless tobacco. She reports that she does not drink alcohol or use drugs.    Review of Systems         Lipids: Has not been on treatment with a statin drug, levels are normal without statin drugs  Lab Results  Component Value Date   CHOL 186 07/03/2016   HDL 63.60 07/03/2016   LDLCALC 98 07/03/2016   TRIG 121.0 07/03/2016   CHOLHDL 3 07/03/2016       The blood pressure has been controlled with HCTZ, followed by PCP  Minimal decrease in sodium as a result of HCTZ  Lab Results  Component Value Date   CREATININE 1.13 11/14/2016   BUN 17 11/14/2016   NA 134 (L) 11/14/2016   K 4.2 11/14/2016   CL 98 11/14/2016   CO2 28  11/14/2016       History of anemia, she sees her PCP For this:  Lab Results  Component Value Date   WBC 5.6 11/19/2016   HGB 12.1 11/19/2016   HCT 38.2 11/19/2016   MCV 89.1 11/19/2016   PLT 204.0 11/19/2016     NEUROPATHY: On 100 mg gabapentin.  Without this her legs will ache or tingle    Diabetic foot exam in 1/17    She has a history of a small goiter in the past, euthyroid  Lab Results  Component Value Date   TSH 1.62 11/15/2013    Physical Examination:  BP 124/74   Pulse 78   Ht 5' 1"  (1.549 m)   Wt 207 lb (93.9 kg)   SpO2 97%   BMI 39.11 kg/m   Thyroid enlargement about 1-1/2 times normal on the right side, smooth line left side not palpable Biceps reflexes normal    ASSESSMENT/ PLAN:   Diabetes type 2 with obesity, relatively mild and well controlled  See history of present illness for  discussion of current diabetes management, blood sugar patterns and problems identified  She is in the regimen of metformin 500 mg twice a day and Januvia  She appears to have fairly good control Consistently with A1c again below 7 A1c is excellent now at 6.5 She has not gained any further weight and is trying to be better with diet  She will get some assistance to help her check her sugars and this will be started, new prescription sent Discussed checking blood sugars either fasting or after meals and let us know if they are trending unusually high  History of goiter will need follow-up TSH on the next visit  Follow-up in 4 months  There are no Patient Instructions on file for this visit.   Braelin Costlow 11/25/2016, 3:52 PM

## 2016-12-04 ENCOUNTER — Telehealth: Payer: Self-pay | Admitting: Endocrinology

## 2016-12-04 ENCOUNTER — Other Ambulatory Visit: Payer: Self-pay

## 2016-12-04 MED ORDER — GLUCOSE BLOOD VI STRP: ORAL_STRIP | 4 refills | Status: DC

## 2016-12-04 NOTE — Telephone Encounter (Signed)
Called patients daughter Vita Barley at [phone] and she stated that the pharmacy needs the diagnosis code and I have resubmitted that prescription for patient test strips and if she has any trouble she can call us back.

## 2016-12-04 NOTE — Telephone Encounter (Signed)
MEDICATION:   glucose blood (ONETOUCH VERIO) test strip   PHARMACY:  [place], [place] - [address] [phone] (Phone) [phone] (Fax)   IS THIS A 90 DAY SUPPLY : "does not know"  IS PATIENT OUT OF MEDICATION: yes  IF NOT; HOW MUCH IS LEFT: n/a  LAST APPOINTMENT DATE:11/25/16  NEXT APPOINTMENT DATE:03/27/17  OTHER COMMENTS: [name] is not sure if a PA is needed, call pharmacy to verify.   **Let patient know to contact pharmacy at the end of the day to make sure medication is ready. **  ** Please notify patient to allow 48-72 hours to process**  **Encourage patient to contact the pharmacy for refills or they can request refills through [facility]**

## 2016-12-05 ENCOUNTER — Other Ambulatory Visit: Payer: Self-pay

## 2016-12-05 MED ORDER — GLUCOSE BLOOD VI STRP: ORAL_STRIP | 4 refills | Status: DC

## 2016-12-05 NOTE — Telephone Encounter (Signed)
Patient daughter called in upset regarding the strips. I spoke with daughter and explained we had sent it to the pharmacy as an escribe. She will check with pharmacy in just a few to see if they received it, if not I will print and she can come pick up the RX herself.

## 2016-12-06 ENCOUNTER — Other Ambulatory Visit: Payer: Self-pay

## 2016-12-06 MED ORDER — GLUCOSE BLOOD VI STRP: ORAL_STRIP | 4 refills | Status: DC

## 2016-12-06 NOTE — Telephone Encounter (Signed)
Angelica Benson called in to advise [facility] get [facility] RX sent. She wants to move the script to another location.   [facility] on [address]   Please call her to advise it has been sent.

## 2016-12-06 NOTE — Telephone Encounter (Signed)
Called daughter Vita Barley and let her know that I have sent over a prescription for patients test strips to the [facility] on [address] and [address].

## 2016-12-24 ENCOUNTER — Ambulatory Visit (INDEPENDENT_AMBULATORY_CARE_PROVIDER_SITE_OTHER): Payer: Medicare Other | Admitting: Podiatry

## 2016-12-24 ENCOUNTER — Encounter: Payer: Self-pay | Admitting: Podiatry

## 2016-12-24 DIAGNOSIS — M79675 Pain in left toe(s): Secondary | ICD-10-CM

## 2016-12-24 DIAGNOSIS — B351 Tinea unguium: Secondary | ICD-10-CM

## 2016-12-24 DIAGNOSIS — E119 Type 2 diabetes mellitus without complications: Secondary | ICD-10-CM | POA: Diagnosis not present

## 2016-12-24 DIAGNOSIS — M79674 Pain in right toe(s): Secondary | ICD-10-CM | POA: Diagnosis not present

## 2016-12-24 NOTE — Progress Notes (Signed)
Patient ID: Angelica Benson, female   DOB: 1929/04/05, 83 y.o.   MRN: [id number] Complaint:  Visit Type: Patient returns to my office for continued preventative foot care services. Complaint: Patient states" my nails have grown long and thick and become painful to walk and wear shoes" Patient has been diagnosed with DM with neuropathy.. The patient presents for preventative foot care services. No changes to ROS  Podiatric Exam: Vascular: dorsalis pedis and posterior tibial pulses are palpable bilateral. Capillary return is immediate. Temperature gradient is WNL. Skin turgor WNL  Sensorium: Diminished  Semmes Weinstein monofilament test. Normal tactile sensation bilaterally. Nail Exam: Pt has thick disfigured discolored nails with subungual debris noted bilateral entire nail hallux through fifth toenails Ulcer Exam: There is no evidence of ulcer or pre-ulcerative changes or infection. Orthopedic Exam: Muscle tone and strength are WNL. No limitations in general ROM. No crepitus or effusions noted. Foot type and digits show no abnormalities. Bony prominences are unremarkable. Skin: No Porokeratosis. No infection or ulcers  Diagnosis:  Onychomycosis, , Pain in right toe, pain in left toes  Treatment & Plan Procedures and Treatment: Consent by patient was obtained for treatment procedures. The patient understood the discussion of treatment and procedures well. All questions were answered thoroughly reviewed. Debridement of mycotic and hypertrophic toenails, 1 through 5 bilateral and clearing of subungual debris. No ulceration, no infection noted.   Return Visit-Office Procedure: Patient instructed to return to the office for a follow up visit 3 months for continued evaluation and treatment.  Gardiner Barefoot DPM

## 2017-01-07 IMAGING — CT CT HEAD W/O CM
2 series · 15 of 30 positions shown, 19 images · non-contrast
Comparison: February 07, 2013

CLINICAL DATA: Left upper extremity numbness and weakness

EXAM:
CT HEAD WITHOUT CONTRAST
TECHNIQUE: Contiguous axial images were obtained from the base of the skull
through the vertex without intravenous contrast.

[Series 201: head w/o, idose (1) · axial · non-contrast · 0.43mm/px · z∈[+7,+137]mm · 13 of 32 slices shown, 17 images]
[im 3/32  brain]
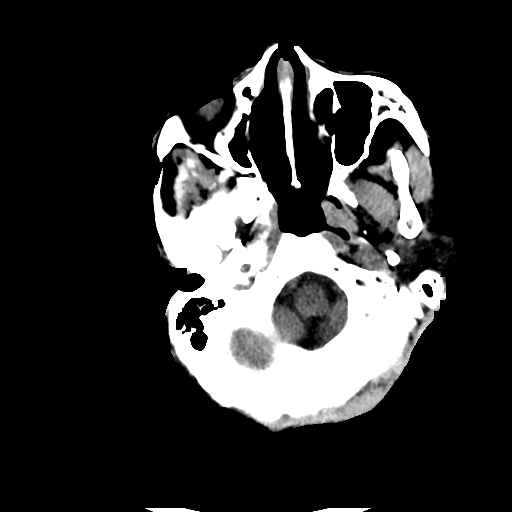
[im 3/32  bone]
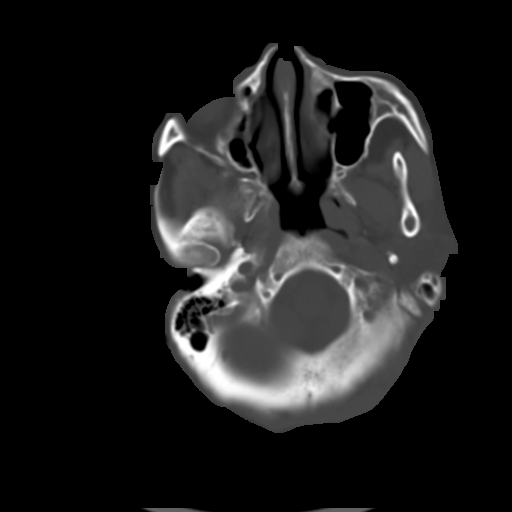
[im 5/32  brain]
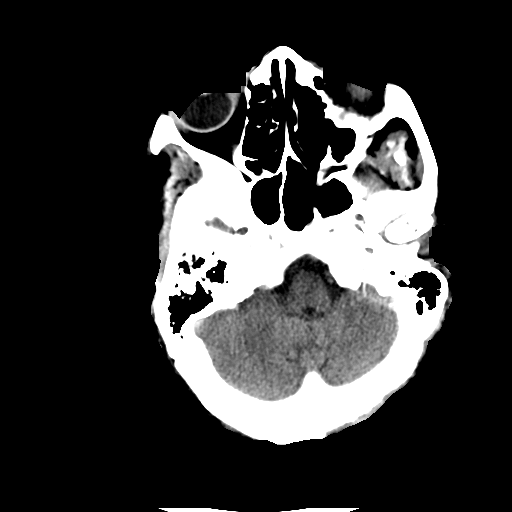
[im 7/32  brain]
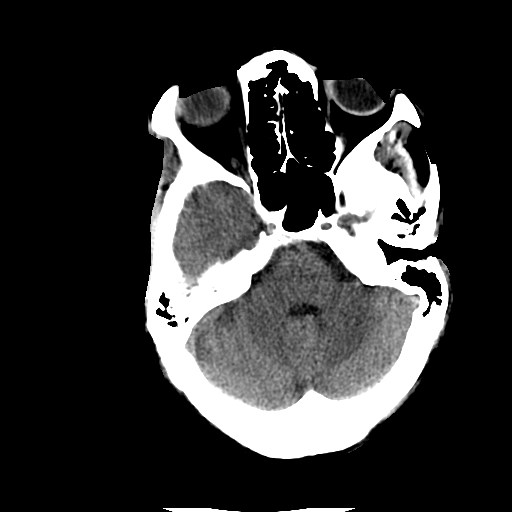
[im 9/32  brain]
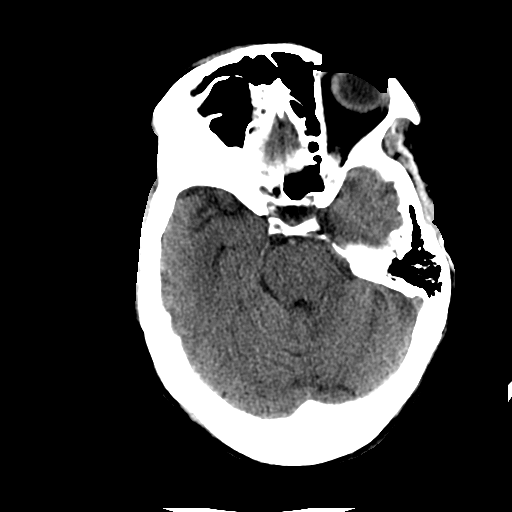
[im 12/32  brain]
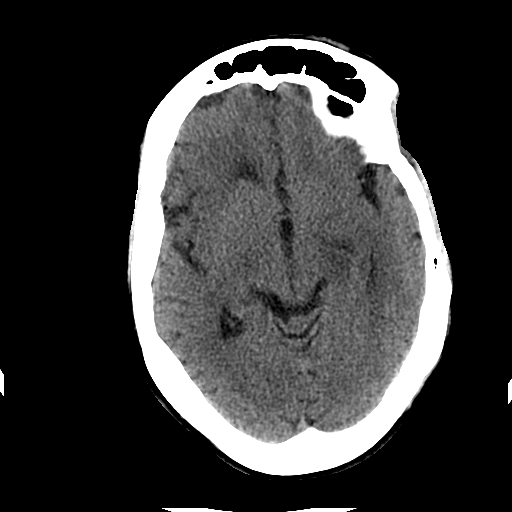
[im 12/32  bone]
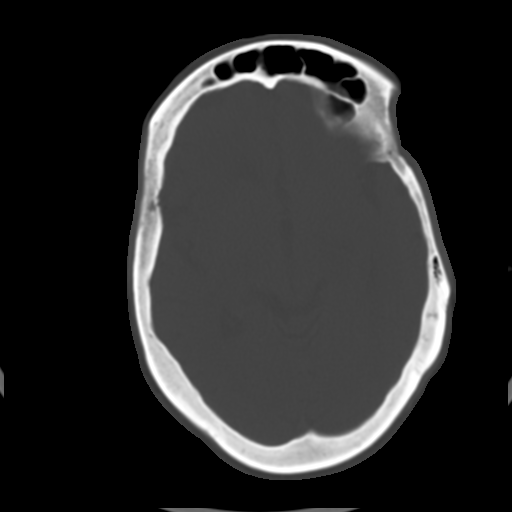
[im 14/32  brain]
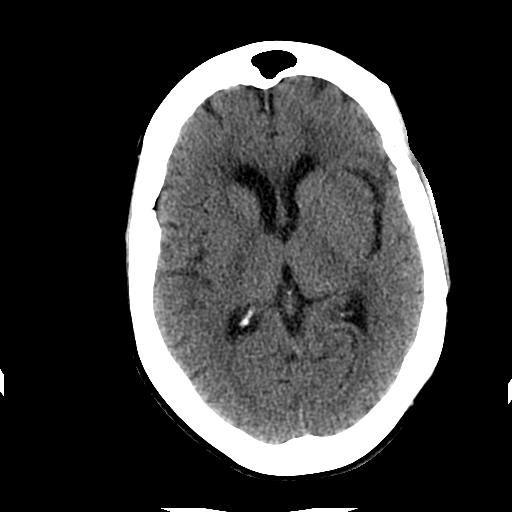
[im 16/32  brain]
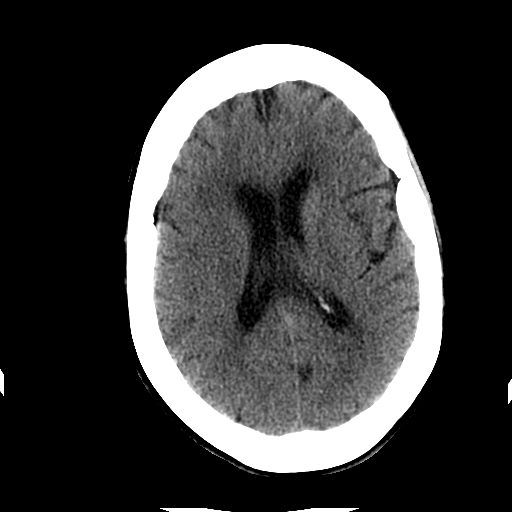
[im 18/32  brain]
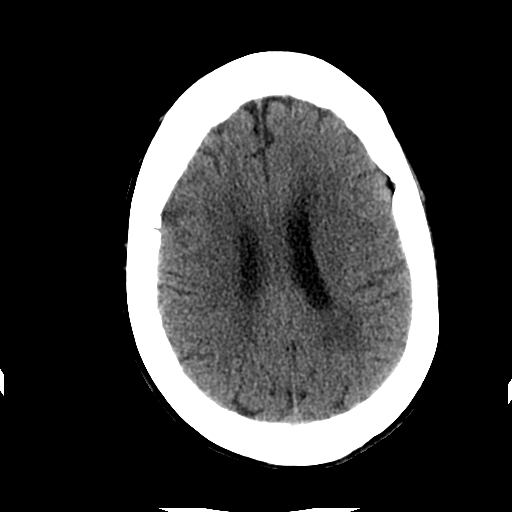
[im 20/32  brain]
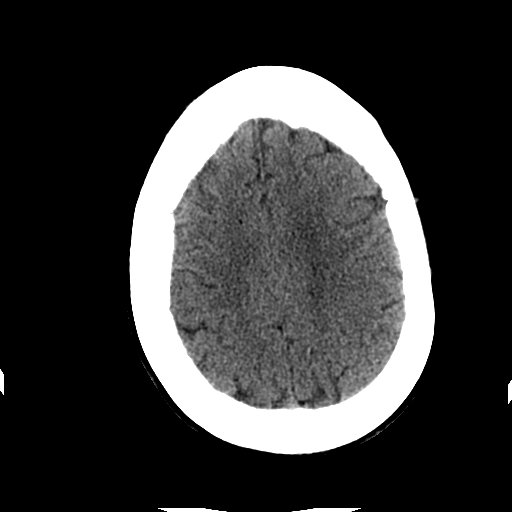
[im 20/32  bone]
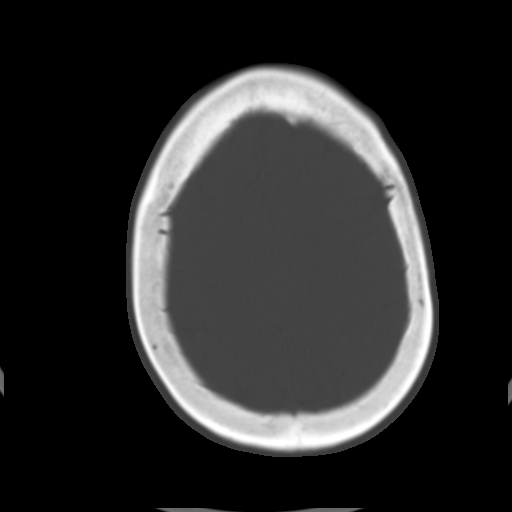
[im 23/32  brain]
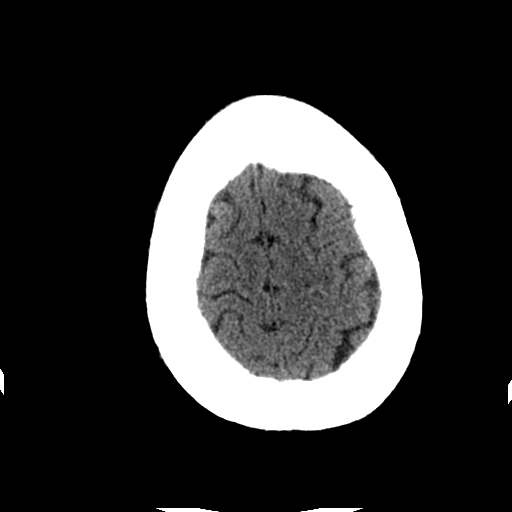
[im 25/32  brain]
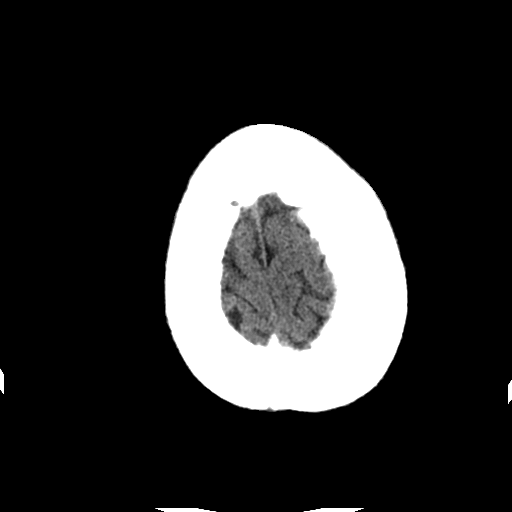
[im 27/32  brain]
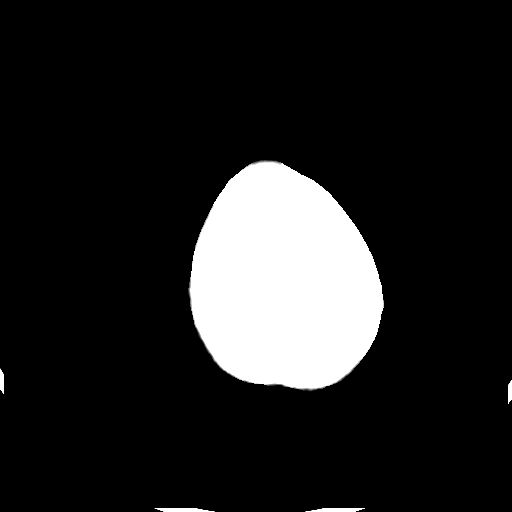
[im 29/32  brain]
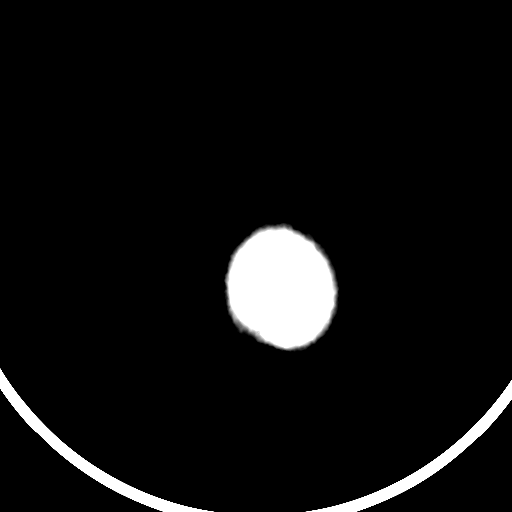
[im 29/32  bone]
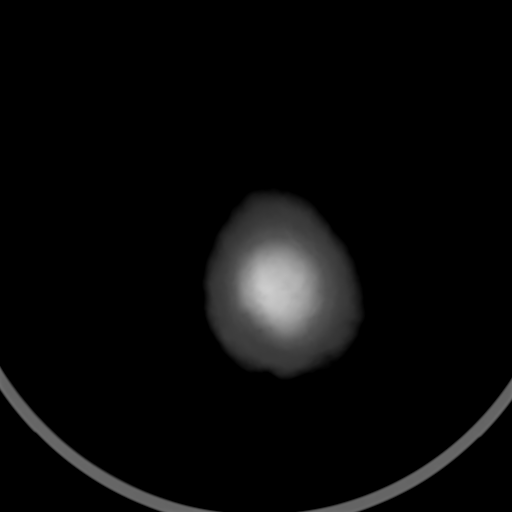

[Series 202: head w/o bone, idose (1) · axial · non-contrast · 0.43mm/px · z∈[+7,+27]mm · 2 of 32 slices shown]
[im 3/32  bone]
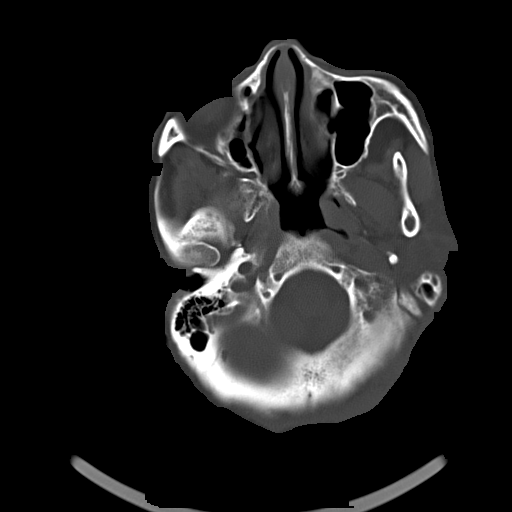
[im 7/32  bone]
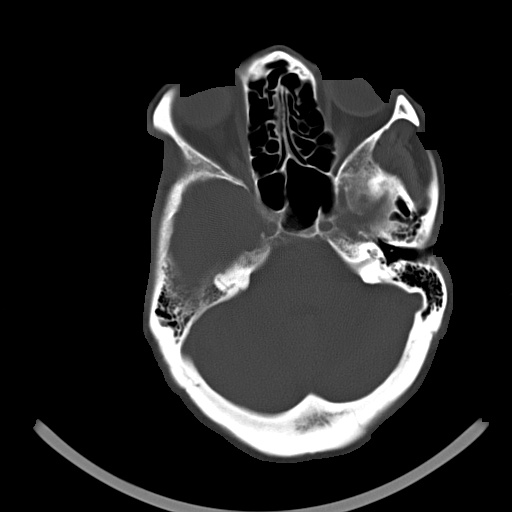

[15 of 30 positions shown; findings below may reference images not displayed]

FINDINGS: There is age related volume loss. There is no intracranial
hemorrhage, extra-axial fluid collection, or midline shift. Patchy
small vessel disease in the centra semiovale bilaterally is stable.
A well-defined mass is not appreciable. There is a focal area of
decreased attenuation in the medial left temporal lobe slightly
superior and lateral to the quadrigeminal plate cistern on the left,
concerning for recent infarct. No other evidence suggesting recent
infarct. The bony calvarium appears intact. The visualized mastoid
air cells are clear. No intraorbital lesions are identified.
IMPRESSION: Suspect recent infarct in the medial left temporal lobe just
superior and lateral to the left quadrigeminal plate cistern. This
finding is best seen on axial slice 12 series 201. Elsewhere, there
is age related volume loss with patchy periventricular small vessel
disease. No hemorrhage.

Comment: An atypical mass with edema in the medial left temporal
lobe could present in this manner. Given this finding, it would be
prudent to correlate with pre and post-contrast brain MRI.

## 2017-01-08 IMAGING — DX DG CHEST 2V
2 series · 2 of 2 positions shown · non-contrast
Comparison: November 30, 2013

CLINICAL DATA: Weakness

EXAM:
CHEST  2 VIEW

[x chest ap]
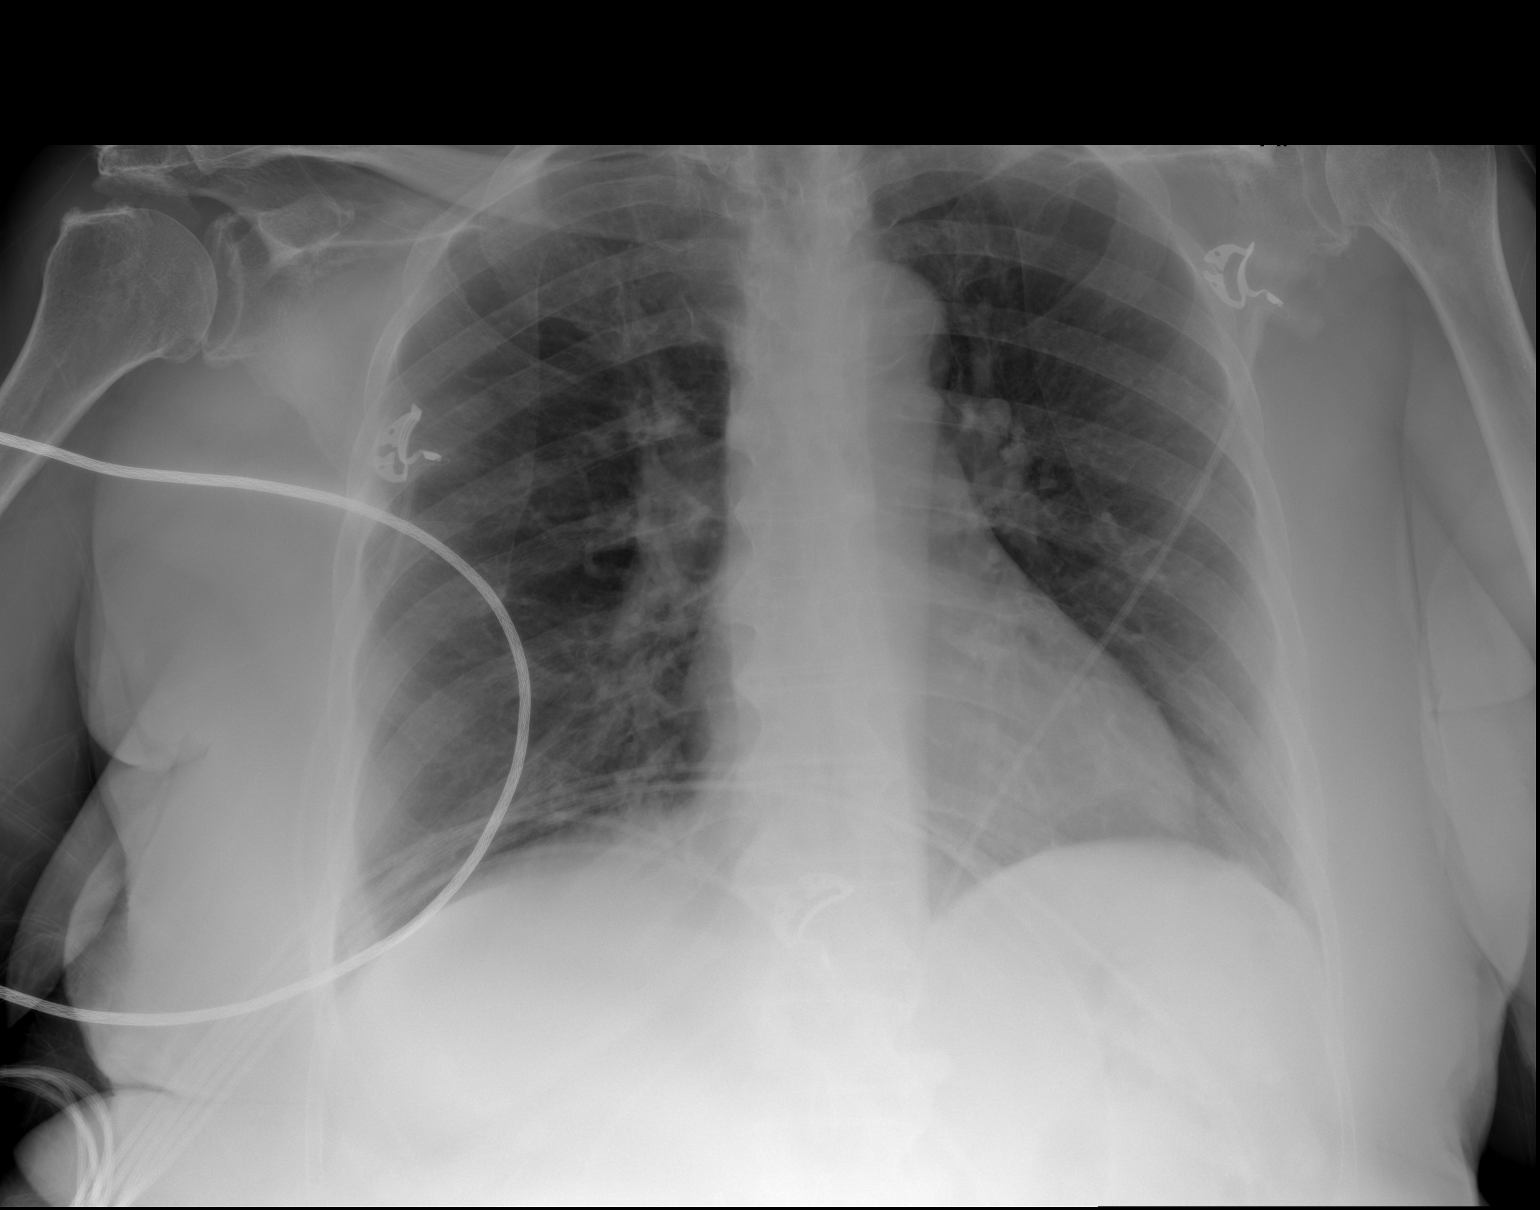

[w chest lat]
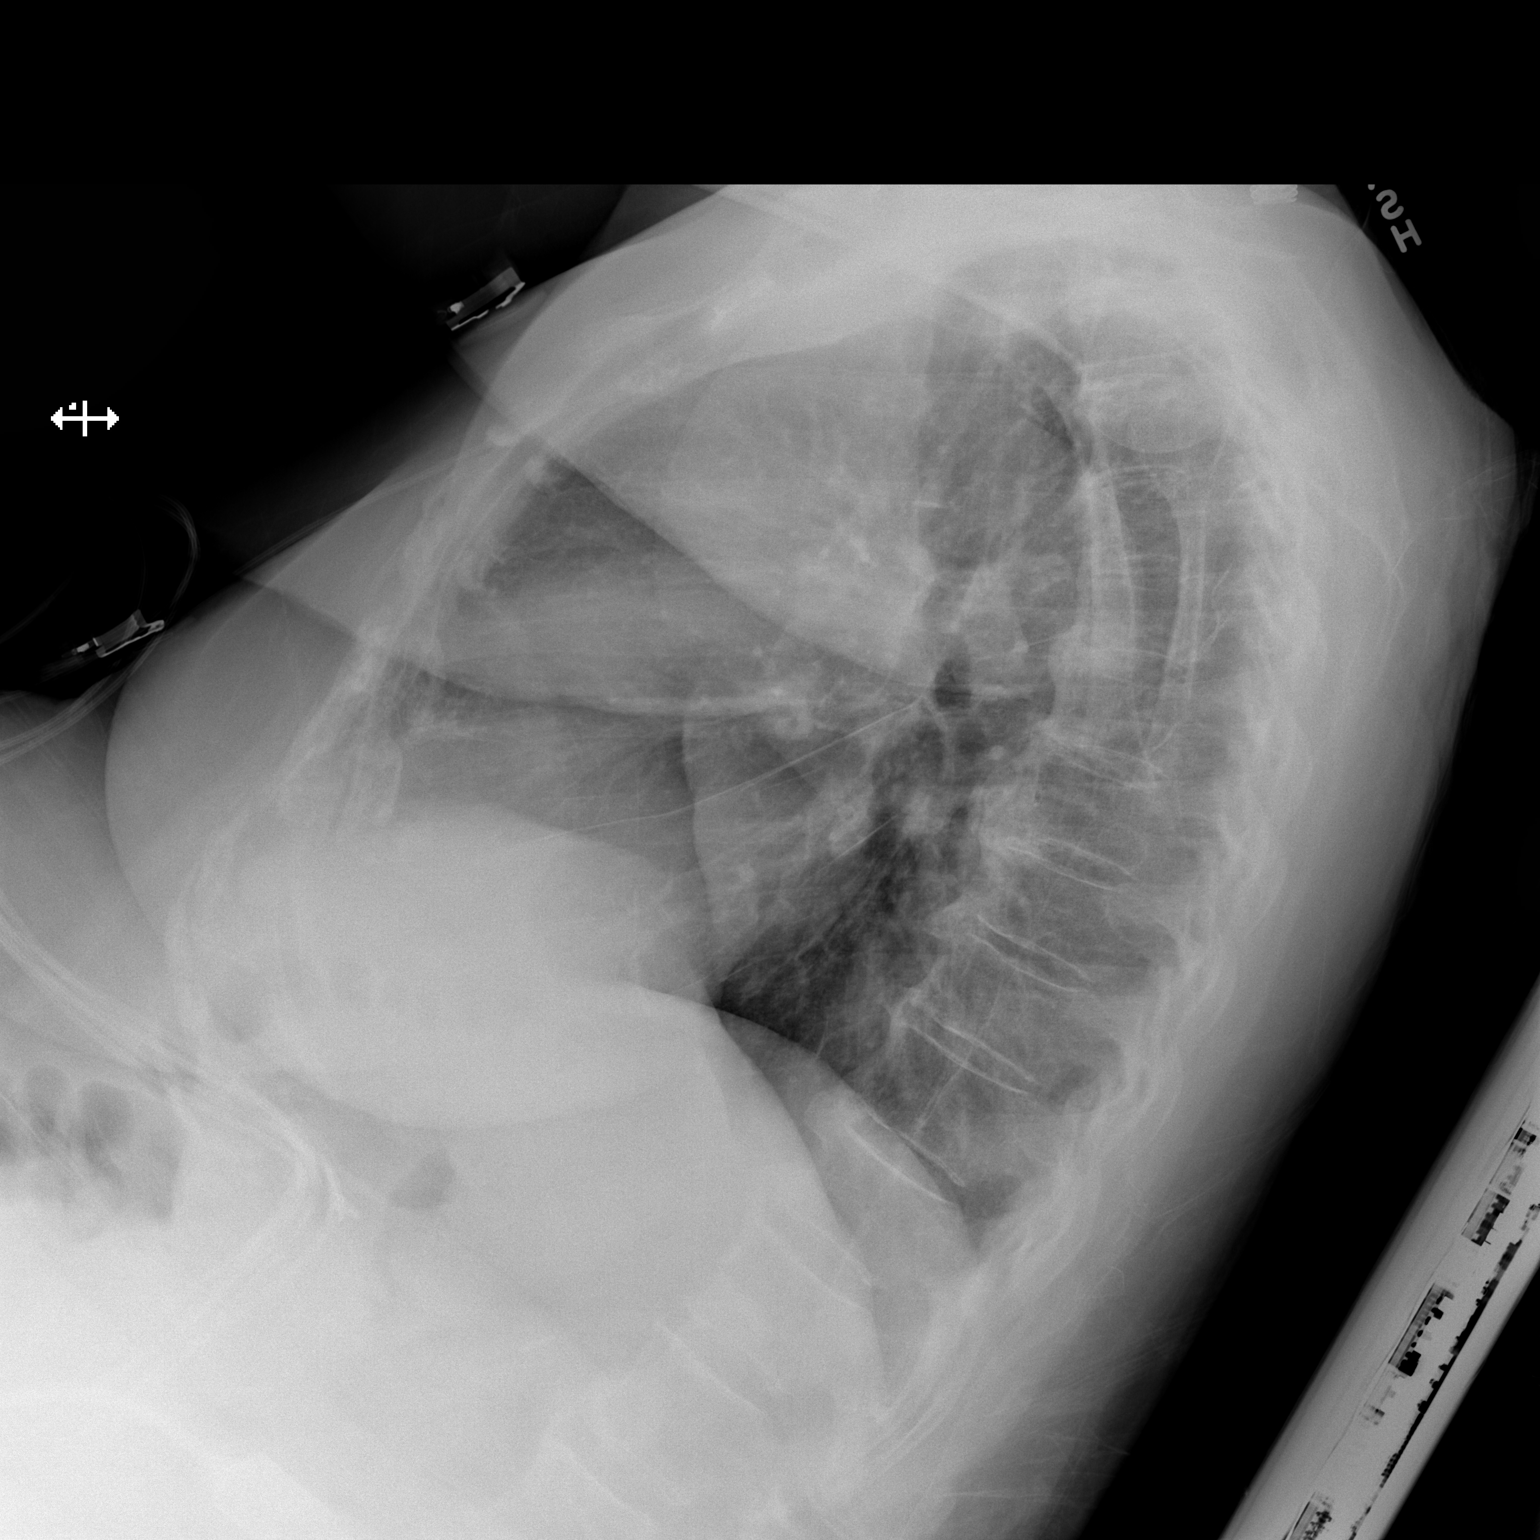

[2 of 2 positions shown; findings below may reference images not displayed]

FINDINGS: There is no edema or consolidation. The heart size and pulmonary
vascularity are normal. No adenopathy. There is degenerative change
in the thoracic spine with diffuse idiopathic skeletal hyperostosis.
IMPRESSION: No edema or consolidation.

## 2017-01-15 ENCOUNTER — Telehealth: Payer: Self-pay | Admitting: Family

## 2017-01-15 MED ORDER — ONETOUCH DELICA LANCETS 33G MISC: 1 refills | Status: DC

## 2017-01-15 NOTE — Telephone Encounter (Signed)
Pt's daughter called in to request the Lancet needles for diabetes machine     Pharmacy: [facility] on [address]: [phone]

## 2017-01-15 NOTE — Telephone Encounter (Signed)
Rx was sent to [facility]. Per verbal from phone operator, [facility]. Pharmacy contact updated.

## 2017-01-15 NOTE — Telephone Encounter (Signed)
Rx sent and pt's daughter has been notified. Also advised her to call us back and confirm if [facility] will be primary pharmacy going forward and do we need to [facility] the [facility]?

## 2017-01-15 NOTE — Telephone Encounter (Signed)
Pt's daughter returned call. She  is requesting Rx be sent to [facility] instead of  [facility].

## 2017-01-21 ENCOUNTER — Emergency Department (HOSPITAL_COMMUNITY)
Admission: EM | Admit: 2017-01-21 | Discharge: 2017-01-21 | Disposition: A | Discharge status: Home / Self Care | Payer: Medicare Other | Attending: Emergency Medicine | Admitting: Emergency Medicine

## 2017-01-21 ENCOUNTER — Encounter (HOSPITAL_COMMUNITY): Payer: Self-pay | Admitting: Family Medicine

## 2017-01-21 DIAGNOSIS — I509 Heart failure, unspecified: Secondary | ICD-10-CM | POA: Insufficient documentation

## 2017-01-21 DIAGNOSIS — Z79899 Other long term (current) drug therapy: Secondary | ICD-10-CM | POA: Insufficient documentation

## 2017-01-21 DIAGNOSIS — M130 Polyarthritis, unspecified: Secondary | ICD-10-CM | POA: Insufficient documentation

## 2017-01-21 DIAGNOSIS — R531 Weakness: Secondary | ICD-10-CM | POA: Diagnosis present

## 2017-01-21 DIAGNOSIS — Z7982 Long term (current) use of aspirin: Secondary | ICD-10-CM | POA: Diagnosis not present

## 2017-01-21 DIAGNOSIS — I10 Essential (primary) hypertension: Secondary | ICD-10-CM | POA: Diagnosis not present

## 2017-01-21 DIAGNOSIS — E119 Type 2 diabetes mellitus without complications: Secondary | ICD-10-CM | POA: Insufficient documentation

## 2017-01-21 DIAGNOSIS — Z7984 Long term (current) use of oral hypoglycemic drugs: Secondary | ICD-10-CM | POA: Insufficient documentation

## 2017-01-21 LAB — COMPREHENSIVE METABOLIC PANEL
ALK PHOS: 81 U/L (ref 38–126)
ALT: 12 U/L — ABNORMAL LOW (ref 14–54)
ANION GAP: 14 (ref 5–15)
AST: 25 U/L (ref 15–41)
Albumin: 4.1 g/dL (ref 3.5–5.0)
BILIRUBIN TOTAL: 0.3 mg/dL (ref 0.3–1.2)
BUN: 12 mg/dL (ref 6–20)
CALCIUM: 9.6 mg/dL (ref 8.9–10.3)
CO2: 24 mmol/L (ref 22–32)
Chloride: 102 mmol/L (ref 101–111)
Creatinine, Ser: 1.1 mg/dL — ABNORMAL HIGH (ref 0.44–1.00)
GFR calc non Af Amer: 44 mL/min — ABNORMAL LOW (ref >60)
GFR, EST AFRICAN AMERICAN: 51 mL/min — AB (ref >60)
GLUCOSE: 81 mg/dL (ref 65–99)
Potassium: 4 mmol/L (ref 3.5–5.1)
Sodium: 140 mmol/L (ref 135–145)
TOTAL PROTEIN: 7.9 g/dL (ref 6.5–8.1)

## 2017-01-21 LAB — CBC WITH DIFFERENTIAL/PLATELET
BASOS ABS: 0 10*3/uL (ref 0.0–0.1)
Basophils Relative: 1 %
Eosinophils Absolute: 0.4 10*3/uL (ref 0.0–0.7)
Eosinophils Relative: 6 %
HEMATOCRIT: 39.3 % (ref 36.0–46.0)
Hemoglobin: 12.7 g/dL (ref 12.0–15.0)
Lymphocytes Relative: 36 %
Lymphs Abs: 2.3 10*3/uL (ref 0.7–4.0)
MCH: 28.3 pg (ref 26.0–34.0)
MCHC: 32.3 g/dL (ref 30.0–36.0)
MCV: 87.5 fL (ref 78.0–100.0)
Monocytes Absolute: 0.4 10*3/uL (ref 0.1–1.0)
Monocytes Relative: 6 %
NEUTROS PCT: 51 %
Neutro Abs: 3.2 10*3/uL (ref 1.7–7.7)
PLATELETS: 231 10*3/uL (ref 150–400)
RBC: 4.49 MIL/uL (ref 3.87–5.11)
RDW: 15.2 % (ref 11.5–15.5)
WBC: 6.3 10*3/uL (ref 4.0–10.5)

## 2017-01-21 LAB — URINALYSIS, ROUTINE W REFLEX MICROSCOPIC
Bilirubin Urine: NEGATIVE
GLUCOSE, UA: NEGATIVE mg/dL
Hgb urine dipstick: NEGATIVE
KETONES UR: NEGATIVE mg/dL
LEUKOCYTES UA: NEGATIVE
NITRITE: NEGATIVE
PROTEIN: NEGATIVE mg/dL
Specific Gravity, Urine: 1.01 (ref 1.005–1.030)
pH: 6 (ref 5.0–8.0)

## 2017-01-21 MED ORDER — ACETAMINOPHEN 500 MG PO TABS
1000.0000 mg | ORAL_TABLET | Freq: Once | ORAL | Status: AC
  Administered 2017-01-21: 1000 mg via ORAL
  Filled 2017-01-21: qty 2

## 2017-01-21 MED ORDER — DICLOFENAC SODIUM 1 % TD GEL: 4.0000 g | Freq: Four times a day (QID) | TRANSDERMAL | 0 refills | Status: DC

## 2017-01-21 NOTE — ED Notes (Signed)
Attempted to obtain blood x 2 with no success. Will ask another staff member for assistance.

## 2017-01-21 NOTE — ED Notes (Signed)
Provider at bedside

## 2017-01-21 NOTE — Discharge Instructions (Signed)
It appears your are having symptoms of arthritis. Please follow-up with your primary care provider for ongoing work-up  Please return without fail for worsening symptoms, including fever, confusion, difficulty walking, or any other symptoms concerning to you.  Continue tylenol and use diclofenac gel over aching joints

## 2017-01-21 NOTE — ED Notes (Signed)
Bed: WA17 Expected date:  Expected time:  Means of arrival:  Comments: EMS- weakness 

## 2017-01-21 NOTE — ED Triage Notes (Signed)
Patient is from home and transported via [facility]. Patient is experiencing generalized weakness for the last two weeks. Patient reports she is experiencing aching in her joints and lower extremity swelling.

## 2017-01-21 NOTE — ED Provider Notes (Signed)
[facility] DEPT Provider Note   CSN: [id number] Arrival date & time: 01/21/17  1030     History   Chief Complaint Chief Complaint  Patient presents with  . Weakness    HPI Angelica Benson is a 83 y.o. female.  HPI 83 year old female who presents joint pains. She reports history of arthritis, asthma, DM, and HTN. States gradual onset of worsening joint pains in bilateral elbows, bilateral knees, bilateral hips, low back, feet and hands for 3 weeks. Took aspirin yesterday to good effect. Denies fall, exertional activity, or injury. Denies joint redness or swelling. Denies fever, chills, rash, insect bite/tick bite, nausea, vomiting, diarrhea, abdominal pain, chest pain, shortness of breath. Felt weak with pain.   Past Medical History:  Diagnosis Date  . Arthritis   . Asthma   . CHF (congestive heart failure) ([place])   . Chronic headaches   . Constipation   . Diabetes mellitus without complication ([place])   . Hemorrhoids without complication   . Hypertension     Patient Active Problem List   Diagnosis Date Noted  . Hypertension   . Diabetes mellitus without complication ([place])   . Chronic headaches   . CHF (congestive heart failure) ([place])   . Asthma   . Carpal tunnel syndrome 09/04/2015  . Cervical disc disorder with radiculopathy of cervical region   . TIA (transient ischemic attack) 03/22/2015  . Obesity, unspecified 08/18/2013  . Goiter 08/18/2013  . Anemia, chronic disease 08/18/2013    Past Surgical History:  Procedure Laterality Date  . ABDOMINAL HYSTERECTOMY    . BACK SURGERY    . CHOLECYSTECTOMY    . KNEE SURGERY      OB History    No data available       Home Medications    Prior to Admission medications   Medication Sig Start Date End Date Taking? Authorizing Provider  albuterol (PROAIR HFA) 108 (90 Base) MCG/ACT inhaler Inhale 2 puffs into the lungs daily as needed for wheezing or shortness of breath.   Yes [provider]  aspirin  325 MG tablet Take 650 mg by mouth at bedtime as needed for mild pain.   Yes [provider]  aspirin EC 81 MG tablet Take 81 mg by mouth daily.   Yes [provider]  atorvastatin (LIPITOR) 10 MG tablet Take 1 tablet (10 mg total) by mouth daily. 11/19/16  Yes Debbrah Alar, NP  DULERA 200-5 MCG/ACT AERO Inhale 2 puffs into the lungs 2 (two) times daily as needed. 08/28/16  Yes Debbrah Alar, NP  fluticasone (FLONASE) 50 MCG/ACT nasal spray Place 1 spray into both nostrils 2 (two) times daily. 02/29/16  Yes [provider]  gabapentin (NEURONTIN) 100 MG capsule Take 1 capsule (100 mg total) by mouth 2 (two) times daily. 08/28/16  Yes Debbrah Alar, NP  hydrochlorothiazide (HYDRODIURIL) 25 MG tablet Take 0.5 tablets (12.5 mg total) by mouth daily. 08/28/16  Yes Debbrah Alar, NP  hydrOXYzine (ATARAX/VISTARIL) 25 MG tablet Take 1 tablet (25 mg total) by mouth at bedtime as needed. Patient taking differently: Take 25 mg by mouth at bedtime as needed (SLEEP).  08/28/16  Yes Debbrah Alar, NP  metFORMIN (GLUCOPHAGE) 500 MG tablet TAKE 1 TABLET BY MOUTH TWICE DAILY 09/24/16  Yes Debbrah Alar, NP  nitroGLYCERIN (NITROSTAT) 0.4 MG SL tablet Place 0.4 mg under the tongue every 5 (five) minutes as needed for chest pain.   Yes [provider]  omeprazole (PRILOSEC) 20 MG capsule  Take 1 capsule (20 mg total) by mouth daily. 08/28/16  Yes Debbrah Alar, NP  potassium chloride SA (K-DUR,KLOR-CON) 20 MEQ tablet Take 1 tablet (20 mEq total) by mouth daily. 08/28/16  Yes Debbrah Alar, NP  sitaGLIPtin (JANUVIA) 100 MG tablet Take 1 tablet (100 mg total) by mouth daily. 08/28/16  Yes Debbrah Alar, NP  traZODone (DESYREL) 50 MG tablet Take 1 tablet (50 mg total) by mouth at bedtime. Patient taking differently: Take 50 mg by mouth at bedtime as needed (SLEEP).  08/28/16  Yes Debbrah Alar, NP  triamcinolone cream (KENALOG) 0.5 %  Apply 1 application topically daily. Use as directed 02/29/16  Yes [provider]  Vitamin D, Ergocalciferol, (DRISDOL) 50000 UNITS CAPS capsule TAKE 1 CAPSULE BY MOUTH TWICE A WEEK Patient taking differently: TAKE 1 CAPSULE BY MOUTH ONCE WEEKLY ON [facility] 02/24/14  Yes Elayne Snare, MD  Blood Glucose Monitoring Suppl (ONE TOUCH ULTRA 2) w/Device KIT Use to check blood sugar daily dx code 250.00 07/03/16   Debbrah Alar, NP  diclofenac sodium (VOLTAREN) 1 % GEL Apply 4 g topically 4 (four) times daily. 01/21/17   Forde Dandy, MD  doxycycline (VIBRAMYCIN) 100 MG capsule Take 1 capsule (100 mg total) by mouth 2 (two) times daily. Patient not taking: Reported on 01/21/2017 08/06/16   Dorie Rank, MD  glucose blood [facility] VERIO) test strip Used to check blood sugar once daily. Dx Code E11.9 12/06/16   Elayne Snare, MD  [facility] DELICA LANCETS 81W MISC USE AS DIRECTED TO CHECK BLOOD SUGAR EVERY DAY Dx code E11.9 01/15/17   Debbrah Alar, NP    Family History Family History  Problem Relation Age of Onset  . Diabetes Son   . Hypertension Son   . Cancer Sister        breast  . Diabetes Daughter        plus 2 grandkids  . Stroke Son        14moage dx, has enlarged heart  . Heart disease Son        passed away    Social History Social History  Substance Use Topics  . Smoking status: Never Smoker  . Smokeless tobacco: Never Used  . Alcohol use No     Allergies   Buprenorphine hcl and Morphine and related   Review of Systems Review of Systems  Constitutional: Negative for fever.  Respiratory: Negative for shortness of breath.   Cardiovascular: Negative for chest pain.  Gastrointestinal: Negative for abdominal pain.  Musculoskeletal: Positive for arthralgias.  All other systems reviewed and are negative.    Physical Exam Updated Vital Signs BP (!) 156/62 (BP Location: Right Arm)   Pulse 68   Temp (!) 97.4 F (36.3 C) (Oral)   Resp 18   Ht _0  (1.549  m)   Wt 93 kg (205 lb)   SpO2 100%   BMI 38.73 kg/m   Physical Exam Physical Exam  Nursing note and vitals reviewed. Constitutional: Well developed, well nourished, non-toxic, and in no acute distress Head: Normocephalic and atraumatic.  Mouth/Throat: Oropharynx is clear and moist.  Neck: Normal range of motion. Neck supple.  Cardiovascular: Normal rate and regular rhythm.   Pulmonary/Chest: Effort normal and breath sounds normal.  Abdominal: Soft. There is no tenderness. There is no rebound and no guarding.  Musculoskeletal: Normal range of motion of all 4 extremities. No significant tenderness of TLS spine. No joint swelling or redness.  Neurological: Alert, no facial droop, fluent speech,  moves all extremities symmetrically, sensation to light touch in tact throughout Skin: Skin is warm and dry.  Psychiatric: Cooperative   ED Treatments / Results  Labs (all labs ordered are listed, but only abnormal results are displayed) Labs Reviewed  COMPREHENSIVE METABOLIC PANEL - Abnormal; Notable for the following:       Result Value   Creatinine, Ser 1.10 (*)    ALT 12 (*)    GFR calc non Af Amer 44 (*)    GFR calc Af Amer 51 (*)    All other components within normal limits  CBC WITH DIFFERENTIAL/PLATELET  URINALYSIS, ROUTINE W REFLEX MICROSCOPIC    EKG  EKG Interpretation None       Radiology No results found.  Procedures Procedures (including critical care time)  Medications Ordered in ED Medications  acetaminophen (TYLENOL) tablet 1,000 mg (1,000 mg Oral Given 01/21/17 1205)     Initial Impression / Assessment and Plan / ED Course  I have reviewed the triage vital signs and the nursing notes.  Pertinent labs & imaging results that were available during my care of the patient were reviewed by me and considered in my medical decision making (see chart for details).     83 year old female who presents with pain in multiple joints. She is well-appearing in no  acute distress. Afebrile and hemodynamically stable. No focal neurological deficits. Her symptoms seem suggestive of arthritis. There is no overlying skin changes, deformities or swelling. Given Tylenol for pain control to good effect. Blood work is reassuring. Nearly is unremarkable. We'll prescribe diclofenac gel and have her continue Tylenol for symptomatic control. She will follow up with PCP for ongoing workup is needed. Strict return and follow-up instructions reviewed. She expressed understanding of all discharge instructions and felt comfortable with the plan of care.   Final Clinical Impressions(s) / ED Diagnoses   Final diagnoses:  Polyarthritis    New Prescriptions New Prescriptions   DICLOFENAC SODIUM (VOLTAREN) 1 % GEL    Apply 4 g topically 4 (four) times daily.     Forde Dandy, MD 01/21/17 [phone]

## 2017-02-18 ENCOUNTER — Ambulatory Visit: Payer: Medicare Other | Admitting: Family

## 2017-02-19 ENCOUNTER — Encounter: Payer: Self-pay | Admitting: Family

## 2017-02-19 ENCOUNTER — Ambulatory Visit (INDEPENDENT_AMBULATORY_CARE_PROVIDER_SITE_OTHER): Payer: Medicare Other | Admitting: Family

## 2017-02-19 VITALS — BP 128/54 | HR 73 | Temp 97.8°F | Resp 18 | Ht 61.0 in | Wt 209.8 lb

## 2017-02-19 DIAGNOSIS — M1990 Unspecified osteoarthritis, unspecified site: Secondary | ICD-10-CM | POA: Diagnosis not present

## 2017-02-19 DIAGNOSIS — I10 Essential (primary) hypertension: Secondary | ICD-10-CM | POA: Diagnosis not present

## 2017-02-19 DIAGNOSIS — I509 Heart failure, unspecified: Secondary | ICD-10-CM | POA: Diagnosis not present

## 2017-02-19 NOTE — Assessment & Plan Note (Signed)
She has chronic sob which is at baseline. She appears euvolemic today. Monitor.

## 2017-02-19 NOTE — Progress Notes (Signed)
Subjective:    Patient ID: Angelica Benson, female    DOB: 09/06/1928, 83 y.o.   MRN: [id number]  HPI  Angelica Benson is an 83 yr old female here for follow up.  1) HTN- maintained on hctz,  BP Readings from Last 3 Encounters:  02/19/17 (!) 128/54  01/21/17 (!) 131/49  11/25/16 124/74   2) Arthritis- reports arthritis pain in legs, back, arms. This is her primary concern today.   3) SOB- reports sob is at baseline.  Edema is at baseline.  Wt Readings from Last 3 Encounters:  02/19/17 209 lb 12.8 oz (95.2 kg)  01/21/17 205 lb (93 kg)  11/25/16 207 lb (93.9 kg)   Denies CP.    Review of Systems See HPI  Past Medical History:  Diagnosis Date  . Arthritis   . Asthma   . CHF (congestive heart failure) ([place])   . Chronic headaches   . Constipation   . Diabetes mellitus without complication ([place])   . Hemorrhoids without complication   . Hypertension      Social History   Socioeconomic History  . Marital status: Single    Spouse name: Not on file  . Number of children: Not on file  . Years of education: Not on file  . Highest education level: Not on file  Social Needs  . Financial resource strain: Not on file  . Food insecurity - worry: Not on file  . Food insecurity - inability: Not on file  . Transportation needs - medical: Not on file  . Transportation needs - non-medical: Not on file  Occupational History  . Not on file  Tobacco Use  . Smoking status: Never Smoker  . Smokeless tobacco: Never Used  Substance and Sexual Activity  . Alcohol use: No  . Drug use: No  . Sexual activity: Not on file  Other Topics Concern  . Not on file  Social History Narrative   Lives at home by herself, aide in the am.     Has 4 daughters and 3 living sons (one son died).    Uses walker.     Divorced at age 30    Past Surgical History:  Procedure Laterality Date  . ABDOMINAL HYSTERECTOMY    . BACK SURGERY    . CHOLECYSTECTOMY    . KNEE SURGERY      Family History    Problem Relation Age of Onset  . Diabetes Son   . Hypertension Son   . Cancer Sister        breast  . Diabetes Daughter        plus 2 grandkids  . Stroke Son        42moage dx, has enlarged heart  . Heart disease Son        passed away    Allergies  Allergen Reactions  . Buprenorphine Hcl Rash  . Morphine And Related Rash    Current Outpatient Medications on File Prior to Visit  Medication Sig Dispense Refill  . albuterol (PROAIR HFA) 108 (90 Base) MCG/ACT inhaler Inhale 2 puffs into the lungs daily as needed for wheezing or shortness of breath.    .Marland Kitchenaspirin 325 MG tablet Take 650 mg by mouth at bedtime as needed for mild pain.    .Marland Kitchenaspirin EC 81 MG tablet Take 81 mg by mouth daily.    .Marland Kitchenatorvastatin (LIPITOR) 10 MG tablet Take 1 tablet (10 mg total) by mouth daily. 90 tablet 1  .  Blood Glucose Monitoring Suppl (ONE TOUCH ULTRA 2) w/Device KIT Use to check blood sugar daily dx code 250.00 1 each 0  . diclofenac sodium (VOLTAREN) 1 % GEL Apply 4 g topically 4 (four) times daily. 100 g 0  . DULERA 200-5 MCG/ACT AERO Inhale 2 puffs into the lungs 2 (two) times daily as needed. 1 Inhaler 1  . fluticasone (FLONASE) 50 MCG/ACT nasal spray Place 1 spray into both nostrils 2 (two) times daily.    Marland Kitchen gabapentin (NEURONTIN) 100 MG capsule Take 1 capsule (100 mg total) by mouth 2 (two) times daily. 60 capsule 1  . glucose blood (ONETOUCH VERIO) test strip Used to check blood sugar once daily. Dx Code E11.9 100 each 4  . hydrochlorothiazide (HYDRODIURIL) 25 MG tablet Take 0.5 tablets (12.5 mg total) by mouth daily. 15 tablet 1  . hydrOXYzine (ATARAX/VISTARIL) 25 MG tablet Take 1 tablet (25 mg total) by mouth at bedtime as needed. (Patient taking differently: Take 25 mg by mouth at bedtime as needed (SLEEP). ) 30 tablet 0  . metFORMIN (GLUCOPHAGE) 500 MG tablet TAKE 1 TABLET BY MOUTH TWICE DAILY 60 tablet 5  . nitroGLYCERIN (NITROSTAT) 0.4 MG SL tablet Place 0.4 mg under the tongue every 5  (five) minutes as needed for chest pain.    Marland Kitchen omeprazole (PRILOSEC) 20 MG capsule Take 1 capsule (20 mg total) by mouth daily. 30 capsule 1  . ONETOUCH DELICA LANCETS 36R MISC USE AS DIRECTED TO CHECK BLOOD SUGAR EVERY DAY Dx code E11.9 100 each 1  . potassium chloride SA (K-DUR,KLOR-CON) 20 MEQ tablet Take 1 tablet (20 mEq total) by mouth daily. 30 tablet 1  . sitaGLIPtin (JANUVIA) 100 MG tablet Take 1 tablet (100 mg total) by mouth daily. 30 tablet 1  . traZODone (DESYREL) 50 MG tablet Take 1 tablet (50 mg total) by mouth at bedtime. (Patient taking differently: Take 50 mg by mouth at bedtime as needed (SLEEP). ) 30 tablet 1  . triamcinolone cream (KENALOG) 0.5 % Apply 1 application topically daily. Use as directed    . Vitamin D, Ergocalciferol, (DRISDOL) 50000 UNITS CAPS capsule TAKE 1 CAPSULE BY MOUTH TWICE A WEEK (Patient taking differently: TAKE 1 CAPSULE BY MOUTH ONCE WEEKLY ON WEDNESDAY) 8 capsule 2   No current facility-administered medications on file prior to visit.     BP (!) 128/54 (BP Location: Right Arm, Cuff Size: Normal)   Pulse 73   Temp 97.8 F (36.6 C) (Oral)   Resp 18   Ht 5' 1" (1.549 m)   Wt 209 lb 12.8 oz (95.2 kg)   SpO2 100%   BMI 39.64 kg/m       Objective:   Physical Exam  Constitutional: She is oriented to person, place, and time. She appears well-developed and well-nourished.  HENT:  Head: Normocephalic and atraumatic.  Cardiovascular: Normal rate, regular rhythm and normal heart sounds.  No murmur heard. Pulmonary/Chest: Effort normal and breath sounds normal. No respiratory distress. She has no wheezes.  Musculoskeletal:  1+ bilateral LE edema  Neurological: She is alert and oriented to person, place, and time.  Psychiatric: She has a normal mood and affect. Her behavior is normal. Judgment and thought content normal.          Assessment & Plan:

## 2017-02-19 NOTE — Assessment & Plan Note (Signed)
Stable, continue hctz. 

## 2017-02-19 NOTE — Assessment & Plan Note (Signed)
We discussed pain most likely osteoarthritis. Discussed less likely possibility of RA. She declines further work up at this time. Advised standing tylenol 1000mg  PO BID.

## 2017-02-19 NOTE — Patient Instructions (Signed)
Please add tylenol 1000mg  twice daily for arthritis pain. Call if you develop worsening shortness of breath.

## 2017-03-14 ENCOUNTER — Other Ambulatory Visit: Payer: Self-pay | Admitting: Family

## 2017-03-18 ENCOUNTER — Other Ambulatory Visit: Payer: Self-pay | Admitting: Family

## 2017-03-19 NOTE — Telephone Encounter (Signed)
Melissa-- we have received refill requests for Januvia and Vitamin D.   1.  I do not see Januvia on current med list. 2.  Last Vitamin D Rx was given by Dr Dwyane Dee in 2015?Marland Kitchen    Please advise?

## 2017-03-21 NOTE — Telephone Encounter (Signed)
rx sent for 30 days suppy.

## 2017-03-21 NOTE — Telephone Encounter (Signed)
Sent 30 day supply of [place]- future refills should come from Dr. Dwyane Dee.  Will need vit D checked prior to vit D refills. We can do this at her upcoming apt.

## 2017-03-25 ENCOUNTER — Ambulatory Visit: Payer: Medicare Other | Admitting: Podiatry

## 2017-03-27 ENCOUNTER — Ambulatory Visit: Payer: Medicare Other | Admitting: Endocrinology

## 2017-04-04 ENCOUNTER — Encounter: Payer: Self-pay | Admitting: Endocrinology

## 2017-04-04 ENCOUNTER — Ambulatory Visit (INDEPENDENT_AMBULATORY_CARE_PROVIDER_SITE_OTHER): Payer: Medicare Other | Admitting: Endocrinology

## 2017-04-04 VITALS — BP 124/68 | HR 68 | Ht 61.0 in | Wt 206.0 lb

## 2017-04-04 DIAGNOSIS — E119 Type 2 diabetes mellitus without complications: Secondary | ICD-10-CM

## 2017-04-04 DIAGNOSIS — E1165 Type 2 diabetes mellitus with hyperglycemia: Secondary | ICD-10-CM | POA: Diagnosis not present

## 2017-04-04 DIAGNOSIS — E049 Nontoxic goiter, unspecified: Secondary | ICD-10-CM

## 2017-04-04 DIAGNOSIS — I10 Essential (primary) hypertension: Secondary | ICD-10-CM

## 2017-04-04 LAB — LIPID PANEL
CHOLESTEROL: 102 mg/dL (ref 0–200)
HDL: 58.8 mg/dL (ref >39.00)
LDL Cholesterol: 30 mg/dL (ref 0–99)
NONHDL: 43.14
Total CHOL/HDL Ratio: 2
Triglycerides: 64 mg/dL (ref 0.0–149.0)
VLDL: 12.8 mg/dL (ref 0.0–40.0)

## 2017-04-04 LAB — MICROALBUMIN / CREATININE URINE RATIO
Creatinine,U: 70.6 mg/dL
MICROALB/CREAT RATIO: 1 mg/g (ref 0.0–30.0)
Microalb, Ur: <0.7 mg/dL (ref 0.0–1.9)

## 2017-04-04 LAB — BASIC METABOLIC PANEL
BUN: 17 mg/dL (ref 6–23)
CALCIUM: 9 mg/dL (ref 8.4–10.5)
CHLORIDE: 101 meq/L (ref 96–112)
CO2: 28 mEq/L (ref 19–32)
Creatinine, Ser: 1.04 mg/dL (ref 0.40–1.20)
GFR: 64.53 mL/min (ref >60.00)
Glucose, Bld: 104 mg/dL — ABNORMAL HIGH (ref 70–99)
Potassium: 4.4 mEq/L (ref 3.5–5.1)
SODIUM: 136 meq/L (ref 135–145)

## 2017-04-04 LAB — POCT GLYCOSYLATED HEMOGLOBIN (HGB A1C): HEMOGLOBIN A1C: 5.8

## 2017-04-04 LAB — TSH: TSH: 1.96 u[IU]/mL (ref 0.35–4.50)

## 2017-04-04 LAB — GLUCOSE, POCT (MANUAL RESULT ENTRY): POC GLUCOSE: 67 mg/dL — AB (ref 70–99)

## 2017-04-04 NOTE — Patient Instructions (Signed)
Have cheese at Bf[facility] and less starchy foods  Take 1/2 HCTZ

## 2017-04-04 NOTE — Progress Notes (Signed)
Patient ID: Angelica Benson, female   DOB: 03-21-1929, 83 y.o.   MRN: [id number]   Reason for Appointment : Followup of diabetes and other issues  History of Present Illness          Diagnosis: Type 2 diabetes mellitus, date of diagnosis: 1992       Past history: She is unclear about the date of her diagnosis and treatment history. At onset she had symptoms of frequent urination, weakness and sleepiness. She has been on various oral hypoglycemic drugs since her diagnosis. Has been on metformin since at least 2006 and Januvia since 2007. Also previously had taken glyburide and Avandia Her A1c has generally been in the 5.9-6.4 range previously  Recent history:   Oral hypoglycemic drugs the patient is taking are: Metformin 500 mg 2x a day, Januvia 158m    She has had fairly consistent control with A1c under 7% This is now lower than before at 5.8, previously 6.5  Current management:  She and has no ability to check her blood sugars and not getting any help to monitor this at home  She has not gained any further weight since her last visit although has been hospitalized recently also  Today her blood sugar in the office was 68but she was asymptomatic  Also does not report any hypoglycemic symptoms at home  Today she had a liver oatmeal and toast and does not add protein to her morning meal usually  Again not able to prepare her meals and not sure how often she is able to watch her diet as instructed  No side effects with metformin     Side effects from medications have been: None  Glucose monitoring:  not done .   Glucometer: One Touch.       Blood Glucose readings not available   Hypoglycemia:   none.   Self-care: The diet that the patient has been following is: tries to limit high-fat foods    Meals: 3 meals per day.  at breakfast has instant oatmeal and toast. At lunch will have sandwich and fruit; dinner is chicken, starch and vegetables; her snacks are fruit,  peanut butter or cheese crackers      Exercise:    will walk around her living area, uses a walker Dietician visit: Most recent: 2000            Compliance with the medical regimen: Fair  Weight history:  Wt Readings from Last 3 Encounters:  04/04/17 206 lb (93.4 kg)  02/19/17 209 lb 12.8 oz (95.2 kg)  01/21/17 205 lb (93 kg)   Glycemic control:   Lab Results  Component Value Date   HGBA1C 5.8 04/04/2017   HGBA1C 6.5 11/14/2016   HGBA1C 6.7 (H) 07/29/2016   Lab Results  Component Value Date   MICROALBUR <0.7 12/27/2015   LDLCALC 98 07/03/2016   CREATININE 1.10 (H) 01/21/2017        Allergies as of 04/04/2017      Reactions   Buprenorphine Hcl Rash   Morphine And Related Rash      Medication List        Accurate as of 04/04/17  3:33 PM. Always use your most recent med list.          aspirin EC 81 MG tablet Take 81 mg by mouth daily.   aspirin 325 MG tablet Take 650 mg by mouth at bedtime as needed for mild pain.   atorvastatin 10 MG tablet Commonly  known as:  LIPITOR Take 1 tablet (10 mg total) by mouth daily.   diclofenac sodium 1 % Gel Commonly known as:  VOLTAREN Apply 4 g topically 4 (four) times daily.   DULERA 200-5 MCG/ACT Aero Generic drug:  mometasone-formoterol Inhale 2 puffs into the lungs 2 (two) times daily as needed.   fluticasone 50 MCG/ACT nasal spray Commonly known as:  FLONASE Place 1 spray into both nostrils 2 (two) times daily.   gabapentin 100 MG capsule Commonly known as:  NEURONTIN Take 1 capsule (100 mg total) by mouth 2 (two) times daily.   glucose blood test strip Commonly known as:  ONETOUCH VERIO Used to check blood sugar once daily. Dx Code E11.9   hydrochlorothiazide 25 MG tablet Commonly known as:  HYDRODIURIL Take 0.5 tablets (12.5 mg total) by mouth daily.   hydrOXYzine 25 MG tablet Commonly known as:  ATARAX/VISTARIL Take 1 tablet (25 mg total) by mouth at bedtime as needed.   JANUVIA 100 MG  tablet Generic drug:  sitaGLIPtin TAKE 1 TABLET BY MOUTH EVERY DAY   metFORMIN 500 MG tablet Commonly known as:  GLUCOPHAGE TAKE 1 TABLET BY MOUTH TWICE DAILY   nitroGLYCERIN 0.4 MG SL tablet Commonly known as:  NITROSTAT Place 0.4 mg under the tongue every 5 (five) minutes as needed for chest pain.   omeprazole 20 MG capsule Commonly known as:  PRILOSEC Take 1 capsule (20 mg total) by mouth daily.   ONE TOUCH ULTRA 2 w/Device Kit Use to check blood sugar daily dx code 147.82   ONETOUCH DELICA LANCETS 95A Misc USE AS DIRECTED TO CHECK BLOOD SUGAR EVERY DAY Dx code E11.9   potassium chloride SA 20 MEQ tablet Commonly known as:  K-DUR,KLOR-CON Take 1 tablet (20 mEq total) by mouth daily.   PROAIR HFA 108 (90 Base) MCG/ACT inhaler Generic drug:  albuterol Inhale 2 puffs into the lungs daily as needed for wheezing or shortness of breath.   traZODone 50 MG tablet Commonly known as:  DESYREL Take 1 tablet (50 mg total) by mouth at bedtime.   triamcinolone cream 0.5 % Commonly known as:  KENALOG Apply 1 application topically daily. Use as directed   Vitamin D (Ergocalciferol) 50000 units Caps capsule Commonly known as:  DRISDOL TAKE 1 CAPSULE BY MOUTH TWICE A WEEK       Allergies:  Allergies  Allergen Reactions  . Buprenorphine Hcl Rash  . Morphine And Related Rash    Past Medical History:  Diagnosis Date  . Arthritis   . Asthma   . CHF (congestive heart failure) ([place])   . Chronic headaches   . Constipation   . Diabetes mellitus without complication ([place])   . Hemorrhoids without complication   . Hypertension     Past Surgical History:  Procedure Laterality Date  . ABDOMINAL HYSTERECTOMY    . BACK SURGERY    . CHOLECYSTECTOMY    . KNEE SURGERY      Family History  Problem Relation Age of Onset  . Diabetes Son   . Hypertension Son   . Cancer Sister        breast  . Diabetes Daughter        plus 2 grandkids  . Stroke Son        75moage dx, has  enlarged heart  . Heart disease Son        passed away    Social History:  reports that  has never smoked. she has never used smokeless  tobacco. She reports that she does not drink alcohol or use drugs.    Review of Systems         Lipids: Has not been on treatment with a statin drug, levels are normal without statin drugs  Lab Results  Component Value Date   CHOL 186 07/03/2016   HDL 63.60 07/03/2016   LDLCALC 98 07/03/2016   TRIG 121.0 07/03/2016   CHOLHDL 3 07/03/2016       The blood pressure has been controlled with HCTZ, followed by PCP    History of anemia, she sees her PCP regularly:  Lab Results  Component Value Date   WBC 6.3 01/21/2017   HGB 12.7 01/21/2017   HCT 39.3 01/21/2017   MCV 87.5 01/21/2017   PLT 231 01/21/2017     NEUROPATHY: On 100 mg gabapentin At bedtime, taking it  regularly otherwise her legs will ache or tingle     Diabetic foot exam in 1/17    She has a history of a small goiter in the past, euthyroid  Lab Results  Component Value Date   TSH 1.62 11/15/2013    Physical Examination:  BP 124/68   Pulse 68   Ht 5' 1"  (1.549 m)   Wt 206 lb (93.4 kg)   SpO2 97%   BMI 38.92 kg/m      ASSESSMENT/ PLAN:   Diabetes type 2 with obesity, relatively mild and well controlled  See history of present illness for  discussion of current diabetes management, blood sugar patterns and problems identified  She appears to have fairly good control now considering her age A1c 6.3  Did not bring her monitor for download and she does not remember her readings She had taken metformin safely before and since her renal function is normal she can take at least 1000 mg a day, discussed that 500 mg once a day is not very effective  Her daughter is going to be involved in her care now including meal planning and medications Discussed checking blood sugars at different times by rotation, no more than one today  Patient Instructions  Have cheese at  Bfst and less starchy foods  Take 1/2 HCTZ    Elayne Snare 04/04/2017, 3:33 PM            Patient ID: Angelica Benson, female   DOB: 04-Aug-1928, 83 y.o.   MRN: [id number]   Reason for Appointment : Followup of diabetes and other issues  History of Present Illness          Diagnosis: Type 2 diabetes mellitus, date of diagnosis: 1992       Past history: She is unclear about the date of her diagnosis and treatment history. At onset she had symptoms of frequent urination, weakness and sleepiness. She has been on various oral hypoglycemic drugs since her diagnosis. Has been on metformin since at least 2006 and Januvia since 2007. Also previously had taken glyburide and Avandia Her A1c has generally been in the 5.9-6.4 range previously  Recent history:   She is on Januvia and her metformin was supposed to be increase back to twice a day but she is still taking it only once a day  She has had fairly consistent control, A1c 6.3  She was given a new glucose monitor on the last visit and she was probably getting falsely high readings previously Did not bring her monitor for download today She is not having any side effects from metformin but only taking it in  the morning Her weight has leveled off  Oral hypoglycemic drugs the patient is taking are: Metformin 500 mg once a day, Januvia 158m      Side effects from medications have been: None  Glucose monitoring:  done once a day or less.   Glucometer: One Touch.       Blood Glucose readings not available today    Hypoglycemia:   none.   Self-care: The diet that the patient has been following is: tries to limit high-fat foods    Meals: 3 meals per day.  at breakfast has oatmeal and toast. At lunch will have sandwich and fruit; dinner is chicken, starch and vegetables; her snacks are fruit, peanut butter or cheese crackers      Exercise:    will walk around her living area Dietician visit: Most recent: 2000            Compliance  with the medical regimen: Good  Weight history:  Wt Readings from Last 3 Encounters:  04/04/17 206 lb (93.4 kg)  02/19/17 209 lb 12.8 oz (95.2 kg)  01/21/17 205 lb (93 kg)   Glycemic control:   Lab Results  Component Value Date   HGBA1C 5.8 04/04/2017   HGBA1C 6.5 11/14/2016   HGBA1C 6.7 (H) 07/29/2016   Lab Results  Component Value Date   MICROALBUR <0.7 12/27/2015   LDLCALC 98 07/03/2016   CREATININE 1.10 (H) 01/21/2017      Office Visit on 04/04/2017  Component Date Value Ref Range Status  . Hemoglobin A1C 04/04/2017 5.8   Final  . POC Glucose 04/04/2017 67* 70 - 99 mg/dl Final    Allergies as of 04/04/2017      Reactions   Buprenorphine Hcl Rash   Morphine And Related Rash      Medication List        Accurate as of 04/04/17  3:33 PM. Always use your most recent med list.          aspirin EC 81 MG tablet Take 81 mg by mouth daily.   aspirin 325 MG tablet Take 650 mg by mouth at bedtime as needed for mild pain.   atorvastatin 10 MG tablet Commonly known as:  LIPITOR Take 1 tablet (10 mg total) by mouth daily.   diclofenac sodium 1 % Gel Commonly known as:  VOLTAREN Apply 4 g topically 4 (four) times daily.   DULERA 200-5 MCG/ACT Aero Generic drug:  mometasone-formoterol Inhale 2 puffs into the lungs 2 (two) times daily as needed.   fluticasone 50 MCG/ACT nasal spray Commonly known as:  FLONASE Place 1 spray into both nostrils 2 (two) times daily.   gabapentin 100 MG capsule Commonly known as:  NEURONTIN Take 1 capsule (100 mg total) by mouth 2 (two) times daily.   glucose blood test strip Commonly known as:  ONETOUCH VERIO Used to check blood sugar once daily. Dx Code E11.9   hydrochlorothiazide 25 MG tablet Commonly known as:  HYDRODIURIL Take 0.5 tablets (12.5 mg total) by mouth daily.   hydrOXYzine 25 MG tablet Commonly known as:  ATARAX/VISTARIL Take 1 tablet (25 mg total) by mouth at bedtime as needed.   JANUVIA 100 MG  tablet Generic drug:  sitaGLIPtin TAKE 1 TABLET BY MOUTH EVERY DAY   metFORMIN 500 MG tablet Commonly known as:  GLUCOPHAGE TAKE 1 TABLET BY MOUTH TWICE DAILY   nitroGLYCERIN 0.4 MG SL tablet Commonly known as:  NITROSTAT Place 0.4 mg under the tongue every 5 (five) minutes  as needed for chest pain.   omeprazole 20 MG capsule Commonly known as:  PRILOSEC Take 1 capsule (20 mg total) by mouth daily.   ONE TOUCH ULTRA 2 w/Device Kit Use to check blood sugar daily dx code 945.03   ONETOUCH DELICA LANCETS 88E Misc USE AS DIRECTED TO CHECK BLOOD SUGAR EVERY DAY Dx code E11.9   potassium chloride SA 20 MEQ tablet Commonly known as:  K-DUR,KLOR-CON Take 1 tablet (20 mEq total) by mouth daily.   PROAIR HFA 108 (90 Base) MCG/ACT inhaler Generic drug:  albuterol Inhale 2 puffs into the lungs daily as needed for wheezing or shortness of breath.   traZODone 50 MG tablet Commonly known as:  DESYREL Take 1 tablet (50 mg total) by mouth at bedtime.   triamcinolone cream 0.5 % Commonly known as:  KENALOG Apply 1 application topically daily. Use as directed   Vitamin D (Ergocalciferol) 50000 units Caps capsule Commonly known as:  DRISDOL TAKE 1 CAPSULE BY MOUTH TWICE A WEEK       Allergies:  Allergies  Allergen Reactions  . Buprenorphine Hcl Rash  . Morphine And Related Rash    Past Medical History:  Diagnosis Date  . Arthritis   . Asthma   . CHF (congestive heart failure) ([place])   . Chronic headaches   . Constipation   . Diabetes mellitus without complication ([place])   . Hemorrhoids without complication   . Hypertension     Past Surgical History:  Procedure Laterality Date  . ABDOMINAL HYSTERECTOMY    . BACK SURGERY    . CHOLECYSTECTOMY    . KNEE SURGERY      Family History  Problem Relation Age of Onset  . Diabetes Son   . Hypertension Son   . Cancer Sister        breast  . Diabetes Daughter        plus 2 grandkids  . Stroke Son        29moage dx, has  enlarged heart  . Heart disease Son        passed away    Social History:  reports that  has never smoked. she has never used smokeless tobacco. She reports that she does not drink alcohol or use drugs.    Review of Systems         Lipids: Has not been on treatment with a statin drug, levels are normal without statin drugs  Lab Results  Component Value Date   CHOL 186 07/03/2016   HDL 63.60 07/03/2016   LDLCALC 98 07/03/2016   TRIG 121.0 07/03/2016   CHOLHDL 3 07/03/2016       The blood pressure has been controlled with HCTZ, followed by PCP  Blood pressure was low normal on initial management She has been recommended 0.5 tablets of the HCTZ but she thinks she is getting the full 25 mg   Lab Results  Component Value Date   CREATININE 1.10 (H) 01/21/2017   BUN 12 01/21/2017   NA 140 01/21/2017   K 4.0 01/21/2017   CL 102 01/21/2017   CO2 24 01/21/2017        NEUROPATHY: On 100 mg gabapentin, this controls tingling and aching in her legs  Diabetic foot exam in 8/18   She has a history of a small goiter in the past, euthyroid  Lab Results  Component Value Date   TSH 1.62 11/15/2013    Physical Examination:  BP 124/68   Pulse 68  Ht 5' 1"  (1.549 m)   Wt 206 lb (93.4 kg)   SpO2 97%   BMI 38.92 kg/m   No ankle edema   ASSESSMENT/ PLAN:   Diabetes type 2 with obesity, relatively mild and well controlled  See history of present illness for  discussion of current diabetes management, blood sugar patterns and problems identified  She is in the regimen of metformin 500 mg twice a day and Januvia  Her blood sugars are well controlled as judged by her A1c of 5.8 although not clear why her A1c has gone down without any weight change over improvement in diet Today discussed the need for balanced meals especially breakfast when she is not getting any protein Also she can try and have her visiting nurse check her blood sugar periodically  Labs: She will have  urine microalbumin collected today also in addition to her lipids and chemistry  Hypertension: Blood pressure is low normal and considering her age she needs to start taking only half HCTZ and follow-up with PCP  History of goiter: will need follow-up TSH on the next visit  Follow-up in 4 months  Patient Instructions  Have cheese at Bfst and less starchy foods  Take 1/2 HCTZ    Elayne Snare 04/04/2017, 3:33 PM

## 2017-04-07 ENCOUNTER — Other Ambulatory Visit: Payer: Self-pay | Admitting: Family

## 2017-04-10 ENCOUNTER — Other Ambulatory Visit: Payer: Self-pay | Admitting: Family

## 2017-04-18 ENCOUNTER — Telehealth: Payer: Self-pay | Admitting: Family

## 2017-04-18 NOTE — Telephone Encounter (Signed)
Last Echo in Epic, 03/2015, EF 60-65%.  Left detailed message on Melinda's voicemail with this information and to call if further concerns.

## 2017-04-18 NOTE — Telephone Encounter (Signed)
Copied from [place] [id number]. Topic: Quick Communication - See Telephone Encounter >> Apr 18, 2017 11:49 AM Oneta Rack wrote: CRM for notification. See Telephone encounter for:   04/18/17.  Caller name: Rip Harbour  Relation to pt: Case Manager [facility] Call back number: [phone] direct ext [phone]   Reason for call:  Inquiring if patient had echocardiogram and if so what is her EF ?

## 2017-04-30 ENCOUNTER — Ambulatory Visit (INDEPENDENT_AMBULATORY_CARE_PROVIDER_SITE_OTHER): Payer: Medicare Other | Admitting: Podiatry

## 2017-04-30 ENCOUNTER — Encounter: Payer: Self-pay | Admitting: Podiatry

## 2017-04-30 DIAGNOSIS — E119 Type 2 diabetes mellitus without complications: Secondary | ICD-10-CM

## 2017-04-30 DIAGNOSIS — B351 Tinea unguium: Secondary | ICD-10-CM | POA: Diagnosis not present

## 2017-04-30 DIAGNOSIS — M79674 Pain in right toe(s): Secondary | ICD-10-CM | POA: Diagnosis not present

## 2017-04-30 DIAGNOSIS — M79675 Pain in left toe(s): Secondary | ICD-10-CM | POA: Diagnosis not present

## 2017-04-30 NOTE — Progress Notes (Signed)
Patient ID: Angelica Benson, female   DOB: 08-Jan-1929, 84 y.o.   MRN: [id number] Complaint:  Visit Type: Patient returns to my office for continued preventative foot care services. Complaint: Patient states" my nails have grown long and thick and become painful to walk and wear shoes" Patient has been diagnosed with DM with neuropathy.. The patient presents for preventative foot care services. No changes to ROS  Podiatric Exam: Vascular: dorsalis pedis and posterior tibial pulses are palpable bilateral. Capillary return is immediate. Temperature gradient is WNL. Skin turgor WNL  Sensorium: Diminished  Semmes Weinstein monofilament test. Normal tactile sensation bilaterally. Nail Exam: Pt has thick disfigured discolored nails with subungual debris noted bilateral entire nail hallux through fifth toenails Ulcer Exam: There is no evidence of ulcer or pre-ulcerative changes or infection. Orthopedic Exam: Muscle tone and strength are WNL. No limitations in general ROM. No crepitus or effusions noted. Foot type and digits show no abnormalities. Bony prominences are unremarkable. Skin: No Porokeratosis. No infection or ulcers  Diagnosis:  Onychomycosis, , Pain in right toe, pain in left toes  Treatment & Plan Procedures and Treatment: Consent by patient was obtained for treatment procedures. The patient understood the discussion of treatment and procedures well. All questions were answered thoroughly reviewed. Debridement of mycotic and hypertrophic toenails, 1 through 5 bilateral and clearing of subungual debris. No ulceration, no infection noted.   Return Visit-Office Procedure: Patient instructed to return to the office for a follow up visit 3 months for continued evaluation and treatment.  Gardiner Barefoot DPM

## 2017-05-02 ENCOUNTER — Other Ambulatory Visit: Payer: Self-pay | Admitting: Family

## 2017-05-02 DIAGNOSIS — M792 Neuralgia and neuritis, unspecified: Secondary | ICD-10-CM

## 2017-05-02 DIAGNOSIS — Z23 Encounter for immunization: Secondary | ICD-10-CM

## 2017-05-02 NOTE — Telephone Encounter (Signed)
Noted  

## 2017-05-08 ENCOUNTER — Other Ambulatory Visit: Payer: Self-pay | Admitting: Family

## 2017-05-13 ENCOUNTER — Other Ambulatory Visit: Payer: Self-pay | Admitting: Family

## 2017-05-20 ENCOUNTER — Ambulatory Visit: Payer: Medicare Other | Admitting: Family

## 2017-05-21 ENCOUNTER — Ambulatory Visit (INDEPENDENT_AMBULATORY_CARE_PROVIDER_SITE_OTHER): Payer: Medicare Other | Admitting: Family

## 2017-05-21 ENCOUNTER — Encounter: Payer: Self-pay | Admitting: Family

## 2017-05-21 VITALS — BP 141/42 | HR 71 | Temp 98.0°F | Resp 16 | Ht 61.0 in | Wt 203.0 lb

## 2017-05-21 DIAGNOSIS — E119 Type 2 diabetes mellitus without complications: Secondary | ICD-10-CM

## 2017-05-21 DIAGNOSIS — I10 Essential (primary) hypertension: Secondary | ICD-10-CM | POA: Diagnosis not present

## 2017-05-21 DIAGNOSIS — M1990 Unspecified osteoarthritis, unspecified site: Secondary | ICD-10-CM

## 2017-05-21 DIAGNOSIS — N289 Disorder of kidney and ureter, unspecified: Secondary | ICD-10-CM

## 2017-05-21 DIAGNOSIS — E785 Hyperlipidemia, unspecified: Secondary | ICD-10-CM | POA: Diagnosis not present

## 2017-05-21 LAB — BASIC METABOLIC PANEL
BUN: 20 mg/dL (ref 6–23)
CHLORIDE: 101 meq/L (ref 96–112)
CO2: 28 meq/L (ref 19–32)
Calcium: 9.3 mg/dL (ref 8.4–10.5)
Creatinine, Ser: 1.12 mg/dL (ref 0.40–1.20)
GFR: 59.22 mL/min — ABNORMAL LOW (ref >60.00)
Glucose, Bld: 90 mg/dL (ref 70–99)
POTASSIUM: 3.5 meq/L (ref 3.5–5.1)
SODIUM: 140 meq/L (ref 135–145)

## 2017-05-21 MED ORDER — NITROGLYCERIN 0.4 MG SL SUBL: 0.4000 mg | SUBLINGUAL_TABLET | SUBLINGUAL | 0 refills | Status: DC | PRN

## 2017-05-21 NOTE — Progress Notes (Signed)
Subjective:    Patient ID: Angelica Benson, female    DOB: 05/31/1928, 84 y.o.   MRN: [id number]  HPI  Angelica Benson is an 84 yr old female who presents today for follow up.  1) HTN- maintained on hctz.   BP Readings from Last 3 Encounters:  05/21/17 (!) 141/42  04/04/17 124/68  02/19/17 (!) 128/54   2) Arthritis-  Last visit we suggested standing tylenol 1064m bid. She never started. Notes that back/hips have been bothering her but overall doing much  3) DM2- well controlled last visit. Maintained on metformin, januvia.  Lab Results  Component Value Date   HGBA1C 5.8 04/04/2017   HGBA1C 6.5 11/14/2016   HGBA1C 6.7 (H) 07/29/2016   Lab Results  Component Value Date   MICROALBUR <0.7 04/04/2017   LDLCALC 30 04/04/2017   CREATININE 1.04 04/04/2017      Review of Systems See HPI  Past Medical History:  Diagnosis Date  . Arthritis   . Asthma   . CHF (congestive heart failure) ([place]   . Chronic headaches   . Constipation   . Diabetes mellitus without complication ([place]   . Hemorrhoids without complication   . Hypertension      Social History   Socioeconomic History  . Marital status: Single    Spouse name: Not on file  . Number of children: Not on file  . Years of education: Not on file  . Highest education level: Not on file  Social Needs  . Financial resource strain: Not on file  . Food insecurity - worry: Not on file  . Food insecurity - inability: Not on file  . Transportation needs - medical: Not on file  . Transportation needs - non-medical: Not on file  Occupational History  . Not on file  Tobacco Use  . Smoking status: Never Smoker  . Smokeless tobacco: Never Used  Substance and Sexual Activity  . Alcohol use: No  . Drug use: No  . Sexual activity: Not on file  Other Topics Concern  . Not on file  Social History Narrative   Lives at home by herself, aide in the am.     Has 4 daughters and 3 living sons (one son died).    Uses walker.     Divorced at age 89   Past Surgical History:  Procedure Laterality Date  . ABDOMINAL HYSTERECTOMY    . BACK SURGERY    . CHOLECYSTECTOMY    . KNEE SURGERY      Family History  Problem Relation Age of Onset  . Diabetes Son   . Hypertension Son   . Cancer Sister        breast  . Diabetes Daughter        plus 2 grandkids  . Stroke Son        161moge dx, has enlarged heart  . Heart disease Son        passed away    Allergies  Allergen Reactions  . Buprenorphine Hcl Rash  . Morphine And Related Rash    Current Outpatient Medications on File Prior to Visit  Medication Sig Dispense Refill  . albuterol (PROAIR HFA) 108 (90 Base) MCG/ACT inhaler Inhale 2 puffs into the lungs daily as needed for wheezing or shortness of breath.    . Marland Kitchenspirin 325 MG tablet Take 650 mg by mouth at bedtime as needed for mild pain.    . Marland Kitchenspirin EC 81 MG tablet Take 81 mg  by mouth daily.    Marland Kitchen atorvastatin (LIPITOR) 10 MG tablet Take 1 tablet (10 mg total) by mouth daily. 90 tablet 0  . Blood Glucose Monitoring Suppl (ONE TOUCH ULTRA 2) w/Device KIT Use to check blood sugar daily dx code 250.00 1 each 0  . diclofenac sodium (VOLTAREN) 1 % GEL Apply 4 g topically 4 (four) times daily. 100 g 0  . DULERA 200-5 MCG/ACT AERO INHALE ONE PUFF INTO LUNGS EVERY 12 HOURS 13 g 1  . fluticasone (FLONASE) 50 MCG/ACT nasal spray Place 1 spray into both nostrils 2 (two) times daily.    Marland Kitchen gabapentin (NEURONTIN) 100 MG capsule TAKE 1 CAPSULE BY MOUTH TWICE DAILY 60 capsule 5  . glucose blood (ONETOUCH VERIO) test strip Used to check blood sugar once daily. Dx Code E11.9 100 each 4  . hydrochlorothiazide (HYDRODIURIL) 25 MG tablet TAKE 1/2 TABLET BY MOUTH DAILY. 15 tablet 3  . hydrOXYzine (ATARAX/VISTARIL) 25 MG tablet TAKE 1 TABLET BY MOUTH EVERY NIGHT AT BEDTIME AS NEEDED FOR SLEEP 30 tablet 5  . JANUVIA 100 MG tablet TAKE 1 TABLET BY MOUTH ONCE DAILY 30 tablet 0  . metFORMIN (GLUCOPHAGE) 500 MG tablet TAKE 1 TABLET BY  MOUTH TWICE DAILY 60 tablet 5  . omeprazole (PRILOSEC) 20 MG capsule TAKE 1 CAPSULE BY MOUTH EVERY MORNING 30 capsule 5  . ONETOUCH DELICA LANCETS 36O MISC USE AS DIRECTED TO CHECK BLOOD SUGAR EVERY DAY Dx code E11.9 100 each 1  . potassium chloride SA (K-DUR,KLOR-CON) 20 MEQ tablet TAKE 1 TABLET BY MOUTH EVERY DAY WITH FOOD 30 tablet 5  . traZODone (DESYREL) 50 MG tablet Take 1 tablet (50 mg total) by mouth at bedtime. (Patient taking differently: Take 50 mg by mouth at bedtime as needed (SLEEP). ) 30 tablet 1  . triamcinolone cream (KENALOG) 0.5 % Apply 1 application topically daily. Use as directed    . Vitamin D, Ergocalciferol, (DRISDOL) 50000 units CAPS capsule TAKE 1 CAPSULE BY MOUTH ONCE A WEEK 4 capsule 7   No current facility-administered medications on file prior to visit.     BP (!) 141/42 (BP Location: Right Arm, Patient Position: Sitting, Cuff Size: Large)   Pulse 71   Temp 98 F (36.7 C) (Oral)   Resp 16   Ht _0  (1.549 m)   Wt 203 lb (92.1 kg)   SpO2 100%   BMI 38.36 kg/m       Objective:   Physical Exam  Constitutional: She is oriented to person, place, and time. She appears well-developed and well-nourished.  Cardiovascular: Normal rate, regular rhythm and normal heart sounds.  No murmur heard. Pulmonary/Chest: Effort normal and breath sounds normal. No respiratory distress. She has no wheezes.  Musculoskeletal:  2+ bilateral LE edema  Neurological: She is alert and oriented to person, place, and time.  Psychiatric: She has a normal mood and affect. Her behavior is normal. Judgment and thought content normal.          Assessment & Plan:  DM2- stable on current meds. Obtain follow up a1c,   Hyperlipidemia- LDL at goal. Continue lipitor.  HTN- BP at goal for age, continue hctz.  Osteoarthritis- advised her to add standing tylenol 1065m bid.   Requests refill on nitroglycerine- has not needed to use, just needs fresh rx to have on hand.

## 2017-05-21 NOTE — Patient Instructions (Signed)
Try adding 1000mg  of tylenol twice daily for arthritis pain.

## 2017-05-29 ENCOUNTER — Emergency Department (HOSPITAL_COMMUNITY)
Admission: EM | Admit: 2017-05-29 | Discharge: 2017-05-29 | Disposition: A | Discharge status: Home / Self Care | Payer: Medicare Other | Attending: Emergency Medicine | Admitting: Emergency Medicine

## 2017-05-29 ENCOUNTER — Encounter (HOSPITAL_COMMUNITY): Payer: Self-pay | Admitting: Emergency Medicine

## 2017-05-29 DIAGNOSIS — R300 Dysuria: Secondary | ICD-10-CM | POA: Diagnosis present

## 2017-05-29 DIAGNOSIS — J45909 Unspecified asthma, uncomplicated: Secondary | ICD-10-CM | POA: Insufficient documentation

## 2017-05-29 DIAGNOSIS — N390 Urinary tract infection, site not specified: Secondary | ICD-10-CM | POA: Diagnosis not present

## 2017-05-29 DIAGNOSIS — E119 Type 2 diabetes mellitus without complications: Secondary | ICD-10-CM | POA: Diagnosis not present

## 2017-05-29 DIAGNOSIS — I110 Hypertensive heart disease with heart failure: Secondary | ICD-10-CM | POA: Diagnosis not present

## 2017-05-29 DIAGNOSIS — Z79899 Other long term (current) drug therapy: Secondary | ICD-10-CM | POA: Insufficient documentation

## 2017-05-29 DIAGNOSIS — Z7982 Long term (current) use of aspirin: Secondary | ICD-10-CM | POA: Insufficient documentation

## 2017-05-29 DIAGNOSIS — Z7984 Long term (current) use of oral hypoglycemic drugs: Secondary | ICD-10-CM | POA: Insufficient documentation

## 2017-05-29 DIAGNOSIS — I509 Heart failure, unspecified: Secondary | ICD-10-CM | POA: Insufficient documentation

## 2017-05-29 LAB — URINALYSIS, ROUTINE W REFLEX MICROSCOPIC
Bilirubin Urine: NEGATIVE
GLUCOSE, UA: NEGATIVE mg/dL
KETONES UR: NEGATIVE mg/dL
Nitrite: NEGATIVE
PH: 5 (ref 5.0–8.0)
Protein, ur: NEGATIVE mg/dL
Specific Gravity, Urine: 1.01 (ref 1.005–1.030)

## 2017-05-29 MED ORDER — CEPHALEXIN 500 MG PO CAPS: 1000.0000 mg | ORAL_CAPSULE | Freq: Two times a day (BID) | ORAL | 0 refills | Status: DC

## 2017-05-29 NOTE — Discharge Instructions (Signed)
Return here as needed.  Follow-up with your doctor for a recheck.  Increase your fluid intake.

## 2017-05-29 NOTE — ED Triage Notes (Signed)
Patient reports that she drinks a lot of water. Reports that she having burning with urination and urinary frequency x 3 days.

## 2017-05-31 NOTE — ED Provider Notes (Signed)
[facility] DEPT Provider Note   CSN: [id number] Arrival date & time: 05/29/17  1137     History   Chief Complaint Chief Complaint  Patient presents with  . Dysuria  . Urinary Frequency    HPI Angelica Benson is a 84 y.o. female.  HPI Patient presents to the emergency department with urinary frequency and dysuria over the last 3 days.  The patient states that she feels like she has had burning with urination as well.  She states this feels like a urinary tract infection which she has had in the past.  She states that nothing seems to make the condition better or worse.  She states she did not take any medications prior to arrival of her symptoms.  The patient denies chest pain, shortness of breath, headache,blurred vision, neck pain, fever, cough, weakness, numbness, dizziness, anorexia, edema, abdominal pain, nausea, vomiting, diarrhea, rash, back pain, hematemesis, bloody stool, near syncope, or syncope. Past Medical History:  Diagnosis Date  . Arthritis   . Asthma   . CHF (congestive heart failure) ([place])   . Chronic headaches   . Constipation   . Diabetes mellitus without complication ([place])   . Hemorrhoids without complication   . Hypertension     Patient Active Problem List   Diagnosis Date Noted  . Arthritis 02/19/2017  . Hypertension   . Diabetes mellitus without complication ([place])   . Chronic headaches   . CHF (congestive heart failure) ([place])   . Asthma   . Carpal tunnel syndrome 09/04/2015  . Cervical disc disorder with radiculopathy of cervical region   . TIA (transient ischemic attack) 03/22/2015  . Obesity, unspecified 08/18/2013  . Goiter 08/18/2013  . Anemia, chronic disease 08/18/2013    Past Surgical History:  Procedure Laterality Date  . ABDOMINAL HYSTERECTOMY    . BACK SURGERY    . CHOLECYSTECTOMY    . KNEE SURGERY      OB History    No data available       Home Medications    Prior to Admission  medications   Medication Sig Start Date End Date Taking? Authorizing Provider  albuterol (PROAIR HFA) 108 (90 Base) MCG/ACT inhaler Inhale 2 puffs into the lungs daily as needed for wheezing or shortness of breath.   Yes [provider]  aspirin 325 MG tablet Take 650 mg by mouth at bedtime as needed for mild pain.   Yes [provider]  aspirin EC 81 MG tablet Take 81 mg by mouth daily.   Yes [provider]  atorvastatin (LIPITOR) 10 MG tablet Take 1 tablet (10 mg total) by mouth daily. 04/11/17  Yes Debbrah Alar, NP  diclofenac sodium (VOLTAREN) 1 % GEL Apply 4 g topically 4 (four) times daily. 01/21/17  Yes Forde Dandy, MD  DULERA 200-5 MCG/ACT AERO INHALE ONE PUFF INTO LUNGS EVERY 12 HOURS 05/13/17  Yes Debbrah Alar, NP  fluticasone (FLONASE) 50 MCG/ACT nasal spray Place 1 spray into both nostrils 2 (two) times daily. 02/29/16  Yes [provider]  gabapentin (NEURONTIN) 100 MG capsule TAKE 1 CAPSULE BY MOUTH TWICE DAILY Patient taking differently: TAKE 1 (100 MG) CAPSULE BY MOUTH TWICE DAILY 05/02/17  Yes Debbrah Alar, NP  hydrochlorothiazide (HYDRODIURIL) 25 MG tablet TAKE 1/2 TABLET BY MOUTH DAILY. Patient taking differently: TAKE 1/2 (12.5 MG) TABLET BY MOUTH DAILY. 05/13/17  Yes Debbrah Alar, NP  hydrOXYzine (ATARAX/VISTARIL) 25 MG tablet TAKE 1 TABLET BY MOUTH EVERY NIGHT AT  BEDTIME AS NEEDED FOR SLEEP Patient taking differently: TAKE 1 TABLET (25 MG) BY MOUTH EVERY NIGHT AT BEDTIME AS NEEDED FOR SLEEP 05/02/17  Yes Debbrah Alar, NP  JANUVIA 100 MG tablet TAKE 1 TABLET BY MOUTH ONCE DAILY Patient taking differently: TAKE 1 TABLET (100 MG) BY MOUTH ONCE DAILY 05/09/17  Yes Debbrah Alar, NP  metFORMIN (GLUCOPHAGE) 500 MG tablet TAKE 1 TABLET BY MOUTH TWICE DAILY Patient taking differently: TAKE 1 TABLET (500 MG) BY MOUTH TWICE DAILY 03/17/17  Yes Debbrah Alar, NP  omeprazole (PRILOSEC) 20 MG capsule TAKE 1  CAPSULE BY MOUTH EVERY MORNING Patient taking differently: TAKE 1 CAPSULE (20 MG) BY MOUTH EVERY MORNING 05/02/17  Yes Debbrah Alar, NP  potassium chloride SA (K-DUR,KLOR-CON) 20 MEQ tablet TAKE 1 TABLET BY MOUTH EVERY DAY WITH FOOD Patient taking differently: TAKE 1 TABLET  (20 MEQ) BY MOUTH EVERY DAY WITH FOOD 05/02/17  Yes Debbrah Alar, NP  traZODone (DESYREL) 50 MG tablet Take 1 tablet (50 mg total) by mouth at bedtime. Patient taking differently: Take 50 mg by mouth at bedtime as needed (SLEEP).  08/28/16  Yes Debbrah Alar, NP  triamcinolone cream (KENALOG) 0.5 % Apply 1 application topically daily. Use as directed 02/29/16  Yes [provider]  Vitamin D, Ergocalciferol, (DRISDOL) 50000 units CAPS capsule TAKE 1 CAPSULE BY MOUTH ONCE A WEEK Patient taking differently: TAKE 1 CAPSULE (50,000 UNITS) BY MOUTH ONCE A WEEK 04/07/17  Yes Debbrah Alar, NP  Blood Glucose Monitoring Suppl (ONE TOUCH ULTRA 2) w/Device KIT Use to check blood sugar daily dx code 250.00 07/03/16   Debbrah Alar, NP  cephALEXin (KEFLEX) 500 MG capsule Take 2 capsules (1,000 mg total) by mouth 2 (two) times daily. 05/29/17   Ephriam Turman, Harrell Gave, PA-C  glucose blood (ONETOUCH VERIO) test strip Used to check blood sugar once daily. Dx Code E11.9 12/06/16   Elayne Snare, MD  nitroGLYCERIN (NITROSTAT) 0.4 MG SL tablet Place 1 tablet (0.4 mg total) under the tongue every 5 (five) minutes as needed for chest pain. 05/21/17   Debbrah Alar, NP  [facility] DELICA LANCETS 40N MISC USE AS DIRECTED TO CHECK BLOOD SUGAR EVERY DAY Dx code E11.9 01/15/17   Debbrah Alar, NP    Family History Family History  Problem Relation Age of Onset  . Diabetes Son   . Hypertension Son   . Cancer Sister        breast  . Diabetes Daughter        plus 2 grandkids  . Stroke Son        70moage dx, has enlarged heart  . Heart disease Son        passed away    Social History Social History    Tobacco Use  . Smoking status: Never Smoker  . Smokeless tobacco: Never Used  Substance Use Topics  . Alcohol use: No  . Drug use: No     Allergies   Buprenorphine hcl and Morphine and related   Review of Systems Review of Systems All other systems negative except as documented in the HPI. All pertinent positives and negatives as reviewed in the HPI.  Physical Exam Updated Vital Signs BP (!) 150/76 (BP Location: Right Arm)   Pulse 67   Temp (!) 97 F (36.1 C) (Axillary)   Resp 17   Ht 5' 1"  (1.549 m)   Wt 86.2 kg (190 lb)   SpO2 100%   BMI 35.90 kg/m   Physical Exam  Constitutional: She is  oriented to person, place, and time. She appears well-developed and well-nourished. No distress.  HENT:  Head: Normocephalic and atraumatic.  Mouth/Throat: Oropharynx is clear and moist.  Eyes: Pupils are equal, round, and reactive to light.  Neck: Normal range of motion. Neck supple.  Cardiovascular: Normal rate, regular rhythm and normal heart sounds. Exam reveals no gallop and no friction rub.  No murmur heard. Pulmonary/Chest: Effort normal and breath sounds normal. No respiratory distress. She has no wheezes.  Abdominal: Soft. Bowel sounds are normal. She exhibits no distension. There is no tenderness.  Neurological: She is alert and oriented to person, place, and time. She exhibits normal muscle tone. Coordination normal.  Skin: Skin is warm and dry. Capillary refill takes less than 2 seconds. No rash noted. No erythema.  Psychiatric: She has a normal mood and affect. Her behavior is normal.  Nursing note and vitals reviewed.    ED Treatments / Results  Labs (all labs ordered are listed, but only abnormal results are displayed) Labs Reviewed  URINALYSIS, ROUTINE W REFLEX MICROSCOPIC - Abnormal; Notable for the following components:      Result Value   APPearance CLOUDY (*)    Hgb urine dipstick MODERATE (*)    Leukocytes, UA LARGE (*)    Bacteria, UA RARE (*)     Squamous Epithelial / LPF 0-5 (*)    All other components within normal limits    EKG  EKG Interpretation None       Radiology No results found.  Procedures Procedures (including critical care time)  Medications Ordered in ED Medications - No data to display   Initial Impression / Assessment and Plan / ED Course  I have reviewed the triage vital signs and the nursing notes.  Pertinent labs & imaging results that were available during my care of the patient were reviewed by me and considered in my medical decision making (see chart for details).     Patient will be treated for urinary tract infection she has no complicating factors and her vital signs been normal here in the emergency department.  I did advise her to follow-up with her primary care doctor.  Patient agrees to the plan and all questions were answered.  Final Clinical Impressions(s) / ED Diagnoses   Final diagnoses:  Lower urinary tract infectious disease    ED Discharge Orders        Ordered    cephALEXin (KEFLEX) 500 MG capsule  2 times daily     05/29/17 1747       Dalia Heading, PA-C 05/31/17 [place], Ankit, MD 05/31/17 1722

## 2017-06-21 ENCOUNTER — Emergency Department (HOSPITAL_COMMUNITY)
Admission: EM | Admit: 2017-06-21 | Discharge: 2017-06-21 | Disposition: A | Discharge status: Home / Self Care | Payer: Medicare Other | Attending: Emergency Medicine | Admitting: Emergency Medicine

## 2017-06-21 ENCOUNTER — Encounter (HOSPITAL_COMMUNITY): Payer: Self-pay | Admitting: Emergency Medicine

## 2017-06-21 DIAGNOSIS — Z7982 Long term (current) use of aspirin: Secondary | ICD-10-CM | POA: Diagnosis not present

## 2017-06-21 DIAGNOSIS — I10 Essential (primary) hypertension: Secondary | ICD-10-CM | POA: Diagnosis not present

## 2017-06-21 DIAGNOSIS — I110 Hypertensive heart disease with heart failure: Secondary | ICD-10-CM | POA: Insufficient documentation

## 2017-06-21 DIAGNOSIS — R42 Dizziness and giddiness: Secondary | ICD-10-CM | POA: Insufficient documentation

## 2017-06-21 DIAGNOSIS — E119 Type 2 diabetes mellitus without complications: Secondary | ICD-10-CM | POA: Diagnosis not present

## 2017-06-21 DIAGNOSIS — I509 Heart failure, unspecified: Secondary | ICD-10-CM | POA: Insufficient documentation

## 2017-06-21 DIAGNOSIS — Z7984 Long term (current) use of oral hypoglycemic drugs: Secondary | ICD-10-CM | POA: Diagnosis not present

## 2017-06-21 DIAGNOSIS — J45909 Unspecified asthma, uncomplicated: Secondary | ICD-10-CM | POA: Insufficient documentation

## 2017-06-21 DIAGNOSIS — Z79899 Other long term (current) drug therapy: Secondary | ICD-10-CM | POA: Diagnosis not present

## 2017-06-21 LAB — CBC WITH DIFFERENTIAL/PLATELET
BASOS ABS: 0 10*3/uL (ref 0.0–0.1)
Basophils Relative: 0 %
Eosinophils Absolute: 0.3 10*3/uL (ref 0.0–0.7)
Eosinophils Relative: 5 %
HEMATOCRIT: 34.8 % — AB (ref 36.0–46.0)
Hemoglobin: 11.1 g/dL — ABNORMAL LOW (ref 12.0–15.0)
LYMPHS ABS: 2.1 10*3/uL (ref 0.7–4.0)
LYMPHS PCT: 39 %
MCH: 28.8 pg (ref 26.0–34.0)
MCHC: 31.9 g/dL (ref 30.0–36.0)
MCV: 90.2 fL (ref 78.0–100.0)
Monocytes Absolute: 0.4 10*3/uL (ref 0.1–1.0)
Monocytes Relative: 8 %
NEUTROS PCT: 48 %
Neutro Abs: 2.5 10*3/uL (ref 1.7–7.7)
PLATELETS: DECREASED 10*3/uL (ref 150–400)
RBC: 3.86 MIL/uL — AB (ref 3.87–5.11)
RDW: 15.1 % (ref 11.5–15.5)
SMEAR REVIEW: DECREASED
WBC: 5.3 10*3/uL (ref 4.0–10.5)

## 2017-06-21 LAB — BASIC METABOLIC PANEL
ANION GAP: 10 (ref 5–15)
BUN: 17 mg/dL (ref 6–20)
CO2: 23 mmol/L (ref 22–32)
Calcium: 9.2 mg/dL (ref 8.9–10.3)
Chloride: 104 mmol/L (ref 101–111)
Creatinine, Ser: 1 mg/dL (ref 0.44–1.00)
GFR, EST AFRICAN AMERICAN: 57 mL/min — AB (ref >60)
GFR, EST NON AFRICAN AMERICAN: 50 mL/min — AB (ref >60)
Glucose, Bld: 101 mg/dL — ABNORMAL HIGH (ref 65–99)
Potassium: 4.1 mmol/L (ref 3.5–5.1)
SODIUM: 137 mmol/L (ref 135–145)

## 2017-06-21 LAB — I-STAT TROPONIN, ED: Troponin i, poc: 0 ng/mL (ref 0.00–0.08)

## 2017-06-21 MED ORDER — MECLIZINE HCL 25 MG PO TABS: 25.0000 mg | ORAL_TABLET | Freq: Three times a day (TID) | ORAL | 0 refills | Status: DC | PRN

## 2017-06-21 NOTE — ED Notes (Signed)
Pt unsteady when standing for orthostatics, stated she felt "very week and dizzy"

## 2017-06-21 NOTE — ED Provider Notes (Signed)
[facility] EMERGENCY DEPARTMENT Provider Note   CSN: [id number] Arrival date & time: 06/21/17  3383     History   Chief Complaint Chief Complaint  Patient presents with  . Dizziness    HPI Angelica Benson is a 84 y.o. female.  HPI Patient reports that she gets dizzy.  This usually happens when she gets up from lying down or sitting.  Feeling is more of a general lightheadedness.  She does not endorse a spinning quality.  No focal weakness numbness or tingling.  Symptoms are most pronounced when she gets up to go to the bathroom in the middle of the night.  She reports oftentimes she is afraid she is going to fall.  She denies she has weakness or numbness in her legs.  She reports she just thinks that she might get weak and off balance and then fall.  There have not been any falls.  Patient denies any associated pain.  She has no headache, no chest pain, no abdominal pain.  She denies any changes in her vision.  She reports her home health care checked on her yesterday and her blood sugar was 79. Past Medical History:  Diagnosis Date  . Arthritis   . Asthma   . CHF (congestive heart failure) ([place])   . Chronic headaches   . Constipation   . Diabetes mellitus without complication ([place])   . Hemorrhoids without complication   . Hypertension     Patient Active Problem List   Diagnosis Date Noted  . Arthritis 02/19/2017  . Hypertension   . Diabetes mellitus without complication ([place])   . Chronic headaches   . CHF (congestive heart failure) ([place])   . Asthma   . Carpal tunnel syndrome 09/04/2015  . Cervical disc disorder with radiculopathy of cervical region   . TIA (transient ischemic attack) 03/22/2015  . Obesity, unspecified 08/18/2013  . Goiter 08/18/2013  . Anemia, chronic disease 08/18/2013    Past Surgical History:  Procedure Laterality Date  . ABDOMINAL HYSTERECTOMY    . BACK SURGERY    . CHOLECYSTECTOMY    . KNEE SURGERY      OB History    No data available       Home Medications    Prior to Admission medications   Medication Sig Start Date End Date Taking? Authorizing Provider  albuterol (PROAIR HFA) 108 (90 Base) MCG/ACT inhaler Inhale 2 puffs into the lungs daily as needed for wheezing or shortness of breath.    [provider]  aspirin 325 MG tablet Take 650 mg by mouth at bedtime as needed for mild pain.    [provider]  aspirin EC 81 MG tablet Take 81 mg by mouth daily.    [provider]  atorvastatin (LIPITOR) 10 MG tablet Take 1 tablet (10 mg total) by mouth daily. 04/11/17   Debbrah Alar, NP  Blood Glucose Monitoring Suppl (ONE TOUCH ULTRA 2) w/Device KIT Use to check blood sugar daily dx code 250.00 07/03/16   Debbrah Alar, NP  cephALEXin (KEFLEX) 500 MG capsule Take 2 capsules (1,000 mg total) by mouth 2 (two) times daily. 05/29/17   Lawyer, Harrell Gave, PA-C  diclofenac sodium (VOLTAREN) 1 % GEL Apply 4 g topically 4 (four) times daily. 01/21/17   Forde Dandy, MD  DULERA 200-5 MCG/ACT AERO INHALE ONE PUFF INTO LUNGS EVERY 12 HOURS 05/13/17   Debbrah Alar, NP  fluticasone (FLONASE) 50 MCG/ACT nasal spray Place 1 spray into both  nostrils 2 (two) times daily. 02/29/16   [provider]  gabapentin (NEURONTIN) 100 MG capsule TAKE 1 CAPSULE BY MOUTH TWICE DAILY Patient taking differently: TAKE 1 (100 MG) CAPSULE BY MOUTH TWICE DAILY 05/02/17   Debbrah Alar, NP  glucose blood (ONETOUCH VERIO) test strip Used to check blood sugar once daily. Dx Code E11.9 12/06/16   Elayne Snare, MD  hydrochlorothiazide (HYDRODIURIL) 25 MG tablet TAKE 1/2 TABLET BY MOUTH DAILY. Patient taking differently: TAKE 1/2 (12.5 MG) TABLET BY MOUTH DAILY. 05/13/17   Debbrah Alar, NP  hydrOXYzine (ATARAX/VISTARIL) 25 MG tablet TAKE 1 TABLET BY MOUTH EVERY NIGHT AT BEDTIME AS NEEDED FOR SLEEP Patient taking differently: TAKE 1 TABLET (25 MG) BY MOUTH EVERY NIGHT AT BEDTIME AS NEEDED  FOR SLEEP 05/02/17   Debbrah Alar, NP  JANUVIA 100 MG tablet TAKE 1 TABLET BY MOUTH ONCE DAILY Patient taking differently: TAKE 1 TABLET (100 MG) BY MOUTH ONCE DAILY 05/09/17   Debbrah Alar, NP  meclizine (ANTIVERT) 25 MG tablet Take 1 tablet (25 mg total) by mouth 3 (three) times daily as needed for dizziness. 06/21/17   Charlesetta Shanks, MD  metFORMIN (GLUCOPHAGE) 500 MG tablet TAKE 1 TABLET BY MOUTH TWICE DAILY Patient taking differently: TAKE 1 TABLET (500 MG) BY MOUTH TWICE DAILY 03/17/17   Debbrah Alar, NP  nitroGLYCERIN (NITROSTAT) 0.4 MG SL tablet Place 1 tablet (0.4 mg total) under the tongue every 5 (five) minutes as needed for chest pain. 05/21/17   Debbrah Alar, NP  omeprazole (PRILOSEC) 20 MG capsule TAKE 1 CAPSULE BY MOUTH EVERY MORNING Patient taking differently: TAKE 1 CAPSULE (20 MG) BY MOUTH EVERY MORNING 05/02/17   Debbrah Alar, NP  [facility] DELICA LANCETS 56E MISC USE AS DIRECTED TO CHECK BLOOD SUGAR EVERY DAY Dx code E11.9 01/15/17   Debbrah Alar, NP  potassium chloride SA (K-DUR,KLOR-CON) 20 MEQ tablet TAKE 1 TABLET BY MOUTH EVERY DAY WITH FOOD Patient taking differently: TAKE 1 TABLET  (20 MEQ) BY MOUTH EVERY DAY WITH FOOD 05/02/17   Debbrah Alar, NP  traZODone (DESYREL) 50 MG tablet Take 1 tablet (50 mg total) by mouth at bedtime. Patient taking differently: Take 50 mg by mouth at bedtime as needed (SLEEP).  08/28/16   Debbrah Alar, NP  triamcinolone cream (KENALOG) 0.5 % Apply 1 application topically daily. Use as directed 02/29/16   [provider]  Vitamin D, Ergocalciferol, (DRISDOL) 50000 units CAPS capsule TAKE 1 CAPSULE BY MOUTH ONCE A WEEK Patient taking differently: TAKE 1 CAPSULE (50,000 UNITS) BY MOUTH ONCE A WEEK 04/07/17   Debbrah Alar, NP    Family History Family History  Problem Relation Age of Onset  . Diabetes Son   . Hypertension Son   . Cancer Sister        breast  . Diabetes Daughter         plus 2 grandkids  . Stroke Son        58moage dx, has enlarged heart  . Heart disease Son        passed away    Social History Social History   Tobacco Use  . Smoking status: Never Smoker  . Smokeless tobacco: Never Used  Substance Use Topics  . Alcohol use: No  . Drug use: No     Allergies   Buprenorphine hcl and Morphine and related   Review of Systems Review of Systems 10 Systems reviewed and are negative for acute change except as noted in the HPI.   Physical  Exam Updated Vital Signs BP (!) 158/57   Pulse 68   Temp 97.8 F (36.6 C) (Oral)   Resp (!) 21   SpO2 100%   Physical Exam  Constitutional: She is oriented to person, place, and time. She appears well-developed and well-nourished. No distress.  Patient is alert and clinically well in appearance.  She is interactive.  No respiratory distress.  HENT:  Head: Normocephalic and atraumatic.  Nose: Nose normal.  Mouth/Throat: Oropharynx is clear and moist.  Eyes: Conjunctivae and EOM are normal. Pupils are equal, round, and reactive to light.  Neck: Neck supple.  Cardiovascular: Normal rate, regular rhythm, normal heart sounds and intact distal pulses.  No murmur heard. Pulmonary/Chest: Effort normal and breath sounds normal. No respiratory distress.  Abdominal: Soft. She exhibits no distension. There is no tenderness. There is no guarding.  Musculoskeletal: Normal range of motion. She exhibits no edema or tenderness.  No peripheral edema.  Distal pedal pulses are 2+ and symmetric.  Skin condition excellent.  Condition of feet is very good.  Neurological: She is alert and oriented to person, place, and time. No cranial nerve deficit or sensory deficit. She exhibits normal muscle tone. Coordination normal.  Cognitive function intact.  Patient is verbally interactive and appropriate.  No cranial nerve deficits.  Grip strength, push pull 5\5 bilateral upper extremities.  Lower extremities 5\5 plantar extension  and flexion against resistance.  Patient can elevate and hold each lower extremity off the bed.  Skin: Skin is warm and dry.  Psychiatric: She has a normal mood and affect.  Nursing note and vitals reviewed.    ED Treatments / Results  Labs (all labs ordered are listed, but only abnormal results are displayed) Labs Reviewed  BASIC METABOLIC PANEL - Abnormal; Notable for the following components:      Result Value   Glucose, Bld 101 (*)    GFR calc non Af Amer 50 (*)    GFR calc Af Amer 57 (*)    All other components within normal limits  CBC WITH DIFFERENTIAL/PLATELET - Abnormal; Notable for the following components:   RBC 3.86 (*)    Hemoglobin 11.1 (*)    HCT 34.8 (*)    All other components within normal limits  URINALYSIS, ROUTINE W REFLEX MICROSCOPIC  I-STAT TROPONIN, ED    EKG  EKG Interpretation  Date/Time:  Saturday June 21 2017 11:23:35 EST Ventricular Rate:  61 PR Interval:    QRS Duration: 151 QT Interval:  410 QTC Calculation: 413 R Axis:   -69 Text Interpretation:  Sinus rhythm Paired ventricular premature complexes Prolonged PR interval 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  Procedures Procedures (including critical care time)  Medications Ordered in ED Medications - No data to display   Initial Impression / Assessment and Plan / ED Course  I have reviewed the triage vital signs and the nursing notes.  Pertinent labs & imaging results that were available during my care of the patient were reviewed by me and considered in my medical decision making (see chart for details).      Final Clinical Impressions(s) / ED Diagnoses   Final diagnoses:  Dizziness   Patient presents with dizziness that occurs predominantly with standing and mostly at night.  She does not have associated symptoms.  Clinically, patient has very well appearance.  She is bright and  alert.  She has not had associated fall or neurologic dysfunction.  EKG unchanged from previous.  Basic chemistry panel within normal limits.  No anemia. No  Elevation in troponin.  Patient did not develop hypotension or tachycardia with orthostatic testing.  At this time highest suspicion is for vertiginous type symptoms.  dizziness usually precipitated by going to the bathroom or standing up.  Plan will be trial of meclizine, observation by her daughter for response to meclizine and close PCP follow up. ED Discharge Orders        Ordered    meclizine (ANTIVERT) 25 MG tablet  3 times daily PRN     06/21/17 1515       Charlesetta Shanks, MD 06/21/17 1640

## 2017-06-21 NOTE — ED Triage Notes (Signed)
Per EMS- pt here from home with c.o. 1 week of intermittent dizziness. Denies pain, denies falls. Alert and oriented X4.

## 2017-06-24 ENCOUNTER — Other Ambulatory Visit: Payer: Self-pay | Admitting: Family

## 2017-07-01 ENCOUNTER — Inpatient Hospital Stay: Payer: Medicare Other | Admitting: Family

## 2017-07-02 ENCOUNTER — Inpatient Hospital Stay: Payer: Medicare Other | Admitting: Family

## 2017-07-08 ENCOUNTER — Ambulatory Visit (INDEPENDENT_AMBULATORY_CARE_PROVIDER_SITE_OTHER): Payer: Medicare Other | Admitting: Family

## 2017-07-08 ENCOUNTER — Telehealth: Payer: Self-pay | Admitting: Family

## 2017-07-08 ENCOUNTER — Encounter: Payer: Self-pay | Admitting: Family

## 2017-07-08 VITALS — BP 146/56 | HR 69 | Temp 97.5°F | Resp 16 | Ht 61.0 in | Wt 197.0 lb

## 2017-07-08 DIAGNOSIS — R42 Dizziness and giddiness: Secondary | ICD-10-CM | POA: Diagnosis not present

## 2017-07-08 DIAGNOSIS — H81399 Other peripheral vertigo, unspecified ear: Secondary | ICD-10-CM

## 2017-07-08 MED ORDER — MECLIZINE HCL 25 MG PO TABS: 25.0000 mg | ORAL_TABLET | Freq: Three times a day (TID) | ORAL | 0 refills | Status: DC | PRN

## 2017-07-08 NOTE — Telephone Encounter (Signed)
Copied from [place] [id number]. Topic: Inquiry >> Jul 08, 2017  8:29 AM Conception Chancy, NT wrote: [facility] is calling and states that every time she goes out there to fill her medicine bottle which is every other week she states the patient is always dizzy. She states that the patient can not check her blood sugars herself and that she believes she may be taking too much medicine. She states when she was seen in the ED that they gave her vertigo medicine but she is out of that now. Please advise. Notified home health that she has a hospital f/u today.  Livia Snellen [phone]

## 2017-07-08 NOTE — Progress Notes (Signed)
Subjective:    Patient ID: Angelica Benson, female    DOB: 05-17-1928, 84 y.o.   MRN: [id number]  HPI  Patient is an 84 year old female who presents today for follow-up.  She presented to the emergency department on June 21, 2017 with chief complaint of dizziness.  ER record is reviewed.  Her EKG during this visit was unchanged from previous.  Lab work was unremarkable.  She did not have orthostasis.  It was felt that her symptoms are most likely vertigo related.  She was given a trial of meclizine and advised to follow-up with primary care.  Today she reports that overall her dizziness is improving some but is not resolved.  She is especially dizzy first thing in the morning when she gets out of the bed.  She reports that she is sleeping quite well.  She has run out of the meclizine but did feel that it helped when she was taking it. Wt Readings from Last 3 Encounters:  07/08/17 197 lb (89.4 kg)  05/29/17 190 lb (86.2 kg)  05/21/17 203 lb (92.1 kg)    Review of Systems    see HPI  Past Medical History:  Diagnosis Date  . Arthritis   . Asthma   . CHF (congestive heart failure) ([place])   . Chronic headaches   . Constipation   . Diabetes mellitus without complication ([place])   . Hemorrhoids without complication   . Hypertension      Social History   Socioeconomic History  . Marital status: Single    Spouse name: Not on file  . Number of children: Not on file  . Years of education: Not on file  . Highest education level: Not on file  Occupational History  . Not on file  Social Needs  . Financial resource strain: Not on file  . Food insecurity:    Worry: Not on file    Inability: Not on file  . Transportation needs:    Medical: Not on file    Non-medical: Not on file  Tobacco Use  . Smoking status: Never Smoker  . Smokeless tobacco: Never Used  Substance and Sexual Activity  . Alcohol use: No  . Drug use: No  . Sexual activity: Not on file  Lifestyle  . Physical  activity:    Days per week: Not on file    Minutes per session: Not on file  . Stress: Not on file  Relationships  . Social connections:    Talks on phone: Not on file    Gets together: Not on file    Attends religious service: Not on file    Active member of club or organization: Not on file    Attends meetings of clubs or organizations: Not on file    Relationship status: Not on file  . Intimate partner violence:    Fear of current or ex partner: Not on file    Emotionally abused: Not on file    Physically abused: Not on file    Forced sexual activity: Not on file  Other Topics Concern  . Not on file  Social History Narrative   Lives at home by herself, aide in the am.     Has 4 daughters and 3 living sons (one son died).    Uses walker.     Divorced at age 4    Past Surgical History:  Procedure Laterality Date  . ABDOMINAL HYSTERECTOMY    . BACK SURGERY    .  CHOLECYSTECTOMY    . KNEE SURGERY      Family History  Problem Relation Age of Onset  . Diabetes Son   . Hypertension Son   . Cancer Sister        breast  . Diabetes Daughter        plus 2 grandkids  . Stroke Son        26moage dx, has enlarged heart  . Heart disease Son        passed away    Allergies  Allergen Reactions  . Buprenorphine Hcl Rash  . Morphine And Related Rash    Current Outpatient Medications on File Prior to Visit  Medication Sig Dispense Refill  . albuterol (PROAIR HFA) 108 (90 Base) MCG/ACT inhaler Inhale 2 puffs into the lungs daily as needed for wheezing or shortness of breath.    .Marland Kitchenaspirin 325 MG tablet Take 650 mg by mouth at bedtime as needed for mild pain.    .Marland Kitchenaspirin EC 81 MG tablet Take 81 mg by mouth daily.    .Marland Kitchenatorvastatin (LIPITOR) 10 MG tablet Take 1 tablet (10 mg total) by mouth daily. 90 tablet 0  . Blood Glucose Monitoring Suppl (ONE TOUCH ULTRA 2) w/Device KIT Use to check blood sugar daily dx code 250.00 1 each 0  . cephALEXin (KEFLEX) 500 MG capsule Take 2  capsules (1,000 mg total) by mouth 2 (two) times daily. 28 capsule 0  . diclofenac sodium (VOLTAREN) 1 % GEL Apply 4 g topically 4 (four) times daily. 100 g 0  . DULERA 200-5 MCG/ACT AERO INHALE ONE PUFF INTO LUNGS EVERY 12 HOURS 13 g 1  . fluticasone (FLONASE) 50 MCG/ACT nasal spray INSTILL 2 SPRAYS IN EACH NOSTRIL DAILY 16 g 1  . gabapentin (NEURONTIN) 100 MG capsule TAKE 1 CAPSULE BY MOUTH TWICE DAILY (Patient taking differently: TAKE 1 (100 MG) CAPSULE BY MOUTH TWICE DAILY) 60 capsule 5  . glucose blood (ONETOUCH VERIO) test strip Used to check blood sugar once daily. Dx Code E11.9 100 each 4  . hydrochlorothiazide (HYDRODIURIL) 25 MG tablet TAKE 1/2 TABLET BY MOUTH DAILY. (Patient taking differently: TAKE 1/2 (12.5 MG) TABLET BY MOUTH DAILY.) 15 tablet 3  . hydrOXYzine (ATARAX/VISTARIL) 25 MG tablet TAKE 1 TABLET BY MOUTH EVERY NIGHT AT BEDTIME AS NEEDED FOR SLEEP (Patient taking differently: TAKE 1 TABLET (25 MG) BY MOUTH EVERY NIGHT AT BEDTIME AS NEEDED FOR SLEEP) 30 tablet 5  . JANUVIA 100 MG tablet TAKE 1 TABLET BY MOUTH ONCE DAILY (Patient taking differently: TAKE 1 TABLET (100 MG) BY MOUTH ONCE DAILY) 30 tablet 0  . meclizine (ANTIVERT) 25 MG tablet Take 1 tablet (25 mg total) by mouth 3 (three) times daily as needed for dizziness. 30 tablet 0  . metFORMIN (GLUCOPHAGE) 500 MG tablet TAKE 1 TABLET BY MOUTH TWICE DAILY (Patient taking differently: TAKE 1 TABLET (500 MG) BY MOUTH TWICE DAILY) 60 tablet 5  . nitroGLYCERIN (NITROSTAT) 0.4 MG SL tablet Place 1 tablet (0.4 mg total) under the tongue every 5 (five) minutes as needed for chest pain. 20 tablet 0  . omeprazole (PRILOSEC) 20 MG capsule TAKE 1 CAPSULE BY MOUTH EVERY MORNING (Patient taking differently: TAKE 1 CAPSULE (20 MG) BY MOUTH EVERY MORNING) 30 capsule 5  . ONETOUCH DELICA LANCETS 362EMISC USE AS DIRECTED TO CHECK BLOOD SUGAR EVERY DAY Dx code E11.9 100 each 1  . potassium chloride SA (K-DUR,KLOR-CON) 20 MEQ tablet TAKE 1 TABLET  BY MOUTH EVERY  DAY WITH FOOD (Patient taking differently: TAKE 1 TABLET  (20 MEQ) BY MOUTH EVERY DAY WITH FOOD) 30 tablet 5  . traZODone (DESYREL) 50 MG tablet Take 1 tablet (50 mg total) by mouth at bedtime. (Patient taking differently: Take 50 mg by mouth at bedtime as needed (SLEEP). ) 30 tablet 1  . triamcinolone cream (KENALOG) 0.5 % APPLY TOPICALLY TWICE DAILY AS NEEDED FOR RASHES 120 g 1  . Vitamin D, Ergocalciferol, (DRISDOL) 50000 units CAPS capsule TAKE 1 CAPSULE BY MOUTH ONCE A WEEK (Patient taking differently: TAKE 1 CAPSULE (50,000 UNITS) BY MOUTH ONCE A WEEK) 4 capsule 7   No current facility-administered medications on file prior to visit.     BP (!) 146/56 (BP Location: Right Arm, Patient Position: Sitting, Cuff Size: Small)   Pulse 69   Temp (!) 97.5 F (36.4 C) (Oral)   Resp 16   Ht _0  (1.549 m)   Wt 197 lb (89.4 kg)   SpO2 100%   BMI 37.22 kg/m    Objective:   Physical Exam  Constitutional: She is oriented to person, place, and time. She appears well-developed and well-nourished.  HENT:  Head: Normocephalic and atraumatic.  Eyes: Pupils are equal, round, and reactive to light. EOM are normal.  Cardiovascular: Normal rate, regular rhythm and normal heart sounds.  No murmur heard. Pulmonary/Chest: Effort normal and breath sounds normal. No respiratory distress. She has no wheezes.  Musculoskeletal: She exhibits no edema.  Neurological: She is alert and oriented to person, place, and time. She exhibits normal muscle tone.  Skin: Skin is warm and dry.  Psychiatric: She has a normal mood and affect. Her behavior is normal. Judgment and thought content normal.          Assessment & Plan:  Dizziness-will discontinue Atarax and trazodone.  I feel that these may be contributing to her daytime sleepiness as well as her dizziness.  A refill has been provided for as needed meclizine.  In addition I will obtain a CT of the head to rule out previous CVA.  She is neuro  intact today.

## 2017-07-08 NOTE — Patient Instructions (Addendum)
Stop trazodone and atarax (hydroxyzine). You will be contacted about scheduling your CT scan.  You may use meclizine as needed for diziness. Stand slowly- especially in the AM.

## 2017-07-09 NOTE — Telephone Encounter (Signed)
Reviewed with pt at her visit 07/08/17.

## 2017-07-15 ENCOUNTER — Other Ambulatory Visit: Payer: Self-pay | Admitting: Family

## 2017-07-16 ENCOUNTER — Ambulatory Visit (HOSPITAL_BASED_OUTPATIENT_CLINIC_OR_DEPARTMENT_OTHER)
Admission: RE | Admit: 2017-07-16 | Discharge: 2017-07-16 | Disposition: A | Discharge status: Home / Self Care | Payer: Medicare Other | Source: Ambulatory Visit | Attending: Family | Admitting: Family

## 2017-07-16 DIAGNOSIS — H81399 Other peripheral vertigo, unspecified ear: Secondary | ICD-10-CM

## 2017-07-16 DIAGNOSIS — R42 Dizziness and giddiness: Secondary | ICD-10-CM | POA: Insufficient documentation

## 2017-07-23 ENCOUNTER — Ambulatory Visit (INDEPENDENT_AMBULATORY_CARE_PROVIDER_SITE_OTHER): Payer: Medicare Other | Admitting: Family

## 2017-07-23 ENCOUNTER — Encounter: Payer: Self-pay | Admitting: Family

## 2017-07-23 VITALS — BP 143/55 | HR 79 | Temp 97.6°F | Resp 16 | Ht 61.0 in | Wt 193.4 lb

## 2017-07-23 DIAGNOSIS — E119 Type 2 diabetes mellitus without complications: Secondary | ICD-10-CM

## 2017-07-23 DIAGNOSIS — R42 Dizziness and giddiness: Secondary | ICD-10-CM | POA: Diagnosis not present

## 2017-07-23 MED ORDER — GLUCOSE BLOOD VI STRP: ORAL_STRIP | 12 refills | Status: DC

## 2017-07-23 NOTE — Progress Notes (Signed)
Subjective:    Patient ID: Angelica Benson, female    DOB: [birth year], 84 y.o.   MRN: [id number]  HPI   Angelica Benson is an 84 yr old female who presents today for follow up. Last visit we discontinued atarax and trazodone due to c/o dizziness.  Since discontinuing these medications she notes resolution of her dizziness.  She and her daughter have questions on proper usage of her glucometer.  Review of Systems See HPI    Past Medical History:  Diagnosis Date  . Arthritis   . Asthma   . CHF (congestive heart failure) ([place])   . Chronic headaches   . Constipation   . Diabetes mellitus without complication ([place])   . Hemorrhoids without complication   . Hypertension      Social History   Socioeconomic History  . Marital status: Single    Spouse name: Not on file  . Number of children: Not on file  . Years of education: Not on file  . Highest education level: Not on file  Occupational History  . Not on file  Social Needs  . Financial resource strain: Not on file  . Food insecurity:    Worry: Not on file    Inability: Not on file  . Transportation needs:    Medical: Not on file    Non-medical: Not on file  Tobacco Use  . Smoking status: Never Smoker  . Smokeless tobacco: Never Used  Substance and Sexual Activity  . Alcohol use: No  . Drug use: No  . Sexual activity: Not on file  Lifestyle  . Physical activity:    Days per week: Not on file    Minutes per session: Not on file  . Stress: Not on file  Relationships  . Social connections:    Talks on phone: Not on file    Gets together: Not on file    Attends religious service: Not on file    Active member of club or organization: Not on file    Attends meetings of clubs or organizations: Not on file    Relationship status: Not on file  . Intimate partner violence:    Fear of current or ex partner: Not on file    Emotionally abused: Not on file    Physically abused: Not on file    Forced sexual activity: Not  on file  Other Topics Concern  . Not on file  Social History Narrative   Lives at home by herself, aide in the am.     Has 4 daughters and 3 living sons (one son died).    Uses walker.     Divorced at age 85    Past Surgical History:  Procedure Laterality Date  . ABDOMINAL HYSTERECTOMY    . BACK SURGERY    . CHOLECYSTECTOMY    . KNEE SURGERY      Family History  Problem Relation Age of Onset  . Diabetes Son   . Hypertension Son   . Cancer Sister        breast  . Diabetes Daughter        plus 2 grandkids  . Stroke Son        2moage dx, has enlarged heart  . Heart disease Son        passed away    Allergies  Allergen Reactions  . Buprenorphine Hcl Rash  . Morphine And Related Rash    Current Outpatient Medications on File Prior to Visit  Medication Sig Dispense Refill  . albuterol (PROAIR HFA) 108 (90 Base) MCG/ACT inhaler Inhale 2 puffs into the lungs daily as needed for wheezing or shortness of breath.    Marland Kitchen aspirin 325 MG tablet Take 650 mg by mouth at bedtime as needed for mild pain.    Marland Kitchen aspirin EC 81 MG tablet Take 81 mg by mouth daily.    Marland Kitchen atorvastatin (LIPITOR) 10 MG tablet TAKE 1 TABLET BY MOUTH ONCE DAILY 90 tablet 0  . Blood Glucose Monitoring Suppl (ONE TOUCH ULTRA 2) w/Device KIT Use to check blood sugar daily dx code 250.00 1 each 0  . cephALEXin (KEFLEX) 500 MG capsule Take 2 capsules (1,000 mg total) by mouth 2 (two) times daily. 28 capsule 0  . diclofenac sodium (VOLTAREN) 1 % GEL Apply 4 g topically 4 (four) times daily. 100 g 0  . DULERA 200-5 MCG/ACT AERO INHALE ONE PUFF INTO LUNGS EVERY 12 HOURS 1 Inhaler 0  . fluticasone (FLONASE) 50 MCG/ACT nasal spray INSTILL 2 SPRAYS IN EACH NOSTRIL DAILY 16 g 1  . gabapentin (NEURONTIN) 100 MG capsule TAKE 1 CAPSULE BY MOUTH TWICE DAILY (Patient taking differently: TAKE 1 (100 MG) CAPSULE BY MOUTH TWICE DAILY) 60 capsule 5  . glucose blood (ONETOUCH VERIO) test strip Used to check blood sugar once daily. Dx  Code E11.9 100 each 4  . hydrochlorothiazide (HYDRODIURIL) 25 MG tablet TAKE 1/2 TABLET BY MOUTH DAILY. (Patient taking differently: TAKE 1/2 (12.5 MG) TABLET BY MOUTH DAILY.) 15 tablet 3  . meclizine (ANTIVERT) 25 MG tablet Take 1 tablet (25 mg total) by mouth 3 (three) times daily as needed for dizziness. 30 tablet 0  . metFORMIN (GLUCOPHAGE) 500 MG tablet TAKE 1 TABLET BY MOUTH TWICE DAILY (Patient taking differently: TAKE 1 TABLET (500 MG) BY MOUTH TWICE DAILY) 60 tablet 5  . nitroGLYCERIN (NITROSTAT) 0.4 MG SL tablet Place 1 tablet (0.4 mg total) under the tongue every 5 (five) minutes as needed for chest pain. 20 tablet 0  . omeprazole (PRILOSEC) 20 MG capsule TAKE 1 CAPSULE BY MOUTH EVERY MORNING (Patient taking differently: TAKE 1 CAPSULE (20 MG) BY MOUTH EVERY MORNING) 30 capsule 5  . ONETOUCH DELICA LANCETS 20B MISC USE AS DIRECTED TO CHECK BLOOD SUGAR EVERY DAY Dx code E11.9 100 each 1  . potassium chloride SA (K-DUR,KLOR-CON) 20 MEQ tablet TAKE 1 TABLET BY MOUTH EVERY DAY WITH FOOD (Patient taking differently: TAKE 1 TABLET  (20 MEQ) BY MOUTH EVERY DAY WITH FOOD) 30 tablet 5  . sitaGLIPtin (JANUVIA) 100 MG tablet Take 1 tablet (100 mg total) by mouth 1 day or 1 dose for 1 dose. 30 tablet 0  . triamcinolone cream (KENALOG) 0.5 % APPLY TOPICALLY TWICE DAILY AS NEEDED FOR RASHES 120 g 1  . Vitamin D, Ergocalciferol, (DRISDOL) 50000 units CAPS capsule TAKE 1 CAPSULE BY MOUTH ONCE A WEEK (Patient taking differently: TAKE 1 CAPSULE (50,000 UNITS) BY MOUTH ONCE A WEEK) 4 capsule 7   No current facility-administered medications on file prior to visit.     BP (!) 143/55 (BP Location: Right Arm, Patient Position: Sitting, Cuff Size: Small)   Pulse 79   Temp 97.6 F (36.4 C) (Oral)   Resp 16   Ht 5' 1"  (1.549 m)   Wt 193 lb 6.4 oz (87.7 kg)   SpO2 100%   BMI 36.54 kg/m    Objective:   Physical Exam  Constitutional: She is oriented to person, place, and time. She appears well-developed  and well-nourished.  Cardiovascular: Normal rate, regular rhythm and normal heart sounds.  No murmur heard. Pulmonary/Chest: Effort normal and breath sounds normal. No respiratory distress. She has no wheezes.  Neurological: She is alert and oriented to person, place, and time.  Psychiatric: She has a normal mood and affect. Her behavior is normal. Judgment and thought content normal.          Assessment & Plan:  Dizziness-I suspect that this was related to side effects or medications.  Symptoms have resolved.  Diabetes type 2- the patient was given a new glucometer and prescription for test strips.  She was instructed on proper use by the CMA today.

## 2017-07-30 ENCOUNTER — Ambulatory Visit (INDEPENDENT_AMBULATORY_CARE_PROVIDER_SITE_OTHER): Payer: Medicare Other | Admitting: Podiatry

## 2017-07-30 ENCOUNTER — Encounter: Payer: Self-pay | Admitting: Podiatry

## 2017-07-30 DIAGNOSIS — M79675 Pain in left toe(s): Secondary | ICD-10-CM

## 2017-07-30 DIAGNOSIS — E119 Type 2 diabetes mellitus without complications: Secondary | ICD-10-CM | POA: Diagnosis not present

## 2017-07-30 DIAGNOSIS — M79674 Pain in right toe(s): Secondary | ICD-10-CM

## 2017-07-30 DIAGNOSIS — B351 Tinea unguium: Secondary | ICD-10-CM

## 2017-07-30 NOTE — Progress Notes (Signed)
Patient ID: Angelica Benson, female   DOB: 12-Jan-1929, 84 y.o.   MRN: [id number] Complaint:  Visit Type: Patient returns to my office for continued preventative foot care services. Complaint: Patient states" my nails have grown long and thick and become painful to walk and wear shoes" Patient has been diagnosed with DM with neuropathy.. The patient presents for preventative foot care services. No changes to ROS  Podiatric Exam: Vascular: dorsalis pedis and posterior tibial pulses are palpable bilateral. Capillary return is immediate. Temperature gradient is WNL. Skin turgor WNL  Sensorium: Diminished  Semmes Weinstein monofilament test. Normal tactile sensation bilaterally. Nail Exam: Pt has thick disfigured discolored nails with subungual debris noted bilateral entire nail hallux through fifth toenails Ulcer Exam: There is no evidence of ulcer or pre-ulcerative changes or infection. Orthopedic Exam: Muscle tone and strength are WNL. No limitations in general ROM. No crepitus or effusions noted. Foot type and digits show no abnormalities. Bony prominences are unremarkable. Skin: No Porokeratosis. No infection or ulcers  Diagnosis:  Onychomycosis, , Pain in right toe, pain in left toes  Treatment & Plan Procedures and Treatment: Consent by patient was obtained for treatment procedures. The patient understood the discussion of treatment and procedures well. All questions were answered thoroughly reviewed. Debridement of mycotic and hypertrophic toenails, 1 through 5 bilateral and clearing of subungual debris. No ulceration, no infection noted.   Return Visit-Office Procedure: Patient instructed to return to the office for a follow up visit 3 months for continued evaluation and treatment.  Gardiner Barefoot DPM

## 2017-08-05 ENCOUNTER — Ambulatory Visit: Payer: Medicare Other | Admitting: Endocrinology

## 2017-08-06 ENCOUNTER — Encounter: Payer: Self-pay | Admitting: Endocrinology

## 2017-08-06 ENCOUNTER — Ambulatory Visit (INDEPENDENT_AMBULATORY_CARE_PROVIDER_SITE_OTHER): Payer: Medicare Other | Admitting: Endocrinology

## 2017-08-06 VITALS — BP 118/64 | HR 84 | Ht 61.0 in | Wt 192.4 lb

## 2017-08-06 DIAGNOSIS — E119 Type 2 diabetes mellitus without complications: Secondary | ICD-10-CM

## 2017-08-06 DIAGNOSIS — E049 Nontoxic goiter, unspecified: Secondary | ICD-10-CM | POA: Diagnosis not present

## 2017-08-06 LAB — GLUCOSE, POCT (MANUAL RESULT ENTRY): POC Glucose: 97 mg/dl (ref 70–99)

## 2017-08-06 LAB — POCT GLYCOSYLATED HEMOGLOBIN (HGB A1C): HEMOGLOBIN A1C: 5.5

## 2017-08-06 NOTE — Progress Notes (Signed)
Patient ID: Angelica Benson, female   DOB: 05/23/1928, 84 y.o.   MRN: [id number]   Reason for Appointment : Followup of diabetes and other issues  History of Present Illness          Diagnosis: Type 2 diabetes mellitus, date of diagnosis: 1992       Past history: She is unclear about the date of her diagnosis and treatment history. At onset she had symptoms of frequent urination, weakness and sleepiness. She has been on various oral hypoglycemic drugs since her diagnosis. Has been on metformin since at least 2006 and Januvia since 2007. Also previously had taken glyburide and Avandia Her A1c has generally been in the 5.9-6.4 range previously  Recent history:   Oral hypoglycemic drugs the patient is taking are: Metformin 500 mg 2x a day, Januvia 129m    She has had fairly consistent control with A1c under 7% This is now lower than before at 5.5  Current management:  Her family has not been able to understand how to check her blood sugar at home and has not done any monitoring  Lab glucose was 94 today in the office nonfasting  Because of various other problems and recent decreased appetite she appears to have lost weight compared to last year  Diet was reviewed and she tends to have relatively high carbohydrate meal again without any protein, has difficulty preparing any protein item in the morning.  She is still not able to do much physical activity especially recently  Also lab glucose in 3/19 was in the normal range  No side effects with metformin and is taking this twice a day without a problem     Side effects from medications have been: None  Glucose monitoring:  not done .   Glucometer:  Accu-Chek guide     Blood Glucose readings not available   Hypoglycemia:   none.   Self-care: The diet that the patient has been following is: tries to limit high-fat foods     Meals: 3 meals per day.  at breakfast has instant oatmeal and toast.  At lunch will have  sandwich and fruit; dinner is chicken, starch and vegetables; her snacks are fruit, peanut butter or cheese crackers      Exercise:    will walk some around her living area, uses a walker  Dietician visit: Most recent: 2000            Compliance with the medical regimen: Fairly good  Weight history:  Wt Readings from Last 3 Encounters:  08/06/17 192 lb 6.4 oz (87.3 kg)  07/23/17 193 lb 6.4 oz (87.7 kg)  07/08/17 197 lb (89.4 kg)   Glycemic control:   Lab Results  Component Value Date   HGBA1C 5.5 08/06/2017   HGBA1C 5.8 04/04/2017   HGBA1C 6.5 11/14/2016   Lab Results  Component Value Date   MICROALBUR <0.7 04/04/2017   LDLCALC 30 04/04/2017   CREATININE 1.00 06/21/2017        Allergies as of 08/06/2017      Reactions   Buprenorphine Hcl Rash   Morphine And Related Rash      Medication List        Accurate as of 08/06/17  9:09 PM. Always use your most recent med list.          aspirin EC 81 MG tablet Take 81 mg by mouth daily.   atorvastatin 10 MG tablet Commonly known as:  LIPITOR TAKE 1 TABLET  BY MOUTH ONCE DAILY   diclofenac sodium 1 % Gel Commonly known as:  VOLTAREN Apply 4 g topically 4 (four) times daily.   DULERA 200-5 MCG/ACT Aero Generic drug:  mometasone-formoterol INHALE ONE PUFF INTO LUNGS EVERY 12 HOURS   fluticasone 50 MCG/ACT nasal spray Commonly known as:  FLONASE INSTILL 2 SPRAYS IN EACH NOSTRIL DAILY   gabapentin 100 MG capsule Commonly known as:  NEURONTIN TAKE 1 CAPSULE BY MOUTH TWICE DAILY   glucose blood test strip Commonly known as:  ACCU-CHEK AVIVA Use as instructed   hydrochlorothiazide 25 MG tablet Commonly known as:  HYDRODIURIL TAKE 1/2 TABLET BY MOUTH DAILY.   meclizine 25 MG tablet Commonly known as:  ANTIVERT Take 1 tablet (25 mg total) by mouth 3 (three) times daily as needed for dizziness.   metFORMIN 500 MG tablet Commonly known as:  GLUCOPHAGE TAKE 1 TABLET BY MOUTH TWICE DAILY   nitroGLYCERIN 0.4  MG SL tablet Commonly known as:  NITROSTAT Place 1 tablet (0.4 mg total) under the tongue every 5 (five) minutes as needed for chest pain.   omeprazole 20 MG capsule Commonly known as:  PRILOSEC TAKE 1 CAPSULE BY MOUTH EVERY MORNING   ONE TOUCH ULTRA 2 w/Device Kit Use to check blood sugar daily dx code 540.08   ONETOUCH DELICA LANCETS 67Y Misc USE AS DIRECTED TO CHECK BLOOD SUGAR EVERY DAY Dx code E11.9   potassium chloride SA 20 MEQ tablet Commonly known as:  K-DUR,KLOR-CON TAKE 1 TABLET BY MOUTH EVERY DAY WITH FOOD   PROAIR HFA 108 (90 Base) MCG/ACT inhaler Generic drug:  albuterol Inhale 2 puffs into the lungs daily as needed for wheezing or shortness of breath.   sitaGLIPtin 100 MG tablet Commonly known as:  JANUVIA Take 1 tablet (100 mg total) by mouth 1 day or 1 dose for 1 dose.   triamcinolone cream 0.5 % Commonly known as:  KENALOG APPLY TOPICALLY TWICE DAILY AS NEEDED FOR RASHES   Vitamin D (Ergocalciferol) 50000 units Caps capsule Commonly known as:  DRISDOL TAKE 1 CAPSULE BY MOUTH ONCE A WEEK       Allergies:  Allergies  Allergen Reactions  . Buprenorphine Hcl Rash  . Morphine And Related Rash    Past Medical History:  Diagnosis Date  . Arthritis   . Asthma   . CHF (congestive heart failure) ([place])   . Chronic headaches   . Constipation   . Diabetes mellitus without complication ([place])   . Hemorrhoids without complication   . Hypertension     Past Surgical History:  Procedure Laterality Date  . ABDOMINAL HYSTERECTOMY    . BACK SURGERY    . CHOLECYSTECTOMY    . KNEE SURGERY      Family History  Problem Relation Age of Onset  . Diabetes Son   . Hypertension Son   . Cancer Sister        breast  . Diabetes Daughter        plus 2 grandkids  . Stroke Son        76moage dx, has enlarged heart  . Heart disease Son        passed away    Social History:  reports that she has never smoked. She has never used smokeless tobacco. She reports  that she does not drink alcohol or use drugs.    Review of Systems         Lipids: Has not been on treatment with a statin drug, levels  are normal without statin drugs  Lab Results  Component Value Date   CHOL 102 04/04/2017   HDL 58.80 04/04/2017   LDLCALC 30 04/04/2017   TRIG 64.0 04/04/2017   CHOLHDL 2 04/04/2017       The blood pressure has been controlled with HCTZ, followed by PCP    History of anemia, she sees her PCP regularly:  Lab Results  Component Value Date   WBC 5.3 06/21/2017   HGB 11.1 (L) 06/21/2017   HCT 34.8 (L) 06/21/2017   MCV 90.2 06/21/2017   PLT PLATELETS APPEAR DECREASED 06/21/2017     NEUROPATHY: On 100 mg gabapentin At bedtime, taking it  regularly otherwise her legs will ache or tingle     Diabetic foot exam in 1/17    She has a history of a small goiter in the past, euthyroid  Lab Results  Component Value Date   TSH 1.96 04/04/2017    Physical Examination:  BP 118/64 (BP Location: Right Arm, Patient Position: Sitting, Cuff Size: Normal)   Pulse 84   Ht 5' 1"  (1.549 m)   Wt 192 lb 6.4 oz (87.3 kg)   SpO2 96%   BMI 36.35 kg/m      ASSESSMENT/ PLAN:   Diabetes type 2 with obesity, relatively mild and well controlled  See history of present illness for  discussion of current diabetes management, blood sugar patterns and problems identified  She appears to have fairly good control now considering her age A1c 6.3  Did not bring her monitor for download and she does not remember her readings She had taken metformin safely before and since her renal function is normal she can take at least 1000 mg a day, discussed that 500 mg once a day is not very effective  Her daughter is going to be involved in her care now including meal planning and medications Discussed checking blood sugars at different times by rotation, no more than one today  Patient Instructions  Check blood sugars on waking up  1/7  Also check blood sugars about  2 hours after a meal and do this after different meals by rotation  Recommended blood sugar levels on waking up is 90-130 and about 2 hours after meal is 130-180  Please bring your blood sugar monitor to each visit, thank you  Add cheese in am     Elayne Snare 08/06/2017, 9:09 PM            Patient ID: Angelica Benson, female   DOB: Jul 06, 1928, 84 y.o.   MRN: [id number]   Reason for Appointment : Followup of diabetes and other issues  History of Present Illness          Diagnosis: Type 2 diabetes mellitus, date of diagnosis: 1992       Past history: She is unclear about the date of her diagnosis and treatment history. At onset she had symptoms of frequent urination, weakness and sleepiness. She has been on various oral hypoglycemic drugs since her diagnosis. Has been on metformin since at least 2006 and Januvia since 2007. Also previously had taken glyburide and Avandia Her A1c has generally been in the 5.9-6.4 range previously  Recent history:   She is on Januvia and her metformin was supposed to be increase back to twice a day but she is still taking it only once a day  She has had fairly consistent control, A1c 6.3  She was given a new glucose monitor on the last visit and  she was probably getting falsely high readings previously Did not bring her monitor for download today She is not having any side effects from metformin but only taking it in the morning Her weight has leveled off  Oral hypoglycemic drugs the patient is taking are: Metformin 500 mg once a day, Januvia 161m      Side effects from medications have been: None  Glucose monitoring:  done once a day or less.   Glucometer: One Touch.       Blood Glucose readings not available today    Hypoglycemia:   none.   Self-care: The diet that the patient has been following is: tries to limit high-fat foods    Meals: 3 meals per day.  at breakfast has oatmeal and toast. At lunch will have sandwich and fruit; dinner  is chicken, starch and vegetables; her snacks are fruit, peanut butter or cheese crackers      Exercise:    will walk around her living area Dietician visit: Most recent: 2000            Compliance with the medical regimen: Good  Weight history:  Wt Readings from Last 3 Encounters:  08/06/17 192 lb 6.4 oz (87.3 kg)  07/23/17 193 lb 6.4 oz (87.7 kg)  07/08/17 197 lb (89.4 kg)   Glycemic control:   Lab Results  Component Value Date   HGBA1C 5.5 08/06/2017   HGBA1C 5.8 04/04/2017   HGBA1C 6.5 11/14/2016   Lab Results  Component Value Date   MICROALBUR <0.7 04/04/2017   LDLCALC 30 04/04/2017   CREATININE 1.00 06/21/2017      Office Visit on 08/06/2017  Component Date Value Ref Range Status  . Hemoglobin A1C 08/06/2017 5.5   Final  . POC Glucose 08/06/2017 97  70 - 99 mg/dl Final    Allergies as of 08/06/2017      Reactions   Buprenorphine Hcl Rash   Morphine And Related Rash      Medication List        Accurate as of 08/06/17  9:09 PM. Always use your most recent med list.          aspirin EC 81 MG tablet Take 81 mg by mouth daily.   atorvastatin 10 MG tablet Commonly known as:  LIPITOR TAKE 1 TABLET BY MOUTH ONCE DAILY   diclofenac sodium 1 % Gel Commonly known as:  VOLTAREN Apply 4 g topically 4 (four) times daily.   DULERA 200-5 MCG/ACT Aero Generic drug:  mometasone-formoterol INHALE ONE PUFF INTO LUNGS EVERY 12 HOURS   fluticasone 50 MCG/ACT nasal spray Commonly known as:  FLONASE INSTILL 2 SPRAYS IN EACH NOSTRIL DAILY   gabapentin 100 MG capsule Commonly known as:  NEURONTIN TAKE 1 CAPSULE BY MOUTH TWICE DAILY   glucose blood test strip Commonly known as:  ACCU-CHEK AVIVA Use as instructed   hydrochlorothiazide 25 MG tablet Commonly known as:  HYDRODIURIL TAKE 1/2 TABLET BY MOUTH DAILY.   meclizine 25 MG tablet Commonly known as:  ANTIVERT Take 1 tablet (25 mg total) by mouth 3 (three) times daily as needed for dizziness.     metFORMIN 500 MG tablet Commonly known as:  GLUCOPHAGE TAKE 1 TABLET BY MOUTH TWICE DAILY   nitroGLYCERIN 0.4 MG SL tablet Commonly known as:  NITROSTAT Place 1 tablet (0.4 mg total) under the tongue every 5 (five) minutes as needed for chest pain.   omeprazole 20 MG capsule Commonly known as:  PRILOSEC TAKE 1 CAPSULE BY  MOUTH EVERY MORNING   ONE TOUCH ULTRA 2 w/Device Kit Use to check blood sugar daily dx code 502.77   ONETOUCH DELICA LANCETS 41O Misc USE AS DIRECTED TO CHECK BLOOD SUGAR EVERY DAY Dx code E11.9   potassium chloride SA 20 MEQ tablet Commonly known as:  K-DUR,KLOR-CON TAKE 1 TABLET BY MOUTH EVERY DAY WITH FOOD   PROAIR HFA 108 (90 Base) MCG/ACT inhaler Generic drug:  albuterol Inhale 2 puffs into the lungs daily as needed for wheezing or shortness of breath.   sitaGLIPtin 100 MG tablet Commonly known as:  JANUVIA Take 1 tablet (100 mg total) by mouth 1 day or 1 dose for 1 dose.   triamcinolone cream 0.5 % Commonly known as:  KENALOG APPLY TOPICALLY TWICE DAILY AS NEEDED FOR RASHES   Vitamin D (Ergocalciferol) 50000 units Caps capsule Commonly known as:  DRISDOL TAKE 1 CAPSULE BY MOUTH ONCE A WEEK       Allergies:  Allergies  Allergen Reactions  . Buprenorphine Hcl Rash  . Morphine And Related Rash    Past Medical History:  Diagnosis Date  . Arthritis   . Asthma   . CHF (congestive heart failure) ([place])   . Chronic headaches   . Constipation   . Diabetes mellitus without complication ([place])   . Hemorrhoids without complication   . Hypertension     Past Surgical History:  Procedure Laterality Date  . ABDOMINAL HYSTERECTOMY    . BACK SURGERY    . CHOLECYSTECTOMY    . KNEE SURGERY      Family History  Problem Relation Age of Onset  . Diabetes Son   . Hypertension Son   . Cancer Sister        breast  . Diabetes Daughter        plus 2 grandkids  . Stroke Son        47moage dx, has enlarged heart  . Heart disease Son         passed away    Social History:  reports that she has never smoked. She has never used smokeless tobacco. She reports that she does not drink alcohol or use drugs.    Review of Systems         Lipids: Has not been on treatment with a statin drug, levels are normal without statin drugs  Lab Results  Component Value Date   CHOL 102 04/04/2017   HDL 58.80 04/04/2017   LDLCALC 30 04/04/2017   TRIG 64.0 04/04/2017   CHOLHDL 2 04/04/2017       The blood pressure has been controlled with HCTZ, followed by PCP     Lab Results  Component Value Date   CREATININE 1.00 06/21/2017   BUN 17 06/21/2017   NA 137 06/21/2017   K 4.1 06/21/2017   CL 104 06/21/2017   CO2 23 06/21/2017        NEUROPATHY: On 100 mg gabapentin for treatment of tingling and aching in her legs  Diabetic foot exam in 8/18   She has a history of a small goiter, euthyroid  Lab Results  Component Value Date   TSH 1.96 04/04/2017    Physical Examination:  BP 118/64 (BP Location: Right Arm, Patient Position: Sitting, Cuff Size: Normal)   Pulse 84   Ht 5' 1"  (1.549 m)   Wt 192 lb 6.4 oz (87.3 kg)   SpO2 96%   BMI 36.35 kg/m     ASSESSMENT/ PLAN:   Diabetes type 2  with obesity, relatively mild and well controlled   See history of present illness for  discussion of current diabetes management, blood sugar patterns and problems identified  She has excellent control on Januvia and low-dose metformin Most recent renal function is also normal Blood sugar appears to be excellent with A1c 5.5 although A1c appears to be lower than expected and lower than before  Discussed using balanced meals especially at breakfast and she can at least have a slice of cheese in addition to her toast and oatmeal and not eat high carbohydrate meals or snacks Today her family member was educated on how to check blood sugar at home with the Accu-Chek guide meter  Labs: She will have follow-up labs on the next visit,  currently up-to-date.   Follow-up in 4 months  Patient Instructions  Check blood sugars on waking up  1/7  Also check blood sugars about 2 hours after a meal and do this after different meals by rotation  Recommended blood sugar levels on waking up is 90-130 and about 2 hours after meal is 130-180  Please bring your blood sugar monitor to each visit, thank you  Add cheese in am     Elayne Snare 08/06/2017, 9:09 PM

## 2017-08-06 NOTE — Patient Instructions (Addendum)
Check blood sugars on waking up  1/7  Also check blood sugars about 2 hours after a meal and do this after different meals by rotation  Recommended blood sugar levels on waking up is 90-130 and about 2 hours after meal is 130-180  Please bring your blood sugar monitor to each visit, thank you  Add cheese in am

## 2017-08-12 ENCOUNTER — Other Ambulatory Visit: Payer: Self-pay | Admitting: Family

## 2017-08-15 ENCOUNTER — Encounter: Payer: Self-pay | Admitting: Family

## 2017-08-15 ENCOUNTER — Ambulatory Visit (INDEPENDENT_AMBULATORY_CARE_PROVIDER_SITE_OTHER): Payer: Medicare Other | Admitting: Family

## 2017-08-15 VITALS — BP 142/55 | HR 67 | Temp 97.5°F | Resp 16 | Ht 61.0 in | Wt 189.4 lb

## 2017-08-15 DIAGNOSIS — R42 Dizziness and giddiness: Secondary | ICD-10-CM

## 2017-08-15 DIAGNOSIS — M545 Low back pain, unspecified: Secondary | ICD-10-CM

## 2017-08-15 DIAGNOSIS — E11649 Type 2 diabetes mellitus with hypoglycemia without coma: Secondary | ICD-10-CM | POA: Diagnosis not present

## 2017-08-15 DIAGNOSIS — E162 Hypoglycemia, unspecified: Secondary | ICD-10-CM | POA: Diagnosis not present

## 2017-08-15 LAB — GLUCOSE, POCT (MANUAL RESULT ENTRY)
POC Glucose: 52 mg/dl — AB (ref 70–99)
POC Glucose: 88 mg/dl (ref 70–99)

## 2017-08-15 LAB — POCT CBG (FASTING - GLUCOSE)-MANUAL ENTRY: Glucose Fasting, POC: 44 mg/dL — AB (ref 70–99)

## 2017-08-15 MED ORDER — ACETAMINOPHEN 500 MG PO TABS: 1000.0000 mg | ORAL_TABLET | Freq: Two times a day (BID) | ORAL | 0 refills | Status: DC | PRN

## 2017-08-15 NOTE — Progress Notes (Signed)
Subjective:    Patient ID: Angelica Benson, female    DOB: 1929/03/12, 84 y.o.   MRN: [id number]  HPI   Patient is an 84 yr old female who presents today with c/o back pain. Pain has bene present x 3 weeks. Reports that she has also had recent constipation which has been relieved by prune juice.  Lab Results  Component Value Date   HGBA1C 5.5 08/06/2017   HGBA1C 5.8 04/04/2017   HGBA1C 6.5 11/14/2016   Lab Results  Component Value Date   MICROALBUR <0.7 04/04/2017   LDLCALC 30 04/04/2017   CREATININE 1.00 06/21/2017   Reports severe back pain  In the lower back.  Especially hurts to get in/out of bed or if she goes over a bump on the road.  Placing a lumbar pillow in her lift chair which helps.  Takes tylenol once in the evening.  Thinks that this helps.    Reports low energy. Not sleeping well      Review of Systems    see HPI  Past Medical History:  Diagnosis Date  . Arthritis   . Asthma   . CHF (congestive heart failure) ([place])   . Chronic headaches   . Constipation   . Diabetes mellitus without complication ([place])   . Hemorrhoids without complication   . Hypertension      Social History   Socioeconomic History  . Marital status: Single    Spouse name: Not on file  . Number of children: Not on file  . Years of education: Not on file  . Highest education level: Not on file  Occupational History  . Not on file  Social Needs  . Financial resource strain: Not on file  . Food insecurity:    Worry: Not on file    Inability: Not on file  . Transportation needs:    Medical: Not on file    Non-medical: Not on file  Tobacco Use  . Smoking status: Never Smoker  . Smokeless tobacco: Never Used  Substance and Sexual Activity  . Alcohol use: No  . Drug use: No  . Sexual activity: Not on file  Lifestyle  . Physical activity:    Days per week: Not on file    Minutes per session: Not on file  . Stress: Not on file  Relationships  . Social connections:   Talks on phone: Not on file    Gets together: Not on file    Attends religious service: Not on file    Active member of club or organization: Not on file    Attends meetings of clubs or organizations: Not on file    Relationship status: Not on file  . Intimate partner violence:    Fear of current or ex partner: Not on file    Emotionally abused: Not on file    Physically abused: Not on file    Forced sexual activity: Not on file  Other Topics Concern  . Not on file  Social History Narrative   Lives at home by herself, aide in the am.     Has 4 daughters and 3 living sons (one son died).    Uses walker.     Divorced at age 27    Past Surgical History:  Procedure Laterality Date  . ABDOMINAL HYSTERECTOMY    . BACK SURGERY    . CHOLECYSTECTOMY    . KNEE SURGERY      Family History  Problem Relation Age of Onset  .  Diabetes Son   . Hypertension Son   . Cancer Sister        breast  . Diabetes Daughter        plus 2 grandkids  . Stroke Son        110moage dx, has enlarged heart  . Heart disease Son        passed away    Allergies  Allergen Reactions  . Buprenorphine Hcl Rash  . Morphine And Related Rash    Current Outpatient Medications on File Prior to Visit  Medication Sig Dispense Refill  . albuterol (PROAIR HFA) 108 (90 Base) MCG/ACT inhaler Inhale 2 puffs into the lungs daily as needed for wheezing or shortness of breath.    .Marland Kitchenaspirin EC 81 MG tablet Take 81 mg by mouth daily.    .Marland Kitchenatorvastatin (LIPITOR) 10 MG tablet TAKE 1 TABLET BY MOUTH ONCE DAILY 90 tablet 0  . Blood Glucose Monitoring Suppl (ONE TOUCH ULTRA 2) w/Device KIT Use to check blood sugar daily dx code 250.00 1 each 0  . diclofenac sodium (VOLTAREN) 1 % GEL Apply 4 g topically 4 (four) times daily. 100 g 0  . DULERA 200-5 MCG/ACT AERO INHALE ONE PUFF INTO LUNGS EVERY 12 HOURS 1 Inhaler 5  . fluticasone (FLONASE) 50 MCG/ACT nasal spray INSTILL 2 SPRAYS IN EACH NOSTRIL DAILY 16 g 1  . gabapentin  (NEURONTIN) 100 MG capsule TAKE 1 CAPSULE BY MOUTH TWICE DAILY (Patient taking differently: TAKE 1 (100 MG) CAPSULE BY MOUTH TWICE DAILY) 60 capsule 5  . glucose blood (ACCU-CHEK AVIVA) test strip Use as instructed 100 each 12  . hydrochlorothiazide (HYDRODIURIL) 25 MG tablet TAKE 1/2 TABLET BY MOUTH DAILY. (Patient taking differently: TAKE 1/2 (12.5 MG) TABLET BY MOUTH DAILY.) 15 tablet 3  . JANUVIA 100 MG tablet TAKE 1 TABLET BY MOUTH ONCE DAILY. 30 tablet 5  . meclizine (ANTIVERT) 25 MG tablet Take 1 tablet (25 mg total) by mouth 3 (three) times daily as needed for dizziness. 30 tablet 0  . metFORMIN (GLUCOPHAGE) 500 MG tablet TAKE 1 TABLET BY MOUTH TWICE DAILY (Patient taking differently: TAKE 1 TABLET (500 MG) BY MOUTH TWICE DAILY) 60 tablet 5  . nitroGLYCERIN (NITROSTAT) 0.4 MG SL tablet Place 1 tablet (0.4 mg total) under the tongue every 5 (five) minutes as needed for chest pain. 20 tablet 0  . omeprazole (PRILOSEC) 20 MG capsule TAKE 1 CAPSULE BY MOUTH EVERY MORNING (Patient taking differently: TAKE 1 CAPSULE (20 MG) BY MOUTH EVERY MORNING) 30 capsule 5  . ONETOUCH DELICA LANCETS 342AMISC USE AS DIRECTED TO CHECK BLOOD SUGAR EVERY DAY Dx code E11.9 100 each 1  . potassium chloride SA (K-DUR,KLOR-CON) 20 MEQ tablet TAKE 1 TABLET BY MOUTH EVERY DAY WITH FOOD (Patient taking differently: TAKE 1 TABLET  (20 MEQ) BY MOUTH EVERY DAY WITH FOOD) 30 tablet 5  . triamcinolone cream (KENALOG) 0.5 % APPLY TOPICALLY TWICE DAILY AS NEEDED FOR RASHES 120 g 1  . Vitamin D, Ergocalciferol, (DRISDOL) 50000 units CAPS capsule TAKE 1 CAPSULE BY MOUTH ONCE A WEEK (Patient taking differently: TAKE 1 CAPSULE (50,000 UNITS) BY MOUTH ONCE A WEEK) 4 capsule 7   No current facility-administered medications on file prior to visit.     BP (!) 142/55 (BP Location: Right Arm, Cuff Size: Normal)   Pulse 67   Temp (!) 97.5 F (36.4 C) (Oral)   Resp 16   Ht 5' 1"  (1.549 m)   Wt 189 lb  6.4 oz (85.9 kg)   BMI 35.79  kg/m    Objective:   Physical Exam  Constitutional: She appears well-developed and well-nourished.  Cardiovascular: Normal rate, regular rhythm and normal heart sounds.  No murmur heard. Pulmonary/Chest: Effort normal and breath sounds normal. No respiratory distress. She has no wheezes.  Musculoskeletal: She exhibits no edema.       Cervical back: She exhibits no tenderness.  Psychiatric: She has a normal mood and affect. Her behavior is normal. Judgment and thought content normal.          Assessment & Plan:  Low back pain- avoid nsaids due to renal insufficiency. I gave patient some exercises to do in the bed.  Advised her to avoid the pushups/cat-cow.  Add standing tylenol 1015m bid.   DM2- had hypoglycemia when she came in. Was given 2 bottles of apple juice in all and a glucose tablet. A1C is good. Will d/c januria as I think she is overtreated.

## 2017-08-15 NOTE — Patient Instructions (Addendum)
Stop januvia due to low sugar.   Back Exercises The following exercises strengthen the muscles that help to support the back. They also help to keep the lower back flexible. Doing these exercises can help to prevent back pain or lessen existing pain. If you have back pain or discomfort, try doing these exercises 2-3 times each day or as told by your health care provider. When the pain goes away, do them once each day, but increase the number of times that you repeat the steps for each exercise (do more repetitions). If you do not have back pain or discomfort, do these exercises once each day or as told by your health care provider. Exercises Single Knee to Chest  Repeat these steps 3-5 times for each leg: 1. Lie on your back on a firm bed or the floor with your legs extended. 2. Bring one knee to your chest. Your other leg should stay extended and in contact with the floor. 3. Hold your knee in place by grabbing your knee or thigh. 4. Pull on your knee until you feel a gentle stretch in your lower back. 5. Hold the stretch for 10-30 seconds. 6. Slowly release and straighten your leg.  Pelvic Tilt  Repeat these steps 5-10 times: 1. Lie on your back on a firm bed or the floor with your legs extended. 2. Bend your knees so they are pointing toward the ceiling and your feet are flat on the floor. 3. Tighten your lower abdominal muscles to press your lower back against the floor. This motion will tilt your pelvis so your tailbone points up toward the ceiling instead of pointing to your feet or the floor. 4. With gentle tension and even breathing, hold this position for 5-10 seconds.  Cat-Cow  Repeat these steps until your lower back becomes more flexible: 1. Get into a hands-and-knees position on a firm surface. Keep your hands under your shoulders, and keep your knees under your hips. You may place padding under your knees for comfort. 2. Let your head hang down, and point your tailbone  toward the floor so your lower back becomes rounded like the back of a cat. 3. Hold this position for 5 seconds. 4. Slowly lift your head and point your tailbone up toward the ceiling so your back forms a sagging arch like the back of a cow. 5. Hold this position for 5 seconds.  Press-Ups  Repeat these steps 5-10 times: 1. Lie on your abdomen (face-down) on the floor. 2. Place your palms near your head, about shoulder-width apart. 3. While you keep your back as relaxed as possible and keep your hips on the floor, slowly straighten your arms to raise the top half of your body and lift your shoulders. Do not use your back muscles to raise your upper torso. You may adjust the placement of your hands to make yourself more comfortable. 4. Hold this position for 5 seconds while you keep your back relaxed. 5. Slowly return to lying flat on the floor.  Bridges  Repeat these steps 10 times: 1. Lie on your back on a firm surface. 2. Bend your knees so they are pointing toward the ceiling and your feet are flat on the floor. 3. Tighten your buttocks muscles and lift your buttocks off of the floor until your waist is at almost the same height as your knees. You should feel the muscles working in your buttocks and the back of your thighs. If you do not feel these  muscles, slide your feet 1-2 inches farther away from your buttocks. 4. Hold this position for 3-5 seconds. 5. Slowly lower your hips to the starting position, and allow your buttocks muscles to relax completely.  If this exercise is too easy, try doing it with your arms crossed over your chest. Abdominal Crunches  Repeat these steps 5-10 times: 1. Lie on your back on a firm bed or the floor with your legs extended. 2. Bend your knees so they are pointing toward the ceiling and your feet are flat on the floor. 3. Cross your arms over your chest. 4. Tip your chin slightly toward your chest without bending your neck. 5. Tighten your  abdominal muscles and slowly raise your trunk (torso) high enough to lift your shoulder blades a tiny bit off of the floor. Avoid raising your torso higher than that, because it can put too much stress on your low back and it does not help to strengthen your abdominal muscles. 6. Slowly return to your starting position.  Back Lifts Repeat these steps 5-10 times: 1. Lie on your abdomen (face-down) with your arms at your sides, and rest your forehead on the floor. 2. Tighten the muscles in your legs and your buttocks. 3. Slowly lift your chest off of the floor while you keep your hips pressed to the floor. Keep the back of your head in line with the curve in your back. Your eyes should be looking at the floor. 4. Hold this position for 3-5 seconds. 5. Slowly return to your starting position.  Contact a health care provider if:  Your back pain or discomfort gets much worse when you do an exercise.  Your back pain or discomfort does not lessen within 2 hours after you exercise. If you have any of these problems, stop doing these exercises right away. Do not do them again unless your health care provider says that you can. Get help right away if:  You develop sudden, severe back pain. If this happens, stop doing the exercises right away. Do not do them again unless your health care provider says that you can. This information is not intended to replace advice given to you by your health care provider. Make sure you discuss any questions you have with your health care provider. Document Released: 05/09/2004 Document Revised: 08/09/2015 Document Reviewed: 05/26/2014 Elsevier Interactive Patient Education  2017 [employer].

## 2017-08-18 ENCOUNTER — Telehealth: Payer: Self-pay | Admitting: Family

## 2017-08-18 NOTE — Telephone Encounter (Signed)
Spoke with pt's daughter. Advised her that Tylenol would be OTC and pt would need to take 2 of the 500mg  tablets twice a day per last OV. Daughter voices understanding and states she got confused previously and thought we were sending a prescription.

## 2017-08-18 NOTE — Telephone Encounter (Signed)
Copied from [place]. Topic: Quick Communication - See Telephone Encounter >> Aug 18, 2017  1:51 PM Aurelio Brash B wrote: CRM for notification. See Telephone encounter for: 08/18/17.  Pt was to get a rx for acetaminophen (TYLENOL) 500 MG tablet    [facility], [place] [phone] (Phone) [phone] (Fax)

## 2017-08-19 ENCOUNTER — Ambulatory Visit: Payer: Medicare Other | Admitting: Family

## 2017-09-10 ENCOUNTER — Telehealth: Payer: Self-pay

## 2017-09-10 NOTE — Telephone Encounter (Signed)
Left message for Home Health Nurse that Aspirin has been called in to the Mail Pharmacy for patient.

## 2017-09-10 NOTE — Telephone Encounter (Signed)
[facility] phone call. Angelica Benson called to clarify if patient is supposed to take Aspirin 81 mg daily. Please advise.

## 2017-09-10 NOTE — Telephone Encounter (Signed)
Yes please

## 2017-09-13 ENCOUNTER — Encounter (HOSPITAL_COMMUNITY): Payer: Self-pay | Admitting: *Deleted

## 2017-09-13 ENCOUNTER — Other Ambulatory Visit: Payer: Self-pay

## 2017-09-13 ENCOUNTER — Emergency Department (HOSPITAL_COMMUNITY)
Admission: EM | Admit: 2017-09-13 | Discharge: 2017-09-13 | Disposition: A | Discharge status: Home / Self Care | Payer: Medicare Other | Attending: Emergency Medicine | Admitting: Emergency Medicine

## 2017-09-13 DIAGNOSIS — I10 Essential (primary) hypertension: Secondary | ICD-10-CM | POA: Diagnosis not present

## 2017-09-13 DIAGNOSIS — M545 Low back pain: Secondary | ICD-10-CM | POA: Diagnosis present

## 2017-09-13 DIAGNOSIS — Y999 Unspecified external cause status: Secondary | ICD-10-CM | POA: Insufficient documentation

## 2017-09-13 DIAGNOSIS — E119 Type 2 diabetes mellitus without complications: Secondary | ICD-10-CM | POA: Diagnosis not present

## 2017-09-13 DIAGNOSIS — Z79899 Other long term (current) drug therapy: Secondary | ICD-10-CM | POA: Diagnosis not present

## 2017-09-13 DIAGNOSIS — Y929 Unspecified place or not applicable: Secondary | ICD-10-CM | POA: Insufficient documentation

## 2017-09-13 DIAGNOSIS — I509 Heart failure, unspecified: Secondary | ICD-10-CM | POA: Insufficient documentation

## 2017-09-13 DIAGNOSIS — X58XXXA Exposure to other specified factors, initial encounter: Secondary | ICD-10-CM | POA: Diagnosis not present

## 2017-09-13 DIAGNOSIS — Z7984 Long term (current) use of oral hypoglycemic drugs: Secondary | ICD-10-CM | POA: Diagnosis not present

## 2017-09-13 DIAGNOSIS — Y939 Activity, unspecified: Secondary | ICD-10-CM | POA: Insufficient documentation

## 2017-09-13 DIAGNOSIS — S39012A Strain of muscle, fascia and tendon of lower back, initial encounter: Secondary | ICD-10-CM | POA: Diagnosis not present

## 2017-09-13 MED ORDER — DICLOFENAC EPOLAMINE 1.3 % TD PTCH: 1.0000 | MEDICATED_PATCH | Freq: Two times a day (BID) | TRANSDERMAL | 1 refills | Status: DC

## 2017-09-13 MED ORDER — DICLOFENAC EPOLAMINE 1.3 % TD PTCH
1.0000 | MEDICATED_PATCH | Freq: Once | TRANSDERMAL | Status: DC
  Administered 2017-09-13: 1 via TRANSDERMAL
  Filled 2017-09-13: qty 1

## 2017-09-13 NOTE — ED Provider Notes (Signed)
[place] DEPT Provider Note   CSN: [id number] Arrival date & time: 09/13/17  0908     History   Chief Complaint Chief Complaint  Patient presents with  . Back Pain    HPI Angelica Benson is a 84 y.o. female.  HPI Patient presents with ongoing back pain. She is here with her daughter who assists with the HPI. She notes a long history of back pain, has seen her physician, is currently using Tylenol for pain relief, though has continued pain throughout the lower back, worse with activity. She denies new changes including fever, abdominal pain, nausea, incontinence, asymmetric weakness in her extremities. Patient notes that yesterday she was particularly active, going to a graduation ceremony, and feels as her pain is worse since that time. Past Medical History:  Diagnosis Date  . Arthritis   . Asthma   . CHF (congestive heart failure) ([place])   . Chronic headaches   . Constipation   . Diabetes mellitus without complication ([place])   . Hemorrhoids without complication   . Hypertension     Patient Active Problem List   Diagnosis Date Noted  . Arthritis 02/19/2017  . Hypertension   . Diabetes mellitus without complication ([place])   . Chronic headaches   . CHF (congestive heart failure) ([place])   . Asthma   . Carpal tunnel syndrome 09/04/2015  . Cervical disc disorder with radiculopathy of cervical region   . TIA (transient ischemic attack) 03/22/2015  . Obesity, unspecified 08/18/2013  . Goiter 08/18/2013  . Anemia, chronic disease 08/18/2013    Past Surgical History:  Procedure Laterality Date  . ABDOMINAL HYSTERECTOMY    . BACK SURGERY    . CHOLECYSTECTOMY    . KNEE SURGERY       OB History   None      Home Medications    Prior to Admission medications   Medication Sig Start Date End Date Taking? Authorizing Provider  acetaminophen (TYLENOL) 500 MG tablet Take 2 tablets (1,000 mg total) by mouth 2 (two) times daily as needed  for moderate pain. 08/15/17   Debbrah Alar, NP  albuterol (PROAIR HFA) 108 (90 Base) MCG/ACT inhaler Inhale 2 puffs into the lungs daily as needed for wheezing or shortness of breath.    [provider]  aspirin EC 81 MG tablet Take 81 mg by mouth daily.    [provider]  atorvastatin (LIPITOR) 10 MG tablet TAKE 1 TABLET BY MOUTH ONCE DAILY 07/16/17   Debbrah Alar, NP  Blood Glucose Monitoring Suppl (ONE TOUCH ULTRA 2) w/Device KIT Use to check blood sugar daily dx code 250.00 07/03/16   Debbrah Alar, NP  diclofenac (FLECTOR) 1.3 % PTCH Place 1 patch onto the skin 2 (two) times daily. 09/13/17   Carmin Muskrat, MD  diclofenac sodium (VOLTAREN) 1 % GEL Apply 4 g topically 4 (four) times daily. 01/21/17   Forde Dandy, MD  DULERA 200-5 MCG/ACT AERO INHALE ONE PUFF INTO LUNGS EVERY 12 HOURS 08/13/17   Debbrah Alar, NP  fluticasone [facility]) 50 MCG/ACT nasal spray INSTILL 2 SPRAYS IN EACH NOSTRIL DAILY 06/25/17   Debbrah Alar, NP  gabapentin (NEURONTIN) 100 MG capsule TAKE 1 CAPSULE BY MOUTH TWICE DAILY Patient taking differently: TAKE 1 (100 MG) CAPSULE BY MOUTH TWICE DAILY 05/02/17   Debbrah Alar, NP  glucose blood (ACCU-CHEK AVIVA) test strip Use as instructed 07/23/17   Debbrah Alar, NP  hydrochlorothiazide (HYDRODIURIL) 25 MG tablet TAKE 1/2 TABLET BY MOUTH  DAILY. Patient taking differently: TAKE 1/2 (12.5 MG) TABLET BY MOUTH DAILY. 05/13/17   Debbrah Alar, NP  meclizine (ANTIVERT) 25 MG tablet Take 1 tablet (25 mg total) by mouth 3 (three) times daily as needed for dizziness. 07/08/17   Debbrah Alar, NP  metFORMIN (GLUCOPHAGE) 500 MG tablet TAKE 1 TABLET BY MOUTH TWICE DAILY Patient taking differently: TAKE 1 TABLET (500 MG) BY MOUTH TWICE DAILY 03/17/17   Debbrah Alar, NP  nitroGLYCERIN (NITROSTAT) 0.4 MG SL tablet Place 1 tablet (0.4 mg total) under the tongue every 5 (five) minutes as needed for chest pain. 05/21/17    Debbrah Alar, NP  omeprazole (PRILOSEC) 20 MG capsule TAKE 1 CAPSULE BY MOUTH EVERY MORNING Patient taking differently: TAKE 1 CAPSULE (20 MG) BY MOUTH EVERY MORNING 05/02/17   Debbrah Alar, NP  [facility] DELICA LANCETS 07E MISC USE AS DIRECTED TO CHECK BLOOD SUGAR EVERY DAY Dx code E11.9 01/15/17   Debbrah Alar, NP  potassium chloride SA (K-DUR,KLOR-CON) 20 MEQ tablet TAKE 1 TABLET BY MOUTH EVERY DAY WITH FOOD Patient taking differently: TAKE 1 TABLET  (20 MEQ) BY MOUTH EVERY DAY WITH FOOD 05/02/17   Debbrah Alar, NP  triamcinolone cream (KENALOG) 0.5 % APPLY TOPICALLY TWICE DAILY AS NEEDED FOR RASHES 06/25/17   Debbrah Alar, NP  Vitamin D, Ergocalciferol, (DRISDOL) 50000 units CAPS capsule TAKE 1 CAPSULE BY MOUTH ONCE A WEEK Patient taking differently: TAKE 1 CAPSULE (50,000 UNITS) BY MOUTH ONCE A WEEK 04/07/17   Debbrah Alar, NP    Family History Family History  Problem Relation Age of Onset  . Diabetes Son   . Hypertension Son   . Cancer Sister        breast  . Diabetes Daughter        plus 2 grandkids  . Stroke Son        73moage dx, has enlarged heart  . Heart disease Son        passed away    Social History Social History   Tobacco Use  . Smoking status: Never Smoker  . Smokeless tobacco: Never Used  Substance Use Topics  . Alcohol use: No  . Drug use: No     Allergies   Buprenorphine hcl and Morphine and related   Review of Systems Review of Systems  Constitutional:       Per HPI, otherwise negative  HENT:       Per HPI, otherwise negative  Respiratory:       Per HPI, otherwise negative  Cardiovascular:       Per HPI, otherwise negative  Gastrointestinal: Negative for vomiting.  Endocrine:       Negative aside from HPI  Genitourinary:       Neg aside from HPI   Musculoskeletal:       Per HPI, otherwise negative  Skin: Negative.   Neurological: Negative for syncope.     Physical Exam Updated Vital Signs BP  (!) 130/52 (BP Location: Left Arm)   Pulse 64   Temp 97.9 F (36.6 C) (Oral)   Resp 16   Ht 5' (1.524 m)   Wt 85.7 kg (189 lb)   SpO2 99%   BMI 36.91 kg/m   Physical Exam  Constitutional: She is oriented to person, place, and time. She appears well-developed and well-nourished. No distress.  HENT:  Head: Normocephalic and atraumatic.  Eyes: Conjunctivae and EOM are normal.  Cardiovascular: Normal rate and regular rhythm.  Pulmonary/Chest: Effort normal and breath sounds normal.  No stridor. No respiratory distress.  Abdominal: She exhibits no distension.  Musculoskeletal: She exhibits edema. She exhibits no deformity.  Neurological: She is alert and oriented to person, place, and time. No cranial nerve deficit.  Patient has some pain in her right knee, but flexes each hip independently, with some pain referred to the lower back with bilateral motion, and pain in the right knee with motion, but no gross deformities  Skin: Skin is warm and dry.  Psychiatric: She has a normal mood and affect.  Nursing note and vitals reviewed.    ED Treatments / Results   Procedures Procedures (including critical care time)  Medications Ordered in ED Medications  diclofenac (FLECTOR) 1.3 % 1 patch (has no administration in time range)     Initial Impression / Assessment and Plan / ED Course  I have reviewed the triage vital signs and the nursing notes.  Pertinent labs & imaging results that were available during my care of the patient were reviewed by me and considered in my medical decision making (see chart for details).  Elderly female presents with ongoing pain low back. Patient is new evidence for new neurologic dysfunction, no abdominal pain, is otherwise well-appearing. Patient was encouraged to continue using Tylenol, provided diclofenac topical patches for additional relief, will follow-up with primary care for additional consideration of analgesic regimen, physical  therapy.   Final Clinical Impressions(s) / ED Diagnoses   Final diagnoses:  Strain of lumbar region, initial encounter    ED Discharge Orders        Ordered    diclofenac (FLECTOR) 1.3 % PTCH  2 times daily     09/13/17 1037       Carmin Muskrat, MD 09/13/17 1040

## 2017-09-13 NOTE — Discharge Instructions (Signed)
As discussed, your evaluation today has been largely reassuring.  But, it is important that you monitor your condition carefully, and do not hesitate to return to the ED if you develop new, or concerning changes in your condition. ? ?Otherwise, please follow-up with your physician for appropriate ongoing care. ? ?

## 2017-09-13 NOTE — ED Triage Notes (Addendum)
Pt has chronic back pain, went to grandsons graduation yesterday and thinks moving around more has made it flair up. 168/80-88-97% CBG 107

## 2017-09-13 NOTE — ED Notes (Signed)
Bed: LF81 Expected date:  Expected time:  Means of arrival:  Comments: 84 yo back pain

## 2017-09-23 ENCOUNTER — Ambulatory Visit: Payer: Medicare Other | Admitting: Family

## 2017-09-24 ENCOUNTER — Encounter: Payer: Self-pay | Admitting: Family

## 2017-09-24 ENCOUNTER — Ambulatory Visit (INDEPENDENT_AMBULATORY_CARE_PROVIDER_SITE_OTHER): Payer: Medicare Other | Admitting: Family

## 2017-09-24 VITALS — BP 110/44 | HR 75 | Temp 97.6°F | Resp 24 | Ht 61.0 in | Wt 186.8 lb

## 2017-09-24 DIAGNOSIS — M5416 Radiculopathy, lumbar region: Secondary | ICD-10-CM

## 2017-09-24 NOTE — Progress Notes (Signed)
Subjective:    Patient ID: Angelica Benson, female    DOB: [birth year], 84 y.o.   MRN: [id number]  HPI  Angelica Benson is an 84 yr old female who presents today to discuss low back pain. Pain has ben present since the beginning of May. We recommended exercises last visit. Symptoms have not improved.  Pain is worst in the mid-lower back and radiates around to her left hip.    Review of Systems See HPI  Past Medical History:  Diagnosis Date  . Arthritis   . Asthma   . CHF (congestive heart failure) ([place])   . Chronic headaches   . Constipation   . Diabetes mellitus without complication ([place])   . Hemorrhoids without complication   . Hypertension      Social History   Socioeconomic History  . Marital status: Single    Spouse name: Not on file  . Number of children: Not on file  . Years of education: Not on file  . Highest education level: Not on file  Occupational History  . Not on file  Social Needs  . Financial resource strain: Not on file  . Food insecurity:    Worry: Not on file    Inability: Not on file  . Transportation needs:    Medical: Not on file    Non-medical: Not on file  Tobacco Use  . Smoking status: Never Smoker  . Smokeless tobacco: Never Used  Substance and Sexual Activity  . Alcohol use: No  . Drug use: No  . Sexual activity: Not on file  Lifestyle  . Physical activity:    Days per week: Not on file    Minutes per session: Not on file  . Stress: Not on file  Relationships  . Social connections:    Talks on phone: Not on file    Gets together: Not on file    Attends religious service: Not on file    Active member of club or organization: Not on file    Attends meetings of clubs or organizations: Not on file    Relationship status: Not on file  . Intimate partner violence:    Fear of current or ex partner: Not on file    Emotionally abused: Not on file    Physically abused: Not on file    Forced sexual activity: Not on file  Other Topics  Concern  . Not on file  Social History Narrative   Lives at home by herself, aide in the am.     Has 4 daughters and 3 living sons (one son died).    Uses walker.     Divorced at age 27    Past Surgical History:  Procedure Laterality Date  . ABDOMINAL HYSTERECTOMY    . BACK SURGERY    . CHOLECYSTECTOMY    . KNEE SURGERY      Family History  Problem Relation Age of Onset  . Diabetes Son   . Hypertension Son   . Cancer Sister        breast  . Diabetes Daughter        plus 2 grandkids  . Stroke Son        74moage dx, has enlarged heart  . Heart disease Son        passed away    Allergies  Allergen Reactions  . Buprenorphine Hcl Rash  . Morphine And Related Rash    Current Outpatient Medications on File Prior to Visit  Medication Sig Dispense Refill  . acetaminophen (TYLENOL) 500 MG tablet Take 2 tablets (1,000 mg total) by mouth 2 (two) times daily as needed for moderate pain. 30 tablet 0  . albuterol (PROAIR HFA) 108 (90 Base) MCG/ACT inhaler Inhale 2 puffs into the lungs daily as needed for wheezing or shortness of breath.    Marland Kitchen aspirin EC 81 MG tablet Take 81 mg by mouth daily.    Marland Kitchen atorvastatin (LIPITOR) 10 MG tablet TAKE 1 TABLET BY MOUTH ONCE DAILY 90 tablet 0  . Blood Glucose Monitoring Suppl (ONE TOUCH ULTRA 2) w/Device KIT Use to check blood sugar daily dx code 250.00 1 each 0  . diclofenac (FLECTOR) 1.3 % PTCH Place 1 patch onto the skin 2 (two) times daily. 5 patch 1  . diclofenac sodium (VOLTAREN) 1 % GEL Apply 4 g topically 4 (four) times daily. 100 g 0  . DULERA 200-5 MCG/ACT AERO INHALE ONE PUFF INTO LUNGS EVERY 12 HOURS 1 Inhaler 5  . fluticasone (FLONASE) 50 MCG/ACT nasal spray INSTILL 2 SPRAYS IN EACH NOSTRIL DAILY 16 g 1  . gabapentin (NEURONTIN) 100 MG capsule TAKE 1 CAPSULE BY MOUTH TWICE DAILY (Patient taking differently: TAKE 1 (100 MG) CAPSULE BY MOUTH TWICE DAILY) 60 capsule 5  . glucose blood (ACCU-CHEK AVIVA) test strip Use as instructed 100  each 12  . hydrochlorothiazide (HYDRODIURIL) 25 MG tablet TAKE 1/2 TABLET BY MOUTH DAILY. (Patient taking differently: TAKE 1/2 (12.5 MG) TABLET BY MOUTH DAILY.) 15 tablet 3  . meclizine (ANTIVERT) 25 MG tablet Take 1 tablet (25 mg total) by mouth 3 (three) times daily as needed for dizziness. 30 tablet 0  . metFORMIN (GLUCOPHAGE) 500 MG tablet TAKE 1 TABLET BY MOUTH TWICE DAILY (Patient taking differently: TAKE 1 TABLET (500 MG) BY MOUTH TWICE DAILY) 60 tablet 5  . nitroGLYCERIN (NITROSTAT) 0.4 MG SL tablet Place 1 tablet (0.4 mg total) under the tongue every 5 (five) minutes as needed for chest pain. 20 tablet 0  . omeprazole (PRILOSEC) 20 MG capsule TAKE 1 CAPSULE BY MOUTH EVERY MORNING (Patient taking differently: TAKE 1 CAPSULE (20 MG) BY MOUTH EVERY MORNING) 30 capsule 5  . ONETOUCH DELICA LANCETS 49F MISC USE AS DIRECTED TO CHECK BLOOD SUGAR EVERY DAY Dx code E11.9 100 each 1  . potassium chloride SA (K-DUR,KLOR-CON) 20 MEQ tablet TAKE 1 TABLET BY MOUTH EVERY DAY WITH FOOD (Patient taking differently: TAKE 1 TABLET  (20 MEQ) BY MOUTH EVERY DAY WITH FOOD) 30 tablet 5  . triamcinolone cream (KENALOG) 0.5 % APPLY TOPICALLY TWICE DAILY AS NEEDED FOR RASHES 120 g 1  . Vitamin D, Ergocalciferol, (DRISDOL) 50000 units CAPS capsule TAKE 1 CAPSULE BY MOUTH ONCE A WEEK (Patient taking differently: TAKE 1 CAPSULE (50,000 UNITS) BY MOUTH ONCE A WEEK) 4 capsule 7   No current facility-administered medications on file prior to visit.     BP (!) 110/44 (BP Location: Right Arm, Cuff Size: Normal)   Pulse 75   Temp 97.6 F (36.4 C) (Oral)   Resp (!) 24   Ht _0  (1.549 m)   Wt 186 lb 12.8 oz (84.7 kg)   SpO2 100%   BMI 35.30 kg/m       Objective:   Physical Exam  Constitutional: She appears well-developed and well-nourished.  Cardiovascular: Normal rate, regular rhythm and normal heart sounds.  No murmur heard. Pulmonary/Chest: Effort normal and breath sounds normal. No respiratory distress.  She has no wheezes.  Musculoskeletal:  Bilateral  LE strength is 5/5  Neurological:  Reflex Scores:      Patellar reflexes are 2+ on the right side and 2+ on the left side. Psychiatric: She has a normal mood and affect. Her behavior is normal. Judgment and thought content normal.          Assessment & Plan:  Lumbar radiculopathy- has been present x 6 weeks. Will order MRI for further evaluation. Continue tylenol and aspercreme for now. Avoid nsaids due to renal insufficiency and steroids due to DM2.

## 2017-09-24 NOTE — Patient Instructions (Addendum)
You should be contacted about scheduling your MRI. Please continue tylenol 1000mg  twice daily and as needed aspercreme  Back Exercises The following exercises strengthen the muscles that help to support the back. They also help to keep the lower back flexible. Doing these exercises can help to prevent back pain or lessen existing pain. If you have back pain or discomfort, try doing these exercises 2-3 times each day or as told by your health care provider. When the pain goes away, do them once each day, but increase the number of times that you repeat the steps for each exercise (do more repetitions). If you do not have back pain or discomfort, do these exercises once each day or as told by your health care provider. Exercises Single Knee to Chest  Repeat these steps 3-5 times for each leg: 1. Lie on your back on a firm bed or the floor with your legs extended. 2. Bring one knee to your chest. Your other leg should stay extended and in contact with the floor. 3. Hold your knee in place by grabbing your knee or thigh. 4. Pull on your knee until you feel a gentle stretch in your lower back. 5. Hold the stretch for 10-30 seconds. 6. Slowly release and straighten your leg.  Pelvic Tilt  Repeat these steps 5-10 times: 1. Lie on your back on a firm bed or the floor with your legs extended. 2. Bend your knees so they are pointing toward the ceiling and your feet are flat on the floor. 3. Tighten your lower abdominal muscles to press your lower back against the floor. This motion will tilt your pelvis so your tailbone points up toward the ceiling instead of pointing to your feet or the floor. 4. With gentle tension and even breathing, hold this position for 5-10 seconds.  Cat-Cow  Repeat these steps until your lower back becomes more flexible: 1. Get into a hands-and-knees position on a firm surface. Keep your hands under your shoulders, and keep your knees under your hips. You may place padding  under your knees for comfort. 2. Let your head hang down, and point your tailbone toward the floor so your lower back becomes rounded like the back of a cat. 3. Hold this position for 5 seconds. 4. Slowly lift your head and point your tailbone up toward the ceiling so your back forms a sagging arch like the back of a cow. 5. Hold this position for 5 seconds.  Press-Ups  Repeat these steps 5-10 times: 1. Lie on your abdomen (face-down) on the floor. 2. Place your palms near your head, about shoulder-width apart. 3. While you keep your back as relaxed as possible and keep your hips on the floor, slowly straighten your arms to raise the top half of your body and lift your shoulders. Do not use your back muscles to raise your upper torso. You may adjust the placement of your hands to make yourself more comfortable. 4. Hold this position for 5 seconds while you keep your back relaxed. 5. Slowly return to lying flat on the floor.  Bridges  Repeat these steps 10 times: 1. Lie on your back on a firm surface. 2. Bend your knees so they are pointing toward the ceiling and your feet are flat on the floor. 3. Tighten your buttocks muscles and lift your buttocks off of the floor until your waist is at almost the same height as your knees. You should feel the muscles working in your buttocks and  the back of your thighs. If you do not feel these muscles, slide your feet 1-2 inches farther away from your buttocks. 4. Hold this position for 3-5 seconds. 5. Slowly lower your hips to the starting position, and allow your buttocks muscles to relax completely.  If this exercise is too easy, try doing it with your arms crossed over your chest. Abdominal Crunches  Repeat these steps 5-10 times: 1. Lie on your back on a firm bed or the floor with your legs extended. 2. Bend your knees so they are pointing toward the ceiling and your feet are flat on the floor. 3. Cross your arms over your chest. 4. Tip your  chin slightly toward your chest without bending your neck. 5. Tighten your abdominal muscles and slowly raise your trunk (torso) high enough to lift your shoulder blades a tiny bit off of the floor. Avoid raising your torso higher than that, because it can put too much stress on your low back and it does not help to strengthen your abdominal muscles. 6. Slowly return to your starting position.  Back Lifts Repeat these steps 5-10 times: 1. Lie on your abdomen (face-down) with your arms at your sides, and rest your forehead on the floor. 2. Tighten the muscles in your legs and your buttocks. 3. Slowly lift your chest off of the floor while you keep your hips pressed to the floor. Keep the back of your head in line with the curve in your back. Your eyes should be looking at the floor. 4. Hold this position for 3-5 seconds. 5. Slowly return to your starting position.  Contact a health care provider if:  Your back pain or discomfort gets much worse when you do an exercise.  Your back pain or discomfort does not lessen within 2 hours after you exercise. If you have any of these problems, stop doing these exercises right away. Do not do them again unless your health care provider says that you can. Get help right away if:  You develop sudden, severe back pain. If this happens, stop doing the exercises right away. Do not do them again unless your health care provider says that you can. This information is not intended to replace advice given to you by your health care provider. Make sure you discuss any questions you have with your health care provider. Document Released: 05/09/2004 Document Revised: 08/09/2015 Document Reviewed: 05/26/2014 Elsevier Interactive Patient Education  2017 [employer].

## 2017-09-27 ENCOUNTER — Ambulatory Visit (HOSPITAL_BASED_OUTPATIENT_CLINIC_OR_DEPARTMENT_OTHER)
Admission: RE | Admit: 2017-09-27 | Discharge: 2017-09-27 | Disposition: A | Discharge status: Home / Self Care | Payer: Medicare Other | Source: Ambulatory Visit | Attending: Family | Admitting: Family

## 2017-09-27 DIAGNOSIS — M5416 Radiculopathy, lumbar region: Secondary | ICD-10-CM | POA: Diagnosis not present

## 2017-09-27 DIAGNOSIS — M4316 Spondylolisthesis, lumbar region: Secondary | ICD-10-CM | POA: Insufficient documentation

## 2017-09-27 DIAGNOSIS — M48061 Spinal stenosis, lumbar region without neurogenic claudication: Secondary | ICD-10-CM | POA: Diagnosis not present

## 2017-09-29 ENCOUNTER — Telehealth: Payer: Self-pay | Admitting: Family

## 2017-09-29 DIAGNOSIS — M5136 Other intervertebral disc degeneration, lumbar region: Secondary | ICD-10-CM

## 2017-09-29 DIAGNOSIS — G589 Mononeuropathy, unspecified: Secondary | ICD-10-CM

## 2017-09-29 DIAGNOSIS — M5126 Other intervertebral disc displacement, lumbar region: Secondary | ICD-10-CM

## 2017-09-29 NOTE — Telephone Encounter (Signed)
Notified pt's daughter and she is agreeable to proceed with referral. Advised her to let us know if she has not been contacted her about this appointment week.

## 2017-09-29 NOTE — Telephone Encounter (Signed)
Reviewed MRI results. Notes degenertive changes including bulging discs and pinched  Nerves. I would recommend referral to neurosurgery.

## 2017-10-01 ENCOUNTER — Telehealth: Payer: Self-pay | Admitting: Family

## 2017-10-01 ENCOUNTER — Encounter: Payer: Self-pay | Admitting: Family

## 2017-10-01 MED ORDER — METHYLPREDNISOLONE 4 MG PO TBPK: ORAL_TABLET | ORAL | 0 refills | Status: DC

## 2017-10-01 NOTE — Telephone Encounter (Signed)
Copied from [place] [id number]. Topic: Quick Communication - See Telephone Encounter >> Oct 01, 2017 12:09 PM Ivar Drape wrote: CRM for notification. See Telephone encounter for: 10/01/17. Patient wanted the provider to know that they got an appt for the Neuro Surgeon for 10/21/17.  Patient cannot wait that long because she is in terrible pain.  Can some pain medication be prescribed or can she received a sooner appt. Please advise.

## 2017-10-01 NOTE — Telephone Encounter (Signed)
Note sent to pt's daughter via mychart.

## 2017-10-02 ENCOUNTER — Observation Stay (HOSPITAL_COMMUNITY)
Admission: EM | Admit: 2017-10-02 | Discharge: 2017-10-04 | Disposition: A | Discharge status: Home / Self Care | Payer: Medicare Other | Attending: Internal Medicine | Admitting: Internal Medicine

## 2017-10-02 ENCOUNTER — Encounter (HOSPITAL_COMMUNITY): Payer: Self-pay | Admitting: Emergency Medicine

## 2017-10-02 ENCOUNTER — Emergency Department (HOSPITAL_COMMUNITY): Payer: Medicare Other

## 2017-10-02 ENCOUNTER — Observation Stay (HOSPITAL_COMMUNITY): Payer: Medicare Other

## 2017-10-02 DIAGNOSIS — Z8673 Personal history of transient ischemic attack (TIA), and cerebral infarction without residual deficits: Secondary | ICD-10-CM | POA: Diagnosis not present

## 2017-10-02 DIAGNOSIS — J45909 Unspecified asthma, uncomplicated: Secondary | ICD-10-CM | POA: Insufficient documentation

## 2017-10-02 DIAGNOSIS — Z7982 Long term (current) use of aspirin: Secondary | ICD-10-CM | POA: Diagnosis not present

## 2017-10-02 DIAGNOSIS — Z7951 Long term (current) use of inhaled steroids: Secondary | ICD-10-CM | POA: Diagnosis not present

## 2017-10-02 DIAGNOSIS — R10A2 Flank pain, left side: Secondary | ICD-10-CM

## 2017-10-02 DIAGNOSIS — R160 Hepatomegaly, not elsewhere classified: Secondary | ICD-10-CM

## 2017-10-02 DIAGNOSIS — E785 Hyperlipidemia, unspecified: Secondary | ICD-10-CM | POA: Insufficient documentation

## 2017-10-02 DIAGNOSIS — R52 Pain, unspecified: Secondary | ICD-10-CM

## 2017-10-02 DIAGNOSIS — Z7984 Long term (current) use of oral hypoglycemic drugs: Secondary | ICD-10-CM | POA: Insufficient documentation

## 2017-10-02 DIAGNOSIS — E669 Obesity, unspecified: Secondary | ICD-10-CM | POA: Diagnosis not present

## 2017-10-02 DIAGNOSIS — I130 Hypertensive heart and chronic kidney disease with heart failure and stage 1 through stage 4 chronic kidney disease, or unspecified chronic kidney disease: Secondary | ICD-10-CM | POA: Diagnosis not present

## 2017-10-02 DIAGNOSIS — Z79899 Other long term (current) drug therapy: Secondary | ICD-10-CM | POA: Diagnosis not present

## 2017-10-02 DIAGNOSIS — R42 Dizziness and giddiness: Secondary | ICD-10-CM | POA: Diagnosis not present

## 2017-10-02 DIAGNOSIS — Z888 Allergy status to other drugs, medicaments and biological substances status: Secondary | ICD-10-CM | POA: Insufficient documentation

## 2017-10-02 DIAGNOSIS — Z6837 Body mass index (BMI) 37.0-37.9, adult: Secondary | ICD-10-CM | POA: Insufficient documentation

## 2017-10-02 DIAGNOSIS — Z885 Allergy status to narcotic agent status: Secondary | ICD-10-CM | POA: Insufficient documentation

## 2017-10-02 DIAGNOSIS — E119 Type 2 diabetes mellitus without complications: Secondary | ICD-10-CM | POA: Diagnosis not present

## 2017-10-02 DIAGNOSIS — Z833 Family history of diabetes mellitus: Secondary | ICD-10-CM | POA: Insufficient documentation

## 2017-10-02 DIAGNOSIS — M545 Low back pain: Secondary | ICD-10-CM | POA: Insufficient documentation

## 2017-10-02 DIAGNOSIS — N182 Chronic kidney disease, stage 2 (mild): Secondary | ICD-10-CM | POA: Diagnosis not present

## 2017-10-02 DIAGNOSIS — E1122 Type 2 diabetes mellitus with diabetic chronic kidney disease: Secondary | ICD-10-CM | POA: Diagnosis not present

## 2017-10-02 DIAGNOSIS — E1151 Type 2 diabetes mellitus with diabetic peripheral angiopathy without gangrene: Secondary | ICD-10-CM | POA: Diagnosis not present

## 2017-10-02 DIAGNOSIS — R109 Unspecified abdominal pain: Secondary | ICD-10-CM | POA: Insufficient documentation

## 2017-10-02 DIAGNOSIS — R0781 Pleurodynia: Secondary | ICD-10-CM

## 2017-10-02 DIAGNOSIS — I951 Orthostatic hypotension: Principal | ICD-10-CM

## 2017-10-02 DIAGNOSIS — I509 Heart failure, unspecified: Secondary | ICD-10-CM | POA: Diagnosis not present

## 2017-10-02 DIAGNOSIS — I10 Essential (primary) hypertension: Secondary | ICD-10-CM | POA: Diagnosis present

## 2017-10-02 LAB — COMPREHENSIVE METABOLIC PANEL
ALBUMIN: UNDETERMINED g/dL (ref 3.5–5.0)
ALT: UNDETERMINED U/L (ref 14–54)
ANION GAP: 17 — AB (ref 5–15)
AST: UNDETERMINED U/L (ref 15–41)
Alkaline Phosphatase: UNDETERMINED U/L (ref 38–126)
BUN: 18 mg/dL (ref 6–20)
CO2: 18 mmol/L — AB (ref 22–32)
Calcium: 9.4 mg/dL (ref 8.9–10.3)
Chloride: 100 mmol/L — ABNORMAL LOW (ref 101–111)
Creatinine, Ser: 1.04 mg/dL — ABNORMAL HIGH (ref 0.44–1.00)
GFR calc non Af Amer: 47 mL/min — ABNORMAL LOW (ref >60)
GFR, EST AFRICAN AMERICAN: 54 mL/min — AB (ref >60)
Glucose, Bld: 115 mg/dL — ABNORMAL HIGH (ref 65–99)
Potassium: 4.6 mmol/L (ref 3.5–5.1)
SODIUM: 135 mmol/L (ref 135–145)
Total Bilirubin: UNDETERMINED mg/dL (ref 0.3–1.2)
Total Protein: UNDETERMINED g/dL (ref 6.5–8.1)

## 2017-10-02 LAB — CBC
HCT: 36.7 % (ref 36.0–46.0)
Hemoglobin: 12 g/dL (ref 12.0–15.0)
MCH: 29.1 pg (ref 26.0–34.0)
MCHC: 32.7 g/dL (ref 30.0–36.0)
MCV: 89.1 fL (ref 78.0–100.0)
PLATELETS: 171 10*3/uL (ref 150–400)
RBC: 4.12 MIL/uL (ref 3.87–5.11)
RDW: 14.3 % (ref 11.5–15.5)
WBC: 7.2 10*3/uL (ref 4.0–10.5)

## 2017-10-02 LAB — URINALYSIS, ROUTINE W REFLEX MICROSCOPIC
Bilirubin Urine: NEGATIVE
GLUCOSE, UA: NEGATIVE mg/dL
Hgb urine dipstick: NEGATIVE
KETONES UR: NEGATIVE mg/dL
Leukocytes, UA: NEGATIVE
NITRITE: NEGATIVE
PH: 5 (ref 5.0–8.0)
PROTEIN: NEGATIVE mg/dL
Specific Gravity, Urine: 1.011 (ref 1.005–1.030)

## 2017-10-02 LAB — I-STAT CG4 LACTIC ACID, ED: LACTIC ACID, VENOUS: 2.34 mmol/L — AB (ref 0.5–1.9)

## 2017-10-02 LAB — PROTIME-INR
INR: 1.09
Prothrombin Time: 14 seconds (ref 11.4–15.2)

## 2017-10-02 LAB — HEPATIC FUNCTION PANEL
ALBUMIN: 3.5 g/dL (ref 3.5–5.0)
ALK PHOS: 61 U/L (ref 38–126)
ALT: 9 U/L — ABNORMAL LOW (ref 14–54)
AST: 20 U/L (ref 15–41)
Bilirubin, Direct: 0.2 mg/dL (ref 0.1–0.5)
Indirect Bilirubin: 0.3 mg/dL (ref 0.3–0.9)
TOTAL PROTEIN: 6.8 g/dL (ref 6.5–8.1)
Total Bilirubin: 0.5 mg/dL (ref 0.3–1.2)

## 2017-10-02 LAB — I-STAT VENOUS BLOOD GAS, ED
Acid-Base Excess: 1 mmol/L (ref 0.0–2.0)
Bicarbonate: 28.3 mmol/L — ABNORMAL HIGH (ref 20.0–28.0)
O2 SAT: 34 %
PCO2 VEN: 56.1 mmHg (ref 44.0–60.0)
PO2 VEN: 23 mmHg — AB (ref 32.0–45.0)
TCO2: 30 mmol/L (ref 22–32)
pH, Ven: 7.31 (ref 7.250–7.430)

## 2017-10-02 LAB — TROPONIN I

## 2017-10-02 LAB — LACTIC ACID, PLASMA: LACTIC ACID, VENOUS: 2.4 mmol/L — AB (ref 0.5–1.9)

## 2017-10-02 MED ORDER — ACETAMINOPHEN 325 MG PO TABS
650.0000 mg | ORAL_TABLET | Freq: Once | ORAL | Status: AC
  Administered 2017-10-02: 650 mg via ORAL
  Filled 2017-10-02: qty 2

## 2017-10-02 MED ORDER — DICLOFENAC SODIUM 1 % TD GEL
4.0000 g | Freq: Every day | TRANSDERMAL | Status: DC
  Administered 2017-10-03 – 2017-10-04 (×2): 4 g via TOPICAL
  Filled 2017-10-02: qty 100

## 2017-10-02 MED ORDER — SODIUM CHLORIDE 0.9 % IV BOLUS
500.0000 mL | Freq: Once | INTRAVENOUS | Status: AC
  Administered 2017-10-02: 500 mL via INTRAVENOUS

## 2017-10-02 MED ORDER — ALBUTEROL SULFATE (2.5 MG/3ML) 0.083% IN NEBU: 3.0000 mL | INHALATION_SOLUTION | Freq: Every day | RESPIRATORY_TRACT | Status: DC | PRN

## 2017-10-02 MED ORDER — FLUTICASONE PROPIONATE 50 MCG/ACT NA SUSP
2.0000 | Freq: Every day | NASAL | Status: DC
  Administered 2017-10-03 – 2017-10-04 (×2): 2 via NASAL
  Filled 2017-10-02: qty 16

## 2017-10-02 MED ORDER — ASPIRIN EC 81 MG PO TBEC
81.0000 mg | DELAYED_RELEASE_TABLET | Freq: Every day | ORAL | Status: DC
  Administered 2017-10-03 – 2017-10-04 (×2): 81 mg via ORAL
  Filled 2017-10-02 (×2): qty 1

## 2017-10-02 MED ORDER — ENOXAPARIN SODIUM 40 MG/0.4ML ~~LOC~~ SOLN
40.0000 mg | SUBCUTANEOUS | Status: DC
  Administered 2017-10-03 – 2017-10-04 (×2): 40 mg via SUBCUTANEOUS
  Filled 2017-10-02 (×2): qty 0.4

## 2017-10-02 MED ORDER — TRAMADOL HCL 50 MG PO TABS
50.0000 mg | ORAL_TABLET | Freq: Once | ORAL | Status: AC
Start: 2017-10-02 — End: 2017-10-02
  Administered 2017-10-02: 50 mg via ORAL
  Filled 2017-10-02: qty 1

## 2017-10-02 MED ORDER — IOHEXOL 300 MG/ML  SOLN
100.0000 mL | Freq: Once | INTRAMUSCULAR | Status: AC | PRN
  Administered 2017-10-02: 100 mL via INTRAVENOUS

## 2017-10-02 MED ORDER — NITROGLYCERIN 0.4 MG SL SUBL: 0.4000 mg | SUBLINGUAL_TABLET | SUBLINGUAL | Status: DC | PRN

## 2017-10-02 MED ORDER — ATORVASTATIN CALCIUM 10 MG PO TABS
10.0000 mg | ORAL_TABLET | Freq: Every day | ORAL | Status: DC
  Administered 2017-10-03 – 2017-10-04 (×2): 10 mg via ORAL
  Filled 2017-10-02 (×2): qty 1

## 2017-10-02 MED ORDER — ACETAMINOPHEN 325 MG PO TABS: 650.0000 mg | ORAL_TABLET | Freq: Four times a day (QID) | ORAL | Status: DC | PRN

## 2017-10-02 MED ORDER — PANTOPRAZOLE SODIUM 40 MG PO TBEC
40.0000 mg | DELAYED_RELEASE_TABLET | Freq: Every day | ORAL | Status: DC
  Administered 2017-10-03 – 2017-10-04 (×2): 40 mg via ORAL
  Filled 2017-10-02 (×2): qty 1

## 2017-10-02 MED ORDER — GABAPENTIN 100 MG PO CAPS
100.0000 mg | ORAL_CAPSULE | Freq: Two times a day (BID) | ORAL | Status: DC
  Administered 2017-10-03 – 2017-10-04 (×4): 100 mg via ORAL
  Filled 2017-10-02 (×4): qty 1

## 2017-10-02 MED ORDER — MECLIZINE HCL 25 MG PO TABS
25.0000 mg | ORAL_TABLET | Freq: Three times a day (TID) | ORAL | Status: DC | PRN
Start: 2017-10-02 — End: 2017-10-04

## 2017-10-02 MED ORDER — ONDANSETRON HCL 4 MG PO TABS: 4.0000 mg | ORAL_TABLET | Freq: Four times a day (QID) | ORAL | Status: DC | PRN

## 2017-10-02 MED ORDER — INSULIN ASPART 100 UNIT/ML ~~LOC~~ SOLN
0.0000 [IU] | Freq: Three times a day (TID) | SUBCUTANEOUS | Status: DC
  Administered 2017-10-03: 5 [IU] via SUBCUTANEOUS
  Administered 2017-10-04: 2 [IU] via SUBCUTANEOUS

## 2017-10-02 MED ORDER — MOMETASONE FURO-FORMOTEROL FUM 200-5 MCG/ACT IN AERO
2.0000 | INHALATION_SPRAY | Freq: Two times a day (BID) | RESPIRATORY_TRACT | Status: DC
  Administered 2017-10-03 – 2017-10-04 (×3): 2 via RESPIRATORY_TRACT
  Filled 2017-10-02: qty 8.8

## 2017-10-02 MED ORDER — SODIUM CHLORIDE 0.9 % IV SOLN
INTRAVENOUS | Status: AC
  Administered 2017-10-03 (×2): via INTRAVENOUS

## 2017-10-02 MED ORDER — ACETAMINOPHEN 650 MG RE SUPP: 650.0000 mg | Freq: Four times a day (QID) | RECTAL | Status: DC | PRN

## 2017-10-02 MED ORDER — ONDANSETRON HCL 4 MG/2ML IJ SOLN: 4.0000 mg | Freq: Four times a day (QID) | INTRAMUSCULAR | Status: DC | PRN

## 2017-10-02 NOTE — ED Provider Notes (Signed)
Medical screening examination/treatment/procedure(s) were conducted as a shared visit with non-physician practitioner(s) and myself.  I personally evaluated the patient during the encounter.  None  Results for orders placed or performed during the hospital encounter of 10/02/17  Urinalysis, Routine w reflex microscopic  Result Value Ref Range   Color, Urine STRAW (A) YELLOW   APPearance CLEAR CLEAR   Specific Gravity, Urine 1.011 1.005 - 1.030   pH 5.0 5.0 - 8.0   Glucose, UA NEGATIVE NEGATIVE mg/dL   Hgb urine dipstick NEGATIVE NEGATIVE   Bilirubin Urine NEGATIVE NEGATIVE   Ketones, ur NEGATIVE NEGATIVE mg/dL   Protein, ur NEGATIVE NEGATIVE mg/dL   Nitrite NEGATIVE NEGATIVE   Leukocytes, UA NEGATIVE NEGATIVE  CBC  Result Value Ref Range   WBC 7.2 4.0 - 10.5 K/uL   RBC 4.12 3.87 - 5.11 MIL/uL   Hemoglobin 12.0 12.0 - 15.0 g/dL   HCT 36.7 36.0 - 46.0 %   MCV 89.1 78.0 - 100.0 fL   MCH 29.1 26.0 - 34.0 pg   MCHC 32.7 30.0 - 36.0 g/dL   RDW 14.3 11.5 - 15.5 %   Platelets 171 150 - 400 K/uL  Comprehensive metabolic panel  Result Value Ref Range   Sodium 135 135 - 145 mmol/L   Potassium 4.6 3.5 - 5.1 mmol/L   Chloride 100 (L) 101 - 111 mmol/L   CO2 18 (L) 22 - 32 mmol/L   Glucose, Bld 115 (H) 65 - 99 mg/dL   BUN 18 6 - 20 mg/dL   Creatinine, Ser 1.04 (H) 0.44 - 1.00 mg/dL   Calcium 9.4 8.9 - 10.3 mg/dL   Total Protein QUANTITY NOT SUFFICIENT, UNABLE TO PERFORM TEST 6.5 - 8.1 g/dL   Albumin QUANTITY NOT SUFFICIENT, UNABLE TO PERFORM TEST 3.5 - 5.0 g/dL   AST QUANTITY NOT SUFFICIENT, UNABLE TO PERFORM TEST 15 - 41 U/L   ALT QUANTITY NOT SUFFICIENT, UNABLE TO PERFORM TEST 14 - 54 U/L   Alkaline Phosphatase QUANTITY NOT SUFFICIENT, UNABLE TO PERFORM TEST 38 - 126 U/L   Total Bilirubin QUANTITY NOT SUFFICIENT, UNABLE TO PERFORM TEST 0.3 - 1.2 mg/dL   GFR calc non Af Amer 47 (L) >60 mL/min   GFR calc Af Amer 54 (L) >60 mL/min   Anion gap 17 (H) 5 - 15   Mr Lumbar Spine Wo  Contrast  Result Date: 09/28/2017 CLINICAL DATA:  Chronic low back pain, worsening for 4 months. Bilateral lower extremity weakness. History of spine surgery. EXAM: MRI LUMBAR SPINE WITHOUT CONTRAST TECHNIQUE: Multiplanar, multisequence MR imaging of the lumbar spine was performed. No intravenous contrast was administered. COMPARISON:  MRI of the lumbar spine March 18, 2010 FINDINGS: SEGMENTATION: For the purposes of this report, the last well-formed intervertebral disc is reported as L5-S1. ALIGNMENT: Straightened lumbar lordosis. New minimal grade 1 L2-3 retrolisthesis. Increased grade 1 L3-4 anterolisthesis. VERTEBRAE:Vertebral bodies are intact. Severe L2-3 and L4-5 disc height loss, progressed at L4-5 with moderate to severe chronic discogenic endplate changes, trace acute component L2-3. Moderate progressed L3-4 disc height loss with desiccation mild acute discogenic endplate changes. No suspicious bone marrow signal. CONUS MEDULLARIS AND CAUDA EQUINA: Conus medullaris terminates at T12-L1 and demonstrates normal morphology and signal characteristics. Cauda equina is normal. PARASPINAL AND OTHER SOFT TISSUES: Moderate lower lumbar paraspinal muscle postoperative denervation. DISC LEVELS: L1-2: Small broad-based disc bulge. No canal stenosis or neural foraminal narrowing. L2-3: Retrolisthesis. 3 mm broad-based disc osteophyte complex. Mild facet arthropathy and ligamentum flavum redundancy. Minimal canal  stenosis. Mild bilateral neural foraminal narrowing. L3-4: Anterolisthesis. LEFT hemilaminectomy. 5 mm broad-based disc bulge with broad-based central disc extrusion, difficult to quantify due to slice selection. Annular fissure. Moderate facet arthropathy and ligamentum flavum redundancy with RIGHT facet effusion. Moderate canal stenosis. Moderate bilateral neural foraminal narrowing. L4-5: Status post LEFT hemilaminectomy. Large LEFT central-extraforaminal disc protrusion versus extrusion and endplate  spurring. Mild facet arthropathy. Mild canal stenosis, LEFT lateral recess effacement with posterior displacement of the traversing LEFT L5 nerve. Severe LEFT neural foraminal narrowing. L5-S1: Similar LEFT extraforaminal disc protrusion laterally displacing the exited LEFT L5 nerve. Mild facet arthropathy. No canal stenosis. Severe LEFT neural foraminal narrowing. IMPRESSION: 1. Status post LEFT L3-4 and L4-5 hemilaminectomy. New L2-3 grade 1 retrolisthesis and progressed grade 1 L3-4 anterolisthesis. 2. Large L4-5 disc protrusion versus extrusion resulting in LEFT L5 nerve impingement. 3. Moderate canal stenosis L3-4.  Mild canal stenosis L4-5. 4. Neural foraminal narrowing L2-3 through L5-S1: Severe on the LEFT at L4-5. Electronically Signed   By: Elon Alas M.D.   On: 09/28/2017 02:31   Ct Abdomen Pelvis W Contrast  Result Date: 10/02/2017 CLINICAL DATA:  Chronic back pain, left-sided abdominal pain EXAM: CT ABDOMEN AND PELVIS WITH CONTRAST TECHNIQUE: Multidetector CT imaging of the abdomen and pelvis was performed using the standard protocol following bolus administration of intravenous contrast. CONTRAST:  149mL OMNIPAQUE IOHEXOL 300 MG/ML  SOLN COMPARISON:  MRI 09/27/2017, CT 08/03/2014, CT 03/14/2010 FINDINGS: Lower chest: Lung bases demonstrate no acute consolidation or pleural effusion. Heart size is normal Hepatobiliary: Heterogeneous enhancing mass within the left hepatic lobe measuring 2.3 cm craniocaudad by 4.2 cm oblique AP by 3.3 cm transverse. Status post cholecystectomy. Mild intra and extrahepatic biliary enlargement. Stable hypodensity in the posterior right hepatic lobe. Pancreas: Unremarkable. No pancreatic ductal dilatation or surrounding inflammatory changes. Spleen: Normal in size without focal abnormality. Adrenals/Urinary Tract: Adrenal glands are within normal limits. No hydronephrosis. Stable cortical calcification and hypodensity in the mid to upper right kidney. The bladder  is normal. Stomach/Bowel: Stomach is within normal limits. Appendix not well seen but no right lower quadrant inflammatory process no evidence of bowel wall thickening, distention, or inflammatory changes. Vascular/Lymphatic: Nonaneurysmal aorta. Mild aortic atherosclerosis. No significant adenopathy. Reproductive: Status post hysterectomy. No adnexal masses. Other: Negative for free air or free fluid. Musculoskeletal: Left laminotomy at L3-L4 and L4-L5. Scoliosis and degenerative changes of the spine. IMPRESSION: 1. No CT evidence for acute intra-abdominal or pelvic abnormality. 2. Slight interval increase in size of heterogenous enhancing mass in the left hepatic lobe. When the patient is clinically stable and able to follow directions and hold their breath (preferably as an outpatient) further evaluation with dedicated abdominal MRI is recommended. 3. Scoliosis and postsurgical changes of the lumbar spine. Electronically Signed   By: Donavan Foil M.D.   On: 10/02/2017 18:22   Dg Hip Unilat W Or Wo Pelvis 2-3 Views Left  Result Date: 10/02/2017 CLINICAL DATA:  Chronic back pain.  No injury. EXAM: DG HIP (WITH OR WITHOUT PELVIS) 2-3V LEFT COMPARISON:  MRI 09/27/2017. FINDINGS: Degenerative changes lumbar spine and both hips. No acute bony or joint abnormality identified. No evidence of fracture dislocation. Peripheral vascular calcification. IMPRESSION: 1. Degenerative changes lumbar spine and both hips. No acute bony abnormality. 2.  Peripheral vascular disease. Electronically Signed   By: Marcello Moores  Register   On: 10/02/2017 13:44     Patient seen by me along with physician assistant Patient has a history of back pain.  Patient  pain though is really not lumbar back it is really more left flank area.  Patient also with MRI in the past showing a liver mass never completely worked up.  Electrolytes here are consistent with a metabolic acidosis.  Liver function tests are still pending.  Lactic acid pending.   Discussed with hospitalist when we get further stuff back to a will consider admission.  CT T here today shows evidence of enlarging liver mass.  Not exactly clear what it is from.  They are recommending another MRI.  Patient never had complete follow-through from GI medicine.  Patient's pain does not seem to be related to her back.  Exact cause of the metabolic acidosis is not clear at this time.  Patient is alert and oriented abdomen soft her pain is on the left with the liver masses on the right.  Patient also had significant weight loss over the last year.  This is also concerning.  Recommending admission.  By hospitalist.   Fredia Sorrow, MD 10/02/17 2021

## 2017-10-02 NOTE — ED Notes (Signed)
Patient returned from xray.

## 2017-10-02 NOTE — Telephone Encounter (Signed)
Pts daughter calling and states they did start her mom on the new medication for her back pain yesterday but that her mom called this morning crying and stating she "cannot take the pain anymore" and they will be taking her to the ER. Daughter states that mom seems to being doing a lot worse today with the pain. She wanted to make Melissa aware.

## 2017-10-02 NOTE — ED Notes (Signed)
Patient transported to CT 

## 2017-10-02 NOTE — ED Notes (Signed)
Attempted blood draw x2 unsuccessfully phlebotomy aware

## 2017-10-02 NOTE — ED Notes (Signed)
Patient at xray

## 2017-10-02 NOTE — ED Provider Notes (Signed)
[facility] EMERGENCY DEPARTMENT Provider Note   CSN: [id number] Arrival date & time: 10/02/17  1132     History   Chief Complaint Chief Complaint  Patient presents with  . Back Pain    HPI Mairely Foxworth Mount is a 84 y.o. female with a history of HTN, Dm Type II, arthritis, asthma, CHF who presents to the emergency department with a chief complaint of back pain.  The patient endorses intermittent low back pain that began at 3 months ago in the middle of her low back, but reports that over the last week that the pain moved to her left side and has become constant.  She characterizes the pain as sharp.  Pain is worse with movement.  She states that when she bends over "I think I might pass out."  She endorses associated dizziness and lightheadedness.  She reports that the dizziness began yesterday, which is when her daughter states that she began taking prednisone that was prescribed by her PCP.  The patient reports that the lightheadedness has been intermittent over the last few weeks and is worse with standing.  She reports that she has a history of constipation, but had a normal, soft bowel movement yesterday.  She reports associated urinary frequency over the last few days.    The patient's daughter reports that she has a follow-up appointment with her neurosurgeon in July, but is concerned that the patient will not be able to wait until the appointment because her pain is been progressively worsening.  She reports that she received a call this morning from her mom crying and stating " I cannot take the pain anymore."  She denies hematuria, urinary hesitancy, dysuria, nausea, vomiting, diarrhea, fever, chills, dyspnea, headache, visual changes, neck pain or stiffness, or chest pain.  She reports that she has not checked her blood sugar at home since June 3.  She reports that she lives in an independent [facility] facility.   The patient reports that she currently weighs  186 pounds.  She reports that this time last year she was approximately 296 pounds.  The history is provided by the patient. No language interpreter was used.    Past Medical History:  Diagnosis Date  . Arthritis   . Asthma   . CHF (congestive heart failure) ([place])   . Chronic headaches   . Constipation   . Diabetes mellitus without complication ([place])   . Hemorrhoids without complication   . Hypertension     Patient Active Problem List   Diagnosis Date Noted  . Dizziness 10/02/2017  . Liver mass 10/02/2017  . Orthostatic hypotension 10/02/2017  . Left flank pain 10/02/2017  . Arthritis 02/19/2017  . Hypertension   . Diabetes mellitus without complication ([place])   . Chronic headaches   . CHF (congestive heart failure) ([place])   . Asthma   . Carpal tunnel syndrome 09/04/2015  . Cervical disc disorder with radiculopathy of cervical region   . TIA (transient ischemic attack) 03/22/2015  . Obesity, unspecified 08/18/2013  . Goiter 08/18/2013  . Anemia, chronic disease 08/18/2013    Past Surgical History:  Procedure Laterality Date  . ABDOMINAL HYSTERECTOMY    . BACK SURGERY    . CHOLECYSTECTOMY    . KNEE SURGERY       OB History   None      Home Medications    Prior to Admission medications   Medication Sig Start Date End Date Taking? Authorizing Provider  acetaminophen (TYLENOL) 500  MG tablet Take 2 tablets (1,000 mg total) by mouth 2 (two) times daily as needed for moderate pain. 08/15/17  Yes Debbrah Alar, NP  albuterol (PROAIR HFA) 108 (90 Base) MCG/ACT inhaler Inhale 2 puffs into the lungs daily as needed for wheezing or shortness of breath.   Yes [provider]  aspirin EC 81 MG tablet Take 81 mg by mouth daily.   Yes [provider]  atorvastatin (LIPITOR) 10 MG tablet TAKE 1 TABLET BY MOUTH ONCE DAILY 07/16/17  Yes Debbrah Alar, NP  diclofenac (FLECTOR) 1.3 % PTCH Place 1 patch onto the skin 2 (two) times daily. 09/13/17  Yes  Carmin Muskrat, MD  diclofenac sodium (VOLTAREN) 1 % GEL Apply 4 g topically 4 (four) times daily. Patient taking differently: Apply 4 g topically daily at 12 noon.  01/21/17  Yes Forde Dandy, MD  DULERA 200-5 MCG/ACT AERO INHALE ONE PUFF INTO LUNGS EVERY 12 HOURS 08/13/17  Yes Debbrah Alar, NP  fluticasone (FLONASE) 50 MCG/ACT nasal spray INSTILL 2 SPRAYS IN EACH NOSTRIL DAILY 06/25/17  Yes Debbrah Alar, NP  gabapentin (NEURONTIN) 100 MG capsule TAKE 1 CAPSULE BY MOUTH TWICE DAILY Patient taking differently: TAKE 1 (100 MG) CAPSULE BY MOUTH TWICE DAILY 05/02/17  Yes Debbrah Alar, NP  hydrochlorothiazide (HYDRODIURIL) 25 MG tablet TAKE 1/2 TABLET BY MOUTH DAILY. Patient taking differently: TAKE 1/2 (12.5 MG) TABLET BY MOUTH DAILY. 05/13/17  Yes Debbrah Alar, NP  meclizine (ANTIVERT) 25 MG tablet Take 1 tablet (25 mg total) by mouth 3 (three) times daily as needed for dizziness. 07/08/17  Yes Debbrah Alar, NP  metFORMIN (GLUCOPHAGE) 500 MG tablet TAKE 1 TABLET BY MOUTH TWICE DAILY Patient taking differently: TAKE 1 TABLET (500 MG) BY MOUTH TWICE DAILY 03/17/17  Yes Debbrah Alar, NP  methylPREDNISolone (MEDROL DOSEPAK) 4 MG TBPK tablet Take per package instructions 10/01/17  Yes Debbrah Alar, NP  nitroGLYCERIN (NITROSTAT) 0.4 MG SL tablet Place 1 tablet (0.4 mg total) under the tongue every 5 (five) minutes as needed for chest pain. 05/21/17  Yes Debbrah Alar, NP  omeprazole (PRILOSEC) 20 MG capsule TAKE 1 CAPSULE BY MOUTH EVERY MORNING Patient taking differently: TAKE 1 CAPSULE (20 MG) BY MOUTH EVERY MORNING 05/02/17  Yes Debbrah Alar, NP  potassium chloride SA (K-DUR,KLOR-CON) 20 MEQ tablet TAKE 1 TABLET BY MOUTH EVERY DAY WITH FOOD Patient taking differently: TAKE 1 TABLET  (20 MEQ) BY MOUTH EVERY DAY WITH FOOD 05/02/17  Yes Debbrah Alar, NP  Vitamin D, Ergocalciferol, (DRISDOL) 50000 units CAPS capsule TAKE 1 CAPSULE BY MOUTH ONCE A  WEEK Patient taking differently: TAKE 1 CAPSULE (50,000 UNITS) BY MOUTH ONCE A WEEK 04/07/17  Yes Debbrah Alar, NP  Blood Glucose Monitoring Suppl (ONE TOUCH ULTRA 2) w/Device KIT Use to check blood sugar daily dx code 250.00 07/03/16   Debbrah Alar, NP  glucose blood (ACCU-CHEK AVIVA) test strip Use as instructed 07/23/17   Debbrah Alar, NP  [facility] DELICA LANCETS 37J MISC USE AS DIRECTED TO CHECK BLOOD SUGAR EVERY DAY Dx code E11.9 01/15/17   Debbrah Alar, NP  triamcinolone cream (KENALOG) 0.5 % APPLY TOPICALLY TWICE DAILY AS NEEDED FOR RASHES Patient not taking: Reported on 10/02/2017 06/25/17   Debbrah Alar, NP    Family History Family History  Problem Relation Age of Onset  . Diabetes Son   . Hypertension Son   . Cancer Sister        breast  . Diabetes Daughter  plus 2 grandkids  . Stroke Son        75moage dx, has enlarged heart  . Heart disease Son        passed away    Social History Social History   Tobacco Use  . Smoking status: Never Smoker  . Smokeless tobacco: Never Used  Substance Use Topics  . Alcohol use: No  . Drug use: No     Allergies   Buprenorphine hcl and Morphine and related   Review of Systems Review of Systems  Constitutional: Negative for activity change, chills and fever.  HENT: Negative for congestion and sore throat.   Eyes: Negative for visual disturbance.  Respiratory: Negative for cough, shortness of breath and wheezing.   Cardiovascular: Negative for chest pain and leg swelling.  Gastrointestinal: Negative for abdominal pain, constipation, diarrhea, nausea and vomiting.  Genitourinary: Positive for flank pain and frequency. Negative for dysuria and urgency.  Musculoskeletal: Positive for arthralgias, back pain and myalgias. Negative for joint swelling, neck pain and neck stiffness.  Skin: Negative for rash.  Allergic/Immunologic: Negative for immunocompromised state.  Neurological: Positive for  dizziness and light-headedness. Negative for syncope, weakness, numbness and headaches.  Hematological: Does not bruise/bleed easily.  Psychiatric/Behavioral: Negative for confusion. The patient is not nervous/anxious.    Physical Exam Updated Vital Signs BP (!) 157/56 (BP Location: Left Arm)   Pulse 67   Temp (!) 97.5 F (36.4 C) (Oral)   Resp 17   Ht 5' (1.524 m)   Wt 84.7 kg (186 lb 11.7 oz)   SpO2 100%   BMI 36.47 kg/m   Physical Exam  Constitutional: No distress.  HENT:  Head: Normocephalic.  Eyes: Conjunctivae are normal.  Neck: Neck supple.  Cardiovascular: Normal rate, regular rhythm, normal heart sounds and intact distal pulses. Exam reveals no gallop and no friction rub.  No murmur heard. Pulmonary/Chest: Effort normal and breath sounds normal. No stridor. No respiratory distress. She has no wheezes. She has no rales. She exhibits no tenderness.  Abdominal: Soft. Bowel sounds are normal. She exhibits no distension. There is no tenderness.  Abdomen is soft, nontender, nondistended.  No CVA tenderness bilaterally.  No peritoneal signs.  Musculoskeletal: She exhibits tenderness.  Tender to palpation to the left lateral hip.  Left knee and ankle are nontender to palpation.  Right lower extremity is nontender.  DP and PT pulses are 2+ and symmetric.  Sensation is intact throughout the bilateral lower extremities.  5 out of 5 strength against resistance with dorsiflexion plantarflexion.  The patient is able to bear weight on the bilateral lower extremities.  Independently moves all digits of the bilateral lower extremities.  Antalgic gait.  No tenderness to the cervical, thoracic, or lumbar spinous processes or surrounding bilateral paraspinal muscles.  Neurological: She is alert.  Skin: Skin is warm. No rash noted. No erythema.  Excess skin is noted to the bilateral upper and lower extremities and trunk.  Psychiatric: Her behavior is normal.  Nursing note and vitals  reviewed.  ED Treatments / Results  Labs (all labs ordered are listed, but only abnormal results are displayed) Labs Reviewed  URINALYSIS, ROUTINE W REFLEX MICROSCOPIC - Abnormal; Notable for the following components:      Result Value   Color, Urine STRAW (*)    All other components within normal limits  COMPREHENSIVE METABOLIC PANEL - Abnormal; Notable for the following components:   Chloride 100 (*)    CO2 18 (*)    Glucose,  Bld 115 (*)    Creatinine, Ser 1.04 (*)    GFR calc non Af Amer 47 (*)    GFR calc Af Amer 54 (*)    Anion gap 17 (*)    All other components within normal limits  LACTIC ACID, PLASMA - Abnormal; Notable for the following components:   Lactic Acid, Venous 2.4 (*)    All other components within normal limits  HEPATIC FUNCTION PANEL - Abnormal; Notable for the following components:   ALT 9 (*)    All other components within normal limits  I-STAT CG4 LACTIC ACID, ED - Abnormal; Notable for the following components:   Lactic Acid, Venous 2.34 (*)    All other components within normal limits  I-STAT VENOUS BLOOD GAS, ED - Abnormal; Notable for the following components:   pO2, Ven 23.0 (*)    Bicarbonate 28.3 (*)    All other components within normal limits  CBC  PROTIME-INR  TROPONIN I  BASIC METABOLIC PANEL  CBC  HEPATIC FUNCTION PANEL    EKG None  Radiology Dg Thoracic Spine 2 View  Result Date: 10/02/2017 CLINICAL DATA:  Back pain that isn't improving. History of back pain. Patient is a history of a liver mass. EXAM: THORACIC SPINE 2 VIEWS COMPARISON:  Two-view chest 08/06/2016.  CT chest 08/06/2016. FINDINGS: Degenerative changes throughout the thoracic spine with disc space narrowing and endplate hypertrophic changes. Bridging anterior osteophytes. Normal alignment of the thoracic spine. No vertebral compression deformities. No focal bone lesion or bone destruction. No paraspinal soft tissue swelling. Calcification of the aorta. Surgical clips in  the right upper quadrant. IMPRESSION: Degenerative changes in the thoracic spine. Normal alignment. No acute displaced fractures identified. Aortic atherosclerosis. Electronically Signed   By: Lucienne Capers M.D.   On: 10/02/2017 22:42   Ct Abdomen Pelvis W Contrast  Result Date: 10/02/2017 CLINICAL DATA:  Chronic back pain, left-sided abdominal pain EXAM: CT ABDOMEN AND PELVIS WITH CONTRAST TECHNIQUE: Multidetector CT imaging of the abdomen and pelvis was performed using the standard protocol following bolus administration of intravenous contrast. CONTRAST:  149m OMNIPAQUE IOHEXOL 300 MG/ML  SOLN COMPARISON:  MRI 09/27/2017, CT 08/03/2014, CT 03/14/2010 FINDINGS: Lower chest: Lung bases demonstrate no acute consolidation or pleural effusion. Heart size is normal Hepatobiliary: Heterogeneous enhancing mass within the left hepatic lobe measuring 2.3 cm craniocaudad by 4.2 cm oblique AP by 3.3 cm transverse. Status post cholecystectomy. Mild intra and extrahepatic biliary enlargement. Stable hypodensity in the posterior right hepatic lobe. Pancreas: Unremarkable. No pancreatic ductal dilatation or surrounding inflammatory changes. Spleen: Normal in size without focal abnormality. Adrenals/Urinary Tract: Adrenal glands are within normal limits. No hydronephrosis. Stable cortical calcification and hypodensity in the mid to upper right kidney. The bladder is normal. Stomach/Bowel: Stomach is within normal limits. Appendix not well seen but no right lower quadrant inflammatory process no evidence of bowel wall thickening, distention, or inflammatory changes. Vascular/Lymphatic: Nonaneurysmal aorta. Mild aortic atherosclerosis. No significant adenopathy. Reproductive: Status post hysterectomy. No adnexal masses. Other: Negative for free air or free fluid. Musculoskeletal: Left laminotomy at L3-L4 and L4-L5. Scoliosis and degenerative changes of the spine. IMPRESSION: 1. No CT evidence for acute intra-abdominal or  pelvic abnormality. 2. Slight interval increase in size of heterogenous enhancing mass in the left hepatic lobe. When the patient is clinically stable and able to follow directions and hold their breath (preferably as an outpatient) further evaluation with dedicated abdominal MRI is recommended. 3. Scoliosis and postsurgical changes of the lumbar spine.  Electronically Signed   By: Donavan Foil M.D.   On: 10/02/2017 18:22   Dg Hip Unilat W Or Wo Pelvis 2-3 Views Left  Result Date: 10/02/2017 CLINICAL DATA:  Chronic back pain.  No injury. EXAM: DG HIP (WITH OR WITHOUT PELVIS) 2-3V LEFT COMPARISON:  MRI 09/27/2017. FINDINGS: Degenerative changes lumbar spine and both hips. No acute bony or joint abnormality identified. No evidence of fracture dislocation. Peripheral vascular calcification. IMPRESSION: 1. Degenerative changes lumbar spine and both hips. No acute bony abnormality. 2.  Peripheral vascular disease. Electronically Signed   By: Marcello Moores  Register   On: 10/02/2017 13:44    Procedures Procedures (including critical care time)  Medications Ordered in ED Medications  albuterol (PROVENTIL) (2.5 MG/3ML) 0.083% nebulizer solution 3 mL (has no administration in time range)  aspirin EC tablet 81 mg (has no administration in time range)  atorvastatin (LIPITOR) tablet 10 mg (has no administration in time range)  diclofenac sodium (VOLTAREN) 1 % transdermal gel 4 g (has no administration in time range)  mometasone-formoterol (DULERA) 200-5 MCG/ACT inhaler 2 puff (has no administration in time range)  fluticasone (FLONASE) 50 MCG/ACT nasal spray 2 spray (has no administration in time range)  gabapentin (NEURONTIN) capsule 100 mg (has no administration in time range)  meclizine (ANTIVERT) tablet 25 mg (has no administration in time range)  nitroGLYCERIN (NITROSTAT) SL tablet 0.4 mg (has no administration in time range)  pantoprazole (PROTONIX) EC tablet 40 mg (has no administration in time range)   acetaminophen (TYLENOL) tablet 650 mg (has no administration in time range)    Or  acetaminophen (TYLENOL) suppository 650 mg (has no administration in time range)  ondansetron (ZOFRAN) tablet 4 mg (has no administration in time range)    Or  ondansetron (ZOFRAN) injection 4 mg (has no administration in time range)  insulin aspart (novoLOG) injection 0-9 Units (has no administration in time range)  enoxaparin (LOVENOX) injection 40 mg (has no administration in time range)  0.9 %  sodium chloride infusion (has no administration in time range)  acetaminophen (TYLENOL) tablet 650 mg (650 mg Oral Given 10/02/17 1342)  traMADol (ULTRAM) tablet 50 mg (50 mg Oral Given 10/02/17 1342)  sodium chloride 0.9 % bolus 500 mL (0 mLs Intravenous Stopped 10/02/17 1738)  iohexol (OMNIPAQUE) 300 MG/ML solution 100 mL (100 mLs Intravenous Contrast Given 10/02/17 1746)  sodium chloride 0.9 % bolus 500 mL (500 mLs Intravenous Bolus 10/02/17 2105)     Initial Impression / Assessment and Plan / ED Course  I have reviewed the triage vital signs and the nursing notes.  Pertinent labs & imaging results that were available during my care of the patient were reviewed by me and considered in my medical decision making (see chart for details).     84 year old female with a history of HTN, Dm Type II, arthritis, asthma, CHF who presents to the emergency department with a chief complaint of back pain.  On exam, the patient actually has tenderness to the left flank area.  X-ray of the pelvis with left hip ordered which demonstrates degenerative changes of the lumbar spine and both hips and peripheral vascular disease, but is otherwise negative.  Urinalysis is unremarkable.   Patient is also endorsed lightheadedness where she feels as if she might pass out with standing.  She is positive for orthostatic hypotension.  She was treated with a 500 cc IV fluid bolus.  Labs are notable for anion gap of 17, bicarb of 18.  CBG is  115.  Given abnormal electrolytes, i-STAT lactate ordered, which is 2.4.  A second 500 cc of IV fluid bolus was ordered.  Venous blood gas with pH of 7.3.  LFTs are unremarkable.  Review of the patient's medical record indicates that the patient had an MR of the abdomen in 2016 that demonstrated a mass of the liver that was concerning for a neoplasm.  Discussed the study with the patient and her daughter.  The patient's daughter reports that they were followed by Dr. Amedeo Plenty with gastroenterology about this time, but the patient's daughter reports that this is about the time that she got involved with her mother's care and see if some of the physicians were switched.  The patient seems to think that she followed up with Dr. Amedeo Plenty regarding the results of the study, but is unable to recall what he told her in regards to the MR findings.  Patient also reports that she has had a 100 pound unintentional weight loss over the last year.  CT abdomen pelvis with slight interval increase in side a heterogeneous enhancing mass in the left hepatic lobe.  Radiology recommends dedicated abdominal MRI.  No other acute intra-abdominal or pelvic findings on CT.  LFTs are unremarkable.  PT/INR is normal.  The patient was seen and evaluated with Dr. Rogene Houston, attending physician who is in agreement that admission for further work-up of the patient's lactic acidosis and liver mass is warranted.  Spoke with Dr. Hal Hope, hospitalist team, who will accept the admission. Pt medically cleared at this time. Psych hold orders and home med orders placed. TTS consult pending; please see psych team notes for further documentation of care/dispo. Pt stable at time of med clearance.    Final Clinical Impressions(s) / ED Diagnoses   Final diagnoses:  Orthostatic hypotension  Left flank pain  Liver mass    ED Discharge Orders    None       Joanne Gavel, PA-C 10/02/17 2354    Fredia Sorrow, MD 10/03/17 [phone]

## 2017-10-02 NOTE — Telephone Encounter (Signed)
Noted  

## 2017-10-02 NOTE — H&P (Addendum)
History and Physical    Angelica Benson WYO:[id number] DOB: 04/19/1928 DOA: 10/02/2017  PCP: Debbrah Alar, NP  Patient coming from: Home.  Chief Complaint: Left flank pain and dizziness.  HPI: Angelica Benson is a 84 y.o. female with history of asthma, hypertension, diabetes mellitus type 2 presents to the ER with complaints of left flank pain dizziness.  Patient has been having dizziness for last few days mostly on standing up.  Denies any loss of consciousness chest pain shortness of breath nausea vomiting or diarrhea.  Over the last few days patient also has benign left flank pain constant increases on moving.  Denies any dysuria fever or chills.  Had gone to her PCP who had ordered MRI of the lumbar spine which showed foraminal narrowing and canal stenosis and was referred to the neurosurgeon was started on steroid taper pack for which 1 dose was taken yesterday.  ED Course: In the ER abdomen appears nontender no rigidity no rebound tenderness.  CT abdomen pelvis does not show anything acute except for enlarging liver mass.  LFTs were normal.  X-ray of the T-spine shows degenerative changes.  X-ray of the hip shows degenerative changes.  Patient's labs show elevated anion gap with lactic acidosis.  Patient also was orthostatic.  Patient was given fluid bolus and admitted for further management.  Patient's left flank pain improved with tramadol.  Review of Systems: As per HPI, rest all negative.   Past Medical History:  Diagnosis Date  . Arthritis   . Asthma   . CHF (congestive heart failure) ([place])   . Chronic headaches   . Constipation   . Diabetes mellitus without complication ([place])   . Hemorrhoids without complication   . Hypertension     Past Surgical History:  Procedure Laterality Date  . ABDOMINAL HYSTERECTOMY    . BACK SURGERY    . CHOLECYSTECTOMY    . KNEE SURGERY       reports that she has never smoked. She has never used smokeless tobacco. She reports that  she does not drink alcohol or use drugs.  Allergies  Allergen Reactions  . Buprenorphine Hcl Rash  . Morphine And Related Rash    Family History  Problem Relation Age of Onset  . Diabetes Son   . Hypertension Son   . Cancer Sister        breast  . Diabetes Daughter        plus 2 grandkids  . Stroke Son        68moage dx, has enlarged heart  . Heart disease Son        passed away    Prior to Admission medications   Medication Sig Start Date End Date Taking? Authorizing Provider  acetaminophen (TYLENOL) 500 MG tablet Take 2 tablets (1,000 mg total) by mouth 2 (two) times daily as needed for moderate pain. 08/15/17  Yes ODebbrah Alar NP  albuterol (PROAIR HFA) 108 (90 Base) MCG/ACT inhaler Inhale 2 puffs into the lungs daily as needed for wheezing or shortness of breath.   Yes [provider]  aspirin EC 81 MG tablet Take 81 mg by mouth daily.   Yes [provider]  atorvastatin (LIPITOR) 10 MG tablet TAKE 1 TABLET BY MOUTH ONCE DAILY 07/16/17  Yes ODebbrah Alar NP  diclofenac (FLECTOR) 1.3 % PTCH Place 1 patch onto the skin 2 (two) times daily. 09/13/17  Yes LCarmin Muskrat MD  diclofenac sodium (VOLTAREN) 1 % GEL Apply 4 g  topically 4 (four) times daily. Patient taking differently: Apply 4 g topically daily at 12 noon.  01/21/17  Yes Forde Dandy, MD  DULERA 200-5 MCG/ACT AERO INHALE ONE PUFF INTO LUNGS EVERY 12 HOURS 08/13/17  Yes Debbrah Alar, NP  fluticasone (FLONASE) 50 MCG/ACT nasal spray INSTILL 2 SPRAYS IN EACH NOSTRIL DAILY 06/25/17  Yes Debbrah Alar, NP  gabapentin (NEURONTIN) 100 MG capsule TAKE 1 CAPSULE BY MOUTH TWICE DAILY Patient taking differently: TAKE 1 (100 MG) CAPSULE BY MOUTH TWICE DAILY 05/02/17  Yes Debbrah Alar, NP  hydrochlorothiazide (HYDRODIURIL) 25 MG tablet TAKE 1/2 TABLET BY MOUTH DAILY. Patient taking differently: TAKE 1/2 (12.5 MG) TABLET BY MOUTH DAILY. 05/13/17  Yes Debbrah Alar, NP  meclizine  (ANTIVERT) 25 MG tablet Take 1 tablet (25 mg total) by mouth 3 (three) times daily as needed for dizziness. 07/08/17  Yes Debbrah Alar, NP  metFORMIN (GLUCOPHAGE) 500 MG tablet TAKE 1 TABLET BY MOUTH TWICE DAILY Patient taking differently: TAKE 1 TABLET (500 MG) BY MOUTH TWICE DAILY 03/17/17  Yes Debbrah Alar, NP  methylPREDNISolone (MEDROL DOSEPAK) 4 MG TBPK tablet Take per package instructions 10/01/17  Yes Debbrah Alar, NP  nitroGLYCERIN (NITROSTAT) 0.4 MG SL tablet Place 1 tablet (0.4 mg total) under the tongue every 5 (five) minutes as needed for chest pain. 05/21/17  Yes Debbrah Alar, NP  omeprazole (PRILOSEC) 20 MG capsule TAKE 1 CAPSULE BY MOUTH EVERY MORNING Patient taking differently: TAKE 1 CAPSULE (20 MG) BY MOUTH EVERY MORNING 05/02/17  Yes Debbrah Alar, NP  potassium chloride SA (K-DUR,KLOR-CON) 20 MEQ tablet TAKE 1 TABLET BY MOUTH EVERY DAY WITH FOOD Patient taking differently: TAKE 1 TABLET  (20 MEQ) BY MOUTH EVERY DAY WITH FOOD 05/02/17  Yes Debbrah Alar, NP  Vitamin D, Ergocalciferol, (DRISDOL) 50000 units CAPS capsule TAKE 1 CAPSULE BY MOUTH ONCE A WEEK Patient taking differently: TAKE 1 CAPSULE (50,000 UNITS) BY MOUTH ONCE A WEEK 04/07/17  Yes Debbrah Alar, NP  Blood Glucose Monitoring Suppl (ONE TOUCH ULTRA 2) w/Device KIT Use to check blood sugar daily dx code 250.00 07/03/16   Debbrah Alar, NP  glucose blood (ACCU-CHEK AVIVA) test strip Use as instructed 07/23/17   Debbrah Alar, NP  [facility] DELICA LANCETS 83T MISC USE AS DIRECTED TO CHECK BLOOD SUGAR EVERY DAY Dx code E11.9 01/15/17   Debbrah Alar, NP  triamcinolone cream (KENALOG) 0.5 % APPLY TOPICALLY TWICE DAILY AS NEEDED FOR RASHES Patient not taking: Reported on 10/02/2017 06/25/17   Debbrah Alar, NP    Physical Exam: Vitals:   10/02/17 2145 10/02/17 2225 10/02/17 2245 10/02/17 2246  BP:  135/62  (!) 157/56  Pulse: 77 75  67  Resp: 13 16  17   Temp:     (!) 97.5 F (36.4 C)  TempSrc:    Oral  SpO2: 100% 100%  100%  Weight:   84.7 kg (186 lb 11.7 oz)   Height:   5' (1.524 m)       Constitutional: Moderately built and nourished. Vitals:   10/02/17 2145 10/02/17 2225 10/02/17 2245 10/02/17 2246  BP:  135/62  (!) 157/56  Pulse: 77 75  67  Resp: 13 16  17   Temp:    (!) 97.5 F (36.4 C)  TempSrc:    Oral  SpO2: 100% 100%  100%  Weight:   84.7 kg (186 lb 11.7 oz)   Height:   5' (1.524 m)    Eyes: Anicteric no pallor. ENMT: No discharge from the ears eyes  nose or mouth. Neck: No mass felt.  No neck rigidity.  No JVD appreciated. Respiratory: No rhonchi or crepitations. Cardiovascular: S1-S2 heard no murmurs appreciated. Abdomen: Soft nontender bowel sounds present. Musculoskeletal: No edema.  No joint effusion. Skin: No rash. Neurologic: Alert awake oriented to time place and person.  Moves all extremities. Psychiatric: Appears normal.  Normal affect.   Labs on Admission: I have personally reviewed following labs and imaging studies  CBC: Recent Labs  Lab 10/02/17 1445  WBC 7.2  HGB 12.0  HCT 36.7  MCV 89.1  PLT 818   Basic Metabolic Panel: Recent Labs  Lab 10/02/17 1445  NA 135  K 4.6  CL 100*  CO2 18*  GLUCOSE 115*  BUN 18  CREATININE 1.04*  CALCIUM 9.4   GFR: Estimated Creatinine Clearance: 36.8 mL/min (A) (by C-G formula based on SCr of 1.04 mg/dL (H)). Liver Function Tests: Recent Labs  Lab 10/02/17 1445 10/02/17 1956  AST QUANTITY NOT SUFFICIENT, UNABLE TO PERFORM TEST 20  ALT QUANTITY NOT SUFFICIENT, UNABLE TO PERFORM TEST 9*  ALKPHOS QUANTITY NOT SUFFICIENT, UNABLE TO PERFORM TEST 61  BILITOT QUANTITY NOT SUFFICIENT, UNABLE TO PERFORM TEST 0.5  PROT QUANTITY NOT SUFFICIENT, UNABLE TO PERFORM TEST 6.8  ALBUMIN QUANTITY NOT SUFFICIENT, UNABLE TO PERFORM TEST 3.5   No results for input(s): LIPASE, AMYLASE in the last 168 hours. No results for input(s): AMMONIA in the last 168  hours. Coagulation Profile: Recent Labs  Lab 10/02/17 1956  INR 1.09   Cardiac Enzymes: Recent Labs  Lab 10/02/17 1956  TROPONINI <0.03   BNP (last 3 results) No results for input(s): PROBNP in the last 8760 hours. HbA1C: No results for input(s): HGBA1C in the last 72 hours. CBG: No results for input(s): GLUCAP in the last 168 hours. Lipid Profile: No results for input(s): CHOL, HDL, LDLCALC, TRIG, CHOLHDL, LDLDIRECT in the last 72 hours. Thyroid Function Tests: No results for input(s): TSH, T4TOTAL, FREET4, T3FREE, THYROIDAB in the last 72 hours. Anemia Panel: No results for input(s): VITAMINB12, FOLATE, FERRITIN, TIBC, IRON, RETICCTPCT in the last 72 hours. Urine analysis:    Component Value Date/Time   COLORURINE STRAW (A) 10/02/2017 1314   APPEARANCEUR CLEAR 10/02/2017 1314   LABSPEC 1.011 10/02/2017 1314   PHURINE 5.0 10/02/2017 1314   GLUCOSEU NEGATIVE 10/02/2017 1314   GLUCOSEU NEGATIVE 07/18/2014 0908   HGBUR NEGATIVE 10/02/2017 1314   BILIRUBINUR NEGATIVE 10/02/2017 1314   KETONESUR NEGATIVE 10/02/2017 1314   PROTEINUR NEGATIVE 10/02/2017 1314   UROBILINOGEN 0.2 08/03/2014 1341   NITRITE NEGATIVE 10/02/2017 1314   LEUKOCYTESUR NEGATIVE 10/02/2017 1314   Sepsis Labs: @LABRCNTIP (procalcitonin:4,lacticidven:4) )No results found for this or any previous visit (from the past 240 hour(s)).   Radiological Exams on Admission: Dg Thoracic Spine 2 View  Result Date: 10/02/2017 CLINICAL DATA:  Back pain that isn't improving. History of back pain. Patient is a history of a liver mass. EXAM: THORACIC SPINE 2 VIEWS COMPARISON:  Two-view chest 08/06/2016.  CT chest 08/06/2016. FINDINGS: Degenerative changes throughout the thoracic spine with disc space narrowing and endplate hypertrophic changes. Bridging anterior osteophytes. Normal alignment of the thoracic spine. No vertebral compression deformities. No focal bone lesion or bone destruction. No paraspinal soft tissue  swelling. Calcification of the aorta. Surgical clips in the right upper quadrant. IMPRESSION: Degenerative changes in the thoracic spine. Normal alignment. No acute displaced fractures identified. Aortic atherosclerosis. Electronically Signed   By: Lucienne Capers M.D.   On: 10/02/2017 22:42   Ct  Abdomen Pelvis W Contrast  Result Date: 10/02/2017 CLINICAL DATA:  Chronic back pain, left-sided abdominal pain EXAM: CT ABDOMEN AND PELVIS WITH CONTRAST TECHNIQUE: Multidetector CT imaging of the abdomen and pelvis was performed using the standard protocol following bolus administration of intravenous contrast. CONTRAST:  165m OMNIPAQUE IOHEXOL 300 MG/ML  SOLN COMPARISON:  MRI 09/27/2017, CT 08/03/2014, CT 03/14/2010 FINDINGS: Lower chest: Lung bases demonstrate no acute consolidation or pleural effusion. Heart size is normal Hepatobiliary: Heterogeneous enhancing mass within the left hepatic lobe measuring 2.3 cm craniocaudad by 4.2 cm oblique AP by 3.3 cm transverse. Status post cholecystectomy. Mild intra and extrahepatic biliary enlargement. Stable hypodensity in the posterior right hepatic lobe. Pancreas: Unremarkable. No pancreatic ductal dilatation or surrounding inflammatory changes. Spleen: Normal in size without focal abnormality. Adrenals/Urinary Tract: Adrenal glands are within normal limits. No hydronephrosis. Stable cortical calcification and hypodensity in the mid to upper right kidney. The bladder is normal. Stomach/Bowel: Stomach is within normal limits. Appendix not well seen but no right lower quadrant inflammatory process no evidence of bowel wall thickening, distention, or inflammatory changes. Vascular/Lymphatic: Nonaneurysmal aorta. Mild aortic atherosclerosis. No significant adenopathy. Reproductive: Status post hysterectomy. No adnexal masses. Other: Negative for free air or free fluid. Musculoskeletal: Left laminotomy at L3-L4 and L4-L5. Scoliosis and degenerative changes of the spine.  IMPRESSION: 1. No CT evidence for acute intra-abdominal or pelvic abnormality. 2. Slight interval increase in size of heterogenous enhancing mass in the left hepatic lobe. When the patient is clinically stable and able to follow directions and hold their breath (preferably as an outpatient) further evaluation with dedicated abdominal MRI is recommended. 3. Scoliosis and postsurgical changes of the lumbar spine. Electronically Signed   By: KDonavan FoilM.D.   On: 10/02/2017 18:22   Dg Hip Unilat W Or Wo Pelvis 2-3 Views Left  Result Date: 10/02/2017 CLINICAL DATA:  Chronic back pain.  No injury. EXAM: DG HIP (WITH OR WITHOUT PELVIS) 2-3V LEFT COMPARISON:  MRI 09/27/2017. FINDINGS: Degenerative changes lumbar spine and both hips. No acute bony or joint abnormality identified. No evidence of fracture dislocation. Peripheral vascular calcification. IMPRESSION: 1. Degenerative changes lumbar spine and both hips. No acute bony abnormality. 2.  Peripheral vascular disease. Electronically Signed   By: TMarcello Moores Register   On: 10/02/2017 13:44     Assessment/Plan Principal Problem:   Dizziness Active Problems:   Hypertension   Diabetes mellitus without complication (HCC)   Liver mass   Orthostatic hypotension   Left flank pain    1. Dizziness -patient was orthostatic on admission.  Patient was given fluid bolus and started on infusion.  Recheck orthostatics in a.m.  Get physical therapy consult.  2. Left flank pain -cause not clear.  Improved with tramadol.  Chest x-ray d-dimer are pending.  We will closely observe. 3. Anion gap metabolic acidosis -cause not clear.  Salicylate level is pending.  Continue with hydration.  Recheck metabolic panel lactate after hydration.  Will hold metformin due to lactic acidosis.  Also hold hydrochlorothiazide since patient is receiving hydration. 4. Diabetes mellitus type 2 -holding metformin due to lactic acidosis.  On sliding scale coverage. 5. Hypertension -holding  hydrochlorothiazide due to patient receiving fluids and also have lactic acidosis.  Will keep patient on PRN IV hydralazine. 6. Chronic kidney disease stage II -follow metabolic panel. 7. Hyperlipidemia on statins. 8. Liver mass enlarging size will will need further work-up. 9. MRI of the spine done recently showed worsening of patient's canal stenosis and foraminal  narrowing and has outpatient follow-up with neurosurgeon.   DVT prophylaxis: Lovenox. Code Status: Full code. Family Communication: [name]. Disposition Plan: To be determined. Consults called: Physical therapy. Admission status: Observation.   Rise Patience MD [facility] Pager [phone].  If 7PM-7AM, please contact night-coverage [email or web address] Password [facility]  10/02/2017, 10:59 PM

## 2017-10-02 NOTE — ED Triage Notes (Signed)
Per [facility] patient complaining of chronic back pain, seen a month ago for same. Hx of back surgery 10 years ago. Patient ambulatory with assistance on ems arrival. A/0 x4. No recent injuries or falls.

## 2017-10-02 NOTE — ED Notes (Signed)
Pt back from CT

## 2017-10-03 ENCOUNTER — Observation Stay (HOSPITAL_COMMUNITY): Payer: Medicare Other

## 2017-10-03 DIAGNOSIS — I951 Orthostatic hypotension: Secondary | ICD-10-CM

## 2017-10-03 DIAGNOSIS — R109 Unspecified abdominal pain: Secondary | ICD-10-CM

## 2017-10-03 DIAGNOSIS — I10 Essential (primary) hypertension: Secondary | ICD-10-CM | POA: Diagnosis not present

## 2017-10-03 DIAGNOSIS — R42 Dizziness and giddiness: Secondary | ICD-10-CM | POA: Diagnosis not present

## 2017-10-03 DIAGNOSIS — R160 Hepatomegaly, not elsewhere classified: Secondary | ICD-10-CM | POA: Diagnosis not present

## 2017-10-03 LAB — GLUCOSE, CAPILLARY
GLUCOSE-CAPILLARY: 142 mg/dL — AB (ref 65–99)
Glucose-Capillary: 75 mg/dL (ref 65–99)
Glucose-Capillary: 57 mg/dL — ABNORMAL LOW (ref 65–99)
Glucose-Capillary: 270 mg/dL — ABNORMAL HIGH (ref 65–99)
Glucose-Capillary: 193 mg/dL — ABNORMAL HIGH (ref 65–99)

## 2017-10-03 LAB — CBC
HEMATOCRIT: 33.1 % — AB (ref 36.0–46.0)
HEMOGLOBIN: 10.3 g/dL — AB (ref 12.0–15.0)
MCH: 28.6 pg (ref 26.0–34.0)
MCHC: 31.1 g/dL (ref 30.0–36.0)
MCV: 91.9 fL (ref 78.0–100.0)
PLATELETS: 213 10*3/uL (ref 150–400)
RBC: 3.6 MIL/uL — AB (ref 3.87–5.11)
RDW: 14.6 % (ref 11.5–15.5)
WBC: 7.9 10*3/uL (ref 4.0–10.5)

## 2017-10-03 LAB — BASIC METABOLIC PANEL
ANION GAP: 10 (ref 5–15)
BUN: 14 mg/dL (ref 6–20)
CHLORIDE: 105 mmol/L (ref 101–111)
CO2: 24 mmol/L (ref 22–32)
CREATININE: 0.94 mg/dL (ref 0.44–1.00)
Calcium: 9 mg/dL (ref 8.9–10.3)
GFR calc non Af Amer: 53 mL/min — ABNORMAL LOW (ref >60)
Glucose, Bld: 117 mg/dL — ABNORMAL HIGH (ref 65–99)
POTASSIUM: 3.7 mmol/L (ref 3.5–5.1)
SODIUM: 139 mmol/L (ref 135–145)

## 2017-10-03 LAB — HEPATIC FUNCTION PANEL
ALT: 8 U/L — AB (ref 14–54)
AST: 15 U/L (ref 15–41)
Albumin: 3.3 g/dL — ABNORMAL LOW (ref 3.5–5.0)
Alkaline Phosphatase: 54 U/L (ref 38–126)
Total Bilirubin: 0.6 mg/dL (ref 0.3–1.2)
Total Protein: 6.1 g/dL — ABNORMAL LOW (ref 6.5–8.1)

## 2017-10-03 LAB — TROPONIN I

## 2017-10-03 LAB — D-DIMER, QUANTITATIVE: D-Dimer, Quant: 0.33 ug/mL-FEU (ref 0.00–0.50)

## 2017-10-03 LAB — SALICYLATE LEVEL

## 2017-10-03 LAB — LACTIC ACID, PLASMA
LACTIC ACID, VENOUS: 1.3 mmol/L (ref 0.5–1.9)
Lactic Acid, Venous: 1.6 mmol/L (ref 0.5–1.9)

## 2017-10-03 MED ORDER — TRAMADOL HCL 50 MG PO TABS
50.0000 mg | ORAL_TABLET | Freq: Four times a day (QID) | ORAL | Status: DC | PRN
  Administered 2017-10-03: 50 mg via ORAL
  Filled 2017-10-03: qty 1

## 2017-10-03 MED ORDER — METHYLPREDNISOLONE 4 MG PO TBPK
4.0000 mg | ORAL_TABLET | Freq: Three times a day (TID) | ORAL | Status: AC
  Administered 2017-10-03 (×3): 4 mg via ORAL
  Filled 2017-10-03: qty 21

## 2017-10-03 MED ORDER — METHYLPREDNISOLONE 4 MG PO TBPK
8.0000 mg | ORAL_TABLET | Freq: Every evening | ORAL | Status: AC
  Administered 2017-10-04: 8 mg via ORAL

## 2017-10-03 MED ORDER — METHYLPREDNISOLONE 4 MG PO TBPK
4.0000 mg | ORAL_TABLET | Freq: Four times a day (QID) | ORAL | Status: DC
  Administered 2017-10-04 (×2): 4 mg via ORAL

## 2017-10-03 NOTE — Progress Notes (Signed)
[place], [place] 84 y o f patient admitted from ED via bed. Patient is alert and oriented x 4. Vital signs are stable. Skin assessment done with another nurse found intact. Iv in place and patient is on telemetry. Patient given instruction about call bell and phone. Bed in low position and side rail up x2. Call bell in reach.

## 2017-10-03 NOTE — Evaluation (Addendum)
Physical Therapy Evaluation Patient Details Name: Angelica Benson MRN: [id number] DOB: [birth year] Today's Date: 10/03/2017   History of Present Illness  Pt is a 84 y.o. F with significant PMH of asthma, hypertension, diabetes mellitus type 2 who presents with complaints of left flank pain and dizziness. MRI shows worsening canal stenosis and foraminal narrowing. Patient has outpatient follow up with neurosurgeon.  Clinical Impression  Pt admitted with above diagnosis. Pt currently with functional limitations due to the deficits listed below (see PT Problem List). Patient lives at an independent living facility and has assistance from family members as needed; uses walker for ambulation. Patient presenting at baseline with functional mobility. Ambulating 350 feet with walker and supervision. No dizziness and orthostatics stable. No follow up PT recommended.  Pt will benefit from skilled PT to increase their independence and safety with mobility to allow discharge to the venue listed below.       Follow Up Recommendations No PT follow up, return to ILF    Equipment Recommendations  None recommended by PT    Recommendations for Other Services       Precautions / Restrictions Precautions Precautions: Fall Restrictions Weight Bearing Restrictions: No      Mobility  Bed Mobility Overal bed mobility: Needs Assistance Bed Mobility: Sit to Supine       Sit to supine: Min assist   General bed mobility comments: min assist for BLE management; patient states at home she uses support of bed side table to assist legs into bed  Transfers Overall transfer level: Modified independent Equipment used: None                Ambulation/Gait Ambulation/Gait assistance: Supervision Gait Distance (Feet): 350 Feet Assistive device: Rolling walker (2 wheeled) Gait Pattern/deviations: Trunk flexed;Step-through pattern     General Gait Details: patient with trunk flexion throughout using RW  but no unsteadiness noted with BUE support.   Stairs            Wheelchair Mobility    Modified Rankin (Stroke Patients Only)       Balance Overall balance assessment: Mild deficits observed, not formally tested                                           Pertinent Vitals/Pain Pain Assessment: Faces Faces Pain Scale: Hurts a little bit Pain Location: left flank after walking Pain Descriptors / Indicators: Discomfort Pain Intervention(s): Monitored during session    Home Living Family/patient expects to be discharged to:: Private residence Living Arrangements: Alone Available Help at Discharge: Family;Available PRN/intermittently Type of Home: Independent living facility Home Access: Level entry     Home Layout: One level Home Equipment: Walker - 2 wheels;Walker - 4 wheels;Bedside commode;Shower seat - built in      Prior Function Level of Independence: Independent with assistive device(s)         Comments: Patient uses a Rollator inside her home and a RW outside since it's "easier to transport." Patient is the "resident Freight forwarder," at her ILF; 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     Hand Dominance        Extremity/Trunk Assessment   Upper Extremity Assessment Upper Extremity Assessment: Overall WFL for tasks assessed    Lower Extremity Assessment Lower Extremity Assessment: Generalized weakness    Cervical / Trunk Assessment Cervical / Trunk Assessment: Kyphotic  Communication  Communication: No difficulties  Cognition Arousal/Alertness: Awake/alert Behavior During Therapy: WFL for tasks assessed/performed Overall Cognitive Status: Within Functional Limits for tasks assessed                                        General Comments General comments (skin integrity, edema, etc.): Instructed patient on daily walking maintenance program of 0.4 mi loop in apartment complex    Exercises     Assessment/Plan     PT Assessment Patient needs continued PT services  PT Problem List Decreased strength;Decreased balance;Decreased mobility       PT Treatment Interventions DME instruction;Gait training;Functional mobility training;Therapeutic activities;Therapeutic exercise;Balance training;Patient/family education    PT Goals (Current goals can be found in the Care Plan section)  Acute Rehab PT Goals Patient Stated Goal: take care of herself PT Goal Formulation: With patient Time For Goal Achievement: 10/17/17 Potential to Achieve Goals: Good    Frequency Min 3X/week   Barriers to discharge        Co-evaluation               AM-PAC PT "6 Clicks" Daily Activity  Outcome Measure Difficulty turning over in bed (including adjusting bedclothes, sheets and blankets)?: None Difficulty moving from lying on back to sitting on the side of the bed? : Unable Difficulty sitting down on and standing up from a chair with arms (e.g., wheelchair, bedside commode, etc,.)?: None Help needed moving to and from a bed to chair (including a wheelchair)?: A Little Help needed walking in hospital room?: A Little Help needed climbing 3-5 steps with a railing? : A Lot 6 Click Score: 17    End of Session Equipment Utilized During Treatment: Gait belt Activity Tolerance: Patient tolerated treatment well Patient left: in bed;with bed alarm set   PT Visit Diagnosis: Unsteadiness on feet (R26.81);Difficulty in walking, not elsewhere classified (R26.2)    Time: 1030-1053 PT Time Calculation (min) (ACUTE ONLY): 23 min   Charges:   PT Evaluation $PT Eval Low Complexity: 1 Low PT Treatments $Therapeutic Activity: 8-22 mins   PT G Codes:        Ellamae Sia, PT, DPT Acute Rehabilitation Services  Pager: [place] 10/03/2017, 2:25 PM

## 2017-10-03 NOTE — Progress Notes (Signed)
Hypoglycemic Event  CBG: 57  Treatment: 15 GM carbohydrate snack  Symptoms: None  Follow-up CBG: Time:1226 CBG Result:142  Possible Reasons for Event: Unknown    Angelica Benson Angelica Benson

## 2017-10-03 NOTE — Care Management Note (Signed)
Case Management Note  Patient Details  Name: RILLA BUCKMAN MRN: [id number] Date of Birth: [birth year]  Subjective/Objective:  Dizziness, flank pain                  Action/Plan: NCM spoke to pt's dtr, [name] # [phone]. Pt lives at home alone. She has a caregiver that comes 7 days per week for 3 hours. She has RW, Rollator, wheelchair, bedside commode, and cane)  Expected Discharge Date:                 Expected Discharge Plan:  [place]  In-House Referral:  NA  Discharge planning Services  CM Consult  Post Acute Care Choice:  Home Health Choice offered to:  Adult Children  DME Arranged:  (RW, Rollator, wheelchair, bedside commode, cane) DME Agency:     HH Arranged:    HH Agency:  ([facility] in the past)  Status of Service:  In process, will continue to follow  If discussed at Long Length of Stay Meetings, dates discussed:    Additional Comments:  Erenest Rasher, RN 10/03/2017, 10:33 AM

## 2017-10-03 NOTE — Progress Notes (Signed)
PROGRESS NOTE    Angelica Benson  NOM:[id number] DOB: 1928/06/25 DOA: 10/02/2017 PCP: Debbrah Alar, NP    Brief Narrative:  Angelica Benson is a 84 y.o. female with history of asthma, hypertension, diabetes mellitus type 2 presents to the ER with complaints of left flank pain dizziness.  Patient has been having dizziness for last few days mostly on standing up. She was admitted for orthostatic hypotension and evaluation of left sided flank pain.      Assessment & Plan:   Principal Problem:   Dizziness Active Problems:   Hypertension   Diabetes mellitus without complication (HCC)   Liver mass   Orthostatic hypotension   Left flank pain   LEFT flank pain  Resolved.    Abnormal CT abdomen showing increasing in the size of the heterogenous liver mass: Will get MRI of the abdomen for further evaluation.   Dizziness probably from orthostatic hypotension: Resolved after fluids.  Repeat orthostatic vital sigsn and notify MD of the results.    Hypertension: well controlled. Holding HCTZ.   Diabetes Mellitus: CBG (last 3)  Recent Labs    10/03/17 0754 10/03/17 1154 10/03/17 1226  GLUCAP 75 57* 142*   Get hemoglobin A1c .  Resume SSI.    DVT prophylaxis: lovenox.  Code Status: full code Family Communication: discussed with daughters over the phone and at bedside.  Disposition Plan: pending PT evaluation.   Consultants:   None.   Procedures: MRI abdomen   Antimicrobials:none.   Subjective: Reports her dizziness has resolved. No flank pain.   Objective: Vitals:   10/02/17 2246 10/03/17 0501 10/03/17 0627 10/03/17 0930  BP: (!) 157/56 (!) 142/54    Pulse: 67 66    Resp: 17 17    Temp: (!) 97.5 F (36.4 C) 97.9 F (36.6 C)    TempSrc: Oral Oral    SpO2: 100% 97%  97%  Weight:   87.1 kg (192 lb 0.3 oz)   Height:        Intake/Output Summary (Last 24 hours) at 10/03/2017 1055 Last data filed at 10/03/2017 1024 Gross per 24 hour  Intake 1622  ml  Output -  Net 1622 ml   Filed Weights   10/02/17 1141 10/02/17 2245 10/03/17 0627  Weight: 84.4 kg (186 lb) 84.7 kg (186 lb 11.7 oz) 87.1 kg (192 lb 0.3 oz)    Examination:  General exam: Appears calm and comfortable  Respiratory system: Clear to auscultation. Respiratory effort normal. Cardiovascular system: S1 & S2 heard, RRR. No JVD, murmurs, . No pedal edema. Gastrointestinal system: Abdomen is nondistended, soft and nontender. No organomegaly or masses felt. Normal bowel sounds heard. Central nervous system: Alert and oriented. No focal neurological deficits. Extremities: Symmetric 5 x 5 power. Skin: No rashes, lesions or ulcers Psychiatry:  Mood & affect appropriate.     Data Reviewed: I have personally reviewed following labs and imaging studies  CBC: Recent Labs  Lab 10/02/17 1445 10/03/17 0214  WBC 7.2 7.9  HGB 12.0 10.3*  HCT 36.7 33.1*  MCV 89.1 91.9  PLT 171 962   Basic Metabolic Panel: Recent Labs  Lab 10/02/17 1445 10/03/17 0214  NA 135 139  K 4.6 3.7  CL 100* 105  CO2 18* 24  GLUCOSE 115* 117*  BUN 18 14  CREATININE 1.04* 0.94  CALCIUM 9.4 9.0   GFR: Estimated Creatinine Clearance: 41.3 mL/min (by C-G formula based on SCr of 0.94 mg/dL). Liver Function Tests: Recent Labs  Lab 10/02/17 1445  10/02/17 1956 10/03/17 0214  AST QUANTITY NOT SUFFICIENT, UNABLE TO PERFORM TEST 20 15  ALT QUANTITY NOT SUFFICIENT, UNABLE TO PERFORM TEST 9* 8*  ALKPHOS QUANTITY NOT SUFFICIENT, UNABLE TO PERFORM TEST 61 54  BILITOT QUANTITY NOT SUFFICIENT, UNABLE TO PERFORM TEST 0.5 0.6  PROT QUANTITY NOT SUFFICIENT, UNABLE TO PERFORM TEST 6.8 6.1*  ALBUMIN QUANTITY NOT SUFFICIENT, UNABLE TO PERFORM TEST 3.5 3.3*   No results for input(s): LIPASE, AMYLASE in the last 168 hours. No results for input(s): AMMONIA in the last 168 hours. Coagulation Profile: Recent Labs  Lab 10/02/17 1956  INR 1.09   Cardiac Enzymes: Recent Labs  Lab 10/02/17 1956  10/03/17 0214  TROPONINI <0.03 <0.03   BNP (last 3 results) No results for input(s): PROBNP in the last 8760 hours. HbA1C: No results for input(s): HGBA1C in the last 72 hours. CBG: Recent Labs  Lab 10/03/17 0754  GLUCAP 75   Lipid Profile: No results for input(s): CHOL, HDL, LDLCALC, TRIG, CHOLHDL, LDLDIRECT in the last 72 hours. Thyroid Function Tests: No results for input(s): TSH, T4TOTAL, FREET4, T3FREE, THYROIDAB in the last 72 hours. Anemia Panel: No results for input(s): VITAMINB12, FOLATE, FERRITIN, TIBC, IRON, RETICCTPCT in the last 72 hours. Sepsis Labs: Recent Labs  Lab 10/02/17 1956 10/02/17 2008 10/03/17 0214 10/03/17 0537  LATICACIDVEN 2.4* 2.34* 1.6 1.3    No results found for this or any previous visit (from the past 240 hour(s)).       Radiology Studies: Dg Thoracic Spine 2 View  Result Date: 10/02/2017 CLINICAL DATA:  Back pain that isn't improving. History of back pain. Patient is a history of a liver mass. EXAM: THORACIC SPINE 2 VIEWS COMPARISON:  Two-view chest 08/06/2016.  CT chest 08/06/2016. FINDINGS: Degenerative changes throughout the thoracic spine with disc space narrowing and endplate hypertrophic changes. Bridging anterior osteophytes. Normal alignment of the thoracic spine. No vertebral compression deformities. No focal bone lesion or bone destruction. No paraspinal soft tissue swelling. Calcification of the aorta. Surgical clips in the right upper quadrant. IMPRESSION: Degenerative changes in the thoracic spine. Normal alignment. No acute displaced fractures identified. Aortic atherosclerosis. Electronically Signed   By: Lucienne Capers M.D.   On: 10/02/2017 22:42   Ct Abdomen Pelvis W Contrast  Result Date: 10/02/2017 CLINICAL DATA:  Chronic back pain, left-sided abdominal pain EXAM: CT ABDOMEN AND PELVIS WITH CONTRAST TECHNIQUE: Multidetector CT imaging of the abdomen and pelvis was performed using the standard protocol following bolus  administration of intravenous contrast. CONTRAST:  149mL OMNIPAQUE IOHEXOL 300 MG/ML  SOLN COMPARISON:  MRI 09/27/2017, CT 08/03/2014, CT 03/14/2010 FINDINGS: Lower chest: Lung bases demonstrate no acute consolidation or pleural effusion. Heart size is normal Hepatobiliary: Heterogeneous enhancing mass within the left hepatic lobe measuring 2.3 cm craniocaudad by 4.2 cm oblique AP by 3.3 cm transverse. Status post cholecystectomy. Mild intra and extrahepatic biliary enlargement. Stable hypodensity in the posterior right hepatic lobe. Pancreas: Unremarkable. No pancreatic ductal dilatation or surrounding inflammatory changes. Spleen: Normal in size without focal abnormality. Adrenals/Urinary Tract: Adrenal glands are within normal limits. No hydronephrosis. Stable cortical calcification and hypodensity in the mid to upper right kidney. The bladder is normal. Stomach/Bowel: Stomach is within normal limits. Appendix not well seen but no right lower quadrant inflammatory process no evidence of bowel wall thickening, distention, or inflammatory changes. Vascular/Lymphatic: Nonaneurysmal aorta. Mild aortic atherosclerosis. No significant adenopathy. Reproductive: Status post hysterectomy. No adnexal masses. Other: Negative for free air or free fluid. Musculoskeletal: Left laminotomy at L3-L4  and L4-L5. Scoliosis and degenerative changes of the spine. IMPRESSION: 1. No CT evidence for acute intra-abdominal or pelvic abnormality. 2. Slight interval increase in size of heterogenous enhancing mass in the left hepatic lobe. When the patient is clinically stable and able to follow directions and hold their breath (preferably as an outpatient) further evaluation with dedicated abdominal MRI is recommended. 3. Scoliosis and postsurgical changes of the lumbar spine. Electronically Signed   By: Donavan Foil M.D.   On: 10/02/2017 18:22   Dg Chest Port 1 View  Result Date: 10/03/2017 CLINICAL DATA:  Left-sided rib pain EXAM:  PORTABLE CHEST 1 VIEW COMPARISON:  08/06/2016 FINDINGS: No acute opacity or pleural effusion. Normal cardiomediastinal silhouette. Aortic atherosclerosis. No pneumothorax. IMPRESSION: No active disease. Electronically Signed   By: Donavan Foil M.D.   On: 10/03/2017 01:50   Dg Hip Unilat W Or Wo Pelvis 2-3 Views Left  Result Date: 10/02/2017 CLINICAL DATA:  Chronic back pain.  No injury. EXAM: DG HIP (WITH OR WITHOUT PELVIS) 2-3V LEFT COMPARISON:  MRI 09/27/2017. FINDINGS: Degenerative changes lumbar spine and both hips. No acute bony or joint abnormality identified. No evidence of fracture dislocation. Peripheral vascular calcification. IMPRESSION: 1. Degenerative changes lumbar spine and both hips. No acute bony abnormality. 2.  Peripheral vascular disease. Electronically Signed   By: Marcello Moores  Register   On: 10/02/2017 13:44        Scheduled Meds: . aspirin EC  81 mg Oral Daily  . atorvastatin  10 mg Oral Daily  . diclofenac sodium  4 g Topical Q1200  . enoxaparin (LOVENOX) injection  40 mg Subcutaneous Q24H  . fluticasone  2 spray Each Nare Daily  . gabapentin  100 mg Oral BID  . insulin aspart  0-9 Units Subcutaneous TID WC  . methylPREDNISolone  4 mg Oral 3 x daily with food  . [START ON 10/04/2017] methylPREDNISolone  4 mg Oral 4X daily taper  . methylPREDNISolone  8 mg Oral Nightly  . mometasone-formoterol  2 puff Inhalation BID  . pantoprazole  40 mg Oral Daily   Continuous Infusions: . sodium chloride 75 mL/hr at 10/03/17 0028     LOS: 0 days    Time spent: 30  Minutes.     Hosie Poisson, MD [facility] Pager [phone]   If 7PM-7AM, please contact night-coverage [email or web address] Password [facility] 10/03/2017, 10:55 AM

## 2017-10-04 DIAGNOSIS — E119 Type 2 diabetes mellitus without complications: Secondary | ICD-10-CM

## 2017-10-04 DIAGNOSIS — R42 Dizziness and giddiness: Secondary | ICD-10-CM | POA: Diagnosis not present

## 2017-10-04 DIAGNOSIS — I10 Essential (primary) hypertension: Secondary | ICD-10-CM | POA: Diagnosis not present

## 2017-10-04 DIAGNOSIS — I951 Orthostatic hypotension: Secondary | ICD-10-CM | POA: Diagnosis not present

## 2017-10-04 LAB — GLUCOSE, CAPILLARY
GLUCOSE-CAPILLARY: 119 mg/dL — AB (ref 65–99)
GLUCOSE-CAPILLARY: 161 mg/dL — AB (ref 65–99)

## 2017-10-04 MED ORDER — GADOBENATE DIMEGLUMINE 529 MG/ML IV SOLN
17.0000 mL | Freq: Once | INTRAVENOUS | Status: AC | PRN
  Administered 2017-10-04: 17 mL via INTRAVENOUS

## 2017-10-04 NOTE — Care Management Obs Status (Signed)
[place] NOTIFICATION   Patient Details  Name: MOLLEY HOUSER MRN: [id number] Date of Birth: 09/20/1928   Medicare Observation Status Notification Given:  Yes    Bethena Roys, RN 10/04/2017, 9:35 AM

## 2017-10-04 NOTE — Care Management (Addendum)
[id number] 10-04-17 Pt states she lives at [facility]. Pt states she is pretty independent and has family support. She has a caregiver that comes 7 days per week for 3 hours. Patient states that family assists her to MD appointments. CM will continue to monitor for additional needs. Bethena Roys, RN,BSN Case Manager [phone]

## 2017-10-04 NOTE — Discharge Summary (Signed)
Physician Discharge Summary  Angelica Benson NFA:[id number] DOB: Dec 12, 1928 DOA: 10/02/2017  PCP: Debbrah Alar, NP  Admit date: 10/02/2017 Discharge date: 10/04/2017  Admitted From: ILF Disposition:ILF  Recommendations for Outpatient Follow-up:  1. Follow up with PCP in 1-2 weeks 2. Please obtain BMP/CBC in one week 3. WE HAVE stopped the hydrochlorothiazide  for your dehydration , please resume it after a few days as per your PCP discretion.  4. We have stopped the metformin as your A1c is 5.5   Discharge Condition:stable.  CODE STATUS: full code.  Diet recommendation: Heart Healthy  Brief/Interim Summary: Angelica R Beasleyis a 84 y.o.femalewithhistory of asthma, hypertension, diabetes mellitus type 2 presents to the ER with complaints of left flank pain dizziness. Patient has been having dizziness for last few days mostly on standing up. She was admitted for orthostatic hypotension and evaluation of left sided flank pain.     Discharge Diagnoses:  Principal Problem:   Dizziness Active Problems:   Hypertension   Diabetes mellitus without complication (HCC)   Liver mass   Orthostatic hypotension   Left flank pain  LEFT flank pain  Resolved.    Abnormal CT abdomen showing increasing in the size of the heterogenous liver mass: Will get MRI of the abdomen suggests its probably benign in nature.   Dizziness probably from orthostatic hypotension: Resolved after fluids.     Hypertension: well controlled. Holding HCTZ.  Can be resumed as outpatient after one week, as per PCP.   Diabetes Mellitus: CBG (last 3)  Recent Labs    10/03/17 2151 10/04/17 0815 10/04/17 1241  GLUCAP 193* 119* 161*   A1c is 5.5 stop metformin as she had episodes of hypoglycemia inpatient.  Follow upwith PCP in one week.     Discharge Instructions  Discharge Instructions    Diet - low sodium heart healthy   Complete by:  As directed    Discharge instructions   Complete  by:  As directed    PLEASE follow up with PCP in one week.     Allergies as of 10/04/2017      Reactions   Buprenorphine Hcl Rash   Morphine And Related Rash      Medication List    STOP taking these medications   hydrochlorothiazide 25 MG tablet Commonly known as:  HYDRODIURIL   metFORMIN 500 MG tablet Commonly known as:  GLUCOPHAGE     TAKE these medications   acetaminophen 500 MG tablet Commonly known as:  TYLENOL Take 2 tablets (1,000 mg total) by mouth 2 (two) times daily as needed for moderate pain.   aspirin EC 81 MG tablet Take 81 mg by mouth daily.   atorvastatin 10 MG tablet Commonly known as:  LIPITOR TAKE 1 TABLET BY MOUTH ONCE DAILY   diclofenac 1.3 % Ptch Commonly known as:  FLECTOR Place 1 patch onto the skin 2 (two) times daily.   diclofenac sodium 1 % Gel Commonly known as:  VOLTAREN Apply 4 g topically 4 (four) times daily. What changed:  when to take this   DULERA 200-5 MCG/ACT Aero Generic drug:  mometasone-formoterol INHALE ONE PUFF INTO LUNGS EVERY 12 HOURS   fluticasone 50 MCG/ACT nasal spray Commonly known as:  FLONASE INSTILL 2 SPRAYS IN EACH NOSTRIL DAILY   gabapentin 100 MG capsule Commonly known as:  NEURONTIN TAKE 1 CAPSULE BY MOUTH TWICE DAILY What changed:    how much to take  how to take this  when to take this   glucose  blood test strip Commonly known as:  ACCU-CHEK AVIVA Use as instructed   meclizine 25 MG tablet Commonly known as:  ANTIVERT Take 1 tablet (25 mg total) by mouth 3 (three) times daily as needed for dizziness.   methylPREDNISolone 4 MG Tbpk tablet Commonly known as:  MEDROL DOSEPAK Take per package instructions   nitroGLYCERIN 0.4 MG SL tablet Commonly known as:  NITROSTAT Place 1 tablet (0.4 mg total) under the tongue every 5 (five) minutes as needed for chest pain.   omeprazole 20 MG capsule Commonly known as:  PRILOSEC TAKE 1 CAPSULE BY MOUTH EVERY MORNING What changed:    how much to  take  how to take this  when to take this   [place] 2 w/Device Kit Use to check blood sugar daily dx code 350.09   ONETOUCH DELICA LANCETS 38H Misc USE AS DIRECTED TO CHECK BLOOD SUGAR EVERY DAY Dx code E11.9   potassium chloride SA 20 MEQ tablet Commonly known as:  K-DUR,KLOR-CON TAKE 1 TABLET BY MOUTH EVERY DAY WITH FOOD What changed:    how much to take  how to take this  when to take this  additional instructions   PROAIR HFA 108 (90 Base) MCG/ACT inhaler Generic drug:  albuterol Inhale 2 puffs into the lungs daily as needed for wheezing or shortness of breath.   triamcinolone cream 0.5 % Commonly known as:  KENALOG APPLY TOPICALLY TWICE DAILY AS NEEDED FOR RASHES   Vitamin D (Ergocalciferol) 50000 units Caps capsule Commonly known as:  DRISDOL TAKE 1 CAPSULE BY MOUTH ONCE A WEEK What changed:    how much to take  how to take this  when to take this      Follow-up Information    Debbrah Alar, NP. Schedule an appointment as soon as possible for a visit in 1 week(s).   Specialty:  Internal Medicine Contact information: [address] [place] [address] [phone]          Allergies  Allergen Reactions  . Buprenorphine Hcl Rash  . Morphine And Related Rash    Consultations:  None.    Procedures/Studies: Dg Thoracic Spine 2 View  Result Date: 10/02/2017 CLINICAL DATA:  Back pain that isn't improving. History of back pain. Patient is a history of a liver mass. EXAM: THORACIC SPINE 2 VIEWS COMPARISON:  Two-view chest 08/06/2016.  CT chest 08/06/2016. FINDINGS: Degenerative changes throughout the thoracic spine with disc space narrowing and endplate hypertrophic changes. Bridging anterior osteophytes. Normal alignment of the thoracic spine. No vertebral compression deformities. No focal bone lesion or bone destruction. No paraspinal soft tissue swelling. Calcification of the aorta. Surgical clips in the right  upper quadrant. IMPRESSION: Degenerative changes in the thoracic spine. Normal alignment. No acute displaced fractures identified. Aortic atherosclerosis. Electronically Signed   By: Lucienne Capers M.D.   On: 10/02/2017 22:42   Mr Lumbar Spine Wo Contrast  Result Date: 09/28/2017 CLINICAL DATA:  Chronic low back pain, worsening for 4 months. Bilateral lower extremity weakness. History of spine surgery. EXAM: MRI LUMBAR SPINE WITHOUT CONTRAST TECHNIQUE: Multiplanar, multisequence MR imaging of the lumbar spine was performed. No intravenous contrast was administered. COMPARISON:  MRI of the lumbar spine March 18, 2010 FINDINGS: SEGMENTATION: For the purposes of this report, the last well-formed intervertebral disc is reported as L5-S1. ALIGNMENT: Straightened lumbar lordosis. New minimal grade 1 L2-3 retrolisthesis. Increased grade 1 L3-4 anterolisthesis. VERTEBRAE:Vertebral bodies are intact. Severe L2-3 and L4-5 disc height  loss, progressed at L4-5 with moderate to severe chronic discogenic endplate changes, trace acute component L2-3. Moderate progressed L3-4 disc height loss with desiccation mild acute discogenic endplate changes. No suspicious bone marrow signal. CONUS MEDULLARIS AND CAUDA EQUINA: Conus medullaris terminates at T12-L1 and demonstrates normal morphology and signal characteristics. Cauda equina is normal. PARASPINAL AND OTHER SOFT TISSUES: Moderate lower lumbar paraspinal muscle postoperative denervation. DISC LEVELS: L1-2: Small broad-based disc bulge. No canal stenosis or neural foraminal narrowing. L2-3: Retrolisthesis. 3 mm broad-based disc osteophyte complex. Mild facet arthropathy and ligamentum flavum redundancy. Minimal canal stenosis. Mild bilateral neural foraminal narrowing. L3-4: Anterolisthesis. LEFT hemilaminectomy. 5 mm broad-based disc bulge with broad-based central disc extrusion, difficult to quantify due to slice selection. Annular fissure. Moderate facet arthropathy and  ligamentum flavum redundancy with RIGHT facet effusion. Moderate canal stenosis. Moderate bilateral neural foraminal narrowing. L4-5: Status post LEFT hemilaminectomy. Large LEFT central-extraforaminal disc protrusion versus extrusion and endplate spurring. Mild facet arthropathy. Mild canal stenosis, LEFT lateral recess effacement with posterior displacement of the traversing LEFT L5 nerve. Severe LEFT neural foraminal narrowing. L5-S1: Similar LEFT extraforaminal disc protrusion laterally displacing the exited LEFT L5 nerve. Mild facet arthropathy. No canal stenosis. Severe LEFT neural foraminal narrowing. IMPRESSION: 1. Status post LEFT L3-4 and L4-5 hemilaminectomy. New L2-3 grade 1 retrolisthesis and progressed grade 1 L3-4 anterolisthesis. 2. Large L4-5 disc protrusion versus extrusion resulting in LEFT L5 nerve impingement. 3. Moderate canal stenosis L3-4.  Mild canal stenosis L4-5. 4. Neural foraminal narrowing L2-3 through L5-S1: Severe on the LEFT at L4-5. Electronically Signed   By: Elon Alas M.D.   On: 09/28/2017 02:31   Mr Abdomen W Wo Contrast  Result Date: 10/04/2017 CLINICAL DATA:  84 year old female with history of abnormal CT with indeterminate liver lesion. EXAM: MRI ABDOMEN WITHOUT AND WITH CONTRAST TECHNIQUE: Multiplanar multisequence MR imaging of the abdomen was performed both before and after the administration of intravenous contrast. CONTRAST:  57m MULTIHANCE GADOBENATE DIMEGLUMINE 529 MG/ML IV SOLN COMPARISON:  MRI of the abdomen 01/09/2015. FINDINGS: Lower chest: Unremarkable. Hepatobiliary: The lesion of concern in the left lobe of the liver is centered between segments 2 and 4A (axial image 21 of series 21 and coronal image 57 of series 23) measuring 3.0 x 4.1 x 2.4 cm. This lesion is low signal intensity on T1 weighted images, heterogeneous in signal intensity on T2 weighted images (with predominantly T2 hyperintensity around the periphery of the lesion and central T2  hypointensity), and demonstrates peripheral predominant hyperenhancement on early post gadolinium arterial phase imaging as well as persistent enhancement on delayed post gadolinium images, with some centripetal filling which is incomplete. Some subcentimeter T1 hypointense, slightly T2 hyperintense, nonenhancing lesions are noted in the liver, presumably tiny mildly proteinaceous cysts. No other suspicious hepatic lesions are noted. No intra or extrahepatic biliary ductal dilatation. Status post cholecystectomy. Pancreas: No pancreatic mass. No pancreatic ductal dilatation. No pancreatic or peripancreatic fluid or inflammatory changes. Spleen:  Unremarkable. Adrenals/Urinary Tract: Subcentimeter T1 hypointense, T2 hyperintense, nonenhancing lesions in both kidneys are compatible with simple cysts. In addition, in the upper pole of the right kidney there is an 11 mm lesion which is T1 hypointense with dependent T1 hyperintense material, T2 hyperintense with dependent T2 hypointense material, and does not enhance, compatible with a cyst that is minimally complex with some layering internal proteinaceous/hemorrhagic debris. No hydroureteronephrosis in the visualized portions of the abdomen. Bilateral adrenal glands are normal in appearance. Stomach/Bowel: Normal appearance of the stomach. No pathologic dilatation  of visualized portions of small bowel or colon. Vascular/Lymphatic: No aneurysm identified in the visualized abdominal vasculature. No lymphadenopathy noted in the abdomen. Other:  No significant volume of ascites. Musculoskeletal: No aggressive appearing osseous lesions are noted in the visualized portions of the skeleton. IMPRESSION: 1. Lesion in the liver continues to have unusual indeterminate imaging characteristics. While this has grown slightly compared to the prior examination from 2016, the relatively limited growth does suggest a benign lesion, likely an atypical hemangioma. 2. Additional incidental  findings, as above. Electronically Signed   By: Vinnie Langton M.D.   On: 10/04/2017 11:58   Ct Abdomen Pelvis W Contrast  Result Date: 10/02/2017 CLINICAL DATA:  Chronic back pain, left-sided abdominal pain EXAM: CT ABDOMEN AND PELVIS WITH CONTRAST TECHNIQUE: Multidetector CT imaging of the abdomen and pelvis was performed using the standard protocol following bolus administration of intravenous contrast. CONTRAST:  176m OMNIPAQUE IOHEXOL 300 MG/ML  SOLN COMPARISON:  MRI 09/27/2017, CT 08/03/2014, CT 03/14/2010 FINDINGS: Lower chest: Lung bases demonstrate no acute consolidation or pleural effusion. Heart size is normal Hepatobiliary: Heterogeneous enhancing mass within the left hepatic lobe measuring 2.3 cm craniocaudad by 4.2 cm oblique AP by 3.3 cm transverse. Status post cholecystectomy. Mild intra and extrahepatic biliary enlargement. Stable hypodensity in the posterior right hepatic lobe. Pancreas: Unremarkable. No pancreatic ductal dilatation or surrounding inflammatory changes. Spleen: Normal in size without focal abnormality. Adrenals/Urinary Tract: Adrenal glands are within normal limits. No hydronephrosis. Stable cortical calcification and hypodensity in the mid to upper right kidney. The bladder is normal. Stomach/Bowel: Stomach is within normal limits. Appendix not well seen but no right lower quadrant inflammatory process no evidence of bowel wall thickening, distention, or inflammatory changes. Vascular/Lymphatic: Nonaneurysmal aorta. Mild aortic atherosclerosis. No significant adenopathy. Reproductive: Status post hysterectomy. No adnexal masses. Other: Negative for free air or free fluid. Musculoskeletal: Left laminotomy at L3-L4 and L4-L5. Scoliosis and degenerative changes of the spine. IMPRESSION: 1. No CT evidence for acute intra-abdominal or pelvic abnormality. 2. Slight interval increase in size of heterogenous enhancing mass in the left hepatic lobe. When the patient is clinically  stable and able to follow directions and hold their breath (preferably as an outpatient) further evaluation with dedicated abdominal MRI is recommended. 3. Scoliosis and postsurgical changes of the lumbar spine. Electronically Signed   By: KDonavan FoilM.D.   On: 10/02/2017 18:22   Dg Chest Port 1 View  Result Date: 10/03/2017 CLINICAL DATA:  Left-sided rib pain EXAM: PORTABLE CHEST 1 VIEW COMPARISON:  08/06/2016 FINDINGS: No acute opacity or pleural effusion. Normal cardiomediastinal silhouette. Aortic atherosclerosis. No pneumothorax. IMPRESSION: No active disease. Electronically Signed   By: KDonavan FoilM.D.   On: 10/03/2017 01:50   Dg Hip Unilat W Or Wo Pelvis 2-3 Views Left  Result Date: 10/02/2017 CLINICAL DATA:  Chronic back pain.  No injury. EXAM: DG HIP (WITH OR WITHOUT PELVIS) 2-3V LEFT COMPARISON:  MRI 09/27/2017. FINDINGS: Degenerative changes lumbar spine and both hips. No acute bony or joint abnormality identified. No evidence of fracture dislocation. Peripheral vascular calcification. IMPRESSION: 1. Degenerative changes lumbar spine and both hips. No acute bony abnormality. 2.  Peripheral vascular disease. Electronically Signed   By: TMarcello Moores Register   On: 10/02/2017 13:44       Subjective: No new complaints.   Discharge Exam: Vitals:   10/03/17 2151 10/04/17 0505  BP: 139/81 (!) 150/69  Pulse: 63 (!) 58  Resp: 18 16  Temp: 97.8 F (36.6 C) (!)  97.4 F (36.3 C)  SpO2: 100% 99%   Vitals:   10/03/17 1438 10/03/17 2033 10/03/17 2151 10/04/17 0505  BP: (!) 147/72  139/81 (!) 150/69  Pulse: 84  63 (!) 58  Resp: 14  18 16   Temp:   97.8 F (36.6 C) (!) 97.4 F (36.3 C)  TempSrc:   Oral Oral  SpO2: 100% 97% 100% 99%  Weight:      Height:        General: Pt is alert, awake, not in acute distress Cardiovascular: RRR, S1/S2 +, no rubs, no gallops Respiratory: CTA bilaterally, no wheezing, no rhonchi Abdominal: Soft, NT, ND, bowel sounds + Extremities: no edema, no  cyanosis    The results of significant diagnostics from this hospitalization (including imaging, microbiology, ancillary and laboratory) are listed below for reference.     Microbiology: No results found for this or any previous visit (from the past 240 hour(s)).   Labs: BNP (last 3 results) No results for input(s): BNP in the last 8760 hours. Basic Metabolic Panel: Recent Labs  Lab 10/02/17 1445 10/03/17 0214  NA 135 139  K 4.6 3.7  CL 100* 105  CO2 18* 24  GLUCOSE 115* 117*  BUN 18 14  CREATININE 1.04* 0.94  CALCIUM 9.4 9.0   Liver Function Tests: Recent Labs  Lab 10/02/17 1445 10/02/17 1956 10/03/17 0214  AST QUANTITY NOT SUFFICIENT, UNABLE TO PERFORM TEST 20 15  ALT QUANTITY NOT SUFFICIENT, UNABLE TO PERFORM TEST 9* 8*  ALKPHOS QUANTITY NOT SUFFICIENT, UNABLE TO PERFORM TEST 61 54  BILITOT QUANTITY NOT SUFFICIENT, UNABLE TO PERFORM TEST 0.5 0.6  PROT QUANTITY NOT SUFFICIENT, UNABLE TO PERFORM TEST 6.8 6.1*  ALBUMIN QUANTITY NOT SUFFICIENT, UNABLE TO PERFORM TEST 3.5 3.3*   No results for input(s): LIPASE, AMYLASE in the last 168 hours. No results for input(s): AMMONIA in the last 168 hours. CBC: Recent Labs  Lab 10/02/17 1445 10/03/17 0214  WBC 7.2 7.9  HGB 12.0 10.3*  HCT 36.7 33.1*  MCV 89.1 91.9  PLT 171 213   Cardiac Enzymes: Recent Labs  Lab 10/02/17 1956 10/03/17 0214  TROPONINI <0.03 <0.03   BNP: Invalid input(s): POCBNP CBG: Recent Labs  Lab 10/03/17 1226 10/03/17 1713 10/03/17 2151 10/04/17 0815 10/04/17 1241  GLUCAP 142* 270* 193* 119* 161*   D-Dimer Recent Labs    10/03/17 0214  DDIMER 0.33   Hgb A1c No results for input(s): HGBA1C in the last 72 hours. Lipid Profile No results for input(s): CHOL, HDL, LDLCALC, TRIG, CHOLHDL, LDLDIRECT in the last 72 hours. Thyroid function studies No results for input(s): TSH, T4TOTAL, T3FREE, THYROIDAB in the last 72 hours.  Invalid input(s): FREET3 Anemia work up No results for  input(s): VITAMINB12, FOLATE, FERRITIN, TIBC, IRON, RETICCTPCT in the last 72 hours. Urinalysis    Component Value Date/Time   COLORURINE STRAW (A) 10/02/2017 1314   APPEARANCEUR CLEAR 10/02/2017 1314   LABSPEC 1.011 10/02/2017 1314   PHURINE 5.0 10/02/2017 1314   GLUCOSEU NEGATIVE 10/02/2017 1314   GLUCOSEU NEGATIVE 07/18/2014 0908   HGBUR NEGATIVE 10/02/2017 1314   BILIRUBINUR NEGATIVE 10/02/2017 1314   KETONESUR NEGATIVE 10/02/2017 1314   PROTEINUR NEGATIVE 10/02/2017 1314   UROBILINOGEN 0.2 08/03/2014 1341   NITRITE NEGATIVE 10/02/2017 1314   LEUKOCYTESUR NEGATIVE 10/02/2017 1314   Sepsis Labs Invalid input(s): PROCALCITONIN,  WBC,  LACTICIDVEN Microbiology No results found for this or any previous visit (from the past 240 hour(s)).   Time coordinating discharge: 35 minutes  SIGNED:   Hosie Poisson, MD  [facility] 10/04/2017, 2:22 PM Pager   If 7PM-7AM, please contact night-coverage [email or web address] Password [email or web address]

## 2017-10-06 ENCOUNTER — Telehealth: Payer: Self-pay

## 2017-10-06 NOTE — Telephone Encounter (Signed)
10/06/17   Transition Care Management Follow-up Telephone Call  ADMISSION DATE: 10/02/17 DISCHARGE DATE: 10/04/17  Patient  needs CBC and BMP   How have you been since you were released from the hospital? Per daughter patient is much better. The pain medication has helped.  Do  you understand why you were in the hospital? Yes   Do you understand the discharge instrcutions? Yes    Items Reviewed: Medications reviewed: Yes  Allergies reviewed: Yes   Dietary changes reviewed: Low sodium Heart healthy   Referrals reviewed: Appointment scheduled with M.Osullivan   Functional Questionnaire:   Activities of Daily Living (ADLs): Patient walks with walker and has assistance with all other ADLS  Any patient concerns? Daughter states she needs results  from last MRI   Confirmed importance and date/time of follow-up visits scheduled:Yes    Confirmed with patient if condition begins to worsen call PCP or go to the ER. Yes    Patient was given the office number and encouragred to call back with questions or concerns.Yes

## 2017-10-08 ENCOUNTER — Other Ambulatory Visit: Payer: Self-pay | Admitting: Family

## 2017-10-08 DIAGNOSIS — M792 Neuralgia and neuritis, unspecified: Secondary | ICD-10-CM

## 2017-10-13 ENCOUNTER — Other Ambulatory Visit: Payer: Self-pay | Admitting: Family

## 2017-10-14 ENCOUNTER — Encounter: Payer: Self-pay | Admitting: Family

## 2017-10-14 ENCOUNTER — Ambulatory Visit (INDEPENDENT_AMBULATORY_CARE_PROVIDER_SITE_OTHER): Payer: Medicare Other | Admitting: Family

## 2017-10-14 VITALS — BP 121/59 | HR 80 | Temp 98.7°F | Resp 16 | Ht 61.0 in | Wt 186.0 lb

## 2017-10-14 DIAGNOSIS — E119 Type 2 diabetes mellitus without complications: Secondary | ICD-10-CM | POA: Diagnosis not present

## 2017-10-14 DIAGNOSIS — D649 Anemia, unspecified: Secondary | ICD-10-CM | POA: Diagnosis not present

## 2017-10-14 DIAGNOSIS — M5416 Radiculopathy, lumbar region: Secondary | ICD-10-CM | POA: Diagnosis not present

## 2017-10-14 DIAGNOSIS — I10 Essential (primary) hypertension: Secondary | ICD-10-CM | POA: Diagnosis not present

## 2017-10-14 LAB — BASIC METABOLIC PANEL
BUN: 14 mg/dL (ref 6–23)
CHLORIDE: 103 meq/L (ref 96–112)
CO2: 28 meq/L (ref 19–32)
Calcium: 9.3 mg/dL (ref 8.4–10.5)
Creatinine, Ser: 0.85 mg/dL (ref 0.40–1.20)
GFR: 81.34 mL/min (ref >60.00)
Glucose, Bld: 110 mg/dL — ABNORMAL HIGH (ref 70–99)
Potassium: 4.5 mEq/L (ref 3.5–5.1)
SODIUM: 138 meq/L (ref 135–145)

## 2017-10-14 LAB — CBC WITH DIFFERENTIAL/PLATELET
BASOS ABS: 0 10*3/uL (ref 0.0–0.1)
Basophils Relative: 0.5 % (ref 0.0–3.0)
Eosinophils Absolute: 0.3 10*3/uL (ref 0.0–0.7)
Eosinophils Relative: 4.3 % (ref 0.0–5.0)
HEMATOCRIT: 34.1 % — AB (ref 36.0–46.0)
Hemoglobin: 11.3 g/dL — ABNORMAL LOW (ref 12.0–15.0)
LYMPHS PCT: 38.5 % (ref 12.0–46.0)
Lymphs Abs: 2.5 10*3/uL (ref 0.7–4.0)
MCHC: 33.3 g/dL (ref 30.0–36.0)
MCV: 90.8 fl (ref 78.0–100.0)
MONOS PCT: 8.6 % (ref 3.0–12.0)
Monocytes Absolute: 0.5 10*3/uL (ref 0.1–1.0)
NEUTROS ABS: 3.1 10*3/uL (ref 1.4–7.7)
Neutrophils Relative %: 48.1 % (ref 43.0–77.0)
PLATELETS: 229 10*3/uL (ref 150.0–400.0)
RBC: 3.75 Mil/uL — ABNORMAL LOW (ref 3.87–5.11)
RDW: 15.4 % (ref 11.5–15.5)
WBC: 6.4 10*3/uL (ref 4.0–10.5)

## 2017-10-14 LAB — B12 AND FOLATE PANEL
FOLATE: 17.7 ng/mL (ref >5.9)
Vitamin B-12: 384 pg/mL (ref 211–911)

## 2017-10-14 LAB — IRON: Iron: 57 ug/dL (ref 42–145)

## 2017-10-14 NOTE — Progress Notes (Signed)
Subjective:    Patient ID: Angelica Benson, female    DOB: October 01, 1928, 84 y.o.   MRN: [id number]  HPI  [name] is an 84 yr old female who presents today for hospital follow up. She was admitted 6/20-6/22/19. Discharge summary is reviewed.  She presented initially to the ED on 6/20 with s/o left flank pain and dizziness.  She was noted to have orthostatic hypotension and was admitted. Incidental finding of increasing heterogenous liver mass was noted.  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  Her dizziness/orthostasis resolved after fluids. HCTZ was held.   DM2- had some hypoglycemia as an inpatient and her metformin was stopped.  Lab Results  Component Value Date   HGBA1C 5.5 08/06/2017   HGBA1C 5.8 04/04/2017   HGBA1C 6.5 11/14/2016   Lab Results  Component Value Date   MICROALBUR <0.7 04/04/2017   LDLCALC 30 04/04/2017   CREATININE 0.94 10/03/2017     Low back pain- pt was referred to neurosurgeyr on 09/29/17. MRI lumbar spine 09/28/17 noted:  Status post LEFT L3-4 and L4-5 hemilaminectomy. New L2-3 grade 1 retrolisthesis and progressed grade 1 L3-4 anterolisthesis. 2. Large L4-5 disc protrusion versus extrusion resulting in LEFT L5 nerve impingement. 3. Moderate canal stenosis L3-4.  Mild canal stenosis L4-5. 4. Neural foraminal narrowing L2-3 through L5-S1: Severe on the LEFT at L4-5.       Review of Systems Past Medical History:  Diagnosis Date  . Arthritis   . Asthma   . CHF (congestive heart failure) ([place])   . Chronic headaches   . Constipation   . Diabetes mellitus without complication ([place])   . Hemorrhoids without complication   . Hypertension      Social History   Socioeconomic History  . Marital status: Single    Spouse name: Not on file  . Number of children: Not on file  . Years of education: Not on file  . Highest education level: Not on file  Occupational History  . Not on file  Social Needs  . Financial resource strain:  Not on file  . Food insecurity:    Worry: Not on file    Inability: Not on file  . Transportation needs:    Medical: Not on file    Non-medical: Not on file  Tobacco Use  . Smoking status: Never Smoker  . Smokeless tobacco: Never Used  Substance and Sexual Activity  . Alcohol use: No  . Drug use: No  . Sexual activity: Not on file  Lifestyle  . Physical activity:    Days per week: Not on file    Minutes per session: Not on file  . Stress: Not on file  Relationships  . Social connections:    Talks on phone: Not on file    Gets together: Not on file    Attends religious service: Not on file    Active member of club or organization: Not on file    Attends meetings of clubs or organizations: Not on file    Relationship status: Not on file  . Intimate partner violence:    Fear of current or ex partner: Not on file    Emotionally abused: Not on file    Physically abused: Not on file    Forced sexual activity: Not on file  Other Topics Concern  . Not on file  Social History Narrative   Lives at home by herself, aide in the am.     Has 4  daughters and 3 living sons (one son died).    Uses walker.     Divorced at age 28    Past Surgical History:  Procedure Laterality Date  . ABDOMINAL HYSTERECTOMY    . BACK SURGERY    . CHOLECYSTECTOMY    . KNEE SURGERY      Family History  Problem Relation Age of Onset  . Diabetes Son   . Hypertension Son   . Cancer Sister        breast  . Diabetes Daughter        plus 2 grandkids  . Stroke Son        38moage dx, has enlarged heart  . Heart disease Son        passed away    Allergies  Allergen Reactions  . Buprenorphine Hcl Rash  . Morphine And Related Rash    Current Outpatient Medications on File Prior to Visit  Medication Sig Dispense Refill  . acetaminophen (TYLENOL) 500 MG tablet Take 2 tablets (1,000 mg total) by mouth 2 (two) times daily as needed for moderate pain. 30 tablet 0  . albuterol (PROAIR HFA) 108 (90  Base) MCG/ACT inhaler Inhale 2 puffs into the lungs daily as needed for wheezing or shortness of breath.    .Marland Kitchenaspirin EC 81 MG tablet Take 81 mg by mouth daily.    .Marland Kitchenatorvastatin (LIPITOR) 10 MG tablet TAKE 1 TABLET BY MOUTH ONCE DAILY 90 tablet 0  . Blood Glucose Monitoring Suppl (ONE TOUCH ULTRA 2) w/Device KIT Use to check blood sugar daily dx code 250.00 1 each 0  . diclofenac sodium (VOLTAREN) 1 % GEL Apply 4 g topically 4 (four) times daily. (Patient taking differently: Apply 4 g topically daily at 12 noon. ) 100 g 0  . DULERA 200-5 MCG/ACT AERO INHALE ONE PUFF INTO LUNGS EVERY 12 HOURS 1 Inhaler 5  . fluticasone (FLONASE) 50 MCG/ACT nasal spray INSTILL 2 SPRAYS IN EACH NOSTRIL DAILY 16 g 1  . gabapentin (NEURONTIN) 100 MG capsule TAKE 1 CAPSULE BY MOUTH TWICE DAILY 180 capsule 1  . glucose blood (ACCU-CHEK AVIVA) test strip Use as instructed 100 each 12  . nitroGLYCERIN (NITROSTAT) 0.4 MG SL tablet Place 1 tablet (0.4 mg total) under the tongue every 5 (five) minutes as needed for chest pain. 20 tablet 0  . omeprazole (PRILOSEC) 20 MG capsule TAKE 1 CAPSULE BY MOUTH EVERY MORNING 30 capsule 5  . ONETOUCH DELICA LANCETS 381WMISC USE AS DIRECTED TO CHECK BLOOD SUGAR EVERY DAY Dx code E11.9 100 each 1  . potassium chloride SA (K-DUR,KLOR-CON) 20 MEQ tablet TAKE 1 TABLET BY MOUTH EVERY DAY WITH FOOD 30 tablet 5  . Vitamin D, Ergocalciferol, (DRISDOL) 50000 units CAPS capsule TAKE 1 CAPSULE BY MOUTH ONCE A WEEK (Patient taking differently: TAKE 1 CAPSULE (50,000 UNITS) BY MOUTH ONCE A WEEK) 4 capsule 7   No current facility-administered medications on file prior to visit.     BP (!) 121/59 (BP Location: Right Arm, Patient Position: Sitting, Cuff Size: Small)   Pulse 80   Temp 98.7 F (37.1 C) (Oral)   Resp 16   Ht 5' 1" (1.549 m)   Wt 186 lb (84.4 kg)   SpO2 99%   BMI 35.14 kg/m       Objective:   Physical Exam  Constitutional: She is oriented to person, place, and time. She  appears well-developed and well-nourished.  Cardiovascular: Normal rate, regular rhythm and normal heart  sounds.  No murmur heard. Pulmonary/Chest: Effort normal and breath sounds normal. No respiratory distress. She has no wheezes.  Neurological: She is alert and oriented to person, place, and time.  Skin: Skin is warm and dry.  Psychiatric: She has a normal mood and affect. Her behavior is normal. Judgment and thought content normal.          Assessment & Plan:  HTN- bp stable off of hctz.  Remain off hctz. Advised pt to d/c kdur as well now that she is off of hctz.    DM2- was overtreated. Stable off metformin- remain off metformin.  Lumbar radiculopathy- has upcoming appointment with neurosurgery which I have advised the patient to keep for further recommendations.   Anemia- check b21, folate, cbc.  

## 2017-10-14 NOTE — Patient Instructions (Addendum)
Please complete lab work prior to leaving.  Remain off HCTZ, Kdur (potassium) and Metformin. Keep your upcoming appointment with Dr. Saintclair Halsted for your back.

## 2017-10-15 ENCOUNTER — Telehealth: Payer: Self-pay | Admitting: *Deleted

## 2017-10-15 NOTE — Telephone Encounter (Signed)
Received Home Health Certification and Plan of Care; forwarded to provider/SLS 07/03  

## 2017-10-15 NOTE — Telephone Encounter (Signed)
Angelica Benson -- is albuterol supposed to be a maintenance medication? Pharmacy is requesting refill but I do not see that we have prescribed it before?

## 2017-10-27 ENCOUNTER — Other Ambulatory Visit: Payer: Self-pay | Admitting: Family

## 2017-10-28 ENCOUNTER — Ambulatory Visit (INDEPENDENT_AMBULATORY_CARE_PROVIDER_SITE_OTHER): Payer: Medicare Other | Admitting: Podiatry

## 2017-10-28 ENCOUNTER — Encounter: Payer: Self-pay | Admitting: Podiatry

## 2017-10-28 ENCOUNTER — Ambulatory Visit: Payer: Medicare Other | Admitting: Family

## 2017-10-28 DIAGNOSIS — E119 Type 2 diabetes mellitus without complications: Secondary | ICD-10-CM | POA: Diagnosis not present

## 2017-10-28 DIAGNOSIS — B351 Tinea unguium: Secondary | ICD-10-CM

## 2017-10-28 DIAGNOSIS — M79675 Pain in left toe(s): Secondary | ICD-10-CM

## 2017-10-28 DIAGNOSIS — M79674 Pain in right toe(s): Secondary | ICD-10-CM | POA: Diagnosis not present

## 2017-10-28 MED ORDER — NITROGLYCERIN 0.4 MG SL SUBL: 0.4000 mg | SUBLINGUAL_TABLET | SUBLINGUAL | 1 refills | Status: DC | PRN

## 2017-10-28 NOTE — Progress Notes (Signed)
Patient ID: Angelica Benson, female   DOB: 06/11/1928, 84 y.o.   MRN: [id number] Complaint:  Visit Type: Patient returns to my office for continued preventative foot care services. Complaint: Patient states" my nails have grown long and thick and become painful to walk and wear shoes" Patient has been diagnosed with DM with neuropathy.. The patient presents for preventative foot care services. No changes to ROS  Podiatric Exam: Vascular: dorsalis pedis and posterior tibial pulses are palpable bilateral. Capillary return is immediate. Temperature gradient is WNL. Skin turgor WNL  Sensorium: Diminished  Semmes Weinstein monofilament test. Normal tactile sensation bilaterally. Nail Exam: Pt has thick disfigured discolored nails with subungual debris noted bilateral entire nail hallux through fifth toenails Ulcer Exam: There is no evidence of ulcer or pre-ulcerative changes or infection. Orthopedic Exam: Muscle tone and strength are WNL. No limitations in general ROM. No crepitus or effusions noted. Foot type and digits show no abnormalities. Bony prominences are unremarkable. Skin: No Porokeratosis. No infection or ulcers  Diagnosis:  Onychomycosis, , Pain in right toe, pain in left toes  Treatment & Plan Procedures and Treatment: Consent by patient was obtained for treatment procedures. The patient understood the discussion of treatment and procedures well. All questions were answered thoroughly reviewed. Debridement of mycotic and hypertrophic toenails, 1 through 5 bilateral and clearing of subungual debris. No ulceration, no infection noted.   Return Visit-Office Procedure: Patient instructed to return to the office for a follow up visit 3 months for continued evaluation and treatment.  Ihor Meinzer DPM 

## 2017-10-30 ENCOUNTER — Ambulatory Visit: Payer: Medicare Other | Admitting: Family

## 2017-11-06 ENCOUNTER — Encounter: Payer: Self-pay | Admitting: Family

## 2017-12-09 ENCOUNTER — Other Ambulatory Visit: Payer: Self-pay | Admitting: Family

## 2017-12-11 ENCOUNTER — Encounter: Payer: Self-pay | Admitting: Endocrinology

## 2017-12-11 ENCOUNTER — Ambulatory Visit (INDEPENDENT_AMBULATORY_CARE_PROVIDER_SITE_OTHER): Payer: Medicare Other | Admitting: Endocrinology

## 2017-12-11 VITALS — BP 158/62 | HR 64 | Ht 61.0 in | Wt 190.0 lb

## 2017-12-11 DIAGNOSIS — Z23 Encounter for immunization: Secondary | ICD-10-CM | POA: Diagnosis not present

## 2017-12-11 DIAGNOSIS — E1165 Type 2 diabetes mellitus with hyperglycemia: Secondary | ICD-10-CM | POA: Diagnosis not present

## 2017-12-11 DIAGNOSIS — E119 Type 2 diabetes mellitus without complications: Secondary | ICD-10-CM | POA: Diagnosis not present

## 2017-12-11 DIAGNOSIS — E049 Nontoxic goiter, unspecified: Secondary | ICD-10-CM

## 2017-12-11 LAB — COMPREHENSIVE METABOLIC PANEL
ALT: 15 U/L (ref 0–35)
AST: 17 U/L (ref 0–37)
Albumin: 4 g/dL (ref 3.5–5.2)
Alkaline Phosphatase: 69 U/L (ref 39–117)
BUN: 17 mg/dL (ref 6–23)
CALCIUM: 9.6 mg/dL (ref 8.4–10.5)
CO2: 30 meq/L (ref 19–32)
CREATININE: 0.91 mg/dL (ref 0.40–1.20)
Chloride: 104 mEq/L (ref 96–112)
GFR: 75.16 mL/min (ref >60.00)
Glucose, Bld: 103 mg/dL — ABNORMAL HIGH (ref 70–99)
Potassium: 4.8 mEq/L (ref 3.5–5.1)
Sodium: 139 mEq/L (ref 135–145)
Total Bilirubin: 0.5 mg/dL (ref 0.2–1.2)
Total Protein: 7.3 g/dL (ref 6.0–8.3)

## 2017-12-11 LAB — LIPID PANEL
CHOL/HDL RATIO: 2
Cholesterol: 110 mg/dL (ref 0–200)
HDL: 61.9 mg/dL (ref >39.00)
LDL Cholesterol: 36 mg/dL (ref 0–99)
NonHDL: 48.59
TRIGLYCERIDES: 65 mg/dL (ref 0.0–149.0)
VLDL: 13 mg/dL (ref 0.0–40.0)

## 2017-12-11 LAB — POCT GLYCOSYLATED HEMOGLOBIN (HGB A1C): Hemoglobin A1C: 6.9 % — AB (ref 4.0–5.6)

## 2017-12-11 LAB — GLUCOSE, POCT (MANUAL RESULT ENTRY): POC Glucose: 120 mg/dl — AB (ref 70–99)

## 2017-12-11 LAB — TSH: TSH: 1.94 u[IU]/mL (ref 0.35–4.50)

## 2017-12-11 MED ORDER — METFORMIN HCL 500 MG PO TABS: 500.0000 mg | ORAL_TABLET | Freq: Two times a day (BID) | ORAL | 3 refills | Status: DC

## 2017-12-11 NOTE — Progress Notes (Signed)
Patient ID: Angelica Benson, female   DOB: July 06, 1928, 84 y.o.   MRN: [id number]   Reason for Appointment : Followup of diabetes and other issues  History of Present Illness          Diagnosis: Type 2 diabetes mellitus, date of diagnosis: 1992       Past history: She is unclear about the date of her diagnosis and treatment history. At onset she had symptoms of frequent urination, weakness and sleepiness. She has been on various oral hypoglycemic drugs since her diagnosis. Has been on metformin since at least 2006 and Januvia since 2007. Also previously had taken glyburide and Avandia Her A1c has generally been in the 5.9-6.4 range previously  Recent history:   Oral hypoglycemic drugs the patient previously was taking are: Metformin 500 mg 2x a day, Januvia 150m    She has had fairly consistent control in the past  Although her A1c was 5.5 on her previous visit is is now up to 6.9   Current management:  She was hospitalized in June because of orthostatic hypotension and possible dehydration and her hospital physicians stopped all her diabetes medicines, appears to have had only one blood sugar of 75 that was on the low side and no hypoglycemia  Her daughter is still not checking her blood sugars and blood sugar monitoring has been very sporadic  Although her blood sugars in the lab and today are looking fairly good her A1c is significantly higher than before  Family has not been able to understand how to check her blood sugar at home and has not done any monitoring  She thinks her appetite is fairly good and she is eating better  No side effects with metformin that she had previously taken since 2006  Her weight is about the same     Side effects from medications have been: None  Glucose monitoring:  not done .   Glucometer:  Accu-Chek guide     Blood Glucose readings not available   Hypoglycemia:   none.   Self-care: The diet that the patient has been following  is: tries to limit high-fat foods     Meals: 3 meals per day.  at breakfast has instant oatmeal and toast.  At lunch will have sandwich and fruit; dinner is chicken, starch and vegetables; her snacks are fruit, peanut butter or cheese crackers      Exercise:    will walk some around her living area, uses a walker  Dietician visit: Most recent: 2000            Compliance with the medical regimen: Fairly good  Weight history:  Wt Readings from Last 3 Encounters:  12/11/17 190 lb (86.2 kg)  10/14/17 186 lb (84.4 kg)  10/03/17 192 lb 0.3 oz (87.1 kg)   Glycemic control:   Lab Results  Component Value Date   HGBA1C 6.9 (A) 12/11/2017   HGBA1C 5.5 08/06/2017   HGBA1C 5.8 04/04/2017   Lab Results  Component Value Date   MICROALBUR <0.7 04/04/2017   LDLCALC 36 12/11/2017   CREATININE 0.91 12/11/2017        Allergies as of 12/11/2017      Reactions   Buprenorphine Hcl Rash   Morphine And Related Rash      Medication List        Accurate as of 12/11/17  5:27 PM. Always use your most recent med list.          acetaminophen  500 MG tablet Commonly known as:  TYLENOL Take 2 tablets (1,000 mg total) by mouth 2 (two) times daily as needed for moderate pain.   aspirin EC 81 MG tablet Take 81 mg by mouth daily.   atorvastatin 10 MG tablet Commonly known as:  LIPITOR TAKE 1 TABLET BY MOUTH ONCE DAILY   diclofenac sodium 1 % Gel Commonly known as:  VOLTAREN Apply 4 g topically 4 (four) times daily.   DULERA 200-5 MCG/ACT Aero Generic drug:  mometasone-formoterol INHALE ONE PUFF INTO LUNGS EVERY 12 HOURS   fluticasone 50 MCG/ACT nasal spray Commonly known as:  FLONASE INSTILL 2 SPRAYS IN EACH NOSTRIL DAILY   gabapentin 100 MG capsule Commonly known as:  NEURONTIN TAKE 1 CAPSULE BY MOUTH TWICE DAILY   glucose blood test strip Use as instructed   metFORMIN 500 MG tablet Commonly known as:  GLUCOPHAGE Take 1 tablet (500 mg total) by mouth 2 (two) times daily  with a meal.   nitroGLYCERIN 0.4 MG SL tablet Commonly known as:  NITROSTAT Place 1 tablet (0.4 mg total) under the tongue every 5 (five) minutes as needed for chest pain.   omeprazole 20 MG capsule Commonly known as:  PRILOSEC TAKE 1 CAPSULE BY MOUTH EVERY MORNING   ONE TOUCH ULTRA 2 w/Device Kit Use to check blood sugar daily dx code 124.58   ONETOUCH DELICA LANCETS 09X Misc USE AS DIRECTED TO CHECK BLOOD SUGAR EVERY DAY Dx code E11.9   PROAIR HFA 108 (90 Base) MCG/ACT inhaler Generic drug:  albuterol INHALE 2 PUFFS BY MOUTH FOUR TIMES A DAY AS NEEDED   Vitamin D (Ergocalciferol) 50000 units Caps capsule Commonly known as:  DRISDOL TAKE 1 CAPSULE BY MOUTH ONCE WEEKLY       Allergies:  Allergies  Allergen Reactions  . Buprenorphine Hcl Rash  . Morphine And Related Rash    Past Medical History:  Diagnosis Date  . Arthritis   . Asthma   . CHF (congestive heart failure) ([place])   . Chronic headaches   . Constipation   . Diabetes mellitus without complication ([place])   . Hemorrhoids without complication   . Hypertension     Past Surgical History:  Procedure Laterality Date  . ABDOMINAL HYSTERECTOMY    . BACK SURGERY    . CHOLECYSTECTOMY    . KNEE SURGERY      Family History  Problem Relation Age of Onset  . Diabetes Son   . Hypertension Son   . Cancer Sister        breast  . Diabetes Daughter        plus 2 grandkids  . Stroke Son        28moage dx, has enlarged heart  . Heart disease Son        passed away    Social History:  reports that she has never smoked. She has never used smokeless tobacco. She reports that she does not drink alcohol or use drugs.    Review of Systems         Lipids: Has not been on treatment with a statin drug, levels are normal without statin drugs  Lab Results  Component Value Date   CHOL 110 12/11/2017   HDL 61.90 12/11/2017   LDLCALC 36 12/11/2017   TRIG 65.0 12/11/2017   CHOLHDL 2 12/11/2017       The blood  pressure has been controlled with HCTZ, followed by PCP    History of anemia, she sees her  PCP regularly:  Lab Results  Component Value Date   WBC 6.4 10/14/2017   HGB 11.3 (L) 10/14/2017   HCT 34.1 (L) 10/14/2017   MCV 90.8 10/14/2017   PLT 229.0 10/14/2017     NEUROPATHY: On 100 mg gabapentin At bedtime, taking it  regularly otherwise her legs will ache or tingle     Diabetic foot exam in 1/17    She has a history of a small goiter in the past, euthyroid  Lab Results  Component Value Date   TSH 1.94 12/11/2017    Physical Examination:  BP (!) 158/62   Pulse 64   Ht _0  (1.549 m)   Wt 190 lb (86.2 kg)   SpO2 98%   BMI 35.90 kg/m      ASSESSMENT/ PLAN:   Diabetes type 2 with obesity, relatively mild and well controlled  See history of present illness for  discussion of current diabetes management, blood sugar patterns and problems identified  She appears to have fairly good control now considering her age A1c 6.3  Did not bring her monitor for download and she does not remember her readings She had taken metformin safely before and since her renal function is normal she can take at least 1000 mg a day, discussed that 500 mg once a day is not very effective  Her daughter is going to be involved in her care now including meal planning and medications Discussed checking blood sugars at different times by rotation, no more than one today  There are no Patient Instructions on file for this visit.   Elayne Snare 12/11/2017, 5:27 PM            Patient ID: Angelica Benson, female   DOB: November 11, 1928, 84 y.o.   MRN: [id number]   Reason for Appointment : Followup of diabetes and other issues  History of Present Illness          Diagnosis: Type 2 diabetes mellitus, date of diagnosis: 1992       Past history: She is unclear about the date of her diagnosis and treatment history. At onset she had symptoms of frequent urination, weakness and sleepiness. She has been  on various oral hypoglycemic drugs since her diagnosis. Has been on metformin since at least 2006 and Januvia since 2007. Also previously had taken glyburide and Avandia Her A1c has generally been in the 5.9-6.4 range previously  Recent history:   She is on Januvia and her metformin was supposed to be increase back to twice a day but she is still taking it only once a day  She has had fairly consistent control, A1c 6.3  She was given a new glucose monitor on the last visit and she was probably getting falsely high readings previously Did not bring her monitor for download today She is not having any side effects from metformin but only taking it in the morning Her weight has leveled off  Oral hypoglycemic drugs the patient is taking are: Metformin 500 mg once a day, Januvia 123m      Side effects from medications have been: None  Glucose monitoring:  done once a day or less.   Glucometer: One Touch.       Blood Glucose readings not available today    Hypoglycemia:   none.   Self-care: The diet that the patient has been following is: tries to limit high-fat foods    Meals: 3 meals per day.  at breakfast has oatmeal and toast. At  lunch will have sandwich and fruit; dinner is chicken, starch and vegetables; her snacks are fruit, peanut butter or cheese crackers      Exercise:    will walk around her living area Dietician visit: Most recent: 2000            Compliance with the medical regimen: Good  Weight history:  Wt Readings from Last 3 Encounters:  12/11/17 190 lb (86.2 kg)  10/14/17 186 lb (84.4 kg)  10/03/17 192 lb 0.3 oz (87.1 kg)   Glycemic control:   Lab Results  Component Value Date   HGBA1C 6.9 (A) 12/11/2017   HGBA1C 5.5 08/06/2017   HGBA1C 5.8 04/04/2017   Lab Results  Component Value Date   MICROALBUR <0.7 04/04/2017   LDLCALC 36 12/11/2017   CREATININE 0.91 12/11/2017      Office Visit on 12/11/2017  Component Date Value Ref Range Status  .  Hemoglobin A1C 12/11/2017 6.9* 4.0 - 5.6 % Final  . POC Glucose 12/11/2017 120* 70 - 99 mg/dl Final  . TSH 12/11/2017 1.94  0.35 - 4.50 uIU/mL Final  . Cholesterol 12/11/2017 110  0 - 200 mg/dL Final   ATP III Classification       Desirable:  < 200 mg/dL               Borderline High:  200 - 239 mg/dL          High:  > = 240 mg/dL  . Triglycerides 12/11/2017 65.0  0.0 - 149.0 mg/dL Final   Normal:  <150 mg/dLBorderline High:  150 - 199 mg/dL  . HDL 12/11/2017 61.90  >39.00 mg/dL Final  . VLDL 12/11/2017 13.0  0.0 - 40.0 mg/dL Final  . LDL Cholesterol 12/11/2017 36  0 - 99 mg/dL Final  . Total CHOL/HDL Ratio 12/11/2017 2   Final                  Men          Women1/2 Average Risk     3.4          3.3Average Risk          5.0          4.42X Average Risk          9.6          7.13X Average Risk          15.0          11.0                      . NonHDL 12/11/2017 48.59   Final   NOTE:  Non-HDL goal should be 30 mg/dL higher than patient's LDL goal (i.e. LDL goal of < 70 mg/dL, would have non-HDL goal of < 100 mg/dL)  . Sodium 12/11/2017 139  135 - 145 mEq/L Final  . Potassium 12/11/2017 4.8  3.5 - 5.1 mEq/L Final  . Chloride 12/11/2017 104  96 - 112 mEq/L Final  . CO2 12/11/2017 30  19 - 32 mEq/L Final  . Glucose, Bld 12/11/2017 103* 70 - 99 mg/dL Final  . BUN 12/11/2017 17  6 - 23 mg/dL Final  . Creatinine, Ser 12/11/2017 0.91  0.40 - 1.20 mg/dL Final  . Total Bilirubin 12/11/2017 0.5  0.2 - 1.2 mg/dL Final  . Alkaline Phosphatase 12/11/2017 69  39 - 117 U/L Final  . AST 12/11/2017 17  0 - 37 U/L Final  .  ALT 12/11/2017 15  0 - 35 U/L Final  . Total Protein 12/11/2017 7.3  6.0 - 8.3 g/dL Final  . Albumin 12/11/2017 4.0  3.5 - 5.2 g/dL Final  . Calcium 12/11/2017 9.6  8.4 - 10.5 mg/dL Final  . GFR 12/11/2017 75.16  >60.00 mL/min Final    Allergies as of 12/11/2017      Reactions   Buprenorphine Hcl Rash   Morphine And Related Rash      Medication List        Accurate as of 12/11/17   5:27 PM. Always use your most recent med list.          acetaminophen 500 MG tablet Commonly known as:  TYLENOL Take 2 tablets (1,000 mg total) by mouth 2 (two) times daily as needed for moderate pain.   aspirin EC 81 MG tablet Take 81 mg by mouth daily.   atorvastatin 10 MG tablet Commonly known as:  LIPITOR TAKE 1 TABLET BY MOUTH ONCE DAILY   diclofenac sodium 1 % Gel Commonly known as:  VOLTAREN Apply 4 g topically 4 (four) times daily.   DULERA 200-5 MCG/ACT Aero Generic drug:  mometasone-formoterol INHALE ONE PUFF INTO LUNGS EVERY 12 HOURS   fluticasone 50 MCG/ACT nasal spray Commonly known as:  FLONASE INSTILL 2 SPRAYS IN EACH NOSTRIL DAILY   gabapentin 100 MG capsule Commonly known as:  NEURONTIN TAKE 1 CAPSULE BY MOUTH TWICE DAILY   glucose blood test strip Use as instructed   metFORMIN 500 MG tablet Commonly known as:  GLUCOPHAGE Take 1 tablet (500 mg total) by mouth 2 (two) times daily with a meal.   nitroGLYCERIN 0.4 MG SL tablet Commonly known as:  NITROSTAT Place 1 tablet (0.4 mg total) under the tongue every 5 (five) minutes as needed for chest pain.   omeprazole 20 MG capsule Commonly known as:  PRILOSEC TAKE 1 CAPSULE BY MOUTH EVERY MORNING   ONE TOUCH ULTRA 2 w/Device Kit Use to check blood sugar daily dx code 856.31   ONETOUCH DELICA LANCETS 49F Misc USE AS DIRECTED TO CHECK BLOOD SUGAR EVERY DAY Dx code E11.9   PROAIR HFA 108 (90 Base) MCG/ACT inhaler Generic drug:  albuterol INHALE 2 PUFFS BY MOUTH FOUR TIMES A DAY AS NEEDED   Vitamin D (Ergocalciferol) 50000 units Caps capsule Commonly known as:  DRISDOL TAKE 1 CAPSULE BY MOUTH ONCE WEEKLY       Allergies:  Allergies  Allergen Reactions  . Buprenorphine Hcl Rash  . Morphine And Related Rash    Past Medical History:  Diagnosis Date  . Arthritis   . Asthma   . CHF (congestive heart failure) ([place])   . Chronic headaches   . Constipation   . Diabetes mellitus without  complication ([place])   . Hemorrhoids without complication   . Hypertension     Past Surgical History:  Procedure Laterality Date  . ABDOMINAL HYSTERECTOMY    . BACK SURGERY    . CHOLECYSTECTOMY    . KNEE SURGERY      Family History  Problem Relation Age of Onset  . Diabetes Son   . Hypertension Son   . Cancer Sister        breast  . Diabetes Daughter        plus 2 grandkids  . Stroke Son        73moage dx, has enlarged heart  . Heart disease Son        passed away  Social History:  reports that she has never smoked. She has never used smokeless tobacco. She reports that she does not drink alcohol or use drugs.    Review of Systems         Lipids: She has been started on Lipitor 10 mg in April 2019 by her PCP    Lab Results  Component Value Date   CHOL 110 12/11/2017   HDL 61.90 12/11/2017   LDLCALC 36 12/11/2017   TRIG 65.0 12/11/2017   CHOLHDL 2 12/11/2017    HYPERTENSION: Currently does not appear to be on any medications, previously on HCTZ  Lab Results  Component Value Date   CREATININE 0.91 12/11/2017   BUN 17 12/11/2017   NA 139 12/11/2017   K 4.8 12/11/2017   CL 104 12/11/2017   CO2 30 12/11/2017        NEUROPATHY: On 100 mg gabapentin for treatment of tingling and aching in her legs  Diabetic foot exam in 8/18   She has a history of a small goiter, euthyroid  Lab Results  Component Value Date   TSH 1.94 12/11/2017    Physical Examination:  BP (!) 158/62   Pulse 64   Ht _0  (1.549 m)   Wt 190 lb (86.2 kg)   SpO2 98%   BMI 35.90 kg/m     ASSESSMENT/ PLAN:   Diabetes type 2 with obesity, relatively mild and usually well controlled   See history of present illness for  discussion of current diabetes management, blood sugar patterns and problems identified  Her A1c is 6.9 compared to 5.5 previously  Although her blood sugars do not appear to be high with limited information available and lab glucose readings near normal  her A1c is higher indicating likely postprandial hyperglycemia or possibly high readings overnight  Discussed with her and the family that blood sugars may continue to rise Since she has been doing well with metformin for several years and this may limit any tendency to weight gain would like to get her back on 500 mg twice daily Her daughter will try to check her blood sugar 3 times a week  HYPERTENSION: Currently not on treatment and blood pressure appears increased, she will need to follow-up with PCP  Lipids: Will do follow-up labs today   There are no Patient Instructions on file for this visit.   Elayne Snare 12/11/2017, 5:27 PM   addendum: All labs look good

## 2018-01-13 ENCOUNTER — Ambulatory Visit: Payer: Medicare Other | Admitting: Family

## 2018-01-13 ENCOUNTER — Other Ambulatory Visit: Payer: Self-pay | Admitting: Family

## 2018-01-21 ENCOUNTER — Ambulatory Visit (INDEPENDENT_AMBULATORY_CARE_PROVIDER_SITE_OTHER): Payer: Medicare Other | Admitting: Family

## 2018-01-21 ENCOUNTER — Encounter: Payer: Self-pay | Admitting: Family

## 2018-01-21 VITALS — BP 138/56 | HR 79 | Temp 98.0°F | Ht 61.0 in | Wt 186.0 lb

## 2018-01-21 DIAGNOSIS — I10 Essential (primary) hypertension: Secondary | ICD-10-CM | POA: Diagnosis not present

## 2018-01-21 DIAGNOSIS — E119 Type 2 diabetes mellitus without complications: Secondary | ICD-10-CM | POA: Diagnosis not present

## 2018-01-21 DIAGNOSIS — M5416 Radiculopathy, lumbar region: Secondary | ICD-10-CM | POA: Diagnosis not present

## 2018-01-21 DIAGNOSIS — D649 Anemia, unspecified: Secondary | ICD-10-CM | POA: Diagnosis not present

## 2018-01-21 NOTE — Progress Notes (Signed)
Subjective:    Patient ID: Angelica Benson, female    DOB: Mar 14, 1929, 84 y.o.   MRN: [id number]  HPI  Patient is an 84 year old female who presents today for routine follow-up.  HTN- last visit pressure was noted to be stable off of hydrochlorothiazide we did not restart. She was advised to discontinue her potassium supplement since she was off of the hydrochlorothiazide.  BP Readings from Last 3 Encounters:  01/21/18 (!) 138/56  12/11/17 (!) 158/62  10/14/17 (!) 121/59     DM2-she was restarted on this on 12/11/17.   Lab Results  Component Value Date   HGBA1C 6.9 (A) 12/11/2017   HGBA1C 5.5 08/06/2017   HGBA1C 5.8 04/04/2017   Lab Results  Component Value Date   MICROALBUR <0.7 04/04/2017   LDLCALC 36 12/11/2017   CREATININE 0.91 12/11/2017   Lumbar radiculopathy-she had an epidural steroid injection and reports no current back pain.  Anemia- b12, folate and iron were WNL.   Lab Results  Component Value Date   WBC 6.4 10/14/2017   HGB 11.3 (L) 10/14/2017   HCT 34.1 (L) 10/14/2017   MCV 90.8 10/14/2017   PLT 229.0 10/14/2017   Not sleeping well.  Reports that she will often go to bed at 6 or 7 at night but not fall asleep until midnight.  She naps most days because she is tired during the day.  She reports some intermittent left upper arm pain which seemed to have started after her flu shot the end of August. Review of Systems    see HPI  Past Medical History:  Diagnosis Date  . Arthritis   . Asthma   . CHF (congestive heart failure) ([place])   . Chronic headaches   . Constipation   . Diabetes mellitus without complication ([place])   . Hemorrhoids without complication   . Hypertension      Social History   Socioeconomic History  . Marital status: Single    Spouse name: Not on file  . Number of children: Not on file  . Years of education: Not on file  . Highest education level: Not on file  Occupational History  . Not on file  Social Needs  .  Financial resource strain: Not on file  . Food insecurity:    Worry: Not on file    Inability: Not on file  . Transportation needs:    Medical: Not on file    Non-medical: Not on file  Tobacco Use  . Smoking status: Never Smoker  . Smokeless tobacco: Never Used  Substance and Sexual Activity  . Alcohol use: No  . Drug use: No  . Sexual activity: Not on file  Lifestyle  . Physical activity:    Days per week: Not on file    Minutes per session: Not on file  . Stress: Not on file  Relationships  . Social connections:    Talks on phone: Not on file    Gets together: Not on file    Attends religious service: Not on file    Active member of club or organization: Not on file    Attends meetings of clubs or organizations: Not on file    Relationship status: Not on file  . Intimate partner violence:    Fear of current or ex partner: Not on file    Emotionally abused: Not on file    Physically abused: Not on file    Forced sexual activity: Not on file  Other Topics  Concern  . Not on file  Social History Narrative   Lives at home by herself, aide in the am.     Has 4 daughters and 3 living sons (one son died).    Uses walker.     Divorced at age 35    Past Surgical History:  Procedure Laterality Date  . ABDOMINAL HYSTERECTOMY    . BACK SURGERY    . CHOLECYSTECTOMY    . KNEE SURGERY      Family History  Problem Relation Age of Onset  . Diabetes Son   . Hypertension Son   . Cancer Sister        breast  . Diabetes Daughter        plus 2 grandkids  . Stroke Son        27moage dx, has enlarged heart  . Heart disease Son        passed away    Allergies  Allergen Reactions  . Buprenorphine Hcl Rash  . Morphine And Related Rash    Current Outpatient Medications on File Prior to Visit  Medication Sig Dispense Refill  . acetaminophen (TYLENOL) 500 MG tablet Take 2 tablets (1,000 mg total) by mouth 2 (two) times daily as needed for moderate pain. 30 tablet 0  .  aspirin EC 81 MG tablet Take 81 mg by mouth daily.    .Marland Kitchenatorvastatin (LIPITOR) 10 MG tablet TAKE 1 TABLET BY MOUTH ONCE DAILY 90 tablet 0  . Blood Glucose Monitoring Suppl (ONE TOUCH ULTRA 2) w/Device KIT Use to check blood sugar daily dx code 250.00 1 each 0  . diclofenac sodium (VOLTAREN) 1 % GEL Apply 4 g topically 4 (four) times daily. (Patient taking differently: Apply 4 g topically daily at 12 noon. ) 100 g 0  . DULERA 200-5 MCG/ACT AERO INHALE ONE PUFF INTO LUNGS EVERY 12 HOURS 1 Inhaler 5  . fluticasone (FLONASE) 50 MCG/ACT nasal spray INSTILL 2 SPRAYS IN EACH NOSTRIL DAILY 16 g 2  . gabapentin (NEURONTIN) 100 MG capsule TAKE 1 CAPSULE BY MOUTH TWICE DAILY 180 capsule 1  . glucose blood (ACCU-CHEK AVIVA) test strip Use as instructed 100 each 12  . metFORMIN (GLUCOPHAGE) 500 MG tablet Take 1 tablet (500 mg total) by mouth 2 (two) times daily with a meal. 180 tablet 3  . nitroGLYCERIN (NITROSTAT) 0.4 MG SL tablet Place 1 tablet (0.4 mg total) under the tongue every 5 (five) minutes as needed for chest pain. 20 tablet 1  . omeprazole (PRILOSEC) 20 MG capsule TAKE 1 CAPSULE BY MOUTH EVERY MORNING 30 capsule 5  . ONETOUCH DELICA LANCETS 322QMISC USE AS DIRECTED TO CHECK BLOOD SUGAR EVERY DAY Dx code E11.9 100 each 1  . PROAIR HFA 108 (90 Base) MCG/ACT inhaler INHALE 2 PUFFS BY MOUTH FOUR TIMES A DAY AS NEEDED 8.5 each 3  . Vitamin D, Ergocalciferol, (DRISDOL) 50000 units CAPS capsule TAKE 1 CAPSULE BY MOUTH ONCE WEEKLY 4 capsule 5   No current facility-administered medications on file prior to visit.     BP (!) 138/56 (BP Location: Right Arm, Patient Position: Sitting, Cuff Size: Normal)   Pulse 79   Temp 98 F (36.7 C) (Oral)   Ht 5' 1"  (1.549 m)   Wt 186 lb (84.4 kg)   SpO2 100%   BMI 35.14 kg/m    Objective:   Physical Exam  Constitutional: She appears well-developed and well-nourished.  Cardiovascular: Normal rate, regular rhythm and normal heart sounds.  No  murmur  heard. Pulmonary/Chest: Effort normal and breath sounds normal. No respiratory distress. She has no wheezes.  Musculoskeletal:  No swelling noted of left upper extremity  Psychiatric: She has a normal mood and affect. Her behavior is normal. Judgment and thought content normal.          Assessment & Plan:  Hypertension-blood pressure stable on current medication.  Continue same.  Diabetes type 2-this is stable on metformin.  Defer management to endocrinology.  Anemia-recommended addition of Centrum Silver for women.  Her B12 folate and iron levels all look okay.  We will send her home with an I fob kit to complete and return at her earliest convenience.  She was heme-negative back in 2018 on rectal exam.  Lumbar radiculopathy-stable/improved.  Monitor

## 2018-01-21 NOTE — Patient Instructions (Signed)
Please complete stool kit and mail back at your earliest convenience.  

## 2018-01-28 ENCOUNTER — Ambulatory Visit: Payer: Medicare Other | Admitting: Podiatry

## 2018-02-04 ENCOUNTER — Other Ambulatory Visit (INDEPENDENT_AMBULATORY_CARE_PROVIDER_SITE_OTHER): Payer: Medicare Other

## 2018-02-04 DIAGNOSIS — D649 Anemia, unspecified: Secondary | ICD-10-CM | POA: Diagnosis not present

## 2018-02-04 LAB — FECAL OCCULT BLOOD, IMMUNOCHEMICAL: Fecal Occult Bld: NEGATIVE

## 2018-02-10 ENCOUNTER — Other Ambulatory Visit: Payer: Self-pay | Admitting: Family

## 2018-02-20 ENCOUNTER — Emergency Department (HOSPITAL_COMMUNITY)
Admission: EM | Admit: 2018-02-20 | Discharge: 2018-02-20 | Disposition: A | Discharge status: Home / Self Care | Payer: Medicare Other | Attending: Emergency Medicine | Admitting: Emergency Medicine

## 2018-02-20 ENCOUNTER — Encounter (HOSPITAL_COMMUNITY): Payer: Self-pay | Admitting: Student

## 2018-02-20 ENCOUNTER — Telehealth: Payer: Self-pay | Admitting: *Deleted

## 2018-02-20 ENCOUNTER — Other Ambulatory Visit: Payer: Self-pay

## 2018-02-20 DIAGNOSIS — E119 Type 2 diabetes mellitus without complications: Secondary | ICD-10-CM | POA: Insufficient documentation

## 2018-02-20 DIAGNOSIS — Z79899 Other long term (current) drug therapy: Secondary | ICD-10-CM | POA: Insufficient documentation

## 2018-02-20 DIAGNOSIS — I509 Heart failure, unspecified: Secondary | ICD-10-CM | POA: Insufficient documentation

## 2018-02-20 DIAGNOSIS — Z9049 Acquired absence of other specified parts of digestive tract: Secondary | ICD-10-CM | POA: Diagnosis not present

## 2018-02-20 DIAGNOSIS — Z7982 Long term (current) use of aspirin: Secondary | ICD-10-CM | POA: Insufficient documentation

## 2018-02-20 DIAGNOSIS — Z8673 Personal history of transient ischemic attack (TIA), and cerebral infarction without residual deficits: Secondary | ICD-10-CM | POA: Diagnosis not present

## 2018-02-20 DIAGNOSIS — Z7984 Long term (current) use of oral hypoglycemic drugs: Secondary | ICD-10-CM | POA: Diagnosis not present

## 2018-02-20 DIAGNOSIS — N3001 Acute cystitis with hematuria: Secondary | ICD-10-CM | POA: Insufficient documentation

## 2018-02-20 DIAGNOSIS — R319 Hematuria, unspecified: Secondary | ICD-10-CM | POA: Diagnosis present

## 2018-02-20 DIAGNOSIS — J45909 Unspecified asthma, uncomplicated: Secondary | ICD-10-CM | POA: Diagnosis not present

## 2018-02-20 DIAGNOSIS — I110 Hypertensive heart disease with heart failure: Secondary | ICD-10-CM | POA: Diagnosis not present

## 2018-02-20 LAB — BASIC METABOLIC PANEL
Anion gap: 13 (ref 5–15)
BUN: 20 mg/dL (ref 8–23)
CALCIUM: 9.1 mg/dL (ref 8.9–10.3)
CO2: 19 mmol/L — ABNORMAL LOW (ref 22–32)
Chloride: 110 mmol/L (ref 98–111)
Creatinine, Ser: 1.01 mg/dL — ABNORMAL HIGH (ref 0.44–1.00)
GFR calc Af Amer: 56 mL/min — ABNORMAL LOW (ref >60)
GFR, EST NON AFRICAN AMERICAN: 49 mL/min — AB (ref >60)
Glucose, Bld: 94 mg/dL (ref 70–99)
POTASSIUM: 4.3 mmol/L (ref 3.5–5.1)
Sodium: 142 mmol/L (ref 135–145)

## 2018-02-20 LAB — CBC WITH DIFFERENTIAL/PLATELET
Abs Immature Granulocytes: 0.08 10*3/uL — ABNORMAL HIGH (ref 0.00–0.07)
BASOS PCT: 1 %
Basophils Absolute: 0 10*3/uL (ref 0.0–0.1)
EOS ABS: 0.3 10*3/uL (ref 0.0–0.5)
Eosinophils Relative: 4 %
HCT: 37.4 % (ref 36.0–46.0)
Hemoglobin: 11.3 g/dL — ABNORMAL LOW (ref 12.0–15.0)
Immature Granulocytes: 1 %
Lymphocytes Relative: 36 %
Lymphs Abs: 2.8 10*3/uL (ref 0.7–4.0)
MCH: 28.3 pg (ref 26.0–34.0)
MCHC: 30.2 g/dL (ref 30.0–36.0)
MCV: 93.7 fL (ref 80.0–100.0)
MONO ABS: 0.7 10*3/uL (ref 0.1–1.0)
Monocytes Relative: 10 %
NEUTROS ABS: 3.8 10*3/uL (ref 1.7–7.7)
Neutrophils Relative %: 48 %
Platelets: 142 10*3/uL — ABNORMAL LOW (ref 150–400)
RBC: 3.99 MIL/uL (ref 3.87–5.11)
RDW: 14.7 % (ref 11.5–15.5)
WBC: 7.7 10*3/uL (ref 4.0–10.5)
nRBC: 0 % (ref 0.0–0.2)

## 2018-02-20 LAB — URINALYSIS, ROUTINE W REFLEX MICROSCOPIC
Bilirubin Urine: NEGATIVE
Glucose, UA: NEGATIVE mg/dL
KETONES UR: NEGATIVE mg/dL
Nitrite: NEGATIVE
PROTEIN: NEGATIVE mg/dL
Specific Gravity, Urine: 1.008 (ref 1.005–1.030)
pH: 5 (ref 5.0–8.0)

## 2018-02-20 MED ORDER — CEPHALEXIN 500 MG PO CAPS: 500.0000 mg | ORAL_CAPSULE | Freq: Two times a day (BID) | ORAL | 0 refills | Status: DC

## 2018-02-20 NOTE — ED Provider Notes (Signed)
Medical screening examination/treatment/procedure(s) were conducted as a shared visit with non-physician practitioner(s) and myself.  I personally evaluated the patient during the encounter.  Pt presents with complaints of dysuria and hematurai.  No fevers or abdominal pain.  Will check labs, UA.  Sx suggest UTI.    Dorie Rank, MD 02/20/18 [phone]

## 2018-02-20 NOTE — Telephone Encounter (Signed)
Received Home Health Certification and Plan of Care Addendum; forwarded to provider/SLS 11/08

## 2018-02-20 NOTE — ED Triage Notes (Signed)
C/o pressure with urination and states she has noticed some blood in her urine at times. States its not every time .

## 2018-02-20 NOTE — Discharge Instructions (Addendum)
You are seen in the emergency department today for blood in your urine and pain with urination.  Your urinalysis looks infected therefore we are treating with you for UTI with Keflex.  These take Keflex once in the morning and once in the evening for the next 5 days.  We have sent your urine for culture to ensure that this is the best antibiotic, if it is not we will call you and change this as needed.  We have prescribed you new medication(s) today. Discuss the medications prescribed today with your pharmacist as they can have adverse effects and interactions with your other medicines including over the counter and prescribed medications. Seek medical evaluation if you start to experience new or abnormal symptoms after taking one of these medicines, seek care immediately if you start to experience difficulty breathing, feeling of your throat closing, facial swelling, or rash as these could be indications of a more serious allergic reaction  Please follow-up with your primary care provider within 3 to 5 days for reevaluation.  Return to the ER for new or worsening symptoms including but not limited to fever, back pain, vomiting, inability to urinate, or any other concerns that you may have.

## 2018-02-20 NOTE — ED Provider Notes (Signed)
[facility] EMERGENCY DEPARTMENT Provider Note   CSN: [id number] Arrival date & time: 02/20/18  0645   History   Chief Complaint Chief Complaint  Patient presents with  . Hematuria    HPI Angelica Benson is a 84 y.o. female with a hx of asthma, DM, CHF, HTN, liver mass?, and prior cholecystectomy and abdominal hysterectomy who presents to the ED with complaints of intermittent hematuria since yesterday. Patient states she has noted 3 episodes of blood in her urine described as mostly urine looking redder than usual with occasional minimal clot output, she reports that between these episodes she has had non bloody urine as well. She states she has had associated urinary urgency and dysuria. No specific alleviating/aggravating factors. She states sxs feel similar to prior UTIs which she has been seen for here previously. Denies fever, chills, nausea, vomiting, abdominal pain, or flank pain.   HPI  Past Medical History:  Diagnosis Date  . Arthritis   . Asthma   . CHF (congestive heart failure) ([place])   . Chronic headaches   . Constipation   . Diabetes mellitus without complication ([place])   . Hemorrhoids without complication   . Hypertension     Patient Active Problem List   Diagnosis Date Noted  . Dizziness 10/02/2017  . Liver mass 10/02/2017  . Orthostatic hypotension 10/02/2017  . Left flank pain 10/02/2017  . Arthritis 02/19/2017  . Hypertension   . Diabetes mellitus without complication ([place])   . Chronic headaches   . CHF (congestive heart failure) ([place])   . Asthma   . Carpal tunnel syndrome 09/04/2015  . Cervical disc disorder with radiculopathy of cervical region   . TIA (transient ischemic attack) 03/22/2015  . Obesity, unspecified 08/18/2013  . Goiter 08/18/2013  . Anemia, chronic disease 08/18/2013    Past Surgical History:  Procedure Laterality Date  . ABDOMINAL HYSTERECTOMY    . BACK SURGERY    . CHOLECYSTECTOMY    . KNEE SURGERY        OB History   None      Home Medications    Prior to Admission medications   Medication Sig Start Date End Date Taking? Authorizing Provider  acetaminophen (TYLENOL) 500 MG tablet Take 2 tablets (1,000 mg total) by mouth 2 (two) times daily as needed for moderate pain. 08/15/17   Debbrah Alar, NP  aspirin EC 81 MG tablet Take 81 mg by mouth daily.    [provider]  atorvastatin (LIPITOR) 10 MG tablet TAKE 1 TABLET BY MOUTH ONCE DAILY 02/11/18   Debbrah Alar, NP  Blood Glucose Monitoring Suppl (ONE TOUCH ULTRA 2) w/Device KIT Use to check blood sugar daily dx code 250.00 07/03/16   Debbrah Alar, NP  diclofenac sodium (VOLTAREN) 1 % GEL Apply 4 g topically 4 (four) times daily. Patient taking differently: Apply 4 g topically daily at 12 noon.  01/21/17   Forde Dandy, MD  DULERA 200-5 MCG/ACT AERO INHALE ONE PUFF INTO LUNGS EVERY 12 HOURS 08/13/17   Debbrah Alar, NP  fluticasone [facility]) 50 MCG/ACT nasal spray INSTILL 2 SPRAYS IN EACH NOSTRIL DAILY 12/10/17   Debbrah Alar, NP  gabapentin (NEURONTIN) 100 MG capsule TAKE 1 CAPSULE BY MOUTH TWICE DAILY 10/10/17   Debbrah Alar, NP  glucose blood (ACCU-CHEK AVIVA) test strip Use as instructed 07/23/17   Debbrah Alar, NP  metFORMIN (GLUCOPHAGE) 500 MG tablet Take 1 tablet (500 mg total) by mouth 2 (two) times daily with a meal.  12/11/17   Elayne Snare, MD  nitroGLYCERIN (NITROSTAT) 0.4 MG SL tablet Place 1 tablet (0.4 mg total) under the tongue every 5 (five) minutes as needed for chest pain. 10/28/17   Debbrah Alar, NP  omeprazole (PRILOSEC) 20 MG capsule TAKE 1 CAPSULE BY MOUTH EVERY MORNING 10/10/17   Debbrah Alar, NP  [facility] DELICA LANCETS 21J MISC USE AS DIRECTED TO CHECK BLOOD SUGAR EVERY DAY Dx code E11.9 01/15/17   Debbrah Alar, NP  PROAIR HFA 108 [phone] Base) MCG/ACT inhaler INHALE 2 PUFFS BY MOUTH FOUR TIMES A DAY AS NEEDED 10/15/17   Debbrah Alar, NP  Vitamin  D, Ergocalciferol, (DRISDOL) 50000 units CAPS capsule TAKE 1 CAPSULE BY MOUTH ONCE WEEKLY 12/10/17   Debbrah Alar, NP    Family History Family History  Problem Relation Age of Onset  . Diabetes Son   . Hypertension Son   . Cancer Sister        breast  . Diabetes Daughter        plus 2 grandkids  . Stroke Son        46moage dx, has enlarged heart  . Heart disease Son        passed away    Social History Social History   Tobacco Use  . Smoking status: Never Smoker  . Smokeless tobacco: Never Used  Substance Use Topics  . Alcohol use: No  . Drug use: No     Allergies   Buprenorphine hcl and Morphine and related   Review of Systems Review of Systems  Constitutional: Negative for chills and fever.  Respiratory: Negative for shortness of breath.   Cardiovascular: Negative for chest pain.  Gastrointestinal: Negative for abdominal pain, constipation, diarrhea, nausea and vomiting.  Genitourinary: Positive for dysuria, hematuria and urgency. Negative for flank pain.  All other systems reviewed and are negative.   Physical Exam Updated Vital Signs BP (!) 158/54   Pulse 65   Temp (!) 97.3 F (36.3 C) (Oral)   Resp 18   Ht 5' (1.524 m)   Wt 85.7 kg   SpO2 100%   BMI 36.91 kg/m   Physical Exam  Constitutional: She appears well-developed and well-nourished.  Non-toxic appearance. No distress.  HENT:  Head: Normocephalic and atraumatic.  Eyes: Conjunctivae are normal. Right eye exhibits no discharge. Left eye exhibits no discharge.  Neck: Neck supple.  Cardiovascular: Normal rate and regular rhythm.  Pulmonary/Chest: Effort normal and breath sounds normal. No respiratory distress. She has no wheezes. She has no rhonchi. She has no rales.  Respiration even and unlabored  Abdominal: Soft. She exhibits no distension. There is no tenderness. There is no rigidity, no rebound, no guarding and no CVA tenderness.  Neurological: She is alert.  Clear speech.   Skin:  Skin is warm and dry. No rash noted.  Psychiatric: She has a normal mood and affect. Her behavior is normal.  Nursing note and vitals reviewed.    ED Treatments / Results  Labs (all labs ordered are listed, but only abnormal results are displayed) Labs Reviewed  URINALYSIS, ROUTINE W REFLEX MICROSCOPIC - Abnormal; Notable for the following components:      Result Value   APPearance CLOUDY (*)    Hgb urine dipstick MODERATE (*)    Leukocytes, UA LARGE (*)    RBC / HPF >50 (*)    WBC, UA >50 (*)    Bacteria, UA RARE (*)    All other components within normal limits  BASIC METABOLIC  PANEL - Abnormal; Notable for the following components:   CO2 19 (*)    Creatinine, Ser 1.01 (*)    GFR calc non Af Amer 49 (*)    GFR calc Af Amer 56 (*)    All other components within normal limits  CBC WITH DIFFERENTIAL/PLATELET - Abnormal; Notable for the following components:   Hemoglobin 11.3 (*)    Platelets 142 (*)    Abs Immature Granulocytes 0.08 (*)    All other components within normal limits  URINE CULTURE    EKG None  Radiology No results found.  Procedures Procedures (including critical care time)  Medications Ordered in ED Medications - No data to display   Initial Impression / Assessment and Plan / ED Course  I have reviewed the triage vital signs and the nursing notes.  Pertinent labs & imaging results that were available during my care of the patient were reviewed by me and considered in my medical decision making (see chart for details).   Patient presents to the ED with urinary complaints including hematuria, dysuria, and urgency. Patient nontoxic appearing, she is afebrile, vitals with elevated BP, doubt HTN emergency. DDX: UTI, pyelonephritis, nephrolithiasis, also would consider oncologic etiology given age with hematuria. Evaluation with basic labs as well as urinalysis and urine culture.   Renal function with very mild elevation- PCP recheck. Hgb is baseline.  Urinalysis concerning for infection, culture sent. She is without nausea, vomiting, fever, or flank/abdominal pain to raise concern for pyelonephritis, given painless doubt nephrolithiasis. She does not appear septic. Feel she is appropriate for outpatient tx for UTI. Previously treated with Keflex with good response- no recent urine cultures to base tx off of therefore will prescribe Keflex (Discussed with pharmacist Apolonio Schneiders- recommends 500 mg BID for 5 days) with PCP follow up and strict return precautions. I discussed results, treatment plan, need for PCP follow-up, and return precautions with the patient and her mother at bedside. Provided opportunity for questions, patient and her daughter confirmed understanding and are in agreement with plan.   Findings and plan of care discussed with supervising physician Dr. Tomi Bamberger who is in agreement.   Final Clinical Impressions(s) / ED Diagnoses   Final diagnoses:  Acute cystitis with hematuria    ED Discharge Orders         Ordered    cephALEXin (KEFLEX) 500 MG capsule  2 times daily     02/20/18 1034           Reid Regas, Lou­za R, PA-C 02/20/18 [place], DO 02/20/18 2301

## 2018-02-21 LAB — URINE CULTURE: Culture: <10000 — AB

## 2018-02-24 ENCOUNTER — Ambulatory Visit (INDEPENDENT_AMBULATORY_CARE_PROVIDER_SITE_OTHER): Payer: Medicare Other | Admitting: Podiatry

## 2018-02-24 ENCOUNTER — Encounter: Payer: Self-pay | Admitting: Podiatry

## 2018-02-24 DIAGNOSIS — M79675 Pain in left toe(s): Secondary | ICD-10-CM

## 2018-02-24 DIAGNOSIS — M79674 Pain in right toe(s): Secondary | ICD-10-CM

## 2018-02-24 DIAGNOSIS — B351 Tinea unguium: Secondary | ICD-10-CM

## 2018-02-24 DIAGNOSIS — E119 Type 2 diabetes mellitus without complications: Secondary | ICD-10-CM

## 2018-02-24 NOTE — Progress Notes (Signed)
Patient ID: Angelica Benson, female   DOB: 11/01/1928, 84 y.o.   MRN: [id number] Complaint:  Visit Type: Patient returns to my office for continued preventative foot care services. Complaint: Patient states" my nails have grown long and thick and become painful to walk and wear shoes" Patient has been diagnosed with DM with neuropathy.. The patient presents for preventative foot care services. No changes to ROS  Podiatric Exam: Vascular: dorsalis pedis and posterior tibial pulses are palpable bilateral. Capillary return is immediate. Temperature gradient is WNL. Skin turgor WNL  Sensorium: Diminished  Semmes Weinstein monofilament test. Normal tactile sensation bilaterally. Nail Exam: Pt has thick disfigured discolored nails with subungual debris noted bilateral entire nail hallux through fifth toenails Ulcer Exam: There is no evidence of ulcer or pre-ulcerative changes or infection. Orthopedic Exam: Muscle tone and strength are WNL. No limitations in general ROM. No crepitus or effusions noted. Foot type and digits show no abnormalities. Bony prominences are unremarkable. Skin: No Porokeratosis. No infection or ulcers  Diagnosis:  Onychomycosis, , Pain in right toe, pain in left toes  Treatment & Plan Procedures and Treatment: Consent by patient was obtained for treatment procedures. The patient understood the discussion of treatment and procedures well. All questions were answered thoroughly reviewed. Debridement of mycotic and hypertrophic toenails, 1 through 5 bilateral and clearing of subungual debris. No ulceration, no infection noted.   Return Visit-Office Procedure: Patient instructed to return to the office for a follow up visit 3 months for continued evaluation and treatment.  Shandi Godfrey DPM 

## 2018-02-24 NOTE — Patient Instructions (Signed)

## 2018-03-18 ENCOUNTER — Ambulatory Visit (INDEPENDENT_AMBULATORY_CARE_PROVIDER_SITE_OTHER): Payer: Medicare Other | Admitting: Endocrinology

## 2018-03-18 ENCOUNTER — Encounter: Payer: Self-pay | Admitting: Endocrinology

## 2018-03-18 ENCOUNTER — Ambulatory Visit: Payer: Medicare Other | Admitting: Endocrinology

## 2018-03-18 VITALS — BP 142/68 | HR 84 | Ht 60.0 in | Wt 187.2 lb

## 2018-03-18 DIAGNOSIS — M7552 Bursitis of left shoulder: Secondary | ICD-10-CM

## 2018-03-18 DIAGNOSIS — E1165 Type 2 diabetes mellitus with hyperglycemia: Secondary | ICD-10-CM

## 2018-03-18 LAB — POCT GLYCOSYLATED HEMOGLOBIN (HGB A1C): HEMOGLOBIN A1C: 5.6 % (ref 4.0–5.6)

## 2018-03-18 LAB — GLUCOSE, POCT (MANUAL RESULT ENTRY): POC Glucose: 124 mg/dl — AB (ref 70–99)

## 2018-03-18 NOTE — Progress Notes (Signed)
Patient ID: Angelica Benson, female   DOB: 08-01-1928, 84 y.o.   MRN: [id number]   Reason for Appointment : Followup of diabetes and other issues  History of Present Illness          Diagnosis: Type 2 diabetes mellitus, date of diagnosis: 1992       Past history: She is unclear about the date of her diagnosis and treatment history. At onset she had symptoms of frequent urination, weakness and sleepiness. She has been on various oral hypoglycemic drugs since her diagnosis. Has been on metformin since at least 2006 and Januvia since 2007. Also previously had taken glyburide and Avandia Her A1c has generally been in the 5.9-6.4 range previously  Recent history:   Oral hypoglycemic drugs the patient is taking are: Metformin 500 mg 2x a day, Januvia 167m     Her A1c is now 5.6   Current management:  She was off metformin for a couple of months when she was seen in August and her A1c was starting to go up  Because of her tendency to higher readings and difficulty with weight loss she was started back on metformin  She is tolerating this well without any nausea or side effects like diarrhea  Her daughter has checked her blood sugars sporadically every couple of weeks and she thinks the highest reading is about 131 but does not remember other readings  Today blood sugar is 124 in the office  She is watching her portions and her weight is slightly better  Renal function stays near normal     Side effects from medications have been: None  Glucose monitoring:  Rarely.   Glucometer:  Accu-Chek guide     Blood Glucose readings as above  Hypoglycemia:   none.   Self-care: The diet that the patient has been following is: tries to limit high-fat foods     Meals: 3 meals per day.  at breakfast has instant oatmeal and toast.  At lunch will have sandwich and fruit; dinner is chicken, starch and vegetables; her snacks are fruit, peanut butter or cheese crackers      Exercise:     will walk some around her living area, uses a walker  Dietician visit: Most recent: 2000            Compliance with the medical regimen: Fairly good  Weight history:  Wt Readings from Last 3 Encounters:  03/18/18 187 lb 3.2 oz (84.9 kg)  02/20/18 189 lb (85.7 kg)  01/21/18 186 lb (84.4 kg)   Glycemic control:   Lab Results  Component Value Date   HGBA1C 5.6 03/18/2018   HGBA1C 6.9 (A) 12/11/2017   HGBA1C 5.5 08/06/2017   Lab Results  Component Value Date   MICROALBUR <0.7 04/04/2017   LDLCALC 36 12/11/2017   CREATININE 1.01 (H) 02/20/2018        Allergies as of 03/18/2018      Reactions   Buprenorphine Hcl Rash   Morphine And Related Rash      Medication List        Accurate as of 03/18/18  8:57 PM. Always use your most recent med list.          acetaminophen 500 MG tablet Commonly known as:  TYLENOL Take 2 tablets (1,000 mg total) by mouth 2 (two) times daily as needed for moderate pain.   aspirin EC 81 MG tablet Take 81 mg by mouth daily.   atorvastatin 10 MG tablet Commonly  known as:  LIPITOR TAKE 1 TABLET BY MOUTH ONCE DAILY   diclofenac sodium 1 % Gel Commonly known as:  VOLTAREN Apply 4 g topically 4 (four) times daily.   DULERA 200-5 MCG/ACT Aero Generic drug:  mometasone-formoterol INHALE ONE PUFF INTO LUNGS EVERY 12 HOURS   fluticasone 50 MCG/ACT nasal spray Commonly known as:  FLONASE INSTILL 2 SPRAYS IN EACH NOSTRIL DAILY   gabapentin 100 MG capsule Commonly known as:  NEURONTIN TAKE 1 CAPSULE BY MOUTH TWICE DAILY   glucose blood test strip Use as instructed   metFORMIN 500 MG tablet Commonly known as:  GLUCOPHAGE Take 1 tablet (500 mg total) by mouth 2 (two) times daily with a meal.   nitroGLYCERIN 0.4 MG SL tablet Commonly known as:  NITROSTAT Place 1 tablet (0.4 mg total) under the tongue every 5 (five) minutes as needed for chest pain.   omeprazole 20 MG capsule Commonly known as:  PRILOSEC TAKE 1 CAPSULE BY MOUTH  EVERY MORNING   ONE TOUCH ULTRA 2 w/Device Kit Use to check blood sugar daily dx code 371.69   ONETOUCH DELICA LANCETS 67E Misc USE AS DIRECTED TO CHECK BLOOD SUGAR EVERY DAY Dx code E11.9   PROAIR HFA 108 (90 Base) MCG/ACT inhaler Generic drug:  albuterol INHALE 2 PUFFS BY MOUTH FOUR TIMES A DAY AS NEEDED   Vitamin D (Ergocalciferol) 1.25 MG (50000 UT) Caps capsule Commonly known as:  DRISDOL TAKE 1 CAPSULE BY MOUTH ONCE WEEKLY       Allergies:  Allergies  Allergen Reactions  . Buprenorphine Hcl Rash  . Morphine And Related Rash    Past Medical History:  Diagnosis Date  . Arthritis   . Asthma   . CHF (congestive heart failure) ([place])   . Chronic headaches   . Constipation   . Diabetes mellitus without complication ([place])   . Hemorrhoids without complication   . Hypertension     Past Surgical History:  Procedure Laterality Date  . ABDOMINAL HYSTERECTOMY    . BACK SURGERY    . CHOLECYSTECTOMY    . KNEE SURGERY      Family History  Problem Relation Age of Onset  . Diabetes Son   . Hypertension Son   . Cancer Sister        breast  . Diabetes Daughter        plus 2 grandkids  . Stroke Son        16moage dx, has enlarged heart  . Heart disease Son        passed away    Social History:  reports that she has never smoked. She has never used smokeless tobacco. She reports that she does not drink alcohol or use drugs.    Review of Systems         Lipids: Has not been on treatment with a statin drug, levels are normal without statin drugs  Lab Results  Component Value Date   CHOL 110 12/11/2017   HDL 61.90 12/11/2017   LDLCALC 36 12/11/2017   TRIG 65.0 12/11/2017   CHOLHDL 2 12/11/2017       The blood pressure has been controlled with HCTZ, followed by PCP    History of anemia, she sees her PCP regularly:  Lab Results  Component Value Date   WBC 7.7 02/20/2018   HGB 11.3 (L) 02/20/2018   HCT 37.4 02/20/2018   MCV 93.7 02/20/2018   PLT 142  (L) 02/20/2018     NEUROPATHY: On 100  mg gabapentin At bedtime, taking it  regularly otherwise her legs will ache or tingle     Diabetic foot exam in 1/17    She has a history of a small goiter in the past, euthyroid  Lab Results  Component Value Date   TSH 1.94 12/11/2017    Physical Examination:  BP (!) 142/68 (BP Location: Left Arm, Patient Position: Sitting, Cuff Size: Normal)   Pulse 84   Ht 5' (1.524 m)   Wt 187 lb 3.2 oz (84.9 kg)   SpO2 97%   BMI 36.56 kg/m      ASSESSMENT/ PLAN:   Diabetes type 2 with obesity, relatively mild and well controlled  See history of present illness for  discussion of current diabetes management, blood sugar patterns and problems identified  She appears to have fairly good control now considering her age A1c 6.3  Did not bring her monitor for download and she does not remember her readings She had taken metformin safely before and since her renal function is normal she can take at least 1000 mg a day, discussed that 500 mg once a day is not very effective  Her daughter is going to be involved in her care now including meal planning and medications Discussed checking blood sugars at different times by rotation, no more than one today  There are no Patient Instructions on file for this visit.   Elayne Snare 03/18/2018, 8:57 PM            Patient ID: Angelica Benson, female   DOB: [birth year], 84 y.o.   MRN: [id number]   Reason for Appointment : Followup of diabetes and other issues  History of Present Illness          Diagnosis: Type 2 diabetes mellitus, date of diagnosis: 1992       Past history: She is unclear about the date of her diagnosis and treatment history. At onset she had symptoms of frequent urination, weakness and sleepiness. She has been on various oral hypoglycemic drugs since her diagnosis. Has been on metformin since at least 2006 and Januvia since 2007. Also previously had taken glyburide and Avandia Her A1c  has generally been in the 5.9-6.4 range previously  Recent history:   She is on Januvia and her metformin was supposed to be increase back to twice a day but she is still taking it only once a day  She has had fairly consistent control, A1c 6.3  She was given a new glucose monitor on the last visit and she was probably getting falsely high readings previously Did not bring her monitor for download today She is not having any side effects from metformin but only taking it in the morning Her weight has leveled off  Oral hypoglycemic drugs the patient is taking are: Metformin 500 mg once a day, Januvia 134m      Side effects from medications have been: None  Glucose monitoring:  done once a day or less.   Glucometer: One Touch.       Blood Glucose readings not available today    Hypoglycemia:   none.   Self-care: The diet that the patient has been following is: tries to limit high-fat foods    Meals: 3 meals per day.  at breakfast has oatmeal and toast. At lunch will have sandwich and fruit; dinner is chicken, starch and vegetables; her snacks are fruit, peanut butter or cheese crackers      Exercise:    will walk  around her living area Dietician visit: Most recent: 2000            Compliance with the medical regimen: Good  Weight history:  Wt Readings from Last 3 Encounters:  03/18/18 187 lb 3.2 oz (84.9 kg)  02/20/18 189 lb (85.7 kg)  01/21/18 186 lb (84.4 kg)   Glycemic control:   Lab Results  Component Value Date   HGBA1C 5.6 03/18/2018   HGBA1C 6.9 (A) 12/11/2017   HGBA1C 5.5 08/06/2017   Lab Results  Component Value Date   MICROALBUR <0.7 04/04/2017   LDLCALC 36 12/11/2017   CREATININE 1.01 (H) 02/20/2018      Office Visit on 03/18/2018  Component Date Value Ref Range Status  . Hemoglobin A1C 03/18/2018 5.6  4.0 - 5.6 % Final  . POC Glucose 03/18/2018 124* 70 - 99 mg/dl Final    Allergies as of 03/18/2018      Reactions   Buprenorphine Hcl Rash    Morphine And Related Rash      Medication List        Accurate as of 03/18/18  8:57 PM. Always use your most recent med list.          acetaminophen 500 MG tablet Commonly known as:  TYLENOL Take 2 tablets (1,000 mg total) by mouth 2 (two) times daily as needed for moderate pain.   aspirin EC 81 MG tablet Take 81 mg by mouth daily.   atorvastatin 10 MG tablet Commonly known as:  LIPITOR TAKE 1 TABLET BY MOUTH ONCE DAILY   diclofenac sodium 1 % Gel Commonly known as:  VOLTAREN Apply 4 g topically 4 (four) times daily.   DULERA 200-5 MCG/ACT Aero Generic drug:  mometasone-formoterol INHALE ONE PUFF INTO LUNGS EVERY 12 HOURS   fluticasone 50 MCG/ACT nasal spray Commonly known as:  FLONASE INSTILL 2 SPRAYS IN EACH NOSTRIL DAILY   gabapentin 100 MG capsule Commonly known as:  NEURONTIN TAKE 1 CAPSULE BY MOUTH TWICE DAILY   glucose blood test strip Use as instructed   metFORMIN 500 MG tablet Commonly known as:  GLUCOPHAGE Take 1 tablet (500 mg total) by mouth 2 (two) times daily with a meal.   nitroGLYCERIN 0.4 MG SL tablet Commonly known as:  NITROSTAT Place 1 tablet (0.4 mg total) under the tongue every 5 (five) minutes as needed for chest pain.   omeprazole 20 MG capsule Commonly known as:  PRILOSEC TAKE 1 CAPSULE BY MOUTH EVERY MORNING   ONE TOUCH ULTRA 2 w/Device Kit Use to check blood sugar daily dx code 182.99   ONETOUCH DELICA LANCETS 37J Misc USE AS DIRECTED TO CHECK BLOOD SUGAR EVERY DAY Dx code E11.9   PROAIR HFA 108 (90 Base) MCG/ACT inhaler Generic drug:  albuterol INHALE 2 PUFFS BY MOUTH FOUR TIMES A DAY AS NEEDED   Vitamin D (Ergocalciferol) 1.25 MG (50000 UT) Caps capsule Commonly known as:  DRISDOL TAKE 1 CAPSULE BY MOUTH ONCE WEEKLY       Allergies:  Allergies  Allergen Reactions  . Buprenorphine Hcl Rash  . Morphine And Related Rash    Past Medical History:  Diagnosis Date  . Arthritis   . Asthma   . CHF (congestive heart  failure) ([place])   . Chronic headaches   . Constipation   . Diabetes mellitus without complication ([place])   . Hemorrhoids without complication   . Hypertension     Past Surgical History:  Procedure Laterality Date  . ABDOMINAL HYSTERECTOMY    .  BACK SURGERY    . CHOLECYSTECTOMY    . KNEE SURGERY      Family History  Problem Relation Age of Onset  . Diabetes Son   . Hypertension Son   . Cancer Sister        breast  . Diabetes Daughter        plus 2 grandkids  . Stroke Son        29moage dx, has enlarged heart  . Heart disease Son        passed away    Social History:  reports that she has never smoked. She has never used smokeless tobacco. She reports that she does not drink alcohol or use drugs.    Review of Systems   She is asking about left arm pain She thinks this is since her flu shot in August, also has some left finger joint pain       Lipids: She has been on Lipitor 10 mg since April 2019 by her PCP   Lab Results  Component Value Date   CHOL 110 12/11/2017   HDL 61.90 12/11/2017   LDLCALC 36 12/11/2017   TRIG 65.0 12/11/2017   CHOLHDL 2 12/11/2017    HYPERTENSION: Has been off all medications Followed by PCP   Lab Results  Component Value Date   CREATININE 1.01 (H) 02/20/2018   BUN 20 02/20/2018   NA 142 02/20/2018   K 4.3 02/20/2018   CL 110 02/20/2018   CO2 19 (L) 02/20/2018        NEUROPATHY: On 100 mg gabapentin long-term for treatment of tingling and aching in her legs  Diabetic foot exam in 8/18   She has a history of a small goiter, with normal TSH  Lab Results  Component Value Date   TSH 1.94 12/11/2017    Physical Examination:  BP (!) 142/68 (BP Location: Left Arm, Patient Position: Sitting, Cuff Size: Normal)   Pulse 84   Ht 5' (1.524 m)   Wt 187 lb 3.2 oz (84.9 kg)   SpO2 97%   BMI 36.56 kg/m   Mild tenderness around the lateral subacromial area on the left  ASSESSMENT/ PLAN:   Diabetes type 2 with obesity,  relatively mild and usually well controlled   See history of present illness for  discussion of current diabetes management, blood sugar patterns and problems identified  Her A1c is back down to 5.6, previously had gone up to 6.9  Blood sugars appear to be stabilized and back to good range with adding metformin 500 mg twice daily She had previously taking this long-term also and has no side effect Renal function is adequate for taking metformin Her family will continue to check her blood sugar periodically  HYPERTENSION: Continue follow-up with PCP  Left shoulder pain: Reassured her that this is not from her flu shot and it is likely to be from bursitis, she can use a topical analgesic OTC since she cannot afford Voltaren gel and follow-up with PCP  There are no Patient Instructions on file for this visit.   AElayne Snare12/07/2017, 8:57 PM

## 2018-03-25 ENCOUNTER — Ambulatory Visit: Payer: Medicare Other | Admitting: Podiatry

## 2018-04-14 ENCOUNTER — Other Ambulatory Visit: Payer: Self-pay | Admitting: Family

## 2018-04-14 DIAGNOSIS — M792 Neuralgia and neuritis, unspecified: Secondary | ICD-10-CM

## 2018-04-17 ENCOUNTER — Telehealth: Payer: Self-pay | Admitting: *Deleted

## 2018-04-17 NOTE — Telephone Encounter (Signed)
Received Home Health Certification and Plan of Care; forwarded to provider/SLS 01/03

## 2018-04-28 ENCOUNTER — Encounter: Payer: Self-pay | Admitting: Family

## 2018-04-28 ENCOUNTER — Ambulatory Visit (INDEPENDENT_AMBULATORY_CARE_PROVIDER_SITE_OTHER): Payer: Medicare Other | Admitting: Family

## 2018-04-28 VITALS — BP 152/72 | HR 77 | Temp 98.5°F | Resp 16 | Ht 60.0 in | Wt 185.0 lb

## 2018-04-28 DIAGNOSIS — D638 Anemia in other chronic diseases classified elsewhere: Secondary | ICD-10-CM | POA: Diagnosis not present

## 2018-04-28 DIAGNOSIS — E559 Vitamin D deficiency, unspecified: Secondary | ICD-10-CM

## 2018-04-28 DIAGNOSIS — I10 Essential (primary) hypertension: Secondary | ICD-10-CM

## 2018-04-28 DIAGNOSIS — E119 Type 2 diabetes mellitus without complications: Secondary | ICD-10-CM | POA: Diagnosis not present

## 2018-04-28 MED ORDER — CENTRUM SILVER 50+WOMEN PO TABS: 1.0000 | ORAL_TABLET | Freq: Every day | ORAL | Status: DC

## 2018-04-28 NOTE — Progress Notes (Signed)
Subjective:    Patient ID: Angelica Benson, female    DOB: 01-18-1929, 85 y.o.   MRN: [id number]  HPI  Patient is an 85 yr old female who presents today for follow up.  Anemia-on MVI with minerals.  Lab Results  Component Value Date   WBC 7.7 02/20/2018   HGB 11.3 (L) 02/20/2018   HCT 37.4 02/20/2018   MCV 93.7 02/20/2018   PLT 142 (L) 02/20/2018     HTN- not on bp meds currently. BP Readings from Last 3 Encounters:  04/28/18 (!) 152/72  03/18/18 (!) 142/68  02/20/18 (!) 138/53     DM2- maintained on metformin.  Followed by endocrinology.  Lab Results  Component Value Date   HGBA1C 5.6 03/18/2018   HGBA1C 6.9 (A) 12/11/2017   HGBA1C 5.5 08/06/2017   Lab Results  Component Value Date   MICROALBUR <0.7 04/04/2017   LDLCALC 36 12/11/2017   CREATININE 1.01 (H) 02/20/2018      Review of Systems See HPI  Past Medical History:  Diagnosis Date  . Arthritis   . Asthma   . CHF (congestive heart failure) ([place])   . Chronic headaches   . Constipation   . Diabetes mellitus without complication ([place])   . Hemorrhoids without complication   . Hypertension      Social History   Socioeconomic History  . Marital status: Single    Spouse name: Not on file  . Number of children: Not on file  . Years of education: Not on file  . Highest education level: Not on file  Occupational History  . Not on file  Social Needs  . Financial resource strain: Not on file  . Food insecurity:    Worry: Not on file    Inability: Not on file  . Transportation needs:    Medical: Not on file    Non-medical: Not on file  Tobacco Use  . Smoking status: Never Smoker  . Smokeless tobacco: Never Used  Substance and Sexual Activity  . Alcohol use: No  . Drug use: No  . Sexual activity: Not on file  Lifestyle  . Physical activity:    Days per week: Not on file    Minutes per session: Not on file  . Stress: Not on file  Relationships  . Social connections:    Talks on phone:  Not on file    Gets together: Not on file    Attends religious service: Not on file    Active member of club or organization: Not on file    Attends meetings of clubs or organizations: Not on file    Relationship status: Not on file  . Intimate partner violence:    Fear of current or ex partner: Not on file    Emotionally abused: Not on file    Physically abused: Not on file    Forced sexual activity: Not on file  Other Topics Concern  . Not on file  Social History Narrative   Lives at home by herself, aide in the am.     Has 4 daughters and 3 living sons (one son died).    Uses walker.     Divorced at age 63    Past Surgical History:  Procedure Laterality Date  . ABDOMINAL HYSTERECTOMY    . BACK SURGERY    . CHOLECYSTECTOMY    . KNEE SURGERY      Family History  Problem Relation Age of Onset  . Diabetes Son   .  Hypertension Son   . Cancer Sister        breast  . Diabetes Daughter        plus 2 grandkids  . Stroke Son        27moage dx, has enlarged heart  . Heart disease Son        passed away    Allergies  Allergen Reactions  . Buprenorphine Hcl Rash  . Morphine And Related Rash    Current Outpatient Medications on File Prior to Visit  Medication Sig Dispense Refill  . acetaminophen (TYLENOL) 500 MG tablet Take 2 tablets (1,000 mg total) by mouth 2 (two) times daily as needed for moderate pain. 30 tablet 0  . aspirin EC 81 MG tablet Take 81 mg by mouth daily.    .Marland Kitchenatorvastatin (LIPITOR) 10 MG tablet TAKE 1 TABLET BY MOUTH ONCE DAILY 90 tablet 1  . Blood Glucose Monitoring Suppl (ONE TOUCH ULTRA 2) w/Device KIT Use to check blood sugar daily dx code 250.00 1 each 0  . diclofenac sodium (VOLTAREN) 1 % GEL Apply 4 g topically 4 (four) times daily. (Patient taking differently: Apply 4 g topically daily at 12 noon. ) 100 g 0  . DULERA 200-5 MCG/ACT AERO INHALE ONE PUFF INTO LUNGS EVERY 12 HOURS 1 Inhaler 5  . fluticasone (FLONASE) 50 MCG/ACT nasal spray INSTILL 2  SPRAYS IN EACH NOSTRIL DAILY 16 g 2  . gabapentin (NEURONTIN) 100 MG capsule TAKE 1 CAPSULE BY MOUTH TWICE DAILY 180 capsule 1  . glucose blood (ACCU-CHEK AVIVA) test strip Use as instructed 100 each 12  . metFORMIN (GLUCOPHAGE) 500 MG tablet Take 1 tablet (500 mg total) by mouth 2 (two) times daily with a meal. 180 tablet 3  . nitroGLYCERIN (NITROSTAT) 0.4 MG SL tablet Place 1 tablet (0.4 mg total) under the tongue every 5 (five) minutes as needed for chest pain. 20 tablet 1  . omeprazole (PRILOSEC) 20 MG capsule TAKE 1 CAPSULE BY MOUTH EVERY MORNING 30 capsule 5  . ONETOUCH DELICA LANCETS 316PMISC USE AS DIRECTED TO CHECK BLOOD SUGAR EVERY DAY Dx code E11.9 100 each 1  . PROAIR HFA 108 (90 Base) MCG/ACT inhaler INHALE 2 PUFFS BY MOUTH FOUR TIMES A DAY AS NEEDED 8.5 each 3  . Vitamin D, Ergocalciferol, (DRISDOL) 50000 units CAPS capsule TAKE 1 CAPSULE BY MOUTH ONCE WEEKLY 4 capsule 5   No current facility-administered medications on file prior to visit.     BP (!) 152/72 (BP Location: Right Arm, Patient Position: Sitting, Cuff Size: Small)   Pulse 77   Temp 98.5 F (36.9 C) (Oral)   Resp 16   Ht 5' (1.524 m)   Wt 185 lb (83.9 kg)   SpO2 100%   BMI 36.13 kg/m       Objective:   Physical Exam Constitutional:      Appearance: She is well-developed.  Neck:     Musculoskeletal: Neck supple.     Thyroid: No thyromegaly.  Cardiovascular:     Rate and Rhythm: Normal rate and regular rhythm.     Heart sounds: Normal heart sounds. No murmur.  Pulmonary:     Effort: Pulmonary effort is normal. No respiratory distress.     Breath sounds: Normal breath sounds. No wheezing.  Skin:    General: Skin is warm and dry.  Neurological:     Mental Status: She is alert and oriented to person, place, and time.  Psychiatric:  Behavior: Behavior normal.        Thought Content: Thought content normal.        Judgment: Judgment normal.           Assessment & Plan:  DM2- stable and  at goal.   Vitamin D deficiency- needs vit D level- see phone note.  HTN- BP is acceptable for her age.  Monitor. She is scheduled for urine microalbumin with Endo.  If + could consider addition of low dose/ace or arb.   Anemia- stable. Continue mvi with min.

## 2018-05-01 ENCOUNTER — Telehealth: Payer: Self-pay | Admitting: Family

## 2018-05-01 DIAGNOSIS — E559 Vitamin D deficiency, unspecified: Secondary | ICD-10-CM

## 2018-05-01 NOTE — Telephone Encounter (Signed)
Please call pt's daughter and let her know that it looks like we haven't checked her vitamin D in a while and I would like to recheck please.  Can she schedule a lab appointment please?

## 2018-05-04 ENCOUNTER — Other Ambulatory Visit: Payer: Self-pay

## 2018-05-04 ENCOUNTER — Telehealth: Payer: Self-pay | Admitting: *Deleted

## 2018-05-04 DIAGNOSIS — E559 Vitamin D deficiency, unspecified: Secondary | ICD-10-CM

## 2018-05-04 NOTE — Telephone Encounter (Signed)
Advised patient's daughter of appointment needed to check vitamin D on patient. She was scheduled for 05-14-18.

## 2018-05-04 NOTE — Telephone Encounter (Signed)
Received Physician Orders/Revision POC from [facility]; forwarded to provider/SLS 01/20

## 2018-05-13 ENCOUNTER — Telehealth: Payer: Self-pay | Admitting: *Deleted

## 2018-05-13 ENCOUNTER — Encounter: Payer: Self-pay | Admitting: Family

## 2018-05-13 NOTE — Telephone Encounter (Signed)
Received Home Health Certification and Plan of Care; forwarded to provider/SLS 01/29

## 2018-05-14 ENCOUNTER — Other Ambulatory Visit: Payer: Self-pay | Admitting: Family

## 2018-05-14 ENCOUNTER — Telehealth: Payer: Self-pay | Admitting: Family

## 2018-05-14 ENCOUNTER — Other Ambulatory Visit (INDEPENDENT_AMBULATORY_CARE_PROVIDER_SITE_OTHER): Payer: Medicare Other

## 2018-05-14 DIAGNOSIS — E559 Vitamin D deficiency, unspecified: Secondary | ICD-10-CM

## 2018-05-14 NOTE — Telephone Encounter (Signed)
Pt came in office stating if provider can mail her the rx for medical equipment (mychart message from 05-13-2018)  to [facility]: [address] [place] [address]. Please advise.

## 2018-05-15 NOTE — Telephone Encounter (Signed)
Rx has been signed.  I notified daughter. Also, I spoke to daughter and let her know that I don't believe that she should be taking Kdur any longer. I believe she was only on this when she was on hctz which she is no longer on. Advised daughter to have pt d/c kdur if she is still taking.    Please cancel refill at pharmacy that I sent electronically this AM.

## 2018-05-15 NOTE — Telephone Encounter (Signed)
RX has been canceled and written order has been mailed to patient.

## 2018-05-19 ENCOUNTER — Telehealth: Payer: Self-pay | Admitting: Family

## 2018-05-19 LAB — VITAMIN D 1,25 DIHYDROXY
Vitamin D 1, 25 (OH)2 Total: 20 pg/mL (ref 18–72)
Vitamin D2 1, 25 (OH)2: 20 pg/mL
Vitamin D3 1, 25 (OH)2: <8 pg/mL

## 2018-05-19 MED ORDER — VITAMIN D3 75 MCG (3000 UT) PO TABS: 1.0000 | ORAL_TABLET | Freq: Every day | ORAL | Status: DC

## 2018-05-19 NOTE — Telephone Encounter (Signed)
Is pt taking vit D once weekly?  Vit D is low normal.  OK to switch to 3000iu once daily.

## 2018-05-20 NOTE — Telephone Encounter (Signed)
She is taking once a week 50,000 units of vitd. Information given to patient and her daughter about adding 3000ui daily.

## 2018-05-26 ENCOUNTER — Encounter: Payer: Self-pay | Admitting: Podiatry

## 2018-05-26 ENCOUNTER — Ambulatory Visit (INDEPENDENT_AMBULATORY_CARE_PROVIDER_SITE_OTHER): Payer: Medicare Other | Admitting: Podiatry

## 2018-05-26 DIAGNOSIS — B351 Tinea unguium: Secondary | ICD-10-CM

## 2018-05-26 DIAGNOSIS — M79674 Pain in right toe(s): Secondary | ICD-10-CM

## 2018-05-26 DIAGNOSIS — M79675 Pain in left toe(s): Secondary | ICD-10-CM

## 2018-05-26 DIAGNOSIS — E119 Type 2 diabetes mellitus without complications: Secondary | ICD-10-CM

## 2018-05-26 NOTE — Progress Notes (Signed)
Patient ID: Fabiana R Luhmann, female   DOB: 08/15/1928, 85 y.o.   MRN: [id number] Complaint:  Visit Type: Patient returns to my office for continued preventative foot care services. Complaint: Patient states" my nails have grown long and thick and become painful to walk and wear shoes" Patient has been diagnosed with DM with neuropathy.. The patient presents for preventative foot care services. No changes to ROS  Podiatric Exam: Vascular: dorsalis pedis and posterior tibial pulses are palpable bilateral. Capillary return is immediate. Temperature gradient is WNL. Skin turgor WNL  Sensorium: Diminished  Semmes Weinstein monofilament test. Normal tactile sensation bilaterally. Nail Exam: Pt has thick disfigured discolored nails with subungual debris noted bilateral entire nail hallux through fifth toenails Ulcer Exam: There is no evidence of ulcer or pre-ulcerative changes or infection. Orthopedic Exam: Muscle tone and strength are WNL. No limitations in general ROM. No crepitus or effusions noted. Foot type and digits show no abnormalities. Bony prominences are unremarkable. Skin: No Porokeratosis. No infection or ulcers  Diagnosis:  Onychomycosis, , Pain in right toe, pain in left toes  Treatment & Plan Procedures and Treatment: Consent by patient was obtained for treatment procedures. The patient understood the discussion of treatment and procedures well. All questions were answered thoroughly reviewed. Debridement of mycotic and hypertrophic toenails, 1 through 5 bilateral and clearing of subungual debris. No ulceration, no infection noted.   Return Visit-Office Procedure: Patient instructed to return to the office for a follow up visit 3 months for continued evaluation and treatment.  Liberty Seto DPM 

## 2018-06-02 ENCOUNTER — Other Ambulatory Visit: Payer: Self-pay | Admitting: Family

## 2018-06-02 MED ORDER — ALBUTEROL SULFATE HFA 108 (90 BASE) MCG/ACT IN AERS: INHALATION_SPRAY | RESPIRATORY_TRACT | 5 refills | Status: DC

## 2018-06-02 NOTE — Telephone Encounter (Signed)
Proair is on current med list. Rx sent.

## 2018-06-02 NOTE — Telephone Encounter (Signed)
Medication not on current med list. Pt requesting a new rx to be sent to local pharmacy.  Preferred Pharmacy (with phone number or street name): [facility] [place], [place]           [phone] (Phone) [phone] (Fax)  LOV: 04/28/18 with Melissa O'Sullivan,NP Next OV: 08/04/18

## 2018-06-02 NOTE — Telephone Encounter (Signed)
Copied from [place] [id number]. Topic: Quick Communication - Rx Refill/Question >> Jun 02, 2018 12:43 PM Margot Ables wrote: Medication: PROAIR HFA 108 (90 Base) MCG/ACT inhaler - pt is out Has the patient contacted their pharmacy? Yes - needing new RX/changing back to local pharmacy Preferred Pharmacy (with phone number or street name): [facility] [place], [place] [phone] (Phone) [phone] (Fax)

## 2018-06-11 ENCOUNTER — Telehealth: Payer: Self-pay | Admitting: *Deleted

## 2018-06-11 NOTE — Telephone Encounter (Signed)
Copy & Paste: Sales executive  (Newest Message First)  Me    05/13/18 1:21 PM  Note    Received Home Health Certification and Plan of Care; forwarded to provider/SLS 01/29      Copied from [place] #[id number]. Topic: Quick Communication - Home Health Verbal Orders >> May 12, 2018 10:42 AM Alanda Slim E wrote: Caller/Agency: Merilynn [place] - [facility]im health Callback Number: [phone]  Will be faxing in orders of all the orders she is missing/needs signed for Pt/ please look out for fax and advise. She will send to [phone] fax#

## 2018-06-18 ENCOUNTER — Other Ambulatory Visit: Payer: Self-pay | Admitting: Family

## 2018-06-24 ENCOUNTER — Telehealth: Payer: Self-pay | Admitting: *Deleted

## 2018-06-24 NOTE — Telephone Encounter (Signed)
Received Home Health Certification and Plan of Care; forwarded to provider/SLS 03/11 

## 2018-07-14 ENCOUNTER — Other Ambulatory Visit: Payer: Self-pay | Admitting: Family

## 2018-07-17 ENCOUNTER — Other Ambulatory Visit: Payer: Self-pay

## 2018-07-17 ENCOUNTER — Telehealth: Payer: Self-pay | Admitting: Family

## 2018-07-17 NOTE — Telephone Encounter (Signed)
RX CALLED IN TO THE PHARMACY.

## 2018-07-17 NOTE — Telephone Encounter (Signed)
Pt 's daughter called said that the rx for for her mother's test strips was is incorrect. She needs to have the Accu Check Guide test strips sent in to her pharmacy.

## 2018-07-21 ENCOUNTER — Ambulatory Visit: Payer: Medicare Other | Admitting: Endocrinology

## 2018-08-04 ENCOUNTER — Ambulatory Visit: Payer: Medicare Other | Admitting: Family

## 2018-08-25 ENCOUNTER — Other Ambulatory Visit: Payer: Self-pay

## 2018-08-25 ENCOUNTER — Ambulatory Visit (INDEPENDENT_AMBULATORY_CARE_PROVIDER_SITE_OTHER): Payer: Medicare Other | Admitting: Podiatry

## 2018-08-25 ENCOUNTER — Encounter: Payer: Self-pay | Admitting: Podiatry

## 2018-08-25 DIAGNOSIS — M79674 Pain in right toe(s): Secondary | ICD-10-CM

## 2018-08-25 DIAGNOSIS — B351 Tinea unguium: Secondary | ICD-10-CM | POA: Diagnosis not present

## 2018-08-25 DIAGNOSIS — M79675 Pain in left toe(s): Secondary | ICD-10-CM

## 2018-08-25 DIAGNOSIS — E119 Type 2 diabetes mellitus without complications: Secondary | ICD-10-CM

## 2018-08-25 NOTE — Progress Notes (Signed)
Patient ID: Angelica Benson, female   DOB: Oct 02, 1928, 85 y.o.   MRN: [id number] Complaint:  Visit Type: Patient returns to my office for continued preventative foot care services. Complaint: Patient states" my nails have grown long and thick and become painful to walk and wear shoes" Patient has been diagnosed with DM with neuropathy.. The patient presents for preventative foot care services. No changes to ROS  Podiatric Exam: Vascular: dorsalis pedis and posterior tibial pulses are palpable bilateral. Capillary return is immediate. Temperature gradient is WNL. Skin turgor WNL  Sensorium: Diminished  Semmes Weinstein monofilament test. Normal tactile sensation bilaterally. Nail Exam: Pt has thick disfigured discolored nails with subungual debris noted bilateral entire nail hallux through fifth toenails Ulcer Exam: There is no evidence of ulcer or pre-ulcerative changes or infection. Orthopedic Exam: Muscle tone and strength are WNL. No limitations in general ROM. No crepitus or effusions noted. Foot type and digits show no abnormalities. Bony prominences are unremarkable. Skin: No Porokeratosis. No infection or ulcers  Diagnosis:  Onychomycosis, , Pain in right toe, pain in left toes  Treatment & Plan Procedures and Treatment: Consent by patient was obtained for treatment procedures. The patient understood the discussion of treatment and procedures well. All questions were answered thoroughly reviewed. Debridement of mycotic and hypertrophic toenails, 1 through 5 bilateral and clearing of subungual debris. No ulceration, no infection noted.   Return Visit-Office Procedure: Patient instructed to return to the office for a follow up visit 3 months for continued evaluation and treatment.  Gardiner Barefoot DPM

## 2018-08-28 ENCOUNTER — Telehealth: Payer: Self-pay

## 2018-08-28 NOTE — Telephone Encounter (Signed)
I did not see this cream on her medication list, per daughter the patient's home health aid uses on patient after bath and patient is running out.

## 2018-08-28 NOTE — Telephone Encounter (Signed)
Copied from [place] [id number]. Topic: General - Inquiry >> Aug 28, 2018 11:23 AM Richardo Priest, NT wrote: Reason for CRM: Patient daughter is calling in stating that the triamcolone cream 5% has not been refilled. States she does not see it on her prescription list, and is wondering if it is not, if it can be prescribed to help with skin. Call back is [phone].

## 2018-08-31 MED ORDER — TRIAMCINOLONE ACETONIDE 0.5 % EX CREA: 1.0000 "application " | TOPICAL_CREAM | Freq: Every day | CUTANEOUS | 1 refills | Status: DC | PRN

## 2018-08-31 NOTE — Telephone Encounter (Signed)
Advised rx was sent this morning

## 2018-08-31 NOTE — Telephone Encounter (Signed)
Rx has been sent to [facility].

## 2018-09-14 ENCOUNTER — Encounter: Payer: Self-pay | Admitting: Endocrinology

## 2018-09-14 ENCOUNTER — Ambulatory Visit (INDEPENDENT_AMBULATORY_CARE_PROVIDER_SITE_OTHER): Payer: Medicare Other | Admitting: Endocrinology

## 2018-09-14 ENCOUNTER — Other Ambulatory Visit: Payer: Self-pay

## 2018-09-14 DIAGNOSIS — E119 Type 2 diabetes mellitus without complications: Secondary | ICD-10-CM

## 2018-09-14 NOTE — Progress Notes (Signed)
Patient ID: Angelica Benson, female   DOB: 1928/10/19, 85 y.o.   MRN: [id number]   Today's office visit was provided via telemedicine using video technique Explained to the patient and the the limitations of evaluation and management by telemedicine and the availability of in person appointments.  The patient understood the limitations and agreed to proceed. Patient also understood that the telehealth visit is billable. . Location of the patient: Home . Location of the provider: Office Only the patient and myself were participating in the encounter    Reason for Appointment : Followup of diabetes and other issues  History of Present Illness          Diagnosis: Type 2 diabetes mellitus, date of diagnosis: 1992       Past history: She is unclear about the date of her diagnosis and treatment history. At onset she had symptoms of frequent urination, weakness and sleepiness. She has been on various oral hypoglycemic drugs since her diagnosis. Has been on metformin since at least 2006 and Januvia since 2007. Also previously had taken glyburide and Avandia Her A1c has generally been in the 5.9-6.4 range previously  Recent history:   Oral hypoglycemic drugs the patient is taking are: Metformin 500 mg 2x a day   Her A1c on her last visit in 12/19 was 5.6   Current management:  She was started back on metformin in 2019 when her A1c had gone up to 6.9  She has not had any labs recently  She has a CNA who comes to check her blood sugars at times and most of the readings are reportedly fasting  Blood sugars are excellently in the mornings  Patient thinks that her appetite is reasonably good but not clear if she has gained or lost any weight  She tries to walk indoors in her apartment building but limited because of her balance  No side effects from metformin     Side effects from medications have been: None  Glucose monitoring:  Rarely.   Glucometer:  Accu-Chek guide      Blood Glucose readings from patient's daughter reading of the blood sugars from meter Fasting range 107-116    Self-care: The diet that the patient has been following is: tries to limit high-fat foods     Meals: 3 meals per day.  at breakfast has instant oatmeal and toast.  At lunch will have sandwich and fruit; dinner is chicken, starch and vegetables; her snacks are fruit, peanut butter or cheese crackers      Exercise:    will walk around her living quarters, uses a walker  Dietician visit: Most recent: 2000            Compliance with the medical regimen: Fairly good  Weight history:  Wt Readings from Last 3 Encounters:  04/28/18 185 lb (83.9 kg)  03/18/18 187 lb 3.2 oz (84.9 kg)  02/20/18 189 lb (85.7 kg)   Glycemic control:   Lab Results  Component Value Date   HGBA1C 5.6 03/18/2018   HGBA1C 6.9 (A) 12/11/2017   HGBA1C 5.5 08/06/2017   Lab Results  Component Value Date   MICROALBUR <0.7 04/04/2017   LDLCALC 36 12/11/2017   CREATININE 1.01 (H) 02/20/2018        Allergies as of 09/14/2018      Reactions   Buprenorphine Hcl Rash   Morphine And Related Rash      Medication List       Accurate as of  September 14, 2018  1:02 PM. If you have any questions, ask your nurse or doctor.        acetaminophen 500 MG tablet Commonly known as:  TYLENOL Take 2 tablets (1,000 mg total) by mouth 2 (two) times daily as needed for moderate pain.   albuterol 108 (90 Base) MCG/ACT inhaler Commonly known as:  ProAir HFA INHALE 2 PUFFS BY MOUTH FOUR TIMES A DAY AS NEEDED   aspirin EC 81 MG tablet Take 81 mg by mouth daily.   atorvastatin 10 MG tablet Commonly known as:  LIPITOR TAKE 1 TABLET BY MOUTH DAILY   Centrum Silver 50+Women Tabs Take 1 tablet by mouth daily.   diclofenac sodium 1 % Gel Commonly known as:  VOLTAREN Apply 4 g topically 4 (four) times daily. What changed:  when to take this   Dulera 200-5 MCG/ACT Aero Generic drug:  mometasone-formoterol  INHALE ONE PUFF INTO LUNGS EVERY 12 HOURS   fluticasone 50 MCG/ACT nasal spray Commonly known as:  FLONASE INSTILL 2 SPRAYS IN EACH NOSTRIL DAILY   gabapentin 100 MG capsule Commonly known as:  NEURONTIN TAKE 1 CAPSULE BY MOUTH TWICE DAILY   glucose blood test strip Commonly known as:  Accu-Chek Aviva Use as instructed   metFORMIN 500 MG tablet Commonly known as:  GLUCOPHAGE Take 1 tablet (500 mg total) by mouth 2 (two) times daily with a meal.   nitroGLYCERIN 0.4 MG SL tablet Commonly known as:  NITROSTAT Place 1 tablet (0.4 mg total) under the tongue every 5 (five) minutes as needed for chest pain.   omeprazole 20 MG capsule Commonly known as:  PRILOSEC TAKE 1 CAPSULE BY MOUTH EVERY MORNING   ONE TOUCH ULTRA 2 w/Device Kit Use to check blood sugar daily dx code 956.38   OneTouch Delica Lancets 75I Misc USE AS DIRECTED TO CHECK BLOOD SUGAR EVERY DAY Dx code E11.9   potassium chloride SA 20 MEQ tablet Commonly known as:  K-DUR TAKE 1 TABLET BY MOUTH EVERY DAY WITH FOOD   triamcinolone cream 0.5 % Commonly known as:  KENALOG Apply 1 application topically daily as needed.   Vitamin D (Ergocalciferol) 1.25 MG (50000 UT) Caps capsule Commonly known as:  DRISDOL TAKE 1 CAPSULE BY MOUTH ONCE WEEKLY   Vitamin D3 75 MCG (3000 UT) Tabs Take 1 tablet by mouth daily.       Allergies:  Allergies  Allergen Reactions  . Buprenorphine Hcl Rash  . Morphine And Related Rash    Past Medical History:  Diagnosis Date  . Arthritis   . Asthma   . CHF (congestive heart failure) ([place])   . Chronic headaches   . Constipation   . Diabetes mellitus without complication ([place])   . Hemorrhoids without complication   . Hypertension     Past Surgical History:  Procedure Laterality Date  . ABDOMINAL HYSTERECTOMY    . BACK SURGERY    . CHOLECYSTECTOMY    . KNEE SURGERY      Family History  Problem Relation Age of Onset  . Diabetes Son   . Hypertension Son   . Cancer  Sister        breast  . Diabetes Daughter        plus 2 grandkids  . Stroke Son        42moage dx, has enlarged heart  . Heart disease Son        passed away    Social History:  reports that she has never  smoked. She has never used smokeless tobacco. She reports that she does not drink alcohol or use drugs.    Review of Systems         Lipids: Has not been on treatment with a statin drug, levels are normal without statin drugs  Lab Results  Component Value Date   CHOL 110 12/11/2017   HDL 61.90 12/11/2017   LDLCALC 36 12/11/2017   TRIG 65.0 12/11/2017   CHOLHDL 2 12/11/2017       The blood pressure has been controlled with HCTZ, followed by PCP    History of anemia, she sees her PCP regularly:  Lab Results  Component Value Date   WBC 7.7 02/20/2018   HGB 11.3 (L) 02/20/2018   HCT 37.4 02/20/2018   MCV 93.7 02/20/2018   PLT 142 (L) 02/20/2018     NEUROPATHY: On 100 mg gabapentin twice daily from PCP   Diabetic foot exam in 5/20 by podiatrist   She has a history of a small goiter in the past, euthyroid  Lab Results  Component Value Date   TSH 1.94 12/11/2017    Physical Examination:  There were no vitals taken for this visit.     ASSESSMENT/ PLAN:   Diabetes type 2 with obesity, relatively mild and well controlled  See history of present illness for  discussion of current diabetes management, blood sugar patterns and problems identified  She appears to have fairly good control now considering her age A1c 6.3  Did not bring her monitor for download and she does not remember her readings She had taken metformin safely before and since her renal function is normal she can take at least 1000 mg a day, discussed that 500 mg once a day is not very effective  Her daughter is going to be involved in her care now including meal planning and medications Discussed checking blood sugars at different times by rotation, no more than one today  There are no  Patient Instructions on file for this visit.   Elayne Snare 09/14/2018, 1:02 PM            Patient ID: Angelica Benson, female   DOB: 06-Feb-1929, 85 y.o.   MRN: [id number]   Reason for Appointment : Followup of diabetes and other issues  History of Present Illness          Diagnosis: Type 2 diabetes mellitus, date of diagnosis: 1992       Past history: She is unclear about the date of her diagnosis and treatment history. At onset she had symptoms of frequent urination, weakness and sleepiness. She has been on various oral hypoglycemic drugs since her diagnosis. Has been on metformin since at least 2006 and Januvia since 2007. Also previously had taken glyburide and Avandia Her A1c has generally been in the 5.9-6.4 range previously  Recent history:   She is on Januvia and her metformin was supposed to be increase back to twice a day but she is still taking it only once a day  She has had fairly consistent control, A1c 6.3  She was given a new glucose monitor on the last visit and she was probably getting falsely high readings previously Did not bring her monitor for download today She is not having any side effects from metformin but only taking it in the morning Her weight has leveled off  Oral hypoglycemic drugs the patient is taking are: Metformin 500 mg once a day, Januvia 136m      Side  effects from medications have been: None  Glucose monitoring:  done once a day or less.   Glucometer: One Touch.       Blood Glucose readings not available today    Hypoglycemia:   none.   Self-care: The diet that the patient has been following is: tries to limit high-fat foods    Meals: 3 meals per day.  at breakfast has oatmeal and toast. At lunch will have sandwich and fruit; dinner is chicken, starch and vegetables; her snacks are fruit, peanut butter or cheese crackers      Exercise:    will walk around her living area Dietician visit: Most recent: 2000            Compliance with  the medical regimen: Good  Weight history:  Wt Readings from Last 3 Encounters:  04/28/18 185 lb (83.9 kg)  03/18/18 187 lb 3.2 oz (84.9 kg)  02/20/18 189 lb (85.7 kg)   Glycemic control:   Lab Results  Component Value Date   HGBA1C 5.6 03/18/2018   HGBA1C 6.9 (A) 12/11/2017   HGBA1C 5.5 08/06/2017   Lab Results  Component Value Date   MICROALBUR <0.7 04/04/2017   LDLCALC 36 12/11/2017   CREATININE 1.01 (H) 02/20/2018      No visits with results within 1 Week(s) from this visit.  Latest known visit with results is:  Lab on 05/14/2018  Component Date Value Ref Range Status  . Vitamin D 1, 25 (OH)2 Total 05/14/2018 20  18 - 72 pg/mL Final  . Vitamin D3 1, 25 (OH)2 05/14/2018 <8  pg/mL Final  . Vitamin D2 1, 25 (OH)2 05/14/2018 20  pg/mL Final   Comment: Marland Kitchen Vitamin D3, 1,25(OH)2 indicates both endogenous production and supplementation. Vitamin D2, 1,25(OH)2 is an indicator of exogenous sources, such as diet or supplementation.  Interpretation and therapy are based on measurement of Vitamin D,1,25(OH)2, Total. . . This test was developed and its analytical performance characteristics have been determined by [facility], [place], [place]. It has not been cleared or approved by the FDA. This assay has been validated pursuant to the CLIA regulations and is used for clinical purposes. .     Allergies as of 09/14/2018      Reactions   Buprenorphine Hcl Rash   Morphine And Related Rash      Medication List       Accurate as of September 14, 2018  1:02 PM. If you have any questions, ask your nurse or doctor.        acetaminophen 500 MG tablet Commonly known as:  TYLENOL Take 2 tablets (1,000 mg total) by mouth 2 (two) times daily as needed for moderate pain.   albuterol 108 (90 Base) MCG/ACT inhaler Commonly known as:  ProAir HFA INHALE 2 PUFFS BY MOUTH FOUR TIMES A DAY AS NEEDED   aspirin EC 81 MG tablet Take 81 mg by mouth daily.    atorvastatin 10 MG tablet Commonly known as:  LIPITOR TAKE 1 TABLET BY MOUTH DAILY   Centrum Silver 50+Women Tabs Take 1 tablet by mouth daily.   diclofenac sodium 1 % Gel Commonly known as:  VOLTAREN Apply 4 g topically 4 (four) times daily. What changed:  when to take this   Dulera 200-5 MCG/ACT Aero Generic drug:  mometasone-formoterol INHALE ONE PUFF INTO LUNGS EVERY 12 HOURS   fluticasone 50 MCG/ACT nasal spray Commonly known as:  FLONASE INSTILL 2 SPRAYS IN EACH NOSTRIL DAILY   gabapentin 100  MG capsule Commonly known as:  NEURONTIN TAKE 1 CAPSULE BY MOUTH TWICE DAILY   glucose blood test strip Commonly known as:  Accu-Chek Aviva Use as instructed   metFORMIN 500 MG tablet Commonly known as:  GLUCOPHAGE Take 1 tablet (500 mg total) by mouth 2 (two) times daily with a meal.   nitroGLYCERIN 0.4 MG SL tablet Commonly known as:  NITROSTAT Place 1 tablet (0.4 mg total) under the tongue every 5 (five) minutes as needed for chest pain.   omeprazole 20 MG capsule Commonly known as:  PRILOSEC TAKE 1 CAPSULE BY MOUTH EVERY MORNING   ONE TOUCH ULTRA 2 w/Device Kit Use to check blood sugar daily dx code 003.70   OneTouch Delica Lancets 48G Misc USE AS DIRECTED TO CHECK BLOOD SUGAR EVERY DAY Dx code E11.9   potassium chloride SA 20 MEQ tablet Commonly known as:  K-DUR TAKE 1 TABLET BY MOUTH EVERY DAY WITH FOOD   triamcinolone cream 0.5 % Commonly known as:  KENALOG Apply 1 application topically daily as needed.   Vitamin D (Ergocalciferol) 1.25 MG (50000 UT) Caps capsule Commonly known as:  DRISDOL TAKE 1 CAPSULE BY MOUTH ONCE WEEKLY   Vitamin D3 75 MCG (3000 UT) Tabs Take 1 tablet by mouth daily.       Allergies:  Allergies  Allergen Reactions  . Buprenorphine Hcl Rash  . Morphine And Related Rash    Past Medical History:  Diagnosis Date  . Arthritis   . Asthma   . CHF (congestive heart failure) ([place])   . Chronic headaches   . Constipation   .  Diabetes mellitus without complication ([place])   . Hemorrhoids without complication   . Hypertension     Past Surgical History:  Procedure Laterality Date  . ABDOMINAL HYSTERECTOMY    . BACK SURGERY    . CHOLECYSTECTOMY    . KNEE SURGERY      Family History  Problem Relation Age of Onset  . Diabetes Son   . Hypertension Son   . Cancer Sister        breast  . Diabetes Daughter        plus 2 grandkids  . Stroke Son        59moage dx, has enlarged heart  . Heart disease Son        passed away    Social History:  reports that she has never smoked. She has never used smokeless tobacco. She reports that she does not drink alcohol or use drugs.    Review of Systems        Lipids: She has been on Lipitor 10 mg since April 2019 prescribed by her PCP   Lab Results  Component Value Date   CHOL 110 12/11/2017   HDL 61.90 12/11/2017   LDLCALC 36 12/11/2017   TRIG 65.0 12/11/2017   CHOLHDL 2 12/11/2017    HYPERTENSION: Not on treatment, no recent blood pressure available  BP Readings from Last 3 Encounters:  04/28/18 (!) 152/72  03/18/18 (!) 142/68  02/20/18 (!) 138/53      Lab Results  Component Value Date   CREATININE 1.01 (H) 02/20/2018   BUN 20 02/20/2018   NA 142 02/20/2018   K 4.3 02/20/2018   CL 110 02/20/2018   CO2 19 (L) 02/20/2018        NEUROPATHY: On 100 mg gabapentin long-term for treatment of tingling and aching in her legs  Diabetic foot exam in 08/2018 by podiatrist  She has a history of a small goiter, with normal TSH  Lab Results  Component Value Date   TSH 1.94 12/11/2017    Physical Examination:  There were no vitals taken for this visit.    ASSESSMENT/ PLAN:   Diabetes type 2 with obesity, mild and usually well controlled   See history of present illness for  discussion of current diabetes management, blood sugar patterns and problems identified  A1c was last 5.6  She is on metformin 500 mg twice daily which she is  tolerating Fasting readings are mostly just above 100 Not monitoring after meals Also no labs available recently  She is subjectively doing well, appetite is stable and she is trying to be as active as possible We will continue metformin assuming that her renal function is continuing to be normal  HYPERTENSION: She needs to follow-up with PCP LIPIDS: Needs follow-up labs  Follow-up in 3 months for A1c  There are no Patient Instructions on file for this visit.   Elayne Snare 09/14/2018, 1:02 PM

## 2018-09-15 ENCOUNTER — Other Ambulatory Visit: Payer: Self-pay

## 2018-09-15 ENCOUNTER — Telehealth: Payer: Self-pay | Admitting: Family

## 2018-09-15 ENCOUNTER — Encounter (HOSPITAL_COMMUNITY): Payer: Self-pay

## 2018-09-15 ENCOUNTER — Emergency Department (HOSPITAL_COMMUNITY)
Admission: EM | Admit: 2018-09-15 | Discharge: 2018-09-15 | Disposition: A | Discharge status: Home / Self Care | Payer: Medicare Other | Attending: Emergency Medicine | Admitting: Emergency Medicine

## 2018-09-15 DIAGNOSIS — Z79899 Other long term (current) drug therapy: Secondary | ICD-10-CM | POA: Insufficient documentation

## 2018-09-15 DIAGNOSIS — Z7982 Long term (current) use of aspirin: Secondary | ICD-10-CM | POA: Insufficient documentation

## 2018-09-15 DIAGNOSIS — Z7984 Long term (current) use of oral hypoglycemic drugs: Secondary | ICD-10-CM | POA: Insufficient documentation

## 2018-09-15 DIAGNOSIS — I5032 Chronic diastolic (congestive) heart failure: Secondary | ICD-10-CM | POA: Diagnosis not present

## 2018-09-15 DIAGNOSIS — R42 Dizziness and giddiness: Secondary | ICD-10-CM | POA: Diagnosis not present

## 2018-09-15 DIAGNOSIS — I10 Essential (primary) hypertension: Secondary | ICD-10-CM

## 2018-09-15 DIAGNOSIS — I110 Hypertensive heart disease with heart failure: Secondary | ICD-10-CM | POA: Insufficient documentation

## 2018-09-15 DIAGNOSIS — J45909 Unspecified asthma, uncomplicated: Secondary | ICD-10-CM | POA: Insufficient documentation

## 2018-09-15 DIAGNOSIS — E119 Type 2 diabetes mellitus without complications: Secondary | ICD-10-CM | POA: Insufficient documentation

## 2018-09-15 DIAGNOSIS — R110 Nausea: Secondary | ICD-10-CM | POA: Diagnosis present

## 2018-09-15 LAB — BASIC METABOLIC PANEL
Anion gap: 8 (ref 5–15)
BUN: 15 mg/dL (ref 8–23)
CO2: 25 mmol/L (ref 22–32)
Calcium: 8.3 mg/dL — ABNORMAL LOW (ref 8.9–10.3)
Chloride: 110 mmol/L (ref 98–111)
Creatinine, Ser: 0.78 mg/dL (ref 0.44–1.00)
GFR calc Af Amer: >60 mL/min (ref >60)
GFR calc non Af Amer: >60 mL/min (ref >60)
Glucose, Bld: 101 mg/dL — ABNORMAL HIGH (ref 70–99)
Potassium: 4.3 mmol/L (ref 3.5–5.1)
Sodium: 143 mmol/L (ref 135–145)

## 2018-09-15 LAB — URINALYSIS, ROUTINE W REFLEX MICROSCOPIC
Bacteria, UA: NONE SEEN
Bilirubin Urine: NEGATIVE
Glucose, UA: NEGATIVE mg/dL
Hgb urine dipstick: NEGATIVE
Ketones, ur: 5 mg/dL — AB
Nitrite: NEGATIVE
Protein, ur: NEGATIVE mg/dL
Specific Gravity, Urine: 1.015 (ref 1.005–1.030)
pH: 6 (ref 5.0–8.0)

## 2018-09-15 LAB — CBG MONITORING, ED: Glucose-Capillary: 83 mg/dL (ref 70–99)

## 2018-09-15 LAB — CBC
HCT: 35 % — ABNORMAL LOW (ref 36.0–46.0)
Hemoglobin: 11 g/dL — ABNORMAL LOW (ref 12.0–15.0)
MCH: 29.7 pg (ref 26.0–34.0)
MCHC: 31.4 g/dL (ref 30.0–36.0)
MCV: 94.6 fL (ref 80.0–100.0)
Platelets: 166 10*3/uL (ref 150–400)
RBC: 3.7 MIL/uL — ABNORMAL LOW (ref 3.87–5.11)
RDW: 13.4 % (ref 11.5–15.5)
WBC: 5 10*3/uL (ref 4.0–10.5)
nRBC: 0 % (ref 0.0–0.2)

## 2018-09-15 LAB — TROPONIN I: Troponin I: <0.03 ng/mL (ref <0.03)

## 2018-09-15 MED ORDER — LISINOPRIL 5 MG PO TABS: 5.0000 mg | ORAL_TABLET | Freq: Every day | ORAL | 1 refills | Status: DC

## 2018-09-15 NOTE — ED Notes (Signed)
Patient ambulated to restroom using walker.

## 2018-09-15 NOTE — Discharge Instructions (Addendum)
1.  Start taking lisinopril daily for your blood pressure.  Continue monitoring and writing down a log for your doctor. 2.  Make an appointment for recheck within the next 3 to 5 days with your doctor.

## 2018-09-15 NOTE — ED Notes (Signed)
This writer attempted blood draw x2 unsuccessful

## 2018-09-15 NOTE — ED Notes (Signed)
Bed: IX65 Expected date:  Expected time:  Means of arrival:  Comments: EMS-diarrhea 85 y/o

## 2018-09-15 NOTE — Telephone Encounter (Signed)
She went to the ED. Please arrange a follow up with me virtually in 1-2 weeks.  Check blood pressure prior to visit if possible.

## 2018-09-15 NOTE — ED Provider Notes (Addendum)
[facility] DEPT Provider Note   CSN: [id number] Arrival date & time: 09/15/18  4098    History   Chief Complaint Chief Complaint  Patient presents with  . Nausea  . Dizziness    HPI Angelica Benson is a 85 y.o. female.     HPI Patient awakened this morning and had a spinning dizzy quality.  She reports she felt nauseated with it.  No headache.  No visual changes.  She reports no weakness numbness or tingling to extremities.  Patient reports that she lives alone and she was afraid that if she got up she would be off balance and fall.  She did however get up and use her walker to unlock the door so that when she called EMS they could come in to get her.  She reports she did not fall and was using her walker at baseline without weakness numbness or dysfunction of extremities.  Patient has not taken her morning medications.  She reports she is diabetic.  She has a home health aide who comes regularly.  Reports that she was leaving with EMS the aide was arriving.  Reports she has had similar dizziness in the past. Past Medical History:  Diagnosis Date  . Arthritis   . Asthma   . CHF (congestive heart failure) ([place])   . Chronic headaches   . Constipation   . Diabetes mellitus without complication ([place])   . Hemorrhoids without complication   . Hypertension     Patient Active Problem List   Diagnosis Date Noted  . Dizziness 10/02/2017  . Liver mass 10/02/2017  . Orthostatic hypotension 10/02/2017  . Left flank pain 10/02/2017  . Arthritis 02/19/2017  . Hypertension   . Diabetes mellitus without complication ([place])   . Chronic headaches   . CHF (congestive heart failure) ([place])   . Asthma   . Carpal tunnel syndrome 09/04/2015  . Bilateral sensorineural hearing loss 06/07/2015  . Cervical disc disorder with radiculopathy 06/07/2015  . Cervical disc disorder with radiculopathy of cervical region   . TIA (transient ischemic attack) 03/22/2015  .  Temporary cerebral vascular dysfunction 03/22/2015  . Obesity, unspecified 08/18/2013  . Goiter 08/18/2013  . Anemia, chronic disease 08/18/2013  . Anemia 06/14/2013  . Type 2 diabetes mellitus ([place]) 06/14/2013  . Chronic diastolic heart failure ([place]) 04/05/2010  . Benign essential hypertension 04/05/2010    Past Surgical History:  Procedure Laterality Date  . ABDOMINAL HYSTERECTOMY    . BACK SURGERY    . CHOLECYSTECTOMY    . KNEE SURGERY       OB History   No obstetric history on file.      Home Medications    Prior to Admission medications   Medication Sig Start Date End Date Taking? Authorizing Provider  acetaminophen (TYLENOL) 500 MG tablet Take 2 tablets (1,000 mg total) by mouth 2 (two) times daily as needed for moderate pain. 08/15/17   Debbrah Alar, NP  albuterol (PROAIR HFA) 108 (90 Base) MCG/ACT inhaler INHALE 2 PUFFS BY MOUTH FOUR TIMES A DAY AS NEEDED Patient taking differently: Inhale 2 puffs into the lungs 4 (four) times daily as needed for wheezing or shortness of breath. INHALE 2 PUFFS BY MOUTH FOUR TIMES A DAY AS NEEDED 06/02/18   Debbrah Alar, NP  aspirin EC 81 MG tablet Take 81 mg by mouth daily.    [provider]  atorvastatin (LIPITOR) 10 MG tablet TAKE 1 TABLET BY MOUTH DAILY Patient taking differently:  Take 10 mg by mouth daily at 6 PM.  07/15/18   Debbrah Alar, NP  Blood Glucose Monitoring Suppl (ONE TOUCH ULTRA 2) w/Device KIT Use to check blood sugar daily dx code 250.00 07/03/16   Debbrah Alar, NP  Cholecalciferol (VITAMIN D3) 75 MCG (3000 UT) TABS Take 1 tablet by mouth daily. 05/19/18   Debbrah Alar, NP  diclofenac sodium (VOLTAREN) 1 % GEL Apply 4 g topically 4 (four) times daily. Patient taking differently: Apply 4 g topically daily at 12 noon.  01/21/17   Forde Dandy, MD  DULERA 200-5 MCG/ACT AERO INHALE ONE PUFF INTO LUNGS EVERY 12 HOURS Patient taking differently: Inhale 1 puff into the lungs every 12  (twelve) hours.  08/13/17   Debbrah Alar, NP  fluticasone (FLONASE) 50 MCG/ACT nasal spray INSTILL 2 SPRAYS IN EACH NOSTRIL DAILY Patient taking differently: Place 2 sprays into both nostrils daily.  12/10/17   Debbrah Alar, NP  gabapentin (NEURONTIN) 100 MG capsule TAKE 1 CAPSULE BY MOUTH TWICE DAILY Patient taking differently: Take 100 mg by mouth 2 (two) times daily.  04/16/18   Debbrah Alar, NP  glucose blood (ACCU-CHEK AVIVA) test strip Use as instructed 07/23/17   Debbrah Alar, NP  lisinopril (ZESTRIL) 5 MG tablet Take 1 tablet (5 mg total) by mouth daily. 09/15/18   Charlesetta Shanks, MD  metFORMIN (GLUCOPHAGE) 500 MG tablet Take 1 tablet (500 mg total) by mouth 2 (two) times daily with a meal. 12/11/17   Elayne Snare, MD  Multiple Vitamins-Minerals (CENTRUM SILVER 50+WOMEN) TABS Take 1 tablet by mouth daily. 04/28/18   Debbrah Alar, NP  nitroGLYCERIN (NITROSTAT) 0.4 MG SL tablet Place 1 tablet (0.4 mg total) under the tongue every 5 (five) minutes as needed for chest pain. 10/28/17   Debbrah Alar, NP  omeprazole (PRILOSEC) 20 MG capsule TAKE 1 CAPSULE BY MOUTH EVERY MORNING Patient taking differently: Take 20 mg by mouth daily.  05/15/18   Debbrah Alar, NP  [facility] DELICA LANCETS 56D MISC USE AS DIRECTED TO CHECK BLOOD SUGAR EVERY DAY Dx code E11.9 01/15/17   Debbrah Alar, NP  potassium chloride SA (K-DUR,KLOR-CON) 20 MEQ tablet TAKE 1 TABLET BY MOUTH EVERY DAY WITH FOOD Patient taking differently: Take 20 mEq by mouth daily.  06/21/18   Debbrah Alar, NP  triamcinolone cream (KENALOG) 0.5 % Apply 1 application topically daily as needed. 08/31/18   Debbrah Alar, NP  Vitamin D, Ergocalciferol, (DRISDOL) 50000 units CAPS capsule TAKE 1 CAPSULE BY MOUTH ONCE WEEKLY Patient taking differently: Take 50,000 Units by mouth every 7 (seven) days.  12/10/17   Debbrah Alar, NP    Family History Family History  Problem Relation Age of Onset   . Diabetes Son   . Hypertension Son   . Cancer Sister        breast  . Diabetes Daughter        plus 2 grandkids  . Stroke Son        74moage dx, has enlarged heart  . Heart disease Son        passed away    Social History Social History   Tobacco Use  . Smoking status: Never Smoker  . Smokeless tobacco: Never Used  Substance Use Topics  . Alcohol use: No  . Drug use: No     Allergies   Buprenorphine hcl and Morphine and related   Review of Systems Review of Systems 10 Systems reviewed and are negative for acute change except as noted in  the HPI.   Physical Exam Updated Vital Signs BP (!) 184/59   Pulse 64   Temp 97.7 F (36.5 C)   Resp 15   Wt 83.9 kg   SpO2 100%   BMI 36.12 kg/m   Physical Exam Constitutional:      Comments: Patient is alert and well in appearance.  Interactive pleasant no distress.  HENT:     Head: Normocephalic and atraumatic.     Nose: Nose normal.     Mouth/Throat:     Mouth: Mucous membranes are moist.     Pharynx: Oropharynx is clear.  Eyes:     Extraocular Movements: Extraocular movements intact.     Conjunctiva/sclera: Conjunctivae normal.     Pupils: Pupils are equal, round, and reactive to light.  Neck:     Musculoskeletal: Neck supple.  Cardiovascular:     Comments: Heart is grossly regular.  Occasional ectopic beat.  2\6 systolic ejection murmur. Pulmonary:     Comments: Lungs are clear.  No wheeze no rhonchi. Abdominal:     General: There is no distension.     Palpations: Abdomen is soft.     Tenderness: There is no abdominal tenderness. There is no guarding.  Musculoskeletal:     Comments: No peripheral edema.  Calves are soft and nontender.  Skin of lower legs and feet is in excellent condition.  Skin:    General: Skin is warm and dry.  Neurological:     Comments: Cognitive function normal.  Speech is clear.  Content of speech normal.  5\5 grip strength bilaterally.  Normal dorsiflexion of the feet.  Patient  can elevate and hold each lower extremity off of the bed independently.  Cranial nerves intact.  No nystagmus.  Psychiatric:        Mood and Affect: Mood normal.      ED Treatments / Results  Labs (all labs ordered are listed, but only abnormal results are displayed) Labs Reviewed  BASIC METABOLIC PANEL - Abnormal; Notable for the following components:      Result Value   Glucose, Bld 101 (*)    Calcium 8.3 (*)    All other components within normal limits  CBC - Abnormal; Notable for the following components:   RBC 3.70 (*)    Hemoglobin 11.0 (*)    HCT 35.0 (*)    All other components within normal limits  URINALYSIS, ROUTINE W REFLEX MICROSCOPIC - Abnormal; Notable for the following components:   APPearance HAZY (*)    Ketones, ur 5 (*)    Leukocytes,Ua MODERATE (*)    All other components within normal limits  URINE CULTURE  TROPONIN I  CBG MONITORING, ED    EKG EKG Interpretation  Date/Time:  Tuesday September 15 2018 08:58:05 EDT Ventricular Rate:  59 PR Interval:    QRS Duration: 156 QT Interval:  447 QTC Calculation: 443 R Axis:   -67 Text Interpretation:  Sinus or ectopic atrial rhythm Prolonged PR interval RBBB and LAFB Baseline wander in lead(s) II III aVF no change from previous Confirmed by Charlesetta Shanks [phone]) on 09/15/2018 9:59:17 AM Also confirmed by Charlesetta Shanks [phone]), editor Philomena Doheny [phone])  on 09/15/2018 10:32:32 AM   Radiology No results found.  Procedures Procedures (including critical care time)  Medications Ordered in ED Medications - No data to display   Initial Impression / Assessment and Plan / ED Course  I have reviewed the triage vital signs and the nursing notes.  Pertinent labs &  imaging results that were available during my care of the patient were reviewed by me and considered in my medical decision making (see chart for details).       Have called the patient's daughter and updated her on the patient being in the  emergency department and her general appearance.  [name] reports that her doctor is monitoring her blood pressures but does not currently have her on a blood pressure medication.  Patient is clinically well in appearance.  She had vertigo this morning.  She had no associated neurologic symptoms.  Was ambulatory at baseline with her walker per her description.  No headache or visual changes.  He has been asymptomatic in the emergency department.  Diagnostic evaluation is within normal limits.  We will plan to discharge for follow-up on outpatient basis for vertigo.  Patient has been monitored on outpatient basis for hypertension.  She consistently has systolic and diastolic hypertension up to 496 systolic.  Will initiate low-dose lisinopril with instructions to follow-up with PCP.  Final Clinical Impressions(s) / ED Diagnoses   Final diagnoses:  Vertigo  Essential hypertension    ED Discharge Orders         Ordered    lisinopril (ZESTRIL) 5 MG tablet  Daily     09/15/18 1413           Charlesetta Shanks, MD 09/15/18 1408    Charlesetta Shanks, MD 09/15/18 1416

## 2018-09-15 NOTE — Telephone Encounter (Signed)
LVM for pt to call the office and schedule VOV for flim in throat with any provider available today.

## 2018-09-15 NOTE — ED Triage Notes (Signed)
Pt c/o nausea and feeling light-headed for 2 days. Pt denies any vomiting or syncopal episodes

## 2018-09-15 NOTE — ED Notes (Signed)
Purewick placed on patient and Patient given meal tray

## 2018-09-16 LAB — URINE CULTURE

## 2018-09-22 ENCOUNTER — Encounter: Payer: Self-pay | Admitting: Family

## 2018-09-22 ENCOUNTER — Other Ambulatory Visit: Payer: Self-pay

## 2018-09-22 ENCOUNTER — Ambulatory Visit (INDEPENDENT_AMBULATORY_CARE_PROVIDER_SITE_OTHER): Payer: Medicare Other | Admitting: Family

## 2018-09-22 DIAGNOSIS — N3000 Acute cystitis without hematuria: Secondary | ICD-10-CM | POA: Diagnosis not present

## 2018-09-22 DIAGNOSIS — I10 Essential (primary) hypertension: Secondary | ICD-10-CM | POA: Diagnosis not present

## 2018-09-22 DIAGNOSIS — J302 Other seasonal allergic rhinitis: Secondary | ICD-10-CM

## 2018-09-22 MED ORDER — LORATADINE 10 MG PO TABS: 10.0000 mg | ORAL_TABLET | Freq: Every day | ORAL | 11 refills | Status: DC

## 2018-09-22 MED ORDER — CEPHALEXIN 500 MG PO CAPS: 500.0000 mg | ORAL_CAPSULE | Freq: Two times a day (BID) | ORAL | 0 refills | Status: DC

## 2018-09-22 NOTE — Progress Notes (Signed)
Virtual Visit via Video Note  I connected with Jennfier Beasly on 09/22/18 at  1:40 PM EDT by a video enabled telemedicine application and verified that I am speaking with the correct person using two identifiers. This visit type was conducted due to national recommendations for restrictions regarding the COVID-19 Pandemic (e.g. social distancing).  This format is felt to be most appropriate for this patient at this time.   I discussed the limitations of evaluation and management by telemedicine and the availability of in person appointments. The patient expressed understanding and agreed to proceed.  The patient, her daughter Vita Barley and myself were on today's video visit. The patient was at home and I was in my office at the time of today's visit.   History of Present Illness:  Patient is an 85 yr old female who presents today for ED follow up on 09/15/18. ED record is reviewed.  She initially awakened on 09/15/18 with dizziness and nausea. She called EMS who transported her to the ED.  ER work up included a polymicrobial UTI with dirty UA. She was not treated for a UTI.  Denies dysuria/frequency. Since returning home she denies any dizziness. Reports she is feeling much better. BP was noted to be elevated in the ED. The ER provider started her on lisinopril 62m once daily.  Troponin was negative.   Reports that she has had some white mucous in her throat. She denies cough. Denies allergies or fever. Though notes some sinus congestion. She denies any further dizziness or nausea. Daughter has recently purchased a new blood pressure cuff for the patient.    BP Readings from Last 3 Encounters:  09/15/18 (!) 128/55  04/28/18 (!) 152/72  03/18/18 (!) 142/68   Past Medical History:  Diagnosis Date  . Arthritis   . Asthma   . CHF ([place]estive heart failure) ([place]   . Chronic headaches   . Constipation   . Diabetes mellitus without complication ([place]   . Hemorrhoids without complication   .  Hypertension      Social History   Socioeconomic History  . Marital status: Single    Spouse name: Not on file  . Number of children: Not on file  . Years of education: Not on file  . Highest education level: Not on file  Occupational History  . Not on file  Social Needs  . Financial resource strain: Not on file  . Food insecurity:    Worry: Not on file    Inability: Not on file  . Transportation needs:    Medical: Not on file    Non-medical: Not on file  Tobacco Use  . Smoking status: Never Smoker  . Smokeless tobacco: Never Used  Substance and Sexual Activity  . Alcohol use: No  . Drug use: No  . Sexual activity: Not Currently  Lifestyle  . Physical activity:    Days per week: Not on file    Minutes per session: Not on file  . Stress: Not on file  Relationships  . Social connections:    Talks on phone: Not on file    Gets together: Not on file    Attends religious service: Not on file    Active member of club or organization: Not on file    Attends meetings of clubs or organizations: Not on file    Relationship status: Not on file  . Intimate partner violence:    Fear of current or ex partner: Not on file    Emotionally  abused: Not on file    Physically abused: Not on file    Forced sexual activity: Not on file  Other Topics Concern  . Not on file  Social History Narrative   Lives at home by herself, aide in the am.     Has 4 daughters and 3 living sons (one son died).    Uses walker.     Divorced at age 78    Past Surgical History:  Procedure Laterality Date  . ABDOMINAL HYSTERECTOMY    . BACK SURGERY    . CHOLECYSTECTOMY    . KNEE SURGERY      Family History  Problem Relation Age of Onset  . Diabetes Son   . Hypertension Son   . Cancer Sister        breast  . Diabetes Daughter        plus 2 grandkids  . Stroke Son        50moage dx, has enlarged heart  . Heart disease Son        passed away    Allergies  Allergen Reactions  .  Buprenorphine Hcl Rash  . Morphine And Related Rash    Current Outpatient Medications on File Prior to Visit  Medication Sig Dispense Refill  . acetaminophen (TYLENOL) 500 MG tablet Take 2 tablets (1,000 mg total) by mouth 2 (two) times daily as needed for moderate pain. 30 tablet 0  . albuterol (PROAIR HFA) 108 (90 Base) MCG/ACT inhaler INHALE 2 PUFFS BY MOUTH FOUR TIMES A DAY AS NEEDED (Patient taking differently: Inhale 2 puffs into the lungs 4 (four) times daily as needed for wheezing or shortness of breath. INHALE 2 PUFFS BY MOUTH FOUR TIMES A DAY AS NEEDED) 18 g 5  . aspirin EC 81 MG tablet Take 81 mg by mouth daily.    .Marland Kitchenatorvastatin (LIPITOR) 10 MG tablet TAKE 1 TABLET BY MOUTH DAILY (Patient taking differently: Take 10 mg by mouth daily at 6 PM. ) 90 tablet 1  . Blood Glucose Monitoring Suppl (ONE TOUCH ULTRA 2) w/Device KIT Use to check blood sugar daily dx code 250.00 1 each 0  . Cholecalciferol (VITAMIN D3) 75 MCG (3000 UT) TABS Take 1 tablet by mouth daily. 30 tablet   . diclofenac sodium (VOLTAREN) 1 % GEL Apply 4 g topically 4 (four) times daily. (Patient taking differently: Apply 4 g topically daily at 12 noon. ) 100 g 0  . DULERA 200-5 MCG/ACT AERO INHALE ONE PUFF INTO LUNGS EVERY 12 HOURS (Patient taking differently: Inhale 1 puff into the lungs every 12 (twelve) hours. ) 1 Inhaler 5  . fluticasone (FLONASE) 50 MCG/ACT nasal spray INSTILL 2 SPRAYS IN EACH NOSTRIL DAILY (Patient taking differently: Place 2 sprays into both nostrils daily. ) 16 g 2  . gabapentin (NEURONTIN) 100 MG capsule TAKE 1 CAPSULE BY MOUTH TWICE DAILY (Patient taking differently: Take 100 mg by mouth as needed. ) 180 capsule 1  . glucose blood (ACCU-CHEK AVIVA) test strip Use as instructed 100 each 12  . lisinopril (ZESTRIL) 5 MG tablet Take 1 tablet (5 mg total) by mouth daily. 30 tablet 1  . metFORMIN (GLUCOPHAGE) 500 MG tablet Take 1 tablet (500 mg total) by mouth 2 (two) times daily with a meal. 180 tablet  3  . Multiple Vitamins-Minerals (CENTRUM SILVER 50+WOMEN) TABS Take 1 tablet by mouth daily.    . nitroGLYCERIN (NITROSTAT) 0.4 MG SL tablet Place 1 tablet (0.4 mg total) under the tongue every  5 (five) minutes as needed for chest pain. 20 tablet 1  . omeprazole (PRILOSEC) 20 MG capsule TAKE 1 CAPSULE BY MOUTH EVERY MORNING (Patient taking differently: Take 20 mg by mouth daily. ) 30 capsule 5  . ONETOUCH DELICA LANCETS 18Z MISC USE AS DIRECTED TO CHECK BLOOD SUGAR EVERY DAY Dx code E11.9 100 each 1  . potassium chloride SA (K-DUR,KLOR-CON) 20 MEQ tablet TAKE 1 TABLET BY MOUTH EVERY DAY WITH FOOD (Patient taking differently: Take 20 mEq by mouth daily. ) 30 tablet 5  . triamcinolone cream (KENALOG) 0.5 % Apply 1 application topically daily as needed. 30 g 1  . Vitamin D, Ergocalciferol, (DRISDOL) 50000 units CAPS capsule TAKE 1 CAPSULE BY MOUTH ONCE WEEKLY (Patient taking differently: Take 50,000 Units by mouth every 7 (seven) days. ) 4 capsule 5   No current facility-administered medications on file prior to visit.     There were no vitals taken for this visit.   Observations/Objective:   Gen: Awake, alert, no acute distress Resp: Breathing is even and non-labored Psych: calm/pleasant demeanor Neuro: Alert and Oriented x 3, + facial symmetry, speech is clear.   Assessment and Plan:  HTN- clinically stable.  Advised daughter to check pt's bp once daily for the next week and then contact me with her readings. I would also like to get a follow up bmet in the next few weeks to assess her potassium after starting lisinopril since she is on a potassium supplement.  UTI-will rx with keflex.  Seasonal allergies- I suspect sinus congestion and mucous in her throat are due to allergies. Suggested a trial for claritin. She is advised to call if she develops cough, fever, weakness or if mucous does not improve with the use of claritin.   Follow Up Instructions:    I discussed the assessment  and treatment plan with the patient. The patient was provided an opportunity to ask questions and all were answered. The patient agreed with the plan and demonstrated an understanding of the instructions.   The patient was advised to call back or seek an in-person evaluation if the symptoms worsen or if the condition fails to improve as anticipated.    Nance Pear, NP

## 2018-09-26 ENCOUNTER — Encounter: Payer: Self-pay | Admitting: Family

## 2018-09-28 MED ORDER — ACCU-CHEK FASTCLIX LANCETS MISC: 3 refills | Status: DC

## 2018-10-15 ENCOUNTER — Telehealth: Payer: Self-pay | Admitting: Family

## 2018-10-15 ENCOUNTER — Other Ambulatory Visit: Payer: Self-pay

## 2018-10-15 ENCOUNTER — Other Ambulatory Visit (INDEPENDENT_AMBULATORY_CARE_PROVIDER_SITE_OTHER): Payer: Medicare Other

## 2018-10-15 DIAGNOSIS — I10 Essential (primary) hypertension: Secondary | ICD-10-CM | POA: Diagnosis not present

## 2018-10-15 DIAGNOSIS — D649 Anemia, unspecified: Secondary | ICD-10-CM

## 2018-10-15 DIAGNOSIS — E785 Hyperlipidemia, unspecified: Secondary | ICD-10-CM | POA: Diagnosis not present

## 2018-10-15 DIAGNOSIS — E119 Type 2 diabetes mellitus without complications: Secondary | ICD-10-CM | POA: Diagnosis not present

## 2018-10-15 LAB — COMPREHENSIVE METABOLIC PANEL
ALT: 6 U/L (ref 0–35)
AST: 14 U/L (ref 0–37)
Albumin: 3.7 g/dL (ref 3.5–5.2)
Alkaline Phosphatase: 63 U/L (ref 39–117)
BUN: 20 mg/dL (ref 6–23)
CO2: 29 mEq/L (ref 19–32)
Calcium: 9.4 mg/dL (ref 8.4–10.5)
Chloride: 102 mEq/L (ref 96–112)
Creatinine, Ser: 1.05 mg/dL (ref 0.40–1.20)
GFR: 59.83 mL/min — ABNORMAL LOW (ref >60.00)
Glucose, Bld: 104 mg/dL — ABNORMAL HIGH (ref 70–99)
Potassium: 4.3 mEq/L (ref 3.5–5.1)
Sodium: 139 mEq/L (ref 135–145)
Total Bilirubin: 0.5 mg/dL (ref 0.2–1.2)
Total Protein: 6.4 g/dL (ref 6.0–8.3)

## 2018-10-15 LAB — CBC
HCT: 32.9 % — ABNORMAL LOW (ref 36.0–46.0)
Hemoglobin: 10.9 g/dL — ABNORMAL LOW (ref 12.0–15.0)
MCHC: 33.2 g/dL (ref 30.0–36.0)
MCV: 90.7 fl (ref 78.0–100.0)
Platelets: 196 10*3/uL (ref 150.0–400.0)
RBC: 3.63 Mil/uL — ABNORMAL LOW (ref 3.87–5.11)
RDW: 15 % (ref 11.5–15.5)
WBC: 4.3 10*3/uL (ref 4.0–10.5)

## 2018-10-15 LAB — LIPID PANEL
Cholesterol: 100 mg/dL (ref 0–200)
HDL: 56.6 mg/dL (ref >39.00)
LDL Cholesterol: 30 mg/dL (ref 0–99)
NonHDL: 43.8
Total CHOL/HDL Ratio: 2
Triglycerides: 71 mg/dL (ref 0.0–149.0)
VLDL: 14.2 mg/dL (ref 0.0–40.0)

## 2018-10-15 LAB — HEMOGLOBIN A1C: Hgb A1c MFr Bld: 6.4 % (ref 4.6–6.5)

## 2018-10-15 LAB — TSH: TSH: 2.22 u[IU]/mL (ref 0.35–4.50)

## 2018-10-15 NOTE — Progress Notes (Signed)
l °

## 2018-10-15 NOTE — Telephone Encounter (Signed)
Pt's daughter Angelica Benson stated that pt will be using [facility] for prescriptions and needs Melissa to call or fax rx's over to them to complete process. Please advise. Pharmacist line: [phone] FAX#: [phone]

## 2018-10-19 ENCOUNTER — Other Ambulatory Visit: Payer: Self-pay

## 2018-10-19 DIAGNOSIS — M792 Neuralgia and neuritis, unspecified: Secondary | ICD-10-CM

## 2018-10-19 MED ORDER — LISINOPRIL 5 MG PO TABS: 5.0000 mg | ORAL_TABLET | Freq: Every day | ORAL | 1 refills | Status: DC

## 2018-10-19 MED ORDER — METFORMIN HCL 500 MG PO TABS: 500.0000 mg | ORAL_TABLET | Freq: Two times a day (BID) | ORAL | 1 refills | Status: DC

## 2018-10-19 MED ORDER — GABAPENTIN 100 MG PO CAPS: 100.0000 mg | ORAL_CAPSULE | Freq: Two times a day (BID) | ORAL | 1 refills | Status: DC

## 2018-10-19 MED ORDER — ATORVASTATIN CALCIUM 10 MG PO TABS: 10.0000 mg | ORAL_TABLET | Freq: Every day | ORAL | 1 refills | Status: DC

## 2018-10-19 NOTE — Telephone Encounter (Signed)
Prescriptions sent to [facility] at patient's request

## 2018-10-21 ENCOUNTER — Encounter: Payer: Self-pay | Admitting: Family

## 2018-10-22 MED ORDER — ALBUTEROL SULFATE HFA 108 (90 BASE) MCG/ACT IN AERS: 2.0000 | INHALATION_SPRAY | Freq: Four times a day (QID) | RESPIRATORY_TRACT | 1 refills | Status: DC | PRN

## 2018-10-22 MED ORDER — OMEPRAZOLE 20 MG PO CPDR: 20.0000 mg | DELAYED_RELEASE_CAPSULE | Freq: Every day | ORAL | 1 refills | Status: DC

## 2018-10-22 MED ORDER — FLUTICASONE PROPIONATE 50 MCG/ACT NA SUSP: NASAL | 1 refills | Status: DC

## 2018-10-22 NOTE — Telephone Encounter (Signed)
Medication called in to [facility] pharmacy for 90 days supply

## 2018-10-28 ENCOUNTER — Encounter: Payer: Self-pay | Admitting: Family

## 2018-10-29 MED ORDER — TRIAMCINOLONE ACETONIDE 0.5 % EX CREA: 1.0000 "application " | TOPICAL_CREAM | Freq: Every day | CUTANEOUS | 3 refills | Status: DC | PRN

## 2018-10-29 MED ORDER — ALBUTEROL SULFATE HFA 108 (90 BASE) MCG/ACT IN AERS: 2.0000 | INHALATION_SPRAY | Freq: Four times a day (QID) | RESPIRATORY_TRACT | 3 refills | Status: DC | PRN

## 2018-10-29 MED ORDER — VITAMIN D3 75 MCG (3000 UT) PO TABS: 1.0000 | ORAL_TABLET | Freq: Every day | ORAL | 3 refills | Status: DC

## 2018-10-29 MED ORDER — LORATADINE 10 MG PO TABS: 10.0000 mg | ORAL_TABLET | Freq: Every day | ORAL | 3 refills | Status: DC

## 2018-10-29 MED ORDER — FLUTICASONE PROPIONATE 50 MCG/ACT NA SUSP: NASAL | 3 refills | Status: DC

## 2018-10-29 MED ORDER — ACCU-CHEK FASTCLIX LANCETS MISC: 3 refills | Status: DC

## 2018-10-29 MED ORDER — DULERA 200-5 MCG/ACT IN AERO: INHALATION_SPRAY | RESPIRATORY_TRACT | 3 refills | Status: DC

## 2018-10-29 MED ORDER — OMEPRAZOLE 20 MG PO CPDR: 20.0000 mg | DELAYED_RELEASE_CAPSULE | Freq: Every day | ORAL | 3 refills | Status: DC

## 2018-11-20 ENCOUNTER — Encounter: Payer: Self-pay | Admitting: Endocrinology

## 2018-11-20 ENCOUNTER — Other Ambulatory Visit: Payer: Self-pay

## 2018-11-20 ENCOUNTER — Ambulatory Visit (INDEPENDENT_AMBULATORY_CARE_PROVIDER_SITE_OTHER): Payer: Medicare Other | Admitting: Endocrinology

## 2018-11-20 ENCOUNTER — Telehealth: Payer: Self-pay | Admitting: Endocrinology

## 2018-11-20 ENCOUNTER — Encounter: Payer: Self-pay | Admitting: Family

## 2018-11-20 DIAGNOSIS — E119 Type 2 diabetes mellitus without complications: Secondary | ICD-10-CM | POA: Diagnosis not present

## 2018-11-20 MED ORDER — MELOXICAM 7.5 MG PO TABS: 7.5000 mg | ORAL_TABLET | Freq: Every day | ORAL | 0 refills | Status: DC

## 2018-11-20 NOTE — Progress Notes (Signed)
Patient ID: Angelica Benson, female   DOB: [birth year], 84 y.o.   MRN: [id number]   Today's office visit was provided via telemedicine using video technique Explained to the patient and the the limitations of evaluation and management by telemedicine and the availability of in person appointments.  The patient understood the limitations and agreed to proceed. Patient also understood that the telehealth visit is billable. . Location of the patient: Home . Location of the provider: Office Only the patient and myself were participating in the encounter    Reason for Appointment : Followup of diabetes and other issues  History of Present Illness          Diagnosis: Type 2 diabetes mellitus, date of diagnosis: 1992       Past history: She is unclear about the date of her diagnosis and treatment history. At onset she had symptoms of frequent urination, weakness and sleepiness. She has been on various oral hypoglycemic drugs since her diagnosis. Has been on metformin since at least 2006 and Januvia since 2007. Also previously had taken glyburide and Avandia Her A1c has generally been in the 5.9-6.4 range previously  Recent history:   Oral hypoglycemic drugs the patient is taking are: Metformin 500 mg 2x a day   Current management:  Her A1c is now 6.4, previous range 5.6-6.9  She was started back on metformin in 2019 when her A1c had gone up to 6.9  She has checks blood sugars but mostly in the mornings  She has a CNA who comes to check her blood sugars at times and most of the readings are reportedly fasting  She generally tries to avoid high-fat foods or snacks  She tries to walk indoors in her apartment building  Her weight is about the same as usual at 184  Taking her metformin consistently     Side effects from medications have been: None  Glucose monitoring:  Rarely.   Glucometer:  Accu-Chek guide     Blood Glucose readings from patient's meter by history   FASTING range 81-111   Self-care: The diet that the patient has been following is: tries to limit high-fat foods     Meals: 3 meals per day.  at breakfast has instant oatmeal and toast.  At lunch will have sandwich and fruit; dinner is chicken, starch and vegetables; her snacks are fruit, peanut butter or cheese crackers      Exercise:    will walk around her living quarters, uses a walker  Dietician visit: Most recent: 2000            Compliance with the medical regimen: Fairly good  Weight history:  Wt Readings from Last 3 Encounters:  09/15/18 184 lb 15.5 oz (83.9 kg)  04/28/18 185 lb (83.9 kg)  03/18/18 187 lb 3.2 oz (84.9 kg)   Glycemic control:   Lab Results  Component Value Date   HGBA1C 6.4 10/15/2018   HGBA1C 5.6 03/18/2018   HGBA1C 6.9 (A) 12/11/2017   Lab Results  Component Value Date   MICROALBUR <0.7 04/04/2017   LDLCALC 30 10/15/2018   CREATININE 1.05 10/15/2018        Allergies as of 11/20/2018      Reactions   Buprenorphine Hcl Rash   Morphine And Related Rash      Medication List       Accurate as of November 20, 2018  1:06 PM. If you have any questions, ask your nurse or doctor.  Accu-Chek FastClix Lancets Misc Use as directed to check blood sugar.   acetaminophen 500 MG tablet Commonly known as: TYLENOL Take 2 tablets (1,000 mg total) by mouth 2 (two) times daily as needed for moderate pain.   albuterol 108 (90 Base) MCG/ACT inhaler Commonly known as: ProAir HFA Inhale 2 puffs into the lungs every 6 (six) hours as needed for wheezing or shortness of breath.   aspirin EC 81 MG tablet Take 81 mg by mouth daily.   atorvastatin 10 MG tablet Commonly known as: LIPITOR Take 1 tablet (10 mg total) by mouth daily.   Centrum Silver 50+Women Tabs Take 1 tablet by mouth daily.   diclofenac sodium 1 % Gel Commonly known as: VOLTAREN Apply 4 g topically 4 (four) times daily. What changed: when to take this   Dulera 200-5 MCG/ACT  Aero Generic drug: mometasone-formoterol INHALE ONE PUFF INTO LUNGS EVERY 12 HOURS   fluticasone 50 MCG/ACT nasal spray Commonly known as: FLONASE INSTILL 2 SPRAYS IN EACH NOSTRIL DAILY   gabapentin 100 MG capsule Commonly known as: NEURONTIN Take 1 capsule (100 mg total) by mouth 2 (two) times daily.   glucose blood test strip Commonly known as: Accu-Chek Aviva Use as instructed   lisinopril 5 MG tablet Commonly known as: ZESTRIL Take 1 tablet (5 mg total) by mouth daily.   loratadine 10 MG tablet Commonly known as: CLARITIN Take 1 tablet (10 mg total) by mouth daily.   meloxicam 7.5 MG tablet Commonly known as: MOBIC Take 1 tablet (7.5 mg total) by mouth daily. Started by: Nance Pear, NP   metFORMIN 500 MG tablet Commonly known as: GLUCOPHAGE Take 1 tablet (500 mg total) by mouth 2 (two) times daily with a meal.   nitroGLYCERIN 0.4 MG SL tablet Commonly known as: NITROSTAT Place 1 tablet (0.4 mg total) under the tongue every 5 (five) minutes as needed for chest pain.   omeprazole 20 MG capsule Commonly known as: PRILOSEC Take 1 capsule (20 mg total) by mouth daily.   triamcinolone cream 0.5 % Commonly known as: KENALOG Apply 1 application topically daily as needed.   Vitamin D3 75 MCG (3000 UT) Tabs Take 1 tablet by mouth daily.       Allergies:  Allergies  Allergen Reactions  . Buprenorphine Hcl Rash  . Morphine And Related Rash    Past Medical History:  Diagnosis Date  . Arthritis   . Asthma   . CHF (congestive heart failure) ([place])   . Chronic headaches   . Constipation   . Diabetes mellitus without complication ([place])   . Hemorrhoids without complication   . Hypertension     Past Surgical History:  Procedure Laterality Date  . ABDOMINAL HYSTERECTOMY    . BACK SURGERY    . CHOLECYSTECTOMY    . KNEE SURGERY      Family History  Problem Relation Age of Onset  . Diabetes Son   . Hypertension Son   . Cancer Sister         breast  . Diabetes Daughter        plus 2 grandkids  . Stroke Son        44mo age dx, has enlarged heart  . Heart disease Son        passed away    Social History:  reports that she has never smoked. She has never used smokeless tobacco. She reports that she does not drink alcohol or use drugs.    Review of Systems  Lipids: Has not been on treatment with a statin drug, levels are low normal without statin drugs  Lab Results  Component Value Date   CHOL 100 10/15/2018   HDL 56.60 10/15/2018   LDLCALC 30 10/15/2018   TRIG 71.0 10/15/2018   CHOLHDL 2 10/15/2018       The blood pressure has been controlled with HCTZ, followed by PCP Does not monitor at home  History of anemia, she has not discussed with PCP recently, no prior history of B12 deficiency  Lab Results  Component Value Date   WBC 4.3 10/15/2018   HGB 10.9 (L) 10/15/2018   HCT 32.9 (L) 10/15/2018   MCV 90.7 10/15/2018   PLT 196.0 10/15/2018   Lab Results  Component Value Date   VITAMINB12 384 10/14/2017     NEUROPATHY: On 100 mg gabapentin twice daily from PCP   Diabetic foot exam in 5/20 by podiatrist   She has a history of a small goiter in the past, euthyroid  Lab Results  Component Value Date   TSH 2.22 10/15/2018    Physical Examination:  There were no vitals taken for this visit.     ASSESSMENT/ PLAN:   Diabetes type 2 with obesity, relatively mild and well controlled  See history of present illness for  discussion of current diabetes management, blood sugar patterns and problems identified  She is on metformin alone  A1c 6.4  Blood sugars are being checked with the help of her CNA and these are being done in the mornings only, likely more fasting than after meals Considering her age her A1c and blood sugar levels are excellent She is usually trying to eat a prudent diet Also consistent with her medications  ANEMIA: She will follow-up with PCP  There are no Patient  Instructions on file for this visit.   Elayne Snare 11/20/2018, 1:06 PM

## 2018-11-20 NOTE — Telephone Encounter (Signed)
error 

## 2018-11-30 LAB — HM DIABETES EYE EXAM

## 2018-12-01 ENCOUNTER — Other Ambulatory Visit: Payer: Self-pay

## 2018-12-01 ENCOUNTER — Ambulatory Visit: Payer: Medicare Other | Admitting: Podiatry

## 2018-12-02 ENCOUNTER — Other Ambulatory Visit: Payer: Self-pay

## 2018-12-02 ENCOUNTER — Emergency Department (HOSPITAL_COMMUNITY)
Admission: EM | Admit: 2018-12-02 | Discharge: 2018-12-02 | Disposition: A | Discharge status: Home / Self Care | Payer: Medicare Other | Attending: Emergency Medicine | Admitting: Emergency Medicine

## 2018-12-02 DIAGNOSIS — X58XXXA Exposure to other specified factors, initial encounter: Secondary | ICD-10-CM | POA: Insufficient documentation

## 2018-12-02 DIAGNOSIS — Z79899 Other long term (current) drug therapy: Secondary | ICD-10-CM | POA: Insufficient documentation

## 2018-12-02 DIAGNOSIS — I110 Hypertensive heart disease with heart failure: Secondary | ICD-10-CM | POA: Diagnosis not present

## 2018-12-02 DIAGNOSIS — Y9389 Activity, other specified: Secondary | ICD-10-CM | POA: Insufficient documentation

## 2018-12-02 DIAGNOSIS — Z7982 Long term (current) use of aspirin: Secondary | ICD-10-CM | POA: Insufficient documentation

## 2018-12-02 DIAGNOSIS — I5032 Chronic diastolic (congestive) heart failure: Secondary | ICD-10-CM | POA: Insufficient documentation

## 2018-12-02 DIAGNOSIS — Y9289 Other specified places as the place of occurrence of the external cause: Secondary | ICD-10-CM | POA: Diagnosis not present

## 2018-12-02 DIAGNOSIS — Y999 Unspecified external cause status: Secondary | ICD-10-CM | POA: Diagnosis not present

## 2018-12-02 DIAGNOSIS — E119 Type 2 diabetes mellitus without complications: Secondary | ICD-10-CM | POA: Diagnosis not present

## 2018-12-02 DIAGNOSIS — J45909 Unspecified asthma, uncomplicated: Secondary | ICD-10-CM | POA: Diagnosis not present

## 2018-12-02 DIAGNOSIS — S0592XA Unspecified injury of left eye and orbit, initial encounter: Secondary | ICD-10-CM | POA: Insufficient documentation

## 2018-12-02 DIAGNOSIS — Z8673 Personal history of transient ischemic attack (TIA), and cerebral infarction without residual deficits: Secondary | ICD-10-CM | POA: Insufficient documentation

## 2018-12-02 DIAGNOSIS — Z7984 Long term (current) use of oral hypoglycemic drugs: Secondary | ICD-10-CM | POA: Diagnosis not present

## 2018-12-02 DIAGNOSIS — I509 Heart failure, unspecified: Secondary | ICD-10-CM | POA: Insufficient documentation

## 2018-12-02 MED ORDER — FLUORESCEIN SODIUM 1 MG OP STRP
1.0000 | ORAL_STRIP | Freq: Once | OPHTHALMIC | Status: AC
  Administered 2018-12-02: 1 via OPHTHALMIC
  Filled 2018-12-02: qty 1

## 2018-12-02 MED ORDER — TETRACAINE HCL 0.5 % OP SOLN
1.0000 [drp] | Freq: Once | OPHTHALMIC | Status: AC
  Administered 2018-12-02: 1 [drp] via OPHTHALMIC
  Filled 2018-12-02: qty 4

## 2018-12-02 NOTE — ED Notes (Signed)
Daughter Candyce Churn waiting to visit with mother. Phone number [phone].

## 2018-12-02 NOTE — ED Triage Notes (Signed)
Pt was sitting outside today when a bug flew in her L eye. Pt endorses pain in her L eye since then. No change in vision.

## 2018-12-02 NOTE — Discharge Instructions (Signed)
Please return to the emergency room immediately if develop any new or worsening signs or symptoms.

## 2018-12-02 NOTE — ED Provider Notes (Signed)
[facility] EMERGENCY DEPARTMENT Provider Note   CSN: [id number] Arrival date & time: 12/02/18  1401    History   Chief Complaint No chief complaint on file.   HPI Angelica Benson is a 85 y.o. female.     HPI   85 year old female presents today with complaints of foreign body to her eye.  She notes she was out sitting on her porch when a bug flew and hit her in the eye.  She notes pain to the eye.  This is nonsevere.  She denies any visual changes.  She notes some irritation, no foreign body sensation to light sensitivity.  She does wear glasses but was not wearing them today.  Past Medical History:  Diagnosis Date   Arthritis    Asthma    CHF (congestive heart failure) ([facility])    Chronic headaches    Constipation    Diabetes mellitus without complication ([place])    Hemorrhoids without complication    Hypertension     Patient Active Problem List   Diagnosis Date Noted   Dizziness 10/02/2017   Liver mass 10/02/2017   Orthostatic hypotension 10/02/2017   Left flank pain 10/02/2017   Arthritis 02/19/2017   Hypertension    Diabetes mellitus without complication ([facility])    Chronic headaches    CHF (congestive heart failure) ([place])    Asthma    Carpal tunnel syndrome 09/04/2015   Bilateral sensorineural hearing loss 06/07/2015   Cervical disc disorder with radiculopathy 06/07/2015   Cervical disc disorder with radiculopathy of cervical region    TIA (transient ischemic attack) 03/22/2015   Temporary cerebral vascular dysfunction 03/22/2015   Obesity, unspecified 08/18/2013   Goiter 08/18/2013   Anemia, chronic disease 08/18/2013   Anemia 06/14/2013   Type 2 diabetes mellitus ([place]) 06/14/2013   Chronic diastolic heart failure ([place]) 04/05/2010   Benign essential hypertension 04/05/2010    Past Surgical History:  Procedure Laterality Date   ABDOMINAL HYSTERECTOMY     BACK SURGERY     CHOLECYSTECTOMY     KNEE  SURGERY       OB History   No obstetric history on file.      Home Medications    Prior to Admission medications   Medication Sig Start Date End Date Taking? Authorizing Provider  Accu-Chek FastClix Lancets MISC Use as directed to check blood sugar. 10/29/18   Debbrah Alar, NP  acetaminophen (TYLENOL) 500 MG tablet Take 2 tablets (1,000 mg total) by mouth 2 (two) times daily as needed for moderate pain. 08/15/17   Debbrah Alar, NP  albuterol (PROAIR HFA) 108 (90 Base) MCG/ACT inhaler Inhale 2 puffs into the lungs every 6 (six) hours as needed for wheezing or shortness of breath. 10/29/18   Debbrah Alar, NP  aspirin EC 81 MG tablet Take 81 mg by mouth daily.    [provider]  atorvastatin (LIPITOR) 10 MG tablet Take 1 tablet (10 mg total) by mouth daily. 10/19/18   Debbrah Alar, NP  Cholecalciferol (VITAMIN D3) 75 MCG (3000 UT) TABS Take 1 tablet by mouth daily. 10/29/18   Debbrah Alar, NP  diclofenac sodium (VOLTAREN) 1 % GEL Apply 4 g topically 4 (four) times daily. Patient taking differently: Apply 4 g topically daily at 12 noon.  01/21/17   Forde Dandy, MD  fluticasone [facility]) 50 MCG/ACT nasal spray INSTILL 2 SPRAYS IN [facility] NOSTRIL DAILY 10/29/18   Debbrah Alar, NP  gabapentin (NEURONTIN) 100 MG capsule Take 1 capsule (100  mg total) by mouth 2 (two) times daily. 10/19/18   Debbrah Alar, NP  glucose blood (ACCU-CHEK AVIVA) test strip Use as instructed 07/23/17   Debbrah Alar, NP  lisinopril (ZESTRIL) 5 MG tablet Take 1 tablet (5 mg total) by mouth daily. 10/19/18   Debbrah Alar, NP  loratadine (CLARITIN) 10 MG tablet Take 1 tablet (10 mg total) by mouth daily. 10/29/18   Debbrah Alar, NP  meloxicam (MOBIC) 7.5 MG tablet Take 1 tablet (7.5 mg total) by mouth daily. 11/20/18   Debbrah Alar, NP  metFORMIN (GLUCOPHAGE) 500 MG tablet Take 1 tablet (500 mg total) by mouth 2 (two) times daily with a meal. 10/19/18    Debbrah Alar, NP  mometasone-formoterol (DULERA) 200-5 MCG/ACT AERO INHALE ONE PUFF INTO LUNGS EVERY 12 HOURS 10/29/18   Debbrah Alar, NP  Multiple Vitamins-Minerals (CENTRUM SILVER 50+WOMEN) TABS Take 1 tablet by mouth daily. 04/28/18   Debbrah Alar, NP  nitroGLYCERIN (NITROSTAT) 0.4 MG SL tablet Place 1 tablet (0.4 mg total) under the tongue every 5 (five) minutes as needed for chest pain. 10/28/17   Debbrah Alar, NP  omeprazole (PRILOSEC) 20 MG capsule Take 1 capsule (20 mg total) by mouth daily. 10/29/18   Debbrah Alar, NP  triamcinolone cream (KENALOG) 0.5 % Apply 1 application topically daily as needed. 10/29/18   Debbrah Alar, NP    Family History Family History  Problem Relation Age of Onset   Diabetes Son    Hypertension Son    Cancer Sister        breast   Diabetes Daughter        plus 2 grandkids   Stroke Son        18mo age dx, has enlarged heart   Heart disease Son        passed away    Social History Social History   Tobacco Use   Smoking status: Never Smoker   Smokeless tobacco: Never Used  Substance Use Topics   Alcohol use: No   Drug use: No     Allergies   Buprenorphine hcl and Morphine and related   Review of Systems Review of Systems  All other systems reviewed and are negative.    Physical Exam Updated Vital Signs BP (!) 125/59 (BP Location: Right Arm)    Pulse 78    Temp 98.2 F (36.8 C) (Oral)    Resp 16    SpO2 100%   Physical Exam Vitals signs and nursing note reviewed.  Constitutional:      Appearance: She is well-developed.  HENT:     Head: Normocephalic and atraumatic.     Comments: Minor medial conjunctival injection of the left eye remainder of eye atraumatic, no fluorescein uptake, no corneal defects, extraocular movements intact and pain-free, vision normal bilateral no surrounding redness or discharge Eyes:     General: No scleral icterus.       Right eye: No discharge.         Left eye: No discharge.     Conjunctiva/sclera: Conjunctivae normal.     Pupils: Pupils are equal, round, and reactive to light.  Neck:     Musculoskeletal: Normal range of motion.     Vascular: No JVD.     Trachea: No tracheal deviation.  Pulmonary:     Effort: Pulmonary effort is normal.     Breath sounds: No stridor.  Neurological:     Mental Status: She is alert and oriented to person, place, and time.  Coordination: Coordination normal.  Psychiatric:        Behavior: Behavior normal.        Thought Content: Thought content normal.        Judgment: Judgment normal.      ED Treatments / Results  Labs (all labs ordered are listed, but only abnormal results are displayed) Labs Reviewed - No data to display  EKG None  Radiology No results found.  Procedures Procedures (including critical care time)  Medications Ordered in ED Medications  fluorescein ophthalmic strip 1 strip (has no administration in time range)  tetracaine (PONTOCAINE) 0.5 % ophthalmic solution 1 drop (has no administration in time range)     Initial Impression / Assessment and Plan / ED Course  I have reviewed the triage vital signs and the nursing notes.  Pertinent labs & imaging results that were available during my care of the patient were reviewed by me and considered in my medical decision making (see chart for details).        85 year old female presents today with foreign body to her eye.  She has no signs of corneal abrasion, no significant trauma.  Patient stable for discharge with strict return precautions.  Verbalized understanding and agreement to this plan had no further questions or concerns.  Final Clinical Impressions(s) / ED Diagnoses   Final diagnoses:  Left eye injury, initial encounter    ED Discharge Orders    None       Okey Regal, PA-C 12/02/18 1547    Blanchie Dessert, MD 12/03/18 [phone]

## 2018-12-05 ENCOUNTER — Other Ambulatory Visit: Payer: Self-pay | Admitting: Endocrinology

## 2018-12-05 ENCOUNTER — Other Ambulatory Visit: Payer: Self-pay | Admitting: Family

## 2018-12-07 ENCOUNTER — Encounter: Payer: Self-pay | Admitting: Family

## 2018-12-07 ENCOUNTER — Other Ambulatory Visit: Payer: Self-pay | Admitting: Family

## 2018-12-08 MED ORDER — MELOXICAM 7.5 MG PO TABS: 7.5000 mg | ORAL_TABLET | Freq: Every day | ORAL | 0 refills | Status: DC | PRN

## 2018-12-11 ENCOUNTER — Ambulatory Visit: Payer: Medicare Other | Admitting: Family

## 2018-12-15 ENCOUNTER — Other Ambulatory Visit: Payer: Self-pay

## 2018-12-15 ENCOUNTER — Ambulatory Visit (INDEPENDENT_AMBULATORY_CARE_PROVIDER_SITE_OTHER): Payer: Medicare Other | Admitting: Podiatry

## 2018-12-15 ENCOUNTER — Encounter: Payer: Self-pay | Admitting: Podiatry

## 2018-12-15 DIAGNOSIS — E119 Type 2 diabetes mellitus without complications: Secondary | ICD-10-CM

## 2018-12-15 DIAGNOSIS — M79675 Pain in left toe(s): Secondary | ICD-10-CM

## 2018-12-15 DIAGNOSIS — M79674 Pain in right toe(s): Secondary | ICD-10-CM

## 2018-12-15 DIAGNOSIS — B351 Tinea unguium: Secondary | ICD-10-CM | POA: Diagnosis not present

## 2018-12-15 NOTE — Progress Notes (Signed)
Patient ID: BLEU FRANCESCONI, female   DOB: Jan 17, 1929, 85 y.o.   MRN: [id number] Complaint:  Visit Type: Patient returns to my office for continued preventative foot care services. Complaint: Patient states" my nails have grown long and thick and become painful to walk and wear shoes" Patient has been diagnosed with DM with neuropathy.. The patient presents for preventative foot care services. No changes to ROS  Podiatric Exam: Vascular: dorsalis pedis and posterior tibial pulses are palpable bilateral. Capillary return is immediate. Temperature gradient is WNL. Skin turgor WNL  Sensorium: Diminished  Semmes Weinstein monofilament test. Normal tactile sensation bilaterally. Nail Exam: Pt has thick disfigured discolored nails with subungual debris noted bilateral entire nail hallux through fifth toenails Ulcer Exam: There is no evidence of ulcer or pre-ulcerative changes or infection. Orthopedic Exam: Muscle tone and strength are WNL. No limitations in general ROM. No crepitus or effusions noted. Foot type and digits show no abnormalities. Bony prominences are unremarkable. Skin: No Porokeratosis. No infection or ulcers  Diagnosis:  Onychomycosis, , Pain in right toe, pain in left toes  Treatment & Plan Procedures and Treatment: Consent by patient was obtained for treatment procedures. The patient understood the discussion of treatment and procedures well. All questions were answered thoroughly reviewed. Debridement of mycotic and hypertrophic toenails, 1 through 5 bilateral and clearing of subungual debris. No ulceration, no infection noted.   Return Visit-Office Procedure: Patient instructed to return to the office for a follow up visit 3 months for continued evaluation and treatment.  Gardiner Barefoot DPM

## 2018-12-29 ENCOUNTER — Ambulatory Visit: Payer: Medicare Other | Admitting: Family

## 2018-12-29 ENCOUNTER — Ambulatory Visit (INDEPENDENT_AMBULATORY_CARE_PROVIDER_SITE_OTHER): Payer: Medicare Other | Admitting: Family

## 2018-12-29 ENCOUNTER — Other Ambulatory Visit: Payer: Self-pay

## 2018-12-29 ENCOUNTER — Encounter: Payer: Self-pay | Admitting: Family

## 2018-12-29 VITALS — BP 120/57 | HR 75 | Temp 98.2°F | Resp 16 | Wt 169.0 lb

## 2018-12-29 DIAGNOSIS — E1142 Type 2 diabetes mellitus with diabetic polyneuropathy: Secondary | ICD-10-CM

## 2018-12-29 DIAGNOSIS — E119 Type 2 diabetes mellitus without complications: Secondary | ICD-10-CM

## 2018-12-29 DIAGNOSIS — G4700 Insomnia, unspecified: Secondary | ICD-10-CM | POA: Diagnosis not present

## 2018-12-29 DIAGNOSIS — K219 Gastro-esophageal reflux disease without esophagitis: Secondary | ICD-10-CM

## 2018-12-29 DIAGNOSIS — Z23 Encounter for immunization: Secondary | ICD-10-CM | POA: Diagnosis not present

## 2018-12-29 DIAGNOSIS — I10 Essential (primary) hypertension: Secondary | ICD-10-CM | POA: Diagnosis not present

## 2018-12-29 NOTE — Progress Notes (Signed)
Subjective:    Patient ID: Angelica Benson, female    DOB: 1928-12-23, 85 y.o.   MRN: [id number]  HPI  Patient is an 85 yr old female who presents today for follow up.  HTN- mantained on lisinopril 5mg .  BP Readings from Last 3 Encounters:  12/29/18 (!) 120/57  12/02/18 (!) 125/91  09/15/18 (!) 128/55   DM2- on metformin only.  Lab Results  Component Value Date   HGBA1C 6.4 10/15/2018   HGBA1C 5.6 03/18/2018   HGBA1C 6.9 (A) 12/11/2017   Lab Results  Component Value Date   MICROALBUR <0.7 04/04/2017   LDLCALC 30 10/15/2018   CREATININE 1.05 10/15/2018   Peripheral neuropathy- maintained on gabapentin. Reports symptoms are well controlled.  Asthma- reports no recent symptoms.   GERD- maintained on omeprazole. Reports symptoms are well controlled.   Hyperlipidemia- maintained on lipitor 10mg .  Denies myalgia.  Lab Results  Component Value Date   CHOL 100 10/15/2018   HDL 56.60 10/15/2018   LDLCALC 30 10/15/2018   TRIG 71.0 10/15/2018   CHOLHDL 2 10/15/2018   Reports some trouble sleeping.  Reports that she does not nap during the day.  Has tea in the AM.  Leaves tv on.     Past Medical History:  Diagnosis Date  . Arthritis   . Asthma   . CHF (congestive heart failure) ([place])   . Chronic headaches   . Constipation   . Diabetes mellitus without complication ([place])   . Hemorrhoids without complication   . Hypertension      Social History   Socioeconomic History  . Marital status: Single    Spouse name: Not on file  . Number of children: Not on file  . Years of education: Not on file  . Highest education level: Not on file  Occupational History  . Not on file  Social Needs  . Financial resource strain: Not on file  . Food insecurity    Worry: Not on file    Inability: Not on file  . Transportation needs    Medical: Not on file    Non-medical: Not on file  Tobacco Use  . Smoking status: Never Smoker  . Smokeless tobacco: Never Used  Substance and  Sexual Activity  . Alcohol use: No  . Drug use: No  . Sexual activity: Not Currently  Lifestyle  . Physical activity    Days per week: Not on file    Minutes per session: Not on file  . Stress: Not on file  Relationships  . Social Herbalist on phone: Not on file    Gets together: Not on file    Attends religious service: Not on file    Active member of club or organization: Not on file    Attends meetings of clubs or organizations: Not on file    Relationship status: Not on file  . Intimate partner violence    Fear of current or ex partner: Not on file    Emotionally abused: Not on file    Physically abused: Not on file    Forced sexual activity: Not on file  Other Topics Concern  . Not on file  Social History Narrative   Lives at home by herself, aide in the am.     Has 4 daughters and 3 living sons (one son died).    Uses walker.     Divorced at age 45    Past Surgical History:  Procedure Laterality  Date  . ABDOMINAL HYSTERECTOMY    . BACK SURGERY    . CHOLECYSTECTOMY    . KNEE SURGERY      Family History  Problem Relation Age of Onset  . Diabetes Son   . Hypertension Son   . Cancer Sister        breast  . Diabetes Daughter        plus 2 grandkids  . Stroke Son        54mo age dx, has enlarged heart  . Heart disease Son        passed away    Allergies  Allergen Reactions  . Buprenorphine Hcl Rash  . Morphine And Related Rash    Current Outpatient Medications on File Prior to Visit  Medication Sig Dispense Refill  . Accu-Chek FastClix Lancets MISC Use as directed to check blood sugar. 102 each 3  . acetaminophen (TYLENOL) 500 MG tablet Take 2 tablets (1,000 mg total) by mouth 2 (two) times daily as needed for moderate pain. 30 tablet 0  . albuterol (PROAIR HFA) 108 (90 Base) MCG/ACT inhaler Inhale 2 puffs into the lungs every 6 (six) hours as needed for wheezing or shortness of breath. 20.1 g 3  . aspirin EC 81 MG tablet Take 81 mg by mouth  daily.    Marland Kitchen atorvastatin (LIPITOR) 10 MG tablet Take 1 tablet (10 mg total) by mouth daily. 90 tablet 1  . Cholecalciferol (VITAMIN D3) 75 MCG (3000 UT) TABS Take 1 tablet by mouth daily. 90 tablet 3  . diclofenac sodium (VOLTAREN) 1 % GEL Apply 4 g topically 4 (four) times daily. (Patient taking differently: Apply 4 g topically daily at 12 noon. ) 100 g 0  . fluticasone (FLONASE) 50 MCG/ACT nasal spray INSTILL 2 SPRAYS IN EACH NOSTRIL DAILY 48 g 3  . glucose blood (ACCU-CHEK AVIVA) test strip Use as instructed 100 each 12  . lisinopril (ZESTRIL) 5 MG tablet Take 1 tablet (5 mg total) by mouth daily. 90 tablet 1  . loratadine (CLARITIN) 10 MG tablet Take 1 tablet (10 mg total) by mouth daily. 90 tablet 3  . meloxicam (MOBIC) 7.5 MG tablet Take 1 tablet (7.5 mg total) by mouth daily as needed for pain. 30 tablet 0  . metFORMIN (GLUCOPHAGE) 500 MG tablet TAKE 1 TABLET BY MOUTH TWICE DAILY WITH MEALS 60 tablet 2  . mometasone-formoterol (DULERA) 200-5 MCG/ACT AERO INHALE ONE PUFF INTO LUNGS EVERY 12 HOURS 26.4 g 3  . Multiple Vitamins-Minerals (CENTRUM SILVER 50+WOMEN) TABS Take 1 tablet by mouth daily.    . nitroGLYCERIN (NITROSTAT) 0.4 MG SL tablet Place 1 tablet (0.4 mg total) under the tongue every 5 (five) minutes as needed for chest pain. 20 tablet 1  . omeprazole (PRILOSEC) 20 MG capsule Take 1 capsule (20 mg total) by mouth daily. 90 capsule 3  . triamcinolone cream (KENALOG) 0.5 % Apply 1 application topically daily as needed. 45 g 3  . gabapentin (NEURONTIN) 100 MG capsule Take 1 capsule (100 mg total) by mouth 2 (two) times daily. 180 capsule 1   No current facility-administered medications on file prior to visit.     BP (!) 120/57 (BP Location: Left Arm, Patient Position: Sitting, Cuff Size: Small)   Pulse 75   Temp 98.2 F (36.8 C) (Oral)   Resp 16   Wt 169 lb (76.7 kg)   SpO2 96%   BMI 30.91 kg/m    Objective:   Physical Exam Constitutional:  Appearance: She is  well-developed.  Neck:     Musculoskeletal: Neck supple.     Thyroid: No thyromegaly.  Cardiovascular:     Rate and Rhythm: Normal rate and regular rhythm.     Heart sounds: Normal heart sounds. No murmur.  Pulmonary:     Effort: Pulmonary effort is normal. No respiratory distress.     Breath sounds: Normal breath sounds. No wheezing.  Skin:    General: Skin is warm and dry.  Neurological:     Mental Status: She is alert and oriented to person, place, and time.  Psychiatric:        Behavior: Behavior normal.        Thought Content: Thought content normal.        Judgment: Judgment normal.           Assessment & Plan:  DM2- sugar is stable on current medications. Continue same.  HTN- bp stable, continue ace for bp and renal protection.  Hyperlipidemia- tolerating statin, LDL at goal, continue same.  GERD- stable on PPI, continue same.  Peripheral neuropathy- stable on gabapentin, continue same.  Insomnia- discussed good sleep hygiene habits. Would avoid sedatives due to her age.  Flu shot today.

## 2019-01-04 ENCOUNTER — Other Ambulatory Visit: Payer: Self-pay | Admitting: *Deleted

## 2019-01-04 MED ORDER — ATORVASTATIN CALCIUM 10 MG PO TABS: 10.0000 mg | ORAL_TABLET | Freq: Every day | ORAL | 1 refills | Status: DC

## 2019-02-10 ENCOUNTER — Other Ambulatory Visit: Payer: Self-pay | Admitting: Family

## 2019-02-10 DIAGNOSIS — M792 Neuralgia and neuritis, unspecified: Secondary | ICD-10-CM

## 2019-03-12 ENCOUNTER — Other Ambulatory Visit: Payer: Self-pay | Admitting: Family

## 2019-03-12 DIAGNOSIS — M792 Neuralgia and neuritis, unspecified: Secondary | ICD-10-CM

## 2019-03-16 ENCOUNTER — Other Ambulatory Visit: Payer: Self-pay

## 2019-03-16 ENCOUNTER — Ambulatory Visit (INDEPENDENT_AMBULATORY_CARE_PROVIDER_SITE_OTHER): Payer: Medicare Other | Admitting: Podiatry

## 2019-03-16 ENCOUNTER — Encounter: Payer: Self-pay | Admitting: Podiatry

## 2019-03-16 DIAGNOSIS — M79675 Pain in left toe(s): Secondary | ICD-10-CM

## 2019-03-16 DIAGNOSIS — M79674 Pain in right toe(s): Secondary | ICD-10-CM | POA: Diagnosis not present

## 2019-03-16 DIAGNOSIS — E119 Type 2 diabetes mellitus without complications: Secondary | ICD-10-CM

## 2019-03-16 DIAGNOSIS — B351 Tinea unguium: Secondary | ICD-10-CM

## 2019-03-16 NOTE — Progress Notes (Signed)
Patient ID: BLEU FRANCESCONI, female   DOB: Jan 17, 1929, 85 y.o.   MRN: [id number] Complaint:  Visit Type: Patient returns to my office for continued preventative foot care services. Complaint: Patient states" my nails have grown long and thick and become painful to walk and wear shoes" Patient has been diagnosed with DM with neuropathy.. The patient presents for preventative foot care services. No changes to ROS  Podiatric Exam: Vascular: dorsalis pedis and posterior tibial pulses are palpable bilateral. Capillary return is immediate. Temperature gradient is WNL. Skin turgor WNL  Sensorium: Diminished  Semmes Weinstein monofilament test. Normal tactile sensation bilaterally. Nail Exam: Pt has thick disfigured discolored nails with subungual debris noted bilateral entire nail hallux through fifth toenails Ulcer Exam: There is no evidence of ulcer or pre-ulcerative changes or infection. Orthopedic Exam: Muscle tone and strength are WNL. No limitations in general ROM. No crepitus or effusions noted. Foot type and digits show no abnormalities. Bony prominences are unremarkable. Skin: No Porokeratosis. No infection or ulcers  Diagnosis:  Onychomycosis, , Pain in right toe, pain in left toes  Treatment & Plan Procedures and Treatment: Consent by patient was obtained for treatment procedures. The patient understood the discussion of treatment and procedures well. All questions were answered thoroughly reviewed. Debridement of mycotic and hypertrophic toenails, 1 through 5 bilateral and clearing of subungual debris. No ulceration, no infection noted.   Return Visit-Office Procedure: Patient instructed to return to the office for a follow up visit 3 months for continued evaluation and treatment.  Gardiner Barefoot DPM

## 2019-03-23 ENCOUNTER — Ambulatory Visit (INDEPENDENT_AMBULATORY_CARE_PROVIDER_SITE_OTHER): Payer: Medicare Other | Admitting: Medical

## 2019-03-23 ENCOUNTER — Encounter: Payer: Self-pay | Admitting: Medical

## 2019-03-23 ENCOUNTER — Other Ambulatory Visit: Payer: Self-pay

## 2019-03-23 VITALS — BP 152/73

## 2019-03-23 DIAGNOSIS — Z20828 Contact with and (suspected) exposure to other viral communicable diseases: Secondary | ICD-10-CM | POA: Diagnosis not present

## 2019-03-23 DIAGNOSIS — Z20822 Contact with and (suspected) exposure to covid-19: Secondary | ICD-10-CM

## 2019-03-23 NOTE — Patient Instructions (Signed)
For your covid exposure but asymptomatic recommend get tested for covid today. If you get any signs or symptoms let us know. Recommend stay at home/quarantined until test results come back. Hopefully it will be relatively short turn around time for covid test.  Follow up as regularly scheduled with pcp/monday or as needed

## 2019-03-23 NOTE — Progress Notes (Signed)
Subjective:    Patient ID: Angelica Benson, female    DOB: 11/26/1928, 85 y.o.   MRN: [id number]  HPI Virtual Visit via Video Note  I connected with Angelica Benson on 03/23/19 at  8:40 AM EST by a video enabled telemedicine application and verified that I am speaking with the correct person using two identifiers.  Location: Patient: home Provider: office   I discussed the limitations of evaluation and management by telemedicine and the availability of in person appointments. The patient expressed understanding and agreed to proceed.  History of Present Illness: Pt had exposure through her nurses aid sister(who tested positive) that comes over 2-3 hours a day to help.   Pt has no symptoms.     Observations/Objective: Not viewed. Talked with pt daughter who is with her. Overall description of Angelica Benson is doing well today.    Assessment and Plan: For your covid exposure but asymptomatic recommend get tested for covid today. If you get any signs or symptoms let us know. Recommend stay at home/quarantined until test results come back. Hopefully it will be relatively short turn around time for covid test.  Follow up as regularly scheduled with pcp/monday or as needed.  Mackie Pai, PA-C  Follow Up Instructions:    I discussed the assessment and treatment plan with the patient. The patient was provided an opportunity to ask questions and all were answered. The patient agreed with the plan and demonstrated an understanding of the instructions.   The patient was advised to call back or seek an in-person evaluation if the symptoms worsen or if the condition fails to improve as anticipated.  I provided 25 minutes of non-face-to-face time during this encounter.   Mackie Pai, PA-C    Review of Systems  Constitutional: Negative for chills, fatigue and fever.  HENT: Negative for congestion.   Respiratory: Negative for cough, chest tightness, shortness of breath and wheezing.    Cardiovascular: Negative for chest pain and palpitations.  Gastrointestinal: Negative for abdominal pain.  Musculoskeletal: Negative for back pain.  Neurological: Negative for dizziness and headaches.  Hematological: Negative for adenopathy. Does not bruise/bleed easily.  Psychiatric/Behavioral: Negative for behavioral problems and hallucinations. The patient is not hyperactive.     Past Medical History:  Diagnosis Date  . Arthritis   . Asthma   . CHF (congestive heart failure) ([place])   . Chronic headaches   . Constipation   . Diabetes mellitus without complication ([place])   . Hemorrhoids without complication   . Hypertension      Social History   Socioeconomic History  . Marital status: Single    Spouse name: Not on file  . Number of children: Not on file  . Years of education: Not on file  . Highest education level: Not on file  Occupational History  . Not on file  Social Needs  . Financial resource strain: Not on file  . Food insecurity    Worry: Not on file    Inability: Not on file  . Transportation needs    Medical: Not on file    Non-medical: Not on file  Tobacco Use  . Smoking status: Never Smoker  . Smokeless tobacco: Never Used  Substance and Sexual Activity  . Alcohol use: No  . Drug use: No  . Sexual activity: Not Currently  Lifestyle  . Physical activity    Days per week: Not on file    Minutes per session: Not on file  . Stress: Not  on file  Relationships  . Social Herbalist on phone: Not on file    Gets together: Not on file    Attends religious service: Not on file    Active member of club or organization: Not on file    Attends meetings of clubs or organizations: Not on file    Relationship status: Not on file  . Intimate partner violence    Fear of current or ex partner: Not on file    Emotionally abused: Not on file    Physically abused: Not on file    Forced sexual activity: Not on file  Other Topics Concern  . Not on file   Social History Narrative   Lives at home by herself, aide in the am.     Has 4 daughters and 3 living sons (one son died).    Uses walker.     Divorced at age 68    Past Surgical History:  Procedure Laterality Date  . ABDOMINAL HYSTERECTOMY    . BACK SURGERY    . CHOLECYSTECTOMY    . KNEE SURGERY      Family History  Problem Relation Age of Onset  . Diabetes Son   . Hypertension Son   . Cancer Sister        breast  . Diabetes Daughter        plus 2 grandkids  . Stroke Son        9mo age dx, has enlarged heart  . Heart disease Son        passed away    Allergies  Allergen Reactions  . Buprenorphine Hcl Rash  . Morphine And Related Rash    Current Outpatient Medications on File Prior to Visit  Medication Sig Dispense Refill  . Accu-Chek FastClix Lancets MISC Use as directed to check blood sugar. 102 each 3  . acetaminophen (TYLENOL) 500 MG tablet Take 2 tablets (1,000 mg total) by mouth 2 (two) times daily as needed for moderate pain. 30 tablet 0  . albuterol (PROAIR HFA) 108 (90 Base) MCG/ACT inhaler Inhale 2 puffs into the lungs every 6 (six) hours as needed for wheezing or shortness of breath. 20.1 g 3  . aspirin EC 81 MG tablet Take 81 mg by mouth daily.    Marland Kitchen atorvastatin (LIPITOR) 10 MG tablet TAKE 1 TABLET BY MOUTH  DAILY 90 tablet 1  . Cholecalciferol (VITAMIN D3) 75 MCG (3000 UT) TABS Take 1 tablet by mouth daily. 90 tablet 3  . diclofenac sodium (VOLTAREN) 1 % GEL Apply 4 g topically 4 (four) times daily. (Patient taking differently: Apply 4 g topically daily at 12 noon. ) 100 g 0  . fluticasone (FLONASE) 50 MCG/ACT nasal spray INSTILL 2 SPRAYS IN EACH NOSTRIL DAILY 48 g 3  . gabapentin (NEURONTIN) 100 MG capsule TAKE 1 CAPSULE BY MOUTH  TWICE DAILY 180 capsule 1  . glucose blood (ACCU-CHEK AVIVA) test strip Use as instructed 100 each 12  . lisinopril (ZESTRIL) 5 MG tablet TAKE 1 TABLET BY MOUTH  DAILY 90 tablet 1  . loratadine (CLARITIN) 10 MG tablet Take 1  tablet (10 mg total) by mouth daily. 90 tablet 3  . meloxicam (MOBIC) 7.5 MG tablet Take 1 tablet (7.5 mg total) by mouth daily as needed for pain. 30 tablet 0  . metFORMIN (GLUCOPHAGE) 500 MG tablet TAKE 1 TABLET BY MOUTH  TWICE DAILY WITH A MEAL 180 tablet 1  . mometasone-formoterol (DULERA) 200-5 MCG/ACT AERO INHALE  ONE PUFF INTO LUNGS EVERY 12 HOURS 26.4 g 3  . Multiple Vitamins-Minerals (CENTRUM SILVER 50+WOMEN) TABS Take 1 tablet by mouth daily.    . nitroGLYCERIN (NITROSTAT) 0.4 MG SL tablet Place 1 tablet (0.4 mg total) under the tongue every 5 (five) minutes as needed for chest pain. 20 tablet 1  . omeprazole (PRILOSEC) 20 MG capsule Take 1 capsule (20 mg total) by mouth daily. 90 capsule 3  . triamcinolone cream (KENALOG) 0.5 % Apply 1 application topically daily as needed. 45 g 3  . [DISCONTINUED] gabapentin (NEURONTIN) 100 MG capsule Take 1 capsule (100 mg total) by mouth 2 (two) times daily. 180 capsule 1  . [DISCONTINUED] lisinopril (ZESTRIL) 5 MG tablet Take 1 tablet (5 mg total) by mouth daily. 90 tablet 1   No current facility-administered medications on file prior to visit.     BP (!) 152/73       Objective:   Physical Exam        Assessment & Plan:

## 2019-03-25 LAB — NOVEL CORONAVIRUS, NAA: SARS-CoV-2, NAA: NOT DETECTED

## 2019-03-29 ENCOUNTER — Other Ambulatory Visit: Payer: Self-pay

## 2019-03-29 ENCOUNTER — Telehealth (INDEPENDENT_AMBULATORY_CARE_PROVIDER_SITE_OTHER): Payer: Medicare Other | Admitting: Family

## 2019-03-29 VITALS — BP 145/71 | HR 81

## 2019-03-29 DIAGNOSIS — R195 Other fecal abnormalities: Secondary | ICD-10-CM

## 2019-03-29 DIAGNOSIS — E119 Type 2 diabetes mellitus without complications: Secondary | ICD-10-CM

## 2019-03-29 DIAGNOSIS — E785 Hyperlipidemia, unspecified: Secondary | ICD-10-CM

## 2019-03-29 DIAGNOSIS — I10 Essential (primary) hypertension: Secondary | ICD-10-CM

## 2019-03-29 NOTE — Addendum Note (Signed)
Addended by: Caffie Pinto on: 03/29/2019 02:30 PM   Modules accepted: Orders

## 2019-03-29 NOTE — Progress Notes (Signed)
Virtual Visit via Video Note  I connected with Angelica Benson on 03/29/19 at 12:20 PM EST by a video enabled telemedicine application and verified that I am speaking with the correct person using two identifiers.  Location: Patient: home Provider: home   I discussed the limitations of evaluation and management by telemedicine and the availability of in person appointments. The patient expressed understanding and agreed to proceed.  History of Present Illness:   Patient is an 85 yr old female who presents today for follow up.  DM2- reports that she checks her sugars daily. Reports that today sugar was 98 102 101 86 87 98 99 83 83 94  94  Lab Results  Component Value Date   HGBA1C 6.4 10/15/2018   HGBA1C 5.6 03/18/2018   HGBA1C 6.9 (A) 12/11/2017   Lab Results  Component Value Date   MICROALBUR <0.7 04/04/2017   LDLCALC 30 10/15/2018   CREATININE 1.05 10/15/2018   HTN- maintained on lisinopril 68m.   BP Readings from Last 3 Encounters:  03/29/19 (!) 145/71  03/23/19 (!) 152/73  12/29/18 (!) 120/57   Diarrhea- reports loose bowels.  Reports that the stool has been dark in color.  Reports that her stool is black and she is having 3-4 loose stools a day. Reports that she felt weak 2 days ago. Denies weakness today. She is taking a multivitamin but denies taking an iron supplement. She denies SOB.  Past Medical History:  Diagnosis Date  . Arthritis   . Asthma   [place]   . Chronic headaches   . Constipation   . [place]   . Hemorrhoids without complication   . Hypertension      Social History   Socioeconomic History  . Marital status: Single    Spouse name: Not on file  . Number of children: Not on file  . Years of education: Not on file  . Highest education level: Not on file  Occupational History  . Not on file  Tobacco Use  . Smoking status: Never Smoker  . Smokeless tobacco: Never  Used  Substance and Sexual Activity  . Alcohol use: No  . Drug use: No  . Sexual activity: Not Currently  Other Topics Concern  . Not on file  Social History Narrative   Lives at home by herself, aide in the am.     Has 4 daughters and 3 living sons (one son died).    Uses walker.     Divorced at age 85  Social Determinants of Health   Financial Resource Strain:   . Difficulty of Paying Living Expenses: Not on file  Food Insecurity:   . Worried About RCharity fundraiserin the Last Year: Not on file  . Ran Out of Food in the Last Year: Not on file  Transportation Needs:   . Lack of Transportation (Medical): Not on file  . Lack of Transportation (Non-Medical): Not on file  Physical Activity:   . Days of Exercise per Week: Not on file  . Minutes of Exercise per Session: Not on file  Stress:   . Feeling of Stress : Not on file  Social Connections:   . Frequency of Communication with Friends and Family: Not on file  . Frequency of Social Gatherings with Friends and Family: Not on file  . Attends Religious Services: Not on file  . Active Member of Clubs or Organizations: Not on file  .  Attends Archivist Meetings: Not on file  . Marital Status: Not on file  Intimate Partner Violence:   . Fear of Current or Ex-Partner: Not on file  . Emotionally Abused: Not on file  . Physically Abused: Not on file  . Sexually Abused: Not on file    Past Surgical History:  Procedure Laterality Date  . ABDOMINAL HYSTERECTOMY    . BACK SURGERY    . CHOLECYSTECTOMY    . KNEE SURGERY      Family History  Problem Relation Age of Onset  . Diabetes Son   . Hypertension Son   . Cancer Sister        breast  . Diabetes Daughter        plus 2 grandkids  . Stroke Son        78moage dx, has enlarged heart  . Heart disease Son        passed away    Allergies  Allergen Reactions  . Buprenorphine Hcl Rash  . Morphine And Related Rash    Current Outpatient Medications on  File Prior to Visit  Medication Sig Dispense Refill  . Accu-Chek FastClix Lancets MISC Use as directed to check blood sugar. 102 each 3  . acetaminophen (TYLENOL) 500 MG tablet Take 2 tablets (1,000 mg total) by mouth 2 (two) times daily as needed for moderate pain. 30 tablet 0  . albuterol (PROAIR HFA) 108 (90 Base) MCG/ACT inhaler Inhale 2 puffs into the lungs every 6 (six) hours as needed for wheezing or shortness of breath. 20.1 g 3  . aspirin EC 81 MG tablet Take 81 mg by mouth daily.    .Marland Kitchenatorvastatin (LIPITOR) 10 MG tablet TAKE 1 TABLET BY MOUTH  DAILY 90 tablet 1  . Cholecalciferol (VITAMIN D3) 75 MCG (3000 UT) TABS Take 1 tablet by mouth daily. 90 tablet 3  . diclofenac sodium (VOLTAREN) 1 % GEL Apply 4 g topically 4 (four) times daily. (Patient taking differently: Apply 4 g topically daily at 12 noon. ) 100 g 0  . fluticasone (FLONASE) 50 MCG/ACT nasal spray INSTILL 2 SPRAYS IN EACH NOSTRIL DAILY 48 g 3  . gabapentin (NEURONTIN) 100 MG capsule TAKE 1 CAPSULE BY MOUTH  TWICE DAILY 180 capsule 1  . glucose blood (ACCU-CHEK AVIVA) test strip Use as instructed 100 each 12  . lisinopril (ZESTRIL) 5 MG tablet TAKE 1 TABLET BY MOUTH  DAILY 90 tablet 1  . loratadine (CLARITIN) 10 MG tablet Take 1 tablet (10 mg total) by mouth daily. 90 tablet 3  . meloxicam (MOBIC) 7.5 MG tablet Take 1 tablet (7.5 mg total) by mouth daily as needed for pain. 30 tablet 0  . metFORMIN (GLUCOPHAGE) 500 MG tablet TAKE 1 TABLET BY MOUTH  TWICE DAILY WITH A MEAL 180 tablet 1  . mometasone-formoterol (DULERA) 200-5 MCG/ACT AERO INHALE ONE PUFF INTO LUNGS EVERY 12 HOURS 26.4 g 3  . Multiple Vitamins-Minerals (CENTRUM SILVER 50+WOMEN) TABS Take 1 tablet by mouth daily.    . nitroGLYCERIN (NITROSTAT) 0.4 MG SL tablet Place 1 tablet (0.4 mg total) under the tongue every 5 (five) minutes as needed for chest pain. 20 tablet 1  . omeprazole (PRILOSEC) 20 MG capsule Take 1 capsule (20 mg total) by mouth daily. 90 capsule 3  .  triamcinolone cream (KENALOG) 0.5 % Apply 1 application topically daily as needed. 45 g 3  . [DISCONTINUED] gabapentin (NEURONTIN) 100 MG capsule Take 1 capsule (100 mg total) by mouth 2 (  two) times daily. 180 capsule 1  . [DISCONTINUED] lisinopril (ZESTRIL) 5 MG tablet Take 1 tablet (5 mg total) by mouth daily. 90 tablet 1   No current facility-administered medications on file prior to visit.    BP (!) 145/71   Pulse 81      Observations/Objective:   Gen: Awake, alert, no acute distress Resp: Breathing is even and non-labored Psych: calm/pleasant demeanor Neuro: Alert and Oriented x 3, + facial symmetry, speech is clear.   Assessment and Plan:  HTN- bp is acceptable for her age.  DM2- clinically stable.  Obtain A1C.  Hyperlipidemia- maintained on statin, LDL at goal.  Dark stools- obtain cbc today. Need to rule out GI bleed.  She will be given IFOB kit for daughter to return tomorrow.    Follow Up Instructions:    I discussed the assessment and treatment plan with the patient. The patient was provided an opportunity to ask questions and all were answered. The patient agreed with the plan and demonstrated an understanding of the instructions.   The patient was advised to call back or seek an in-person evaluation if the symptoms worsen or if the condition fails to improve as anticipated.  Nance Pear, NP

## 2019-03-30 ENCOUNTER — Telehealth: Payer: Self-pay | Admitting: Family

## 2019-03-30 ENCOUNTER — Ambulatory Visit: Payer: Medicare Other | Admitting: Family

## 2019-03-30 LAB — COMPREHENSIVE METABOLIC PANEL
ALT: 9 U/L (ref 0–35)
AST: 17 U/L (ref 0–37)
Albumin: 3.9 g/dL (ref 3.5–5.2)
Alkaline Phosphatase: 61 U/L (ref 39–117)
BUN: 14 mg/dL (ref 6–23)
CO2: 28 mEq/L (ref 19–32)
Calcium: 9.6 mg/dL (ref 8.4–10.5)
Chloride: 101 mEq/L (ref 96–112)
Creatinine, Ser: 0.97 mg/dL (ref 0.40–1.20)
GFR: 65.49 mL/min (ref >60.00)
Glucose, Bld: 93 mg/dL (ref 70–99)
Potassium: 4.6 mEq/L (ref 3.5–5.1)
Sodium: 138 mEq/L (ref 135–145)
Total Bilirubin: 0.4 mg/dL (ref 0.2–1.2)
Total Protein: 6.5 g/dL (ref 6.0–8.3)

## 2019-03-30 LAB — CBC WITH DIFFERENTIAL/PLATELET
Basophils Absolute: 0.1 10*3/uL (ref 0.0–0.1)
Basophils Relative: 1.1 % (ref 0.0–3.0)
Eosinophils Absolute: 0.1 10*3/uL (ref 0.0–0.7)
Eosinophils Relative: 2.7 % (ref 0.0–5.0)
HCT: 35 % — ABNORMAL LOW (ref 36.0–46.0)
Hemoglobin: 11.5 g/dL — ABNORMAL LOW (ref 12.0–15.0)
Lymphocytes Relative: 37.7 % (ref 12.0–46.0)
Lymphs Abs: 1.8 10*3/uL (ref 0.7–4.0)
MCHC: 32.7 g/dL (ref 30.0–36.0)
MCV: 92.8 fl (ref 78.0–100.0)
Monocytes Absolute: 0.4 10*3/uL (ref 0.1–1.0)
Monocytes Relative: 8.7 % (ref 3.0–12.0)
Neutro Abs: 2.3 10*3/uL (ref 1.4–7.7)
Neutrophils Relative %: 49.8 % (ref 43.0–77.0)
Platelets: 233 10*3/uL (ref 150.0–400.0)
RBC: 3.78 Mil/uL — ABNORMAL LOW (ref 3.87–5.11)
RDW: 14.1 % (ref 11.5–15.5)
WBC: 4.7 10*3/uL (ref 4.0–10.5)

## 2019-03-30 LAB — HEMOGLOBIN A1C: Hgb A1c MFr Bld: 6 % (ref 4.6–6.5)

## 2019-03-30 NOTE — Telephone Encounter (Signed)
Blood count looks stable. I would like her to return the IFOB tests as soon as possible however.

## 2019-03-31 NOTE — Telephone Encounter (Signed)
Results given to patient's daughter and care giver.

## 2019-04-02 ENCOUNTER — Other Ambulatory Visit (INDEPENDENT_AMBULATORY_CARE_PROVIDER_SITE_OTHER): Payer: Medicare Other

## 2019-04-02 DIAGNOSIS — R195 Other fecal abnormalities: Secondary | ICD-10-CM

## 2019-04-02 LAB — FECAL OCCULT BLOOD, IMMUNOCHEMICAL: Fecal Occult Bld: POSITIVE — AB

## 2019-04-04 ENCOUNTER — Telehealth: Payer: Self-pay | Admitting: Family

## 2019-04-04 ENCOUNTER — Encounter: Payer: Self-pay | Admitting: Family

## 2019-04-04 DIAGNOSIS — R195 Other fecal abnormalities: Secondary | ICD-10-CM

## 2019-04-04 NOTE — Telephone Encounter (Signed)
Please contact pt and daughter and let them know that there is hidden blood in her stool. I would like her to meet with GI for further evaluation.   She should go to the ER if she develops multiple black stools a day, weakness or shortness of breath.

## 2019-04-05 NOTE — Telephone Encounter (Signed)
[name] advised of results and provider's advised. She understands and will keep an eye on symptoms of weakness.

## 2019-04-15 ENCOUNTER — Telehealth: Payer: Self-pay

## 2019-04-15 NOTE — Telephone Encounter (Signed)
Copied from [place] [phone]. Topic: General - Other >> Apr 15, 2019 10:20 AM Yvette Rack wrote: Reason for CRM: [facility] with [facility] called to report that from 02/28/19 to 03/30/19 patient has loss about 10 pounds. Cb# [phone]

## 2019-04-15 NOTE — Telephone Encounter (Signed)
Let's schedule a virtual visit for weight loss please.

## 2019-04-20 NOTE — Telephone Encounter (Signed)
SW daughter, Vita Barley.  [name] declines appt at this time. She states patient's appetite is good, plans to f/u with GI this month.

## 2019-05-04 ENCOUNTER — Other Ambulatory Visit (INDEPENDENT_AMBULATORY_CARE_PROVIDER_SITE_OTHER): Payer: Medicare Other

## 2019-05-04 ENCOUNTER — Other Ambulatory Visit: Payer: Self-pay

## 2019-05-04 ENCOUNTER — Telehealth (INDEPENDENT_AMBULATORY_CARE_PROVIDER_SITE_OTHER): Payer: Medicare Other | Admitting: Gastroenterology

## 2019-05-04 VITALS — Ht 62.0 in | Wt 160.0 lb

## 2019-05-04 DIAGNOSIS — R195 Other fecal abnormalities: Secondary | ICD-10-CM

## 2019-05-04 DIAGNOSIS — K921 Melena: Secondary | ICD-10-CM | POA: Diagnosis not present

## 2019-05-04 LAB — CBC WITH DIFFERENTIAL/PLATELET
Basophils Absolute: 0 10*3/uL (ref 0.0–0.1)
Basophils Relative: 0.6 % (ref 0.0–3.0)
Eosinophils Absolute: 0.1 10*3/uL (ref 0.0–0.7)
Eosinophils Relative: 2.2 % (ref 0.0–5.0)
HCT: 34.9 % — ABNORMAL LOW (ref 36.0–46.0)
Hemoglobin: 11.4 g/dL — ABNORMAL LOW (ref 12.0–15.0)
Lymphocytes Relative: 37.1 % (ref 12.0–46.0)
Lymphs Abs: 2.3 10*3/uL (ref 0.7–4.0)
MCHC: 32.8 g/dL (ref 30.0–36.0)
MCV: 91.7 fl (ref 78.0–100.0)
Monocytes Absolute: 0.6 10*3/uL (ref 0.1–1.0)
Monocytes Relative: 10.2 % (ref 3.0–12.0)
Neutro Abs: 3.1 10*3/uL (ref 1.4–7.7)
Neutrophils Relative %: 49.9 % (ref 43.0–77.0)
Platelets: 238 10*3/uL (ref 150.0–400.0)
RBC: 3.8 Mil/uL — ABNORMAL LOW (ref 3.87–5.11)
RDW: 14.5 % (ref 11.5–15.5)
WBC: 6.3 10*3/uL (ref 4.0–10.5)

## 2019-05-04 LAB — COMPREHENSIVE METABOLIC PANEL
ALT: 10 U/L (ref 0–35)
AST: 21 U/L (ref 0–37)
Albumin: 3.8 g/dL (ref 3.5–5.2)
Alkaline Phosphatase: 58 U/L (ref 39–117)
BUN: 18 mg/dL (ref 6–23)
CO2: 27 mEq/L (ref 19–32)
Calcium: 9.5 mg/dL (ref 8.4–10.5)
Chloride: 104 mEq/L (ref 96–112)
Creatinine, Ser: 0.95 mg/dL (ref 0.40–1.20)
GFR: 67.07 mL/min (ref >60.00)
Glucose, Bld: 95 mg/dL (ref 70–99)
Potassium: 4.4 mEq/L (ref 3.5–5.1)
Sodium: 139 mEq/L (ref 135–145)
Total Bilirubin: 0.3 mg/dL (ref 0.2–1.2)
Total Protein: 6.8 g/dL (ref 6.0–8.3)

## 2019-05-04 MED ORDER — OMEPRAZOLE 20 MG PO CPDR: 20.0000 mg | DELAYED_RELEASE_CAPSULE | Freq: Two times a day (BID) | ORAL | 6 refills | Status: DC

## 2019-05-04 NOTE — Patient Instructions (Signed)
If you are age 84 or older, your body mass index should be between 23-30. Your Body mass index is 29.26 kg/m. If this is out of the aforementioned range listed, please consider follow up with your Primary Care Provider.  If you are age 63 or younger, your body mass index should be between 19-25. Your Body mass index is 29.26 kg/m. If this is out of the aformentioned range listed, please consider follow up with your Primary Care Provider.   Please go to the lab at [facility] ([place] You will need to go to level "B", you do not need an appointment for this. Hours available are 7:30 am - 4:30 pm.   Stop Meloxicam.   Thank you,  Dr. Jackquline Denmark

## 2019-05-04 NOTE — Progress Notes (Signed)
Chief Complaint:   Referring Provider:  Debbrah Alar, NP      ASSESSMENT AND PLAN;   #1. ? Melena (difficult to assess on televisit). Hb 11.5 (03/2019) which is her baseline.  She has H/O chronic anemia in past.  #2.  Heme pos stools.  Plan: -CBC, CMP ASAP -Increase omeprazole 20 mg po bid  -Stop meloxicam for now -RTC next week preferably in person. -Discussed with patient and patient's daughter.  If she starts having increasing dizziness, then we should seek emergency care. -Willing to get EGD performed if needed.  Wants to avoid colonoscopy if possible.   HPI:    Angelica Benson is a 86 y.o. female  Tele video visit Accompanied by her daughter Has been having dark stools x 2 weeks with some dizziness.  No intake of Pepto-Bismol/iron tablets.  Has been having looser bowel movements 2 to 3/day. " Does not smell a whole lot".   She has been on meloxicam for last 2 years.  No other over-the-counter nonsteroidals.  She was found to have heme positive stools previously.  Most recent hemoglobin March 29, 2019 was 11.5.  No nausea, vomiting, heartburn, regurgitation, odynophagia or dysphagia.  No significant  constipation.  No unintentional weight loss. No abdominal pain.  Previous 2D echo EF 60 to 65% December 2016.  Past GI procedures: -EGD 2003 Dr. Amedeo Plenty: Gastric polyps s/p polypectomy Bx-hyperplastic. -Colonoscopy 2003 Dr. Amedeo Plenty: Negative except for small hyperplastic polyps. -MRI abdomen with and without contrast 09/2017: Atypical hemangiomas liver. Past Medical History:  Diagnosis Date  . Arthritis   . Asthma   . CHF (congestive heart failure) ([place])   . Chronic headaches   . Constipation   . Diabetes mellitus without complication ([place])   . Hemorrhoids without complication   . Hypertension     Past Surgical History:  Procedure Laterality Date  . ABDOMINAL HYSTERECTOMY    . BACK SURGERY    . CHOLECYSTECTOMY    . COLONOSCOPY  09/04/2001   Two  rectal polyps. Small internal hemorrhoids  . ESOPHAGOGASTRODUODENOSCOPY  09/04/2001   Two apparent antral polyps with one possible gastroesophageal juction polyp versus enlarged fold or focal gastritis  . KNEE SURGERY      Family History  Problem Relation Age of Onset  . Diabetes Son   . Hypertension Son   . Cancer Sister        breast  . Diabetes Daughter        plus 2 grandkids  . Stroke Son        29mo age dx, has enlarged heart  . Heart disease Son        passed away    Social History   Tobacco Use  . Smoking status: Never Smoker  . Smokeless tobacco: Never Used  Substance Use Topics  . Alcohol use: No  . Drug use: No    Current Outpatient Medications  Medication Sig Dispense Refill  . Accu-Chek FastClix Lancets MISC Use as directed to check blood sugar. 102 each 3  . acetaminophen (TYLENOL) 500 MG tablet Take 2 tablets (1,000 mg total) by mouth 2 (two) times daily as needed for moderate pain. 30 tablet 0  . albuterol (PROAIR HFA) 108 (90 Base) MCG/ACT inhaler Inhale 2 puffs into the lungs every 6 (six) hours as needed for wheezing or shortness of breath. 20.1 g 3  . aspirin EC 81 MG tablet Take 81 mg by mouth daily.    Marland Kitchen atorvastatin (LIPITOR) 10 MG  tablet TAKE 1 TABLET BY MOUTH  DAILY 90 tablet 1  . Cholecalciferol (VITAMIN D3) 75 MCG (3000 UT) TABS Take 1 tablet by mouth daily. 90 tablet 3  . diclofenac sodium (VOLTAREN) 1 % GEL Apply 4 g topically 4 (four) times daily. (Patient taking differently: Apply 4 g topically as needed. ) 100 g 0  . fluticasone (FLONASE) 50 MCG/ACT nasal spray INSTILL 2 SPRAYS IN EACH NOSTRIL DAILY 48 g 3  . gabapentin (NEURONTIN) 100 MG capsule TAKE 1 CAPSULE BY MOUTH  TWICE DAILY (Patient taking differently: Take 100 mg by mouth as needed. ) 180 capsule 1  . glucose blood (ACCU-CHEK AVIVA) test strip Use as instructed 100 each 12  . lisinopril (ZESTRIL) 5 MG tablet TAKE 1 TABLET BY MOUTH  DAILY 90 tablet 1  . loratadine (CLARITIN) 10 MG  tablet Take 1 tablet (10 mg total) by mouth daily. (Patient taking differently: Take 10 mg by mouth daily as needed. ) 90 tablet 3  . meloxicam (MOBIC) 7.5 MG tablet Take 1 tablet (7.5 mg total) by mouth daily as needed for pain. 30 tablet 0  . metFORMIN (GLUCOPHAGE) 500 MG tablet TAKE 1 TABLET BY MOUTH  TWICE DAILY WITH A MEAL 180 tablet 1  . mometasone-formoterol (DULERA) 200-5 MCG/ACT AERO INHALE ONE PUFF INTO LUNGS EVERY 12 HOURS 26.4 g 3  . Multiple Vitamins-Minerals (CENTRUM SILVER 50+WOMEN) TABS Take 1 tablet by mouth daily.    . nitroGLYCERIN (NITROSTAT) 0.4 MG SL tablet Place 1 tablet (0.4 mg total) under the tongue every 5 (five) minutes as needed for chest pain. (Patient not taking: Reported on 05/03/2019) 20 tablet 1  . omeprazole (PRILOSEC) 20 MG capsule Take 1 capsule (20 mg total) by mouth daily. 90 capsule 3  . triamcinolone cream (KENALOG) 0.5 % Apply 1 application topically daily as needed. 45 g 3   No current facility-administered medications for this visit.    Allergies  Allergen Reactions  . Buprenorphine Hcl Rash  . Morphine And Related Rash    Review of Systems:  Constitutional: Denies fever, chills, diaphoresis, appetite change and fatigue.  HEENT: Denies photophobia, eye pain, redness, hearing loss, ear pain, congestion, sore throat, rhinorrhea, sneezing, mouth sores, neck pain, neck stiffness and tinnitus.   Respiratory: Denies SOB, DOE, cough, chest tightness,  and wheezing.   Cardiovascular: Denies chest pain, palpitations and leg swelling.  Genitourinary: Denies dysuria, urgency, frequency, hematuria, flank pain and difficulty urinating.  Musculoskeletal: Denies myalgias, back pain, joint swelling, arthralgias and gait problem.  Skin: No rash.  Neurological: Denies dizziness, seizures, syncope, weakness, light-headedness, numbness and headaches.  Hematological: Denies adenopathy. Easy bruising, personal or family bleeding history  Psychiatric/Behavioral: No  anxiety or depression     Physical Exam:    Ht 5\' 2"  (1.575 m)   Wt 160 lb (72.6 kg)   BMI 29.26 kg/m  Wt Readings from Last 3 Encounters:  05/04/19 160 lb (72.6 kg)  12/29/18 169 lb (76.7 kg)  09/15/18 184 lb 15.5 oz (83.9 kg)   Constitutional:  Well-developed, in no acute distress. Psychiatric: Normal mood and affect. Behavior is normal. Video visit  Data Reviewed: I have personally reviewed following labs and imaging studies  CBC: CBC Latest Ref Rng & Units 03/29/2019 10/15/2018 09/15/2018  WBC 4.0 - 10.5 K/uL 4.7 4.3 5.0  Hemoglobin 12.0 - 15.0 g/dL 11.5(L) 10.9(L) 11.0(L)  Hematocrit 36.0 - 46.0 % 35.0(L) 32.9(L) 35.0(L)  Platelets 150.0 - 400.0 K/uL 233.0 196.0 166    CMP: CMP  Latest Ref Rng & Units 03/29/2019 10/15/2018 09/15/2018  Glucose 70 - 99 mg/dL 93 104(H) 101(H)  BUN 6 - 23 mg/dL 14 20 15   Creatinine 0.40 - 1.20 mg/dL 0.97 1.05 0.78  Sodium 135 - 145 mEq/L 138 139 143  Potassium 3.5 - 5.1 mEq/L 4.6 4.3 4.3  Chloride 96 - 112 mEq/L 101 102 110  CO2 19 - 32 mEq/L 28 29 25   Calcium 8.4 - 10.5 mg/dL 9.6 9.4 8.3(L)  Total Protein 6.0 - 8.3 g/dL 6.5 6.4 -  Total Bilirubin 0.2 - 1.2 mg/dL 0.4 0.5 -  Alkaline Phos 39 - 117 U/L 61 63 -  AST 0 - 37 U/L 17 14 -  ALT 0 - 35 U/L 9 6 -   I connected with  Angelica Benson on 05/04/19 by a video enabled telemedicine application and verified that I am speaking with the correct person using two identifiers.   I discussed the limitations of evaluation and management by telemedicine. The patient expressed understanding and agreed to proceed.  Records reviewed, labs reviewed, time spent on televisit.   Carmell Austria, MD 05/04/2019, 1:47 PM  Cc: Debbrah Alar, NP

## 2019-05-24 DIAGNOSIS — I10 Essential (primary) hypertension: Secondary | ICD-10-CM | POA: Diagnosis not present

## 2019-05-24 DIAGNOSIS — M0540 Rheumatoid myopathy with rheumatoid arthritis of unspecified site: Secondary | ICD-10-CM | POA: Diagnosis not present

## 2019-05-24 DIAGNOSIS — E119 Type 2 diabetes mellitus without complications: Secondary | ICD-10-CM | POA: Diagnosis not present

## 2019-05-24 DIAGNOSIS — I509 Heart failure, unspecified: Secondary | ICD-10-CM | POA: Diagnosis not present

## 2019-05-24 DIAGNOSIS — J4522 Mild intermittent asthma with status asthmaticus: Secondary | ICD-10-CM | POA: Diagnosis not present

## 2019-06-11 DIAGNOSIS — I10 Essential (primary) hypertension: Secondary | ICD-10-CM | POA: Diagnosis not present

## 2019-06-11 DIAGNOSIS — E119 Type 2 diabetes mellitus without complications: Secondary | ICD-10-CM | POA: Diagnosis not present

## 2019-06-11 DIAGNOSIS — M0540 Rheumatoid myopathy with rheumatoid arthritis of unspecified site: Secondary | ICD-10-CM | POA: Diagnosis not present

## 2019-06-11 DIAGNOSIS — I509 Heart failure, unspecified: Secondary | ICD-10-CM | POA: Diagnosis not present

## 2019-06-11 DIAGNOSIS — J4522 Mild intermittent asthma with status asthmaticus: Secondary | ICD-10-CM | POA: Diagnosis not present

## 2019-06-15 ENCOUNTER — Encounter: Payer: Self-pay | Admitting: Podiatry

## 2019-06-15 ENCOUNTER — Other Ambulatory Visit: Payer: Self-pay

## 2019-06-15 ENCOUNTER — Ambulatory Visit (INDEPENDENT_AMBULATORY_CARE_PROVIDER_SITE_OTHER): Payer: Medicare Other | Admitting: Podiatry

## 2019-06-15 VITALS — Temp 97.2°F

## 2019-06-15 DIAGNOSIS — M79675 Pain in left toe(s): Secondary | ICD-10-CM

## 2019-06-15 DIAGNOSIS — M79674 Pain in right toe(s): Secondary | ICD-10-CM | POA: Diagnosis not present

## 2019-06-15 DIAGNOSIS — E119 Type 2 diabetes mellitus without complications: Secondary | ICD-10-CM | POA: Diagnosis not present

## 2019-06-15 DIAGNOSIS — B351 Tinea unguium: Secondary | ICD-10-CM

## 2019-06-15 NOTE — Progress Notes (Signed)
Patient ID: Angelica Benson, female   DOB: 1928/05/31, 86 y.o.   MRN: [id number] Complaint:  Visit Type: Patient returns to my office for continued preventative foot care services. Complaint: Patient states" my nails have grown long and thick and become painful to walk and wear shoes" Patient has been diagnosed with DM with neuropathy.. The patient presents for preventative foot care services. No changes to ROS  Podiatric Exam: Vascular: dorsalis pedis  are palpable bilateral. Posterior tibial pulses are absent. Capillary return is immediate. Temperature gradient is WNL. Skin turgor WNL  Sensorium: Diminished  Semmes Weinstein monofilament test. Normal tactile sensation bilaterally. Nail Exam: Pt has thick disfigured discolored nails with subungual debris noted bilateral entire nail hallux through fifth toenails Ulcer Exam: There is no evidence of ulcer or pre-ulcerative changes or infection. Orthopedic Exam: Muscle tone and strength are WNL. No limitations in general ROM. No crepitus or effusions noted. Foot type and digits show no abnormalities. Bony prominences are unremarkable. Skin: No Porokeratosis. No infection or ulcers  Diagnosis:  Onychomycosis, , Pain in right toe, pain in left toes  Treatment & Plan Procedures and Treatment: Consent by patient was obtained for treatment procedures. The patient understood the discussion of treatment and procedures well. All questions were answered thoroughly reviewed. Debridement of mycotic and hypertrophic toenails, 1 through 5 bilateral and clearing of subungual debris. No ulceration, no infection noted.   Return Visit-Office Procedure: Patient instructed to return to the office for a follow up visit 3 months for continued evaluation and treatment.  Gardiner Barefoot DPM

## 2019-06-25 DIAGNOSIS — J4522 Mild intermittent asthma with status asthmaticus: Secondary | ICD-10-CM | POA: Diagnosis not present

## 2019-06-25 DIAGNOSIS — I509 Heart failure, unspecified: Secondary | ICD-10-CM | POA: Diagnosis not present

## 2019-06-25 DIAGNOSIS — E119 Type 2 diabetes mellitus without complications: Secondary | ICD-10-CM | POA: Diagnosis not present

## 2019-06-25 DIAGNOSIS — I10 Essential (primary) hypertension: Secondary | ICD-10-CM | POA: Diagnosis not present

## 2019-06-25 DIAGNOSIS — M0540 Rheumatoid myopathy with rheumatoid arthritis of unspecified site: Secondary | ICD-10-CM | POA: Diagnosis not present

## 2019-07-03 ENCOUNTER — Other Ambulatory Visit: Payer: Self-pay | Admitting: Family

## 2019-07-09 ENCOUNTER — Telehealth: Payer: Self-pay | Admitting: Endocrinology

## 2019-07-09 DIAGNOSIS — I509 Heart failure, unspecified: Secondary | ICD-10-CM | POA: Diagnosis not present

## 2019-07-09 DIAGNOSIS — E119 Type 2 diabetes mellitus without complications: Secondary | ICD-10-CM | POA: Diagnosis not present

## 2019-07-09 DIAGNOSIS — I10 Essential (primary) hypertension: Secondary | ICD-10-CM | POA: Diagnosis not present

## 2019-07-09 DIAGNOSIS — J4522 Mild intermittent asthma with status asthmaticus: Secondary | ICD-10-CM | POA: Diagnosis not present

## 2019-07-09 DIAGNOSIS — M0540 Rheumatoid myopathy with rheumatoid arthritis of unspecified site: Secondary | ICD-10-CM | POA: Diagnosis not present

## 2019-07-09 NOTE — Telephone Encounter (Signed)
She is overdue for follow-up.  Need to schedule first available appointment with labs before visit if possible otherwise same day

## 2019-07-09 NOTE — Telephone Encounter (Signed)
Angelica Benson called to see if it was time to schedule a visit with Dr Dwyane Dee - notes from last visit's AVS and chart do not show a return visit time frame.  Please review and advise if patient needs appointment if so I can call her back if no appointment necessary patients daughter would like information regarding why not.

## 2019-07-13 NOTE — Telephone Encounter (Signed)
LMTCB

## 2019-07-19 DIAGNOSIS — M0540 Rheumatoid myopathy with rheumatoid arthritis of unspecified site: Secondary | ICD-10-CM | POA: Diagnosis not present

## 2019-07-19 DIAGNOSIS — I10 Essential (primary) hypertension: Secondary | ICD-10-CM | POA: Diagnosis not present

## 2019-07-19 DIAGNOSIS — I509 Heart failure, unspecified: Secondary | ICD-10-CM | POA: Diagnosis not present

## 2019-07-19 DIAGNOSIS — J4522 Mild intermittent asthma with status asthmaticus: Secondary | ICD-10-CM | POA: Diagnosis not present

## 2019-07-19 DIAGNOSIS — E119 Type 2 diabetes mellitus without complications: Secondary | ICD-10-CM | POA: Diagnosis not present

## 2019-07-19 NOTE — Telephone Encounter (Signed)
LMTCB

## 2019-07-23 DIAGNOSIS — E119 Type 2 diabetes mellitus without complications: Secondary | ICD-10-CM | POA: Diagnosis not present

## 2019-07-23 DIAGNOSIS — M0540 Rheumatoid myopathy with rheumatoid arthritis of unspecified site: Secondary | ICD-10-CM | POA: Diagnosis not present

## 2019-07-23 DIAGNOSIS — I509 Heart failure, unspecified: Secondary | ICD-10-CM | POA: Diagnosis not present

## 2019-07-23 DIAGNOSIS — I10 Essential (primary) hypertension: Secondary | ICD-10-CM | POA: Diagnosis not present

## 2019-07-23 DIAGNOSIS — J4522 Mild intermittent asthma with status asthmaticus: Secondary | ICD-10-CM | POA: Diagnosis not present

## 2019-08-05 ENCOUNTER — Other Ambulatory Visit: Payer: Self-pay | Admitting: Family

## 2019-08-06 DIAGNOSIS — J4522 Mild intermittent asthma with status asthmaticus: Secondary | ICD-10-CM | POA: Diagnosis not present

## 2019-08-06 DIAGNOSIS — I10 Essential (primary) hypertension: Secondary | ICD-10-CM | POA: Diagnosis not present

## 2019-08-06 DIAGNOSIS — E119 Type 2 diabetes mellitus without complications: Secondary | ICD-10-CM | POA: Diagnosis not present

## 2019-08-06 DIAGNOSIS — M0540 Rheumatoid myopathy with rheumatoid arthritis of unspecified site: Secondary | ICD-10-CM | POA: Diagnosis not present

## 2019-08-06 DIAGNOSIS — I509 Heart failure, unspecified: Secondary | ICD-10-CM | POA: Diagnosis not present

## 2019-08-11 ENCOUNTER — Telehealth: Payer: Self-pay | Admitting: Family

## 2019-08-11 NOTE — Progress Notes (Signed)
Patient daughter, Vita Barley scheduled a call back, she was at a dr appt. Will call me back later.      This note is not being shared with the patient for the following reason: To respect privacy (The patient or proxy has requested that the information not be shared).    Raynicia Dukes [employer] Scheduler

## 2019-08-16 DIAGNOSIS — I509 Heart failure, unspecified: Secondary | ICD-10-CM | POA: Diagnosis not present

## 2019-08-16 DIAGNOSIS — M0540 Rheumatoid myopathy with rheumatoid arthritis of unspecified site: Secondary | ICD-10-CM | POA: Diagnosis not present

## 2019-08-16 DIAGNOSIS — J4522 Mild intermittent asthma with status asthmaticus: Secondary | ICD-10-CM | POA: Diagnosis not present

## 2019-08-16 DIAGNOSIS — E119 Type 2 diabetes mellitus without complications: Secondary | ICD-10-CM | POA: Diagnosis not present

## 2019-08-16 DIAGNOSIS — I10 Essential (primary) hypertension: Secondary | ICD-10-CM | POA: Diagnosis not present

## 2019-08-20 DIAGNOSIS — M0540 Rheumatoid myopathy with rheumatoid arthritis of unspecified site: Secondary | ICD-10-CM | POA: Diagnosis not present

## 2019-08-20 DIAGNOSIS — J4522 Mild intermittent asthma with status asthmaticus: Secondary | ICD-10-CM | POA: Diagnosis not present

## 2019-08-20 DIAGNOSIS — E119 Type 2 diabetes mellitus without complications: Secondary | ICD-10-CM | POA: Diagnosis not present

## 2019-08-20 DIAGNOSIS — I509 Heart failure, unspecified: Secondary | ICD-10-CM | POA: Diagnosis not present

## 2019-08-20 DIAGNOSIS — I10 Essential (primary) hypertension: Secondary | ICD-10-CM | POA: Diagnosis not present

## 2019-08-30 ENCOUNTER — Encounter: Payer: Self-pay | Admitting: Family

## 2019-08-31 ENCOUNTER — Telehealth: Payer: Self-pay | Admitting: Family

## 2019-08-31 NOTE — Telephone Encounter (Signed)
Patient daughter came into office to drop off paper work for [employer] . Please inform daughter Angelica Benson once form is completed [phone] . Paper work was left in [employer] .

## 2019-09-02 NOTE — Telephone Encounter (Signed)
Form placed on provider's box

## 2019-09-06 DIAGNOSIS — E119 Type 2 diabetes mellitus without complications: Secondary | ICD-10-CM | POA: Diagnosis not present

## 2019-09-06 DIAGNOSIS — I10 Essential (primary) hypertension: Secondary | ICD-10-CM | POA: Diagnosis not present

## 2019-09-06 DIAGNOSIS — I509 Heart failure, unspecified: Secondary | ICD-10-CM | POA: Diagnosis not present

## 2019-09-06 DIAGNOSIS — M0540 Rheumatoid myopathy with rheumatoid arthritis of unspecified site: Secondary | ICD-10-CM | POA: Diagnosis not present

## 2019-09-06 DIAGNOSIS — J4522 Mild intermittent asthma with status asthmaticus: Secondary | ICD-10-CM | POA: Diagnosis not present

## 2019-09-14 DIAGNOSIS — E119 Type 2 diabetes mellitus without complications: Secondary | ICD-10-CM | POA: Diagnosis not present

## 2019-09-14 DIAGNOSIS — J4522 Mild intermittent asthma with status asthmaticus: Secondary | ICD-10-CM | POA: Diagnosis not present

## 2019-09-14 DIAGNOSIS — M0540 Rheumatoid myopathy with rheumatoid arthritis of unspecified site: Secondary | ICD-10-CM | POA: Diagnosis not present

## 2019-09-14 DIAGNOSIS — I509 Heart failure, unspecified: Secondary | ICD-10-CM | POA: Diagnosis not present

## 2019-09-14 DIAGNOSIS — I10 Essential (primary) hypertension: Secondary | ICD-10-CM | POA: Diagnosis not present

## 2019-09-15 ENCOUNTER — Encounter: Payer: Self-pay | Admitting: Podiatry

## 2019-09-15 ENCOUNTER — Other Ambulatory Visit: Payer: Self-pay

## 2019-09-15 ENCOUNTER — Ambulatory Visit (INDEPENDENT_AMBULATORY_CARE_PROVIDER_SITE_OTHER): Payer: Medicare Other | Admitting: Podiatry

## 2019-09-15 DIAGNOSIS — E119 Type 2 diabetes mellitus without complications: Secondary | ICD-10-CM

## 2019-09-15 DIAGNOSIS — M79674 Pain in right toe(s): Secondary | ICD-10-CM | POA: Diagnosis not present

## 2019-09-15 DIAGNOSIS — M79675 Pain in left toe(s): Secondary | ICD-10-CM | POA: Diagnosis not present

## 2019-09-15 DIAGNOSIS — B351 Tinea unguium: Secondary | ICD-10-CM

## 2019-09-15 NOTE — Progress Notes (Addendum)
This patient returns to my office for at risk foot care.  This patient requires this care by a professional since this patient will be at risk due to having  Type 2 diabetes.  This patient is unable to cut nails herself since the patient cannot reach her nails.These nails are painful walking and wearing shoes.   Patient presents to the office with her daughter. This patient presents for at risk foot care today.  General Appearance  Alert, conversant and in no acute stress.  Vascular  Dorsalis pedis  are palpable  bilaterally.  Posterior tibial pulses are not palpable  B/L.  Capillary return is within normal limits  bilaterally. Temperature is within normal limits  bilaterally.  Neurologic  Senn-Weinstein monofilament wire test diminished  bilaterally. Muscle power within normal limits bilaterally.  Nails Thick disfigured discolored nails with subungual debris  from hallux to fifth toes bilaterally. No evidence of bacterial infection or drainage bilaterally.  Orthopedic  No limitations of motion  feet .  No crepitus or effusions noted.  No bony pathology or digital deformities noted.  Skin  normotropic skin with no porokeratosis noted bilaterally.  No signs of infections or ulcers noted.     Onychomycosis  Pain in right toes  Pain in left toes  Consent was obtained for treatment procedures.   Mechanical debridement of nails 1-5  bilaterally performed with a nail nipper.  Filed with dremel without incident.    Return office visit   3 months                   Told patient to return for periodic foot care and evaluation due to potential at risk complications.   Dierre Crevier DPM  

## 2019-09-20 DIAGNOSIS — M0540 Rheumatoid myopathy with rheumatoid arthritis of unspecified site: Secondary | ICD-10-CM | POA: Diagnosis not present

## 2019-09-20 DIAGNOSIS — I10 Essential (primary) hypertension: Secondary | ICD-10-CM | POA: Diagnosis not present

## 2019-09-20 DIAGNOSIS — J4522 Mild intermittent asthma with status asthmaticus: Secondary | ICD-10-CM | POA: Diagnosis not present

## 2019-09-20 DIAGNOSIS — I509 Heart failure, unspecified: Secondary | ICD-10-CM | POA: Diagnosis not present

## 2019-09-20 DIAGNOSIS — E119 Type 2 diabetes mellitus without complications: Secondary | ICD-10-CM | POA: Diagnosis not present

## 2019-10-08 DIAGNOSIS — E119 Type 2 diabetes mellitus without complications: Secondary | ICD-10-CM | POA: Diagnosis not present

## 2019-10-08 DIAGNOSIS — I10 Essential (primary) hypertension: Secondary | ICD-10-CM | POA: Diagnosis not present

## 2019-10-08 DIAGNOSIS — J4522 Mild intermittent asthma with status asthmaticus: Secondary | ICD-10-CM | POA: Diagnosis not present

## 2019-10-08 DIAGNOSIS — I509 Heart failure, unspecified: Secondary | ICD-10-CM | POA: Diagnosis not present

## 2019-10-08 DIAGNOSIS — M0540 Rheumatoid myopathy with rheumatoid arthritis of unspecified site: Secondary | ICD-10-CM | POA: Diagnosis not present

## 2019-10-18 DIAGNOSIS — I10 Essential (primary) hypertension: Secondary | ICD-10-CM | POA: Diagnosis not present

## 2019-10-18 DIAGNOSIS — M0540 Rheumatoid myopathy with rheumatoid arthritis of unspecified site: Secondary | ICD-10-CM | POA: Diagnosis not present

## 2019-10-18 DIAGNOSIS — I509 Heart failure, unspecified: Secondary | ICD-10-CM | POA: Diagnosis not present

## 2019-10-18 DIAGNOSIS — J4522 Mild intermittent asthma with status asthmaticus: Secondary | ICD-10-CM | POA: Diagnosis not present

## 2019-10-18 DIAGNOSIS — E119 Type 2 diabetes mellitus without complications: Secondary | ICD-10-CM | POA: Diagnosis not present

## 2019-10-21 DIAGNOSIS — I509 Heart failure, unspecified: Secondary | ICD-10-CM | POA: Diagnosis not present

## 2019-10-21 DIAGNOSIS — M0540 Rheumatoid myopathy with rheumatoid arthritis of unspecified site: Secondary | ICD-10-CM | POA: Diagnosis not present

## 2019-10-21 DIAGNOSIS — E119 Type 2 diabetes mellitus without complications: Secondary | ICD-10-CM | POA: Diagnosis not present

## 2019-10-21 DIAGNOSIS — J4522 Mild intermittent asthma with status asthmaticus: Secondary | ICD-10-CM | POA: Diagnosis not present

## 2019-10-21 DIAGNOSIS — I10 Essential (primary) hypertension: Secondary | ICD-10-CM | POA: Diagnosis not present

## 2019-10-26 ENCOUNTER — Encounter: Payer: Self-pay | Admitting: Family

## 2019-10-26 ENCOUNTER — Ambulatory Visit (INDEPENDENT_AMBULATORY_CARE_PROVIDER_SITE_OTHER): Payer: Medicare Other | Admitting: Family

## 2019-10-26 ENCOUNTER — Other Ambulatory Visit: Payer: Self-pay

## 2019-10-26 VITALS — BP 140/58 | HR 78 | Temp 97.8°F | Resp 17 | Ht 62.0 in | Wt 160.0 lb

## 2019-10-26 DIAGNOSIS — E119 Type 2 diabetes mellitus without complications: Secondary | ICD-10-CM

## 2019-10-26 DIAGNOSIS — G309 Alzheimer's disease, unspecified: Secondary | ICD-10-CM

## 2019-10-26 DIAGNOSIS — I10 Essential (primary) hypertension: Secondary | ICD-10-CM

## 2019-10-26 DIAGNOSIS — R413 Other amnesia: Secondary | ICD-10-CM

## 2019-10-26 NOTE — Patient Instructions (Signed)
You should be contacted about scheduling your CT scan. Please complete lab work prior to leaving.

## 2019-10-26 NOTE — Progress Notes (Signed)
Subjective:    Patient ID: Angelica Benson, female    DOB: 1929-01-07, 86 y.o.   MRN: [id number]  HPI  Patient is an 86 year old female who presents today with her daughter due to complaints of memory concerns. She is still living independently.  Enjoys staying busy.  Reads a lot.  Does not sleep well at night.  Other family memers are concerned about her memory.  Daughter reports that the patient will often repeat herself. About the same as 6 months ago.   DM2-  Lab Results  Component Value Date   HGBA1C 6.0 03/29/2019   HGBA1C 6.4 10/15/2018   HGBA1C 5.6 03/18/2018   Lab Results  Component Value Date   MICROALBUR <0.7 04/04/2017   LDLCALC 30 10/15/2018   CREATININE 0.95 05/04/2019   HTN-  BP Readings from Last 3 Encounters:  10/26/19 (!) 140/58  03/29/19 (!) 145/71  03/23/19 (!) 152/73   CHF-  Wt Readings from Last 3 Encounters:  10/26/19 160 lb (72.6 kg)  05/04/19 160 lb (72.6 kg)  12/29/18 169 lb (76.7 kg)    Review of Systems    see HPI  Past Medical History:  Diagnosis Date  . Arthritis   . Asthma   . CHF (congestive heart failure) ([place])   . Chronic headaches   . Constipation   . Diabetes mellitus without complication ([place])   . Hemorrhoids without complication   . Hypertension      Social History   Socioeconomic History  . Marital status: Single    Spouse name: Not on file  . Number of children: Not on file  . Years of education: Not on file  . Highest education level: Not on file  Occupational History  . Not on file  Tobacco Use  . Smoking status: Never Smoker  . Smokeless tobacco: Never Used  Vaping Use  . Vaping Use: Never used  Substance and Sexual Activity  . Alcohol use: No  . Drug use: No  . Sexual activity: Not Currently  Other Topics Concern  . Not on file  Social History Narrative   Lives at home by herself, aide in the am.     Has 4 daughters and 3 living sons (one son died).    Uses walker.     Divorced at age 25    Social Determinants of Health   Financial Resource Strain:   . Difficulty of Paying Living Expenses:   Food Insecurity:   . Worried About Charity fundraiser in the Last Year:   . Arboriculturist in the Last Year:   Transportation Needs:   . Film/video editor (Medical):   Marland Kitchen Lack of Transportation (Non-Medical):   Physical Activity:   . Days of Exercise per Week:   . Minutes of Exercise per Session:   Stress:   . Feeling of Stress :   Social Connections:   . Frequency of Communication with Friends and Family:   . Frequency of Social Gatherings with Friends and Family:   . Attends Religious Services:   . Active Member of Clubs or Organizations:   . Attends Archivist Meetings:   Marland Kitchen Marital Status:   Intimate Partner Violence:   . Fear of Current or Ex-Partner:   . Emotionally Abused:   Marland Kitchen Physically Abused:   . Sexually Abused:     Past Surgical History:  Procedure Laterality Date  . ABDOMINAL HYSTERECTOMY    . BACK SURGERY    .  CHOLECYSTECTOMY    . COLONOSCOPY  09/04/2001   Two rectal polyps. Small internal hemorrhoids  . ESOPHAGOGASTRODUODENOSCOPY  09/04/2001   Two apparent antral polyps with one possible gastroesophageal juction polyp versus enlarged fold or focal gastritis  . KNEE SURGERY      Family History  Problem Relation Age of Onset  . Diabetes Son   . Hypertension Son   . Cancer Sister        breast  . Diabetes Daughter        plus 2 grandkids  . Stroke Son        75mo age dx, has enlarged heart  . Heart disease Son        passed away    Allergies  Allergen Reactions  . Buprenorphine Hcl Rash  . Morphine And Related Rash    Current Outpatient Medications on File Prior to Visit  Medication Sig Dispense Refill  . Accu-Chek FastClix Lancets MISC Use as directed to check blood sugar. 102 each 3  . acetaminophen (TYLENOL) 500 MG tablet Take 2 tablets (1,000 mg total) by mouth 2 (two) times daily as needed for moderate pain. 30  tablet 0  . albuterol (PROAIR HFA) 108 (90 Base) MCG/ACT inhaler Inhale 2 puffs into the lungs every 6 (six) hours as needed for wheezing or shortness of breath. 20.1 g 3  . aspirin EC 81 MG tablet Take 81 mg by mouth daily.    Marland Kitchen atorvastatin (LIPITOR) 10 MG tablet TAKE 1 TABLET BY MOUTH  DAILY 90 tablet 3  . Cholecalciferol (VITAMIN D3) 75 MCG (3000 UT) TABS Take 1 tablet by mouth daily. 90 tablet 3  . diclofenac sodium (VOLTAREN) 1 % GEL Apply 4 g topically 4 (four) times daily. (Patient taking differently: Apply 4 g topically as needed. ) 100 g 0  . fluticasone (FLONASE) 50 MCG/ACT nasal spray INSTILL 2 SPRAYS IN EACH NOSTRIL DAILY 48 g 3  . gabapentin (NEURONTIN) 100 MG capsule     . lisinopril (ZESTRIL) 5 MG tablet TAKE 1 TABLET BY MOUTH  DAILY 90 tablet 3  . loratadine (CLARITIN) 10 MG tablet Take 1 tablet (10 mg total) by mouth daily. (Patient taking differently: Take 10 mg by mouth daily as needed. ) 90 tablet 3  . metFORMIN (GLUCOPHAGE) 500 MG tablet TAKE 1 TABLET BY MOUTH  TWICE DAILY WITH A MEAL 180 tablet 1  . mometasone-formoterol (DULERA) 200-5 MCG/ACT AERO INHALE ONE PUFF INTO LUNGS EVERY 12 HOURS 26.4 g 3  . Multiple Vitamins-Minerals (CENTRUM SILVER 50+WOMEN) TABS Take 1 tablet by mouth daily.    . nitroGLYCERIN (NITROSTAT) 0.4 MG SL tablet Place 1 tablet (0.4 mg total) under the tongue every 5 (five) minutes as needed for chest pain. 20 tablet 1  . omeprazole (PRILOSEC) 20 MG capsule Take 1 capsule (20 mg total) by mouth 2 (two) times daily. 60 capsule 6  . triamcinolone cream (KENALOG) 0.5 % Apply 1 application topically daily as needed. 45 g 3  . [DISCONTINUED] gabapentin (NEURONTIN) 100 MG capsule Take 1 capsule (100 mg total) by mouth 2 (two) times daily. 180 capsule 1  . [DISCONTINUED] lisinopril (ZESTRIL) 5 MG tablet Take 1 tablet (5 mg total) by mouth daily. 90 tablet 1   No current facility-administered medications on file prior to visit.    BP (!) 140/58 (BP Location:  Right Arm, Patient Position: Sitting, Cuff Size: Normal)   Pulse 78   Temp 97.8 F (36.6 C) (Oral)   Resp 17  Ht 5\' 2"  (1.575 m)   Wt 160 lb (72.6 kg)   SpO2 97%   BMI 29.26 kg/m    Objective:   Physical Exam Constitutional:      Appearance: She is well-developed.  Cardiovascular:     Rate and Rhythm: Normal rate and regular rhythm.     Heart sounds: Normal heart sounds. No murmur heard.   Pulmonary:     Effort: Pulmonary effort is normal. No respiratory distress.     Breath sounds: Normal breath sounds. No wheezing.  Psychiatric:        Behavior: Behavior normal.        Thought Content: Thought content normal.        Judgment: Judgment normal.           Assessment & Plan:  Memory loss-patient scored 24 today on Mini-Mental status exam.  Suspect early dementia.  She is noted to repeat stories multiple times during today's visit.  Discussed with patient and daughter referring for further assessment such as neurocognitive testing and neurology referral.  We also discussed addition of medication such as Aricept.  They declined at this time.  They are open to some additional laboratories and a CT of the head to further evaluate.  See orders. MMSE - Mini Mental State Exam 10/26/2019  Orientation to time 4  Orientation to Place 5  Registration 3  Attention/ Calculation 3  Recall 0  Language- name 2 objects 2  Language- repeat 1  Language- follow 3 step command 3  Language- read & follow direction 1  Write a sentence 1  Copy design 1  Total score 24   Hypertension-blood pressure is stable.  Continue low-dose lisinopril.  Diabetes type 2-clinically stable.  Will obtain follow-up A1c.  Continue Metformin.  This visit occurred during the SARS-CoV-2 public health emergency.  Safety protocols were in place, including screening questions prior to the visit, additional usage of staff PPE, and extensive cleaning of exam room while observing appropriate contact time as indicated  for disinfecting solutions.

## 2019-10-27 LAB — RPR: RPR Ser Ql: NONREACTIVE

## 2019-10-27 LAB — HEMOGLOBIN A1C: Hgb A1c MFr Bld: 6 % (ref 4.6–6.5)

## 2019-10-27 LAB — TSH: TSH: 1.67 u[IU]/mL (ref 0.35–4.50)

## 2019-10-27 LAB — B12 AND FOLATE PANEL
Folate: >24.8 ng/mL (ref >5.9)
Vitamin B-12: 447 pg/mL (ref 211–911)

## 2019-10-29 ENCOUNTER — Other Ambulatory Visit: Payer: Self-pay | Admitting: Family

## 2019-11-09 DIAGNOSIS — E119 Type 2 diabetes mellitus without complications: Secondary | ICD-10-CM | POA: Diagnosis not present

## 2019-11-09 DIAGNOSIS — I509 Heart failure, unspecified: Secondary | ICD-10-CM | POA: Diagnosis not present

## 2019-11-09 DIAGNOSIS — M0540 Rheumatoid myopathy with rheumatoid arthritis of unspecified site: Secondary | ICD-10-CM | POA: Diagnosis not present

## 2019-11-09 DIAGNOSIS — J4522 Mild intermittent asthma with status asthmaticus: Secondary | ICD-10-CM | POA: Diagnosis not present

## 2019-11-09 DIAGNOSIS — I10 Essential (primary) hypertension: Secondary | ICD-10-CM | POA: Diagnosis not present

## 2019-11-15 DIAGNOSIS — I509 Heart failure, unspecified: Secondary | ICD-10-CM | POA: Diagnosis not present

## 2019-11-15 DIAGNOSIS — M0540 Rheumatoid myopathy with rheumatoid arthritis of unspecified site: Secondary | ICD-10-CM | POA: Diagnosis not present

## 2019-11-15 DIAGNOSIS — E119 Type 2 diabetes mellitus without complications: Secondary | ICD-10-CM | POA: Diagnosis not present

## 2019-11-15 DIAGNOSIS — I10 Essential (primary) hypertension: Secondary | ICD-10-CM | POA: Diagnosis not present

## 2019-11-15 DIAGNOSIS — J4522 Mild intermittent asthma with status asthmaticus: Secondary | ICD-10-CM | POA: Diagnosis not present

## 2019-11-16 ENCOUNTER — Ambulatory Visit (HOSPITAL_BASED_OUTPATIENT_CLINIC_OR_DEPARTMENT_OTHER)
Admission: RE | Admit: 2019-11-16 | Discharge: 2019-11-16 | Disposition: A | Discharge status: Home / Self Care | Payer: Medicare Other | Source: Ambulatory Visit | Attending: Family | Admitting: Family

## 2019-11-16 ENCOUNTER — Other Ambulatory Visit: Payer: Self-pay

## 2019-11-16 DIAGNOSIS — G309 Alzheimer's disease, unspecified: Secondary | ICD-10-CM | POA: Insufficient documentation

## 2019-11-16 DIAGNOSIS — R413 Other amnesia: Secondary | ICD-10-CM | POA: Diagnosis not present

## 2019-11-17 ENCOUNTER — Telehealth: Payer: Self-pay | Admitting: Family

## 2019-11-17 NOTE — Telephone Encounter (Signed)
LMOM informing Pt of results and recommendations.

## 2019-11-17 NOTE — Telephone Encounter (Signed)
Please advise pt that her CT does not show stroke.  It does show some chronic sinus changes. Please let me know if she develops sinus pain/pressure.

## 2019-11-23 ENCOUNTER — Other Ambulatory Visit: Payer: Self-pay | Admitting: Family

## 2019-11-24 DIAGNOSIS — I10 Essential (primary) hypertension: Secondary | ICD-10-CM | POA: Diagnosis not present

## 2019-11-24 DIAGNOSIS — E119 Type 2 diabetes mellitus without complications: Secondary | ICD-10-CM | POA: Diagnosis not present

## 2019-11-24 DIAGNOSIS — M0540 Rheumatoid myopathy with rheumatoid arthritis of unspecified site: Secondary | ICD-10-CM | POA: Diagnosis not present

## 2019-11-24 DIAGNOSIS — J4522 Mild intermittent asthma with status asthmaticus: Secondary | ICD-10-CM | POA: Diagnosis not present

## 2019-11-24 DIAGNOSIS — I509 Heart failure, unspecified: Secondary | ICD-10-CM | POA: Diagnosis not present

## 2019-12-07 DIAGNOSIS — I509 Heart failure, unspecified: Secondary | ICD-10-CM | POA: Diagnosis not present

## 2019-12-07 DIAGNOSIS — J4522 Mild intermittent asthma with status asthmaticus: Secondary | ICD-10-CM | POA: Diagnosis not present

## 2019-12-07 DIAGNOSIS — I10 Essential (primary) hypertension: Secondary | ICD-10-CM | POA: Diagnosis not present

## 2019-12-07 DIAGNOSIS — M0540 Rheumatoid myopathy with rheumatoid arthritis of unspecified site: Secondary | ICD-10-CM | POA: Diagnosis not present

## 2019-12-07 DIAGNOSIS — E119 Type 2 diabetes mellitus without complications: Secondary | ICD-10-CM | POA: Diagnosis not present

## 2019-12-13 ENCOUNTER — Telehealth: Payer: Self-pay | Admitting: Endocrinology

## 2019-12-13 NOTE — Telephone Encounter (Signed)
Jana Half calling to let Dr Dwyane Dee know that she has re-certified patient for home health care.

## 2019-12-13 NOTE — Telephone Encounter (Signed)
Is the daughter planning to bring the patient back for follow-up or is she being seen by PCP only?

## 2019-12-13 NOTE — Telephone Encounter (Signed)
FYI

## 2019-12-13 NOTE — Telephone Encounter (Signed)
Left message for patient to call back  

## 2019-12-20 DIAGNOSIS — I509 Heart failure, unspecified: Secondary | ICD-10-CM | POA: Diagnosis not present

## 2019-12-20 DIAGNOSIS — M0540 Rheumatoid myopathy with rheumatoid arthritis of unspecified site: Secondary | ICD-10-CM | POA: Diagnosis not present

## 2019-12-20 DIAGNOSIS — I10 Essential (primary) hypertension: Secondary | ICD-10-CM | POA: Diagnosis not present

## 2019-12-20 DIAGNOSIS — E119 Type 2 diabetes mellitus without complications: Secondary | ICD-10-CM | POA: Diagnosis not present

## 2019-12-20 DIAGNOSIS — J4522 Mild intermittent asthma with status asthmaticus: Secondary | ICD-10-CM | POA: Diagnosis not present

## 2019-12-22 DIAGNOSIS — I509 Heart failure, unspecified: Secondary | ICD-10-CM | POA: Diagnosis not present

## 2019-12-22 DIAGNOSIS — M0540 Rheumatoid myopathy with rheumatoid arthritis of unspecified site: Secondary | ICD-10-CM | POA: Diagnosis not present

## 2019-12-22 DIAGNOSIS — J4522 Mild intermittent asthma with status asthmaticus: Secondary | ICD-10-CM | POA: Diagnosis not present

## 2019-12-22 DIAGNOSIS — E119 Type 2 diabetes mellitus without complications: Secondary | ICD-10-CM | POA: Diagnosis not present

## 2019-12-22 DIAGNOSIS — I10 Essential (primary) hypertension: Secondary | ICD-10-CM | POA: Diagnosis not present

## 2019-12-24 ENCOUNTER — Other Ambulatory Visit: Payer: Self-pay

## 2019-12-24 ENCOUNTER — Ambulatory Visit (INDEPENDENT_AMBULATORY_CARE_PROVIDER_SITE_OTHER): Payer: Medicare Other | Admitting: Podiatry

## 2019-12-24 ENCOUNTER — Encounter: Payer: Self-pay | Admitting: Podiatry

## 2019-12-24 DIAGNOSIS — E119 Type 2 diabetes mellitus without complications: Secondary | ICD-10-CM

## 2019-12-24 DIAGNOSIS — B351 Tinea unguium: Secondary | ICD-10-CM

## 2019-12-24 DIAGNOSIS — M79675 Pain in left toe(s): Secondary | ICD-10-CM

## 2019-12-24 DIAGNOSIS — M79674 Pain in right toe(s): Secondary | ICD-10-CM | POA: Diagnosis not present

## 2019-12-24 NOTE — Progress Notes (Signed)
This patient returns to my office for at risk foot care.  This patient requires this care by a professional since this patient will be at risk due to having  Type 2 diabetes.  This patient is unable to cut nails herself since the patient cannot reach her nails.These nails are painful walking and wearing shoes.   Patient presents to the office with her daughter. This patient presents for at risk foot care today.  General Appearance  Alert, conversant and in no acute stress.  Vascular  Dorsalis pedis  are palpable  bilaterally.  Posterior tibial pulses are not palpable  B/L.  Capillary return is within normal limits  bilaterally. Temperature is within normal limits  bilaterally.  Neurologic  Senn-Weinstein monofilament wire test diminished  bilaterally. Muscle power within normal limits bilaterally.  Nails Thick disfigured discolored nails with subungual debris  from hallux to fifth toes bilaterally. No evidence of bacterial infection or drainage bilaterally.  Orthopedic  No limitations of motion  feet .  No crepitus or effusions noted.  No bony pathology or digital deformities noted.  Skin  normotropic skin with no porokeratosis noted bilaterally.  No signs of infections or ulcers noted.     Onychomycosis  Pain in right toes  Pain in left toes  Consent was obtained for treatment procedures.   Mechanical debridement of nails 1-5  bilaterally performed with a nail nipper.  Filed with dremel without incident.    Return office visit   3 months                   Told patient to return for periodic foot care and evaluation due to potential at risk complications.   Gardiner Barefoot DPM

## 2020-01-05 DIAGNOSIS — I509 Heart failure, unspecified: Secondary | ICD-10-CM | POA: Diagnosis not present

## 2020-01-05 DIAGNOSIS — M0540 Rheumatoid myopathy with rheumatoid arthritis of unspecified site: Secondary | ICD-10-CM | POA: Diagnosis not present

## 2020-01-05 DIAGNOSIS — E119 Type 2 diabetes mellitus without complications: Secondary | ICD-10-CM | POA: Diagnosis not present

## 2020-01-05 DIAGNOSIS — I10 Essential (primary) hypertension: Secondary | ICD-10-CM | POA: Diagnosis not present

## 2020-01-05 DIAGNOSIS — J4522 Mild intermittent asthma with status asthmaticus: Secondary | ICD-10-CM | POA: Diagnosis not present

## 2020-01-18 DIAGNOSIS — E119 Type 2 diabetes mellitus without complications: Secondary | ICD-10-CM | POA: Diagnosis not present

## 2020-01-18 DIAGNOSIS — I509 Heart failure, unspecified: Secondary | ICD-10-CM | POA: Diagnosis not present

## 2020-01-18 DIAGNOSIS — I10 Essential (primary) hypertension: Secondary | ICD-10-CM | POA: Diagnosis not present

## 2020-01-18 DIAGNOSIS — J4522 Mild intermittent asthma with status asthmaticus: Secondary | ICD-10-CM | POA: Diagnosis not present

## 2020-01-18 DIAGNOSIS — M0540 Rheumatoid myopathy with rheumatoid arthritis of unspecified site: Secondary | ICD-10-CM | POA: Diagnosis not present

## 2020-01-19 DIAGNOSIS — I10 Essential (primary) hypertension: Secondary | ICD-10-CM | POA: Diagnosis not present

## 2020-01-19 DIAGNOSIS — M0540 Rheumatoid myopathy with rheumatoid arthritis of unspecified site: Secondary | ICD-10-CM | POA: Diagnosis not present

## 2020-01-19 DIAGNOSIS — J4522 Mild intermittent asthma with status asthmaticus: Secondary | ICD-10-CM | POA: Diagnosis not present

## 2020-01-19 DIAGNOSIS — I509 Heart failure, unspecified: Secondary | ICD-10-CM | POA: Diagnosis not present

## 2020-01-19 DIAGNOSIS — E119 Type 2 diabetes mellitus without complications: Secondary | ICD-10-CM | POA: Diagnosis not present

## 2020-02-01 DIAGNOSIS — M0540 Rheumatoid myopathy with rheumatoid arthritis of unspecified site: Secondary | ICD-10-CM | POA: Diagnosis not present

## 2020-02-01 DIAGNOSIS — I509 Heart failure, unspecified: Secondary | ICD-10-CM | POA: Diagnosis not present

## 2020-02-01 DIAGNOSIS — J4522 Mild intermittent asthma with status asthmaticus: Secondary | ICD-10-CM | POA: Diagnosis not present

## 2020-02-01 DIAGNOSIS — I10 Essential (primary) hypertension: Secondary | ICD-10-CM | POA: Diagnosis not present

## 2020-02-01 DIAGNOSIS — E119 Type 2 diabetes mellitus without complications: Secondary | ICD-10-CM | POA: Diagnosis not present

## 2020-02-07 ENCOUNTER — Telehealth: Payer: Self-pay | Admitting: Endocrinology

## 2020-02-07 NOTE — Telephone Encounter (Signed)
Please see below and advise.

## 2020-02-07 NOTE — Telephone Encounter (Signed)
Angelica Benson with [facility] requests to be called at ph# [phone] re: Patient has been in [facility]'s care and requests Verbal Consent for Re-certification for home health/medication management. This is a new policy for [facility].

## 2020-02-07 NOTE — Telephone Encounter (Signed)
Will have to be done by PCP, she is not under my care anymore for diabetes and has no future appointment

## 2020-02-07 NOTE — Telephone Encounter (Signed)
Noted, she is aware. 

## 2020-02-08 ENCOUNTER — Telehealth: Payer: Self-pay | Admitting: Family

## 2020-02-08 NOTE — Telephone Encounter (Signed)
Caller name: Amie ([facility])  Call back number:[phone]  Need verbal authorization to continue to  pre-fill medication.

## 2020-02-09 NOTE — Telephone Encounter (Signed)
Verbal orders given to Amie to continue to visit patient and pre-fill medications for her. This orders were originally started per [facility].

## 2020-02-14 DIAGNOSIS — J4522 Mild intermittent asthma with status asthmaticus: Secondary | ICD-10-CM | POA: Diagnosis not present

## 2020-02-14 DIAGNOSIS — I10 Essential (primary) hypertension: Secondary | ICD-10-CM | POA: Diagnosis not present

## 2020-02-14 DIAGNOSIS — M0540 Rheumatoid myopathy with rheumatoid arthritis of unspecified site: Secondary | ICD-10-CM | POA: Diagnosis not present

## 2020-02-14 DIAGNOSIS — I509 Heart failure, unspecified: Secondary | ICD-10-CM | POA: Diagnosis not present

## 2020-02-14 DIAGNOSIS — E119 Type 2 diabetes mellitus without complications: Secondary | ICD-10-CM | POA: Diagnosis not present

## 2020-02-15 DIAGNOSIS — I10 Essential (primary) hypertension: Secondary | ICD-10-CM | POA: Diagnosis not present

## 2020-02-15 DIAGNOSIS — M0540 Rheumatoid myopathy with rheumatoid arthritis of unspecified site: Secondary | ICD-10-CM | POA: Diagnosis not present

## 2020-02-15 DIAGNOSIS — I509 Heart failure, unspecified: Secondary | ICD-10-CM | POA: Diagnosis not present

## 2020-02-15 DIAGNOSIS — E119 Type 2 diabetes mellitus without complications: Secondary | ICD-10-CM | POA: Diagnosis not present

## 2020-02-15 DIAGNOSIS — J4522 Mild intermittent asthma with status asthmaticus: Secondary | ICD-10-CM | POA: Diagnosis not present

## 2020-02-23 ENCOUNTER — Encounter: Payer: Self-pay | Admitting: Family

## 2020-02-24 ENCOUNTER — Other Ambulatory Visit: Payer: Self-pay

## 2020-02-24 DIAGNOSIS — E119 Type 2 diabetes mellitus without complications: Secondary | ICD-10-CM

## 2020-02-24 MED ORDER — ACCU-CHEK GUIDE VI STRP: ORAL_STRIP | 12 refills | Status: DC

## 2020-02-24 NOTE — Progress Notes (Signed)
accu

## 2020-02-29 DIAGNOSIS — I10 Essential (primary) hypertension: Secondary | ICD-10-CM | POA: Diagnosis not present

## 2020-02-29 DIAGNOSIS — J4522 Mild intermittent asthma with status asthmaticus: Secondary | ICD-10-CM | POA: Diagnosis not present

## 2020-02-29 DIAGNOSIS — M0540 Rheumatoid myopathy with rheumatoid arthritis of unspecified site: Secondary | ICD-10-CM | POA: Diagnosis not present

## 2020-02-29 DIAGNOSIS — E119 Type 2 diabetes mellitus without complications: Secondary | ICD-10-CM | POA: Diagnosis not present

## 2020-02-29 DIAGNOSIS — I509 Heart failure, unspecified: Secondary | ICD-10-CM | POA: Diagnosis not present

## 2020-03-14 DIAGNOSIS — I509 Heart failure, unspecified: Secondary | ICD-10-CM | POA: Diagnosis not present

## 2020-03-14 DIAGNOSIS — E119 Type 2 diabetes mellitus without complications: Secondary | ICD-10-CM | POA: Diagnosis not present

## 2020-03-14 DIAGNOSIS — M0540 Rheumatoid myopathy with rheumatoid arthritis of unspecified site: Secondary | ICD-10-CM | POA: Diagnosis not present

## 2020-03-14 DIAGNOSIS — J4522 Mild intermittent asthma with status asthmaticus: Secondary | ICD-10-CM | POA: Diagnosis not present

## 2020-03-14 DIAGNOSIS — I10 Essential (primary) hypertension: Secondary | ICD-10-CM | POA: Diagnosis not present

## 2020-03-20 DIAGNOSIS — M0540 Rheumatoid myopathy with rheumatoid arthritis of unspecified site: Secondary | ICD-10-CM | POA: Diagnosis not present

## 2020-03-20 DIAGNOSIS — I10 Essential (primary) hypertension: Secondary | ICD-10-CM | POA: Diagnosis not present

## 2020-03-20 DIAGNOSIS — I509 Heart failure, unspecified: Secondary | ICD-10-CM | POA: Diagnosis not present

## 2020-03-20 DIAGNOSIS — J4522 Mild intermittent asthma with status asthmaticus: Secondary | ICD-10-CM | POA: Diagnosis not present

## 2020-03-20 DIAGNOSIS — E119 Type 2 diabetes mellitus without complications: Secondary | ICD-10-CM | POA: Diagnosis not present

## 2020-03-21 ENCOUNTER — Ambulatory Visit (INDEPENDENT_AMBULATORY_CARE_PROVIDER_SITE_OTHER): Payer: Medicare Other | Admitting: Podiatry

## 2020-03-21 ENCOUNTER — Other Ambulatory Visit: Payer: Self-pay

## 2020-03-21 DIAGNOSIS — B351 Tinea unguium: Secondary | ICD-10-CM | POA: Diagnosis not present

## 2020-03-21 DIAGNOSIS — M79675 Pain in left toe(s): Secondary | ICD-10-CM | POA: Diagnosis not present

## 2020-03-21 DIAGNOSIS — E119 Type 2 diabetes mellitus without complications: Secondary | ICD-10-CM | POA: Diagnosis not present

## 2020-03-21 DIAGNOSIS — M79674 Pain in right toe(s): Secondary | ICD-10-CM

## 2020-03-21 NOTE — Progress Notes (Signed)
This patient returns to my office for at risk foot care.  This patient requires this care by a professional since this patient will be at risk due to having  Type 2 diabetes.  This patient is unable to cut nails herself since the patient cannot reach her nails.These nails are painful walking and wearing shoes.   Patient presents to the office with her daughter. This patient presents for at risk foot care today.  General Appearance  Alert, conversant and in no acute stress.  Vascular  Dorsalis pedis  are palpable  bilaterally.  Posterior tibial pulses are not palpable  B/L. Absent digital hair. Capillary return is within normal limits  bilaterally. Temperature is within normal limits  bilaterally.  Neurologic  Senn-Weinstein monofilament wire test diminished  bilaterally. Muscle power within normal limits bilaterally.  Nails Thick disfigured discolored nails with subungual debris  from hallux to fifth toes bilaterally. No evidence of bacterial infection or drainage bilaterally.  Orthopedic  No limitations of motion  feet .  No crepitus or effusions noted.  No bony pathology or digital deformities noted.  Skin  normotropic skin with no porokeratosis noted bilaterally.  No signs of infections or ulcers noted.     Onychomycosis  Pain in right toes  Pain in left toes  Consent was obtained for treatment procedures.   Mechanical debridement of nails 1-5  bilaterally performed with a nail nipper.  Filed with dremel without incident.    Return office visit   3 months                   Told patient to return for periodic foot care and evaluation due to potential at risk complications.   Aiman Sonn DPM   

## 2020-03-28 DIAGNOSIS — I509 Heart failure, unspecified: Secondary | ICD-10-CM | POA: Diagnosis not present

## 2020-03-28 DIAGNOSIS — E119 Type 2 diabetes mellitus without complications: Secondary | ICD-10-CM | POA: Diagnosis not present

## 2020-03-28 DIAGNOSIS — I10 Essential (primary) hypertension: Secondary | ICD-10-CM | POA: Diagnosis not present

## 2020-03-28 DIAGNOSIS — M0540 Rheumatoid myopathy with rheumatoid arthritis of unspecified site: Secondary | ICD-10-CM | POA: Diagnosis not present

## 2020-03-28 DIAGNOSIS — J4522 Mild intermittent asthma with status asthmaticus: Secondary | ICD-10-CM | POA: Diagnosis not present

## 2020-04-11 ENCOUNTER — Telehealth: Payer: Self-pay | Admitting: Family

## 2020-04-11 NOTE — Telephone Encounter (Signed)
[name] with [facility] Call Back # [phone]  Angelica Benson is requesting re-certification  for in home healthcare.  Requesting a verbal ok . Voicemail can be left on her cell listed above.

## 2020-04-11 NOTE — Telephone Encounter (Signed)
Verbal orders given  

## 2020-04-11 NOTE — Telephone Encounter (Signed)
Please advise 

## 2020-04-11 NOTE — Telephone Encounter (Signed)
Ok. Thanks!

## 2020-04-13 ENCOUNTER — Other Ambulatory Visit: Payer: Self-pay | Admitting: Family

## 2020-04-20 ENCOUNTER — Telehealth: Payer: Self-pay | Admitting: Family

## 2020-04-20 NOTE — Telephone Encounter (Signed)
Just an FYI

## 2020-04-20 NOTE — Telephone Encounter (Signed)
Amy with [facility]  Call Back # [phone], ext [phone]  Per Amy, Patient's family has declined their services to refill patient medications for two weeks  at a time(nursing care). Per Amy, Patient 's family is upset and hateful to nurse that went out today.   Please advise

## 2020-04-28 ENCOUNTER — Other Ambulatory Visit: Payer: Self-pay

## 2020-04-28 ENCOUNTER — Telehealth: Payer: Self-pay | Admitting: Family

## 2020-04-28 ENCOUNTER — Other Ambulatory Visit (HOSPITAL_BASED_OUTPATIENT_CLINIC_OR_DEPARTMENT_OTHER): Payer: Self-pay | Admitting: Internal Medicine

## 2020-04-28 ENCOUNTER — Ambulatory Visit: Payer: Medicare Other | Attending: Internal Medicine

## 2020-04-28 ENCOUNTER — Ambulatory Visit (INDEPENDENT_AMBULATORY_CARE_PROVIDER_SITE_OTHER): Payer: Medicare Other | Admitting: Family

## 2020-04-28 VITALS — BP 141/54 | HR 91 | Temp 97.9°F | Resp 16 | Wt 153.2 lb

## 2020-04-28 DIAGNOSIS — E119 Type 2 diabetes mellitus without complications: Secondary | ICD-10-CM | POA: Diagnosis not present

## 2020-04-28 DIAGNOSIS — Z0000 Encounter for general adult medical examination without abnormal findings: Secondary | ICD-10-CM

## 2020-04-28 DIAGNOSIS — E348 Other specified endocrine disorders: Secondary | ICD-10-CM | POA: Diagnosis not present

## 2020-04-28 DIAGNOSIS — E785 Hyperlipidemia, unspecified: Secondary | ICD-10-CM | POA: Diagnosis not present

## 2020-04-28 DIAGNOSIS — R5383 Other fatigue: Secondary | ICD-10-CM | POA: Diagnosis not present

## 2020-04-28 DIAGNOSIS — Z23 Encounter for immunization: Secondary | ICD-10-CM

## 2020-04-28 LAB — COMPREHENSIVE METABOLIC PANEL
ALT: 11 U/L (ref 0–35)
AST: 19 U/L (ref 0–37)
Albumin: 4 g/dL (ref 3.5–5.2)
Alkaline Phosphatase: 54 U/L (ref 39–117)
BUN: 17 mg/dL (ref 6–23)
CO2: 27 mEq/L (ref 19–32)
Calcium: 9.7 mg/dL (ref 8.4–10.5)
Chloride: 103 mEq/L (ref 96–112)
Creatinine, Ser: 0.95 mg/dL (ref 0.40–1.20)
GFR: 53.07 mL/min — ABNORMAL LOW (ref >60.00)
Glucose, Bld: 90 mg/dL (ref 70–99)
Potassium: 4.4 mEq/L (ref 3.5–5.1)
Sodium: 139 mEq/L (ref 135–145)
Total Bilirubin: 0.4 mg/dL (ref 0.2–1.2)
Total Protein: 7 g/dL (ref 6.0–8.3)

## 2020-04-28 LAB — CBC WITH DIFFERENTIAL/PLATELET
Basophils Absolute: 0 10*3/uL (ref 0.0–0.1)
Basophils Relative: 0.7 % (ref 0.0–3.0)
Eosinophils Absolute: 0.1 10*3/uL (ref 0.0–0.7)
Eosinophils Relative: 3.1 % (ref 0.0–5.0)
HCT: 34.7 % — ABNORMAL LOW (ref 36.0–46.0)
Hemoglobin: 11.6 g/dL — ABNORMAL LOW (ref 12.0–15.0)
Lymphocytes Relative: 36 % (ref 12.0–46.0)
Lymphs Abs: 1.6 10*3/uL (ref 0.7–4.0)
MCHC: 33.4 g/dL (ref 30.0–36.0)
MCV: 91.5 fl (ref 78.0–100.0)
Monocytes Absolute: 0.4 10*3/uL (ref 0.1–1.0)
Monocytes Relative: 7.7 % (ref 3.0–12.0)
Neutro Abs: 2.4 10*3/uL (ref 1.4–7.7)
Neutrophils Relative %: 52.5 % (ref 43.0–77.0)
Platelets: 224 10*3/uL (ref 150.0–400.0)
RBC: 3.8 Mil/uL — ABNORMAL LOW (ref 3.87–5.11)
RDW: 14.6 % (ref 11.5–15.5)
WBC: 4.5 10*3/uL (ref 4.0–10.5)

## 2020-04-28 LAB — LIPID PANEL
Cholesterol: 130 mg/dL (ref 0–200)
HDL: 68.9 mg/dL (ref >39.00)
LDL Cholesterol: 39 mg/dL (ref 0–99)
NonHDL: 60.68
Total CHOL/HDL Ratio: 2
Triglycerides: 108 mg/dL (ref 0.0–149.0)
VLDL: 21.6 mg/dL (ref 0.0–40.0)

## 2020-04-28 LAB — HEMOGLOBIN A1C: Hgb A1c MFr Bld: 6.2 % (ref 4.6–6.5)

## 2020-04-28 LAB — TSH: TSH: 1.48 u[IU]/mL (ref 0.35–4.50)

## 2020-04-28 MED ORDER — NITROGLYCERIN 0.4 MG SL SUBL
0.4000 mg | SUBLINGUAL_TABLET | SUBLINGUAL | 1 refills | Status: DC | PRN
Start: 2020-04-28 — End: 2020-05-15

## 2020-04-28 MED ORDER — GABAPENTIN 100 MG PO CAPS: 100.0000 mg | ORAL_CAPSULE | Freq: Two times a day (BID) | ORAL | 1 refills | Status: DC | PRN

## 2020-04-28 MED FILL — FLUAD QUADRIVALENT 0.5 ML P: 0.5 | 1 days supply | Qty: 1 | Fill #0

## 2020-04-28 NOTE — Patient Instructions (Signed)
Please complete lab work prior to leaving. Get your Covid Booster downstairs.

## 2020-04-28 NOTE — Telephone Encounter (Signed)
Please call to request DM eye exam.

## 2020-04-28 NOTE — Progress Notes (Signed)
   Covid-19 Vaccination Clinic  Name:  Angelica Benson    MRN: [id number] DOB: [birth year]  04/28/2020  [name] was observed post Covid-19 immunization for 15 minutes without incident. She was provided with Vaccine Information Sheet and instruction to access the V-Safe system.   [name] was instructed to call 911 with any severe reactions post vaccine: Marland Kitchen Difficulty breathing  . Swelling of face and throat  . A fast heartbeat  . A bad rash all over body  . Dizziness and weakness   Immunizations Administered    Name Date Dose VIS Date Route   Moderna Covid-19 Booster Vaccine 04/28/2020  2:08 PM 0.25 mL 02/02/2020 Intramuscular   Manufacturer: Levan Hurst   Lot: [id number]   [place]: [id number]

## 2020-04-28 NOTE — Progress Notes (Signed)
Subjective:    Patient ID: Angelica Benson, female    DOB: Feb 01, 1929, 87 y.o.   MRN: [id number]  HPI  Patient is an 87 yr old female who presents today for follow up. She is accompanied by her daughter Vita Barley.  Reports "I get so tired."  Goes to bed at 11 PM, trouble falling asleep. Aid comes at 8 AM, she usually gets up at 6. 8:30-10:30 AM 7 days a week in the day.  This is through [facility]. She is interested in having an aid in the evenings to get her ready for bed.  She lives alone.  She reports that she fatigues very easily when she tries to use the rolling walker.   Hyperlipidemia- Maintained on atorvastatin 10mg .  Lab Results  Component Value Date   CHOL 100 10/15/2018   HDL 56.60 10/15/2018   LDLCALC 30 10/15/2018   TRIG 71.0 10/15/2018   CHOLHDL 2 10/15/2018    DM2- Reports that her readings have been really good.  Lab Results  Component Value Date   HGBA1C 6.0 10/26/2019   HGBA1C 6.0 03/29/2019   HGBA1C 6.4 10/15/2018   Lab Results  Component Value Date   MICROALBUR <0.7 04/04/2017   LDLCALC 30 10/15/2018   CREATININE 0.95 05/04/2019   HTN- maintained on lisinopril 5mg .  BP Readings from Last 3 Encounters:  04/28/20 (!) 141/54  10/26/19 (!) 140/58  03/29/19 (!) 145/71     Review of Systems See HPI  Past Medical History:  Diagnosis Date  . Arthritis   . Asthma   . CHF (congestive heart failure) ([place])   . Chronic headaches   . Constipation   . Diabetes mellitus without complication ([place])   . Hemorrhoids without complication   . Hypertension      Social History   Socioeconomic History  . Marital status: Single    Spouse name: Not on file  . Number of children: Not on file  . Years of education: Not on file  . Highest education level: Not on file  Occupational History  . Not on file  Tobacco Use  . Smoking status: Never Smoker  . Smokeless tobacco: Never Used  Vaping Use  . Vaping Use: Never used  Substance and Sexual Activity   . Alcohol use: No  . Drug use: No  . Sexual activity: Not Currently  Other Topics Concern  . Not on file  Social History Narrative   Lives at home by herself, aide in the am.     Has 4 daughters and 3 living sons (one son died).    Uses walker.     Divorced at age 37   Social Determinants of Health   Financial Resource Strain: Not on file  Food Insecurity: Not on file  Transportation Needs: Not on file  Physical Activity: Not on file  Stress: Not on file  Social Connections: Not on file  Intimate Partner Violence: Not on file    Past Surgical History:  Procedure Laterality Date  . ABDOMINAL HYSTERECTOMY    . BACK SURGERY    . CHOLECYSTECTOMY    . COLONOSCOPY  09/04/2001   Two rectal polyps. Small internal hemorrhoids  . ESOPHAGOGASTRODUODENOSCOPY  09/04/2001   Two apparent antral polyps with one possible gastroesophageal juction polyp versus enlarged fold or focal gastritis  . KNEE SURGERY      Family History  Problem Relation Age of Onset  . Diabetes Son   . Hypertension Son   . Cancer  Sister        breast  . Diabetes Daughter        plus 2 grandkids  . Stroke Son        56mo age dx, has enlarged heart  . Heart disease Son        passed away    Allergies  Allergen Reactions  . Buprenorphine Hcl Rash  . Morphine And Related Rash    Current Outpatient Medications on File Prior to Visit  Medication Sig Dispense Refill  . Accu-Chek FastClix Lancets MISC USE AS DIRECTED TO CHECK  BLOOD SUGAR 102 each 3  . acetaminophen (TYLENOL) 500 MG tablet Take 2 tablets (1,000 mg total) by mouth 2 (two) times daily as needed for moderate pain. 30 tablet 0  . albuterol (VENTOLIN HFA) 108 (90 Base) MCG/ACT inhaler USE 2 INHALATIONS BY MOUTH  EVERY 6 HOURS AS NEEDED FOR WHEEZING OR SHORTNESS OF  BREATH 34 g 2  . aspirin EC 81 MG tablet Take 81 mg by mouth daily.    Marland Kitchen atorvastatin (LIPITOR) 10 MG tablet TAKE 1 TABLET BY MOUTH  DAILY 90 tablet 3  . Cholecalciferol (VITAMIN  D3) 75 MCG (3000 UT) TABS Take 1 tablet by mouth daily. 90 tablet 3  . diclofenac sodium (VOLTAREN) 1 % GEL Apply 4 g topically 4 (four) times daily. (Patient taking differently: Apply 4 g topically as needed.) 100 g 0  . fluticasone (FLONASE) 50 MCG/ACT nasal spray USE 2 SPRAYS IN BOTH  NOSTRILS DAILY 48 g 3  . glucose blood (ACCU-CHEK GUIDE) test strip Use as instructed 100 each 12  . lisinopril (ZESTRIL) 5 MG tablet TAKE 1 TABLET BY MOUTH  DAILY 90 tablet 3  . metFORMIN (GLUCOPHAGE) 500 MG tablet TAKE 1 TABLET BY MOUTH  TWICE DAILY WITH A MEAL 180 tablet 3  . mometasone-formoterol (DULERA) 200-5 MCG/ACT AERO USE 1 INHALATION BY MOUTH  EVERY 12 HOURS 26 g 1  . Multiple Vitamins-Minerals (CENTRUM SILVER 50+WOMEN) TABS Take 1 tablet by mouth daily.    . nitroGLYCERIN (NITROSTAT) 0.4 MG SL tablet Place 1 tablet (0.4 mg total) under the tongue every 5 (five) minutes as needed for chest pain. 20 tablet 1  . omeprazole (PRILOSEC) 20 MG capsule TAKE 1 CAPSULE BY MOUTH  DAILY 90 capsule 3   No current facility-administered medications on file prior to visit.    BP (!) 141/54 (BP Location: Left Arm, Patient Position: Sitting, Cuff Size: Normal)   Pulse 91   Temp 97.9 F (36.6 C) (Oral)   Resp 16   Wt 153 lb 3.2 oz (69.5 kg)   SpO2 100%   BMI 28.02 kg/m       Objective:   Physical Exam Constitutional:      Appearance: She is well-developed and well-nourished.  Cardiovascular:     Rate and Rhythm: Normal rate and regular rhythm.     Heart sounds: Normal heart sounds. No murmur heard.   Pulmonary:     Effort: Pulmonary effort is normal. No respiratory distress.     Breath sounds: Normal breath sounds. No wheezing.  Musculoskeletal:     Comments: 2+ bilateral LE edema  Psychiatric:        Mood and Affect: Mood and affect normal.        Behavior: Behavior normal.        Thought Content: Thought content normal.        Judgment: Judgment normal.  Assessment & Plan:   HTN- bp acceptable for age- continue lisinopril 5mg . Obtain CMET.   DM2- clinically stable on metformin 500 mg bid. Obtain follow up A1C.   Hyperlipidemia- tolerating statin. Last LDL at goal. Repeat lipid panel, continue atorvastatin 10mg .   Flu shot today.

## 2020-05-02 MED FILL — MODERNA COVID-19 VACCINE 10: 100 | 1 days supply | Qty: 0 | Fill #0

## 2020-05-02 NOTE — Telephone Encounter (Signed)
Do you know who her eye MD is?

## 2020-05-02 NOTE — Telephone Encounter (Signed)
Her daughter was going to check and get back to [place]. Rod Holler, do you mind reaching out to her daughter and asking her please?

## 2020-05-03 NOTE — Telephone Encounter (Signed)
Her eye doctor is [facility], medical records release for was faxed to them on 04-28-20

## 2020-05-13 ENCOUNTER — Other Ambulatory Visit: Payer: Self-pay | Admitting: Family

## 2020-05-23 ENCOUNTER — Telehealth: Payer: Self-pay

## 2020-05-23 ENCOUNTER — Telehealth: Payer: Self-pay | Admitting: Family

## 2020-05-23 DIAGNOSIS — I509 Heart failure, unspecified: Secondary | ICD-10-CM | POA: Diagnosis not present

## 2020-05-23 DIAGNOSIS — I10 Essential (primary) hypertension: Secondary | ICD-10-CM | POA: Diagnosis not present

## 2020-05-23 DIAGNOSIS — M0540 Rheumatoid myopathy with rheumatoid arthritis of unspecified site: Secondary | ICD-10-CM | POA: Diagnosis not present

## 2020-05-23 DIAGNOSIS — J4522 Mild intermittent asthma with status asthmaticus: Secondary | ICD-10-CM | POA: Diagnosis not present

## 2020-05-23 DIAGNOSIS — E119 Type 2 diabetes mellitus without complications: Secondary | ICD-10-CM | POA: Diagnosis not present

## 2020-05-23 NOTE — Telephone Encounter (Signed)
Caller Name Briscoe Burns Phone Number [phone] Patient Name Angelica Benson Patient DOB [birth year] Call Type Message Only Information Provided Reason for Call Request to Schedule Office Appointment Initial Comment Caller states she is calling from [employer]. She has a mutual patient on the line too. She is needing to get scheduled. Additional Comment She needs to schedule a face to face mobility exam. Caller is wanting to be called with a confirmation date and time. Her number is [phone]. Provided fax number.

## 2020-05-23 NOTE — Telephone Encounter (Signed)
Form put on provider's folder to complete missing information

## 2020-05-23 NOTE — Telephone Encounter (Signed)
Document faxed to office to be filled out by provider -([employer] [place]- 6 pages) Missing out impact ADLs and Description. Document put at front office tray under providers name.

## 2020-05-24 NOTE — Telephone Encounter (Signed)
Pt has appt w/ Melissa on 06/06/2020.

## 2020-06-01 ENCOUNTER — Other Ambulatory Visit: Payer: Self-pay

## 2020-06-01 ENCOUNTER — Ambulatory Visit (HOSPITAL_BASED_OUTPATIENT_CLINIC_OR_DEPARTMENT_OTHER)
Admission: RE | Admit: 2020-06-01 | Discharge: 2020-06-01 | Disposition: A | Discharge status: Home / Self Care | Payer: Medicare Other | Source: Ambulatory Visit | Attending: Family | Admitting: Family

## 2020-06-01 DIAGNOSIS — E119 Type 2 diabetes mellitus without complications: Secondary | ICD-10-CM | POA: Insufficient documentation

## 2020-06-01 DIAGNOSIS — M8589 Other specified disorders of bone density and structure, multiple sites: Secondary | ICD-10-CM | POA: Insufficient documentation

## 2020-06-01 DIAGNOSIS — E348 Other specified endocrine disorders: Secondary | ICD-10-CM | POA: Diagnosis not present

## 2020-06-01 DIAGNOSIS — Z0000 Encounter for general adult medical examination without abnormal findings: Secondary | ICD-10-CM

## 2020-06-01 DIAGNOSIS — M85852 Other specified disorders of bone density and structure, left thigh: Secondary | ICD-10-CM | POA: Diagnosis not present

## 2020-06-01 DIAGNOSIS — Z13820 Encounter for screening for osteoporosis: Secondary | ICD-10-CM | POA: Diagnosis not present

## 2020-06-01 DIAGNOSIS — Z780 Asymptomatic menopausal state: Secondary | ICD-10-CM | POA: Insufficient documentation

## 2020-06-06 ENCOUNTER — Other Ambulatory Visit: Payer: Self-pay

## 2020-06-06 ENCOUNTER — Telehealth: Payer: Self-pay | Admitting: Family

## 2020-06-06 ENCOUNTER — Ambulatory Visit (INDEPENDENT_AMBULATORY_CARE_PROVIDER_SITE_OTHER): Payer: Medicare Other | Admitting: Family

## 2020-06-06 VITALS — BP 142/46 | HR 74 | Temp 98.1°F | Resp 16 | Ht 62.0 in | Wt 156.0 lb

## 2020-06-06 DIAGNOSIS — I209 Angina pectoris, unspecified: Secondary | ICD-10-CM | POA: Diagnosis not present

## 2020-06-06 DIAGNOSIS — Z7689 Persons encountering health services in other specified circumstances: Secondary | ICD-10-CM

## 2020-06-06 MED ORDER — SHINGRIX 50 MCG/0.5ML IM SUSR: INTRAMUSCULAR | 1 refills | Status: DC

## 2020-06-06 NOTE — Telephone Encounter (Signed)
Please print a copy of my office note from today and send along with the additional paperwork to pt's daughter:   Taijah Macrae   [address]  [place] [phone]   I left the papers on your desk. Tks.

## 2020-06-06 NOTE — Progress Notes (Signed)
Subjective:    Patient ID: Angelica Benson, female    DOB: 05-Mar-1929, 87 y.o.   MRN: [id number]  HPI   Patient is an 87 yr old female who presents today for mobility examination.  She is wishing to have additional home health aid hours and obtain a power mobility device due to her difficulty ambulating and safely completing ADL's. She lives alone.  She has become progressively more dependent on her Rollator and sometimes has trouble standing upright due to low back pain.  She does have some chronic lower extremity edema but no history of pressure sores.  She is able to shift her weight.  She is having more difficulty getting to and from the bathroom to toilet or bathe.  She needs assistance for bathing.  She does not feel that her current amount of aide assistance at home is sufficient for her current needs.  No fall in the last year.  Uses rollator at home. Relies on both hands to support her.   Needs assistance for showering.  Has a tub with shower chair.  Unable to independently cook.    Sometimes she feels light headed.    She lives in an apartment- no stairs.    Review of Systems See HPI  Past Medical History:  Diagnosis Date  . Arthritis   . Asthma   . CHF (congestive heart failure) ([place])   . Chronic headaches   . Constipation   . Diabetes mellitus without complication ([place])   . Hemorrhoids without complication   . Hypertension      Social History   Socioeconomic History  . Marital status: Single    Spouse name: Not on file  . Number of children: Not on file  . Years of education: Not on file  . Highest education level: Not on file  Occupational History  . Not on file  Tobacco Use  . Smoking status: Never Smoker  . Smokeless tobacco: Never Used  Vaping Use  . Vaping Use: Never used  Substance and Sexual Activity  . Alcohol use: No  . Drug use: No  . Sexual activity: Not Currently  Other Topics Concern  . Not on file  Social History Narrative   Lives at  home by herself, aide in the am.     Has 4 daughters and 3 living sons (one son died).    Uses walker.     Divorced at age 58   Social Determinants of Health   Financial Resource Strain: Not on file  Food Insecurity: Not on file  Transportation Needs: Not on file  Physical Activity: Not on file  Stress: Not on file  Social Connections: Not on file  Intimate Partner Violence: Not on file    Past Surgical History:  Procedure Laterality Date  . ABDOMINAL HYSTERECTOMY    . BACK SURGERY    . CHOLECYSTECTOMY    . COLONOSCOPY  09/04/2001   Two rectal polyps. Small internal hemorrhoids  . ESOPHAGOGASTRODUODENOSCOPY  09/04/2001   Two apparent antral polyps with one possible gastroesophageal juction polyp versus enlarged fold or focal gastritis  . KNEE SURGERY      Family History  Problem Relation Age of Onset  . Diabetes Son   . Hypertension Son   . Cancer Sister        breast  . Diabetes Daughter        plus 2 grandkids  . Stroke Son        37mo age dx, has  enlarged heart  . Heart disease Son        passed away    Allergies  Allergen Reactions  . Buprenorphine Hcl Rash  . Morphine And Related Rash    Current Outpatient Medications on File Prior to Visit  Medication Sig Dispense Refill  . Accu-Chek FastClix Lancets MISC USE AS DIRECTED TO CHECK  BLOOD SUGAR 102 each 3  . acetaminophen (TYLENOL) 500 MG tablet Take 2 tablets (1,000 mg total) by mouth 2 (two) times daily as needed for moderate pain. 30 tablet 0  . albuterol (VENTOLIN HFA) 108 (90 Base) MCG/ACT inhaler USE 2 INHALATIONS BY MOUTH  EVERY 6 HOURS AS NEEDED FOR WHEEZING OR SHORTNESS OF  BREATH 34 g 2  . aspirin EC 81 MG tablet Take 81 mg by mouth daily.    Marland Kitchen atorvastatin (LIPITOR) 10 MG tablet TAKE 1 TABLET BY MOUTH  DAILY 90 tablet 3  . Cholecalciferol (VITAMIN D3) 75 MCG (3000 UT) TABS Take 1 tablet by mouth daily. 90 tablet 3  . fluticasone (FLONASE) 50 MCG/ACT nasal spray USE 2 SPRAYS IN BOTH  NOSTRILS  DAILY 48 g 3  . glucose blood (ACCU-CHEK GUIDE) test strip Use as instructed 100 each 12  . lisinopril (ZESTRIL) 5 MG tablet TAKE 1 TABLET BY MOUTH  DAILY 90 tablet 3  . metFORMIN (GLUCOPHAGE) 500 MG tablet TAKE 1 TABLET BY MOUTH  TWICE DAILY WITH A MEAL 180 tablet 3  . mometasone-formoterol (DULERA) 200-5 MCG/ACT AERO USE 1 INHALATION BY MOUTH  EVERY 12 HOURS 26 g 1  . Multiple Vitamins-Minerals (CENTRUM SILVER 50+WOMEN) TABS Take 1 tablet by mouth daily.    . nitroGLYCERIN (NITROSTAT) 0.4 MG SL tablet DISSOLVE 1 TABLET UNDER THE TONGUE EVERY 5 MINUTES AS  NEEDED FOR CHEST PAIN. MAX  OF 3 TABLETS IN 15 MINUTES. CALL 911 IF PAIN PERSISTS. 25 tablet 1  . omeprazole (PRILOSEC) 20 MG capsule TAKE 1 CAPSULE BY MOUTH  DAILY 90 capsule 3   No current facility-administered medications on file prior to visit.    BP (!) 142/46 (BP Location: Right Arm, Patient Position: Sitting, Cuff Size: Small)   Pulse 74   Temp 98.1 F (36.7 C) (Oral)   Resp 16   Ht 5\' 2"  (1.575 m)   Wt 156 lb (70.8 kg)   SpO2 100%   BMI 28.53 kg/m       Objective:   Physical Exam Constitutional:      Appearance: She is well-developed and well-nourished.  Neck:     Thyroid: No thyromegaly.  Cardiovascular:     Rate and Rhythm: Normal rate and regular rhythm.     Heart sounds: Normal heart sounds. No murmur heard.   Pulmonary:     Effort: Pulmonary effort is normal. No respiratory distress.     Breath sounds: Normal breath sounds. No wheezing.  Musculoskeletal:     Cervical back: Neck supple.     Right lower leg: 3+ Edema present.     Left lower leg: 3+ Edema present.     Comments: Decreased flexion/extension of both knees.   Skin:    General: Skin is warm and dry.     Comments: No skin breakdown noted.   Neurological:     Mental Status: She is alert and oriented to person, place, and time.     Comments: Bilateral UE/LE strength is 4-5/5  Pt has a stooped gait, shuffling gait and must lean heavily with her  forearms on the rollator handles to  support herself  She has normal grip strength bilaterally  She requires moderate assistance with walking  Psychiatric:        Mood and Affect: Mood and affect normal.        Behavior: Behavior normal.        Thought Content: Thought content normal.        Judgment: Judgment normal.           Assessment & Plan:  Mobility examination-this patient would benefit from the use of a power mobility device due to difficulty ambulating with Rollator.  The power mobility device would help her get to and from the kitchen to obtain food and meals that have been prepared for her.  She is unable to cook on her own.  She would also benefit from a personal mobility device to get to and from her bedroom to groom and dress.  Her cane and walker will not medically meet her mobility needs currently because of low back pain and difficulty standing upright.  She is also requiring use of both hands to support herself while standing which makes it impossible for her to do things such as open the refrigerator to take out food safely.  She does not have the upper body strength to safely maneuver and get in and out of a manual wheelchair.    I do think that she could safely maneuver a motorized scooter. She does have the physical and mental abilities to operate a power mobility device.  She is willing and motivated to use a power mobility device.  She also needs additional assistance in the home to assist with her ADL's as she has had progressive weakness and mobility decline.  Hx of angina- she has not seen cardiology in several years. Will arrange a follow up visit with cardiolog.  This visit occurred during the SARS-CoV-2 public health emergency.  Safety protocols were in place, including screening questions prior to the visit, additional usage of staff PPE, and extensive cleaning of exam room while observing appropriate contact time as indicated for disinfecting solutions.     47 minutes were spent on today's visit. Time was spent reviewing paperwork, interviewing patient and documenting details necessary for her applications.

## 2020-06-07 ENCOUNTER — Other Ambulatory Visit (HOSPITAL_BASED_OUTPATIENT_CLINIC_OR_DEPARTMENT_OTHER): Payer: Self-pay | Admitting: Family

## 2020-06-07 DIAGNOSIS — Z1231 Encounter for screening mammogram for malignant neoplasm of breast: Secondary | ICD-10-CM

## 2020-06-07 NOTE — Telephone Encounter (Signed)
Ov note mailed to address provided with 2 set of papers brought in by patient's daughter.

## 2020-06-12 ENCOUNTER — Emergency Department (HOSPITAL_COMMUNITY): Payer: Medicare Other

## 2020-06-12 ENCOUNTER — Other Ambulatory Visit: Payer: Self-pay

## 2020-06-12 ENCOUNTER — Encounter (HOSPITAL_COMMUNITY): Payer: Self-pay | Admitting: *Deleted

## 2020-06-12 ENCOUNTER — Emergency Department (HOSPITAL_COMMUNITY)
Admission: EM | Admit: 2020-06-12 | Discharge: 2020-06-12 | Disposition: A | Discharge status: Home / Self Care | Payer: Medicare Other | Attending: Emergency Medicine | Admitting: Emergency Medicine

## 2020-06-12 DIAGNOSIS — S0990XA Unspecified injury of head, initial encounter: Secondary | ICD-10-CM | POA: Diagnosis not present

## 2020-06-12 DIAGNOSIS — R519 Headache, unspecified: Secondary | ICD-10-CM | POA: Diagnosis not present

## 2020-06-12 DIAGNOSIS — Z7982 Long term (current) use of aspirin: Secondary | ICD-10-CM | POA: Diagnosis not present

## 2020-06-12 DIAGNOSIS — R6889 Other general symptoms and signs: Secondary | ICD-10-CM | POA: Diagnosis not present

## 2020-06-12 DIAGNOSIS — S8991XA Unspecified injury of right lower leg, initial encounter: Secondary | ICD-10-CM | POA: Diagnosis present

## 2020-06-12 DIAGNOSIS — S0081XA Abrasion of other part of head, initial encounter: Secondary | ICD-10-CM | POA: Diagnosis not present

## 2020-06-12 DIAGNOSIS — R0789 Other chest pain: Secondary | ICD-10-CM | POA: Diagnosis not present

## 2020-06-12 DIAGNOSIS — Z743 Need for continuous supervision: Secondary | ICD-10-CM | POA: Diagnosis not present

## 2020-06-12 DIAGNOSIS — W19XXXA Unspecified fall, initial encounter: Secondary | ICD-10-CM | POA: Diagnosis not present

## 2020-06-12 DIAGNOSIS — S8002XA Contusion of left knee, initial encounter: Secondary | ICD-10-CM | POA: Insufficient documentation

## 2020-06-12 DIAGNOSIS — I5032 Chronic diastolic (congestive) heart failure: Secondary | ICD-10-CM | POA: Insufficient documentation

## 2020-06-12 DIAGNOSIS — Z7951 Long term (current) use of inhaled steroids: Secondary | ICD-10-CM | POA: Insufficient documentation

## 2020-06-12 DIAGNOSIS — R079 Chest pain, unspecified: Secondary | ICD-10-CM | POA: Diagnosis not present

## 2020-06-12 DIAGNOSIS — S8001XA Contusion of right knee, initial encounter: Secondary | ICD-10-CM | POA: Insufficient documentation

## 2020-06-12 DIAGNOSIS — E119 Type 2 diabetes mellitus without complications: Secondary | ICD-10-CM | POA: Diagnosis not present

## 2020-06-12 DIAGNOSIS — Z79899 Other long term (current) drug therapy: Secondary | ICD-10-CM | POA: Insufficient documentation

## 2020-06-12 DIAGNOSIS — Z7984 Long term (current) use of oral hypoglycemic drugs: Secondary | ICD-10-CM | POA: Insufficient documentation

## 2020-06-12 DIAGNOSIS — J45909 Unspecified asthma, uncomplicated: Secondary | ICD-10-CM | POA: Insufficient documentation

## 2020-06-12 DIAGNOSIS — I110 Hypertensive heart disease with heart failure: Secondary | ICD-10-CM | POA: Diagnosis not present

## 2020-06-12 DIAGNOSIS — I4510 Unspecified right bundle-branch block: Secondary | ICD-10-CM | POA: Diagnosis not present

## 2020-06-12 LAB — BASIC METABOLIC PANEL WITH GFR
Anion gap: 11 (ref 5–15)
BUN: 19 mg/dL (ref 8–23)
CO2: 23 mmol/L (ref 22–32)
Calcium: 9.1 mg/dL (ref 8.9–10.3)
Chloride: 104 mmol/L (ref 98–111)
Creatinine, Ser: 0.97 mg/dL (ref 0.44–1.00)
GFR, Estimated: 56 mL/min — ABNORMAL LOW
Glucose, Bld: 106 mg/dL — ABNORMAL HIGH (ref 70–99)
Potassium: 4.1 mmol/L (ref 3.5–5.1)
Sodium: 138 mmol/L (ref 135–145)

## 2020-06-12 LAB — CBC
HCT: 33.4 % — ABNORMAL LOW (ref 36.0–46.0)
Hemoglobin: 10.1 g/dL — ABNORMAL LOW (ref 12.0–15.0)
MCH: 29.3 pg (ref 26.0–34.0)
MCHC: 30.2 g/dL (ref 30.0–36.0)
MCV: 96.8 fL (ref 80.0–100.0)
Platelets: 199 10*3/uL (ref 150–400)
RBC: 3.45 MIL/uL — ABNORMAL LOW (ref 3.87–5.11)
RDW: 14.2 % (ref 11.5–15.5)
WBC: 5 10*3/uL (ref 4.0–10.5)
nRBC: 0 % (ref 0.0–0.2)

## 2020-06-12 LAB — TROPONIN I (HIGH SENSITIVITY)
Troponin I (High Sensitivity): 3 ng/L
Troponin I (High Sensitivity): 4 ng/L (ref <18)

## 2020-06-12 MED ORDER — ALUM & MAG HYDROXIDE-SIMETH 200-200-20 MG/5ML PO SUSP
30.0000 mL | Freq: Once | ORAL | Status: AC
  Administered 2020-06-12: 30 mL via ORAL
  Filled 2020-06-12: qty 30

## 2020-06-12 NOTE — ED Notes (Signed)
Pt given turkey sandwich and coke 

## 2020-06-12 NOTE — ED Triage Notes (Signed)
Patient presents to ed via [facility] states she is having a headache and chest pain under her left breast onset 1 hours ago. States she fell 2 days ago however wasn't seen however states she doesn't know why she fell.

## 2020-06-12 NOTE — ED Provider Notes (Signed)
[facility] EMERGENCY DEPARTMENT Provider Note   CSN: [id number] Arrival date & time: 06/12/20  1500     History Chief Complaint  Patient presents with  . Headache  . Chest Pain    Angelica Benson is a 87 y.o. female.  87 yo F with a chief complaints of headache and left-sided chest pain.  Patient fell couple days ago.  She is not sure why she fell but it sounds like she lost her balance and then collapsed to the ground.  Struck her head and her knees on the ground.  Had trouble getting off the ground initially but after she was assisted up she has been able to ambulate.  She denies neck pain denies back pain denies abdominal pain.  She denied any injury to her chest.  Just prior to arrival here she developed some left-sided sharp chest pain that was pinpoint.  Resolved by the time EMS it showed.  Denied any shortness of breath with this.  Occurred at rest.  She has had pain to her chest before when she tries to lift heavy objects.  She is unsure what made it better or worse.  The history is provided by the patient.  Headache Associated symptoms: no congestion, no dizziness, no fever, no myalgias, no nausea and no vomiting   Chest Pain Associated symptoms: headache   Associated symptoms: no dizziness, no fever, no nausea, no palpitations, no shortness of breath and no vomiting   Illness Severity:  Moderate Onset quality:  Gradual Duration:  2 days Timing:  Constant Progression:  Partially resolved Chronicity:  New Associated symptoms: chest pain and headaches   Associated symptoms: no congestion, no fever, no myalgias, no nausea, no rhinorrhea, no shortness of breath, no vomiting and no wheezing        Past Medical History:  Diagnosis Date  . Arthritis   . Asthma   . CHF (congestive heart failure) ([place])   . Chronic headaches   . Constipation   . Diabetes mellitus without complication ([place])   . Hemorrhoids without complication   . Hypertension      Patient Active Problem List   Diagnosis Date Noted  . Dizziness 10/02/2017  . Liver mass 10/02/2017  . Orthostatic hypotension 10/02/2017  . Left flank pain 10/02/2017  . Arthritis 02/19/2017  . Hypertension   . Diabetes mellitus without complication ([place])   . Chronic headaches   . CHF (congestive heart failure) ([place])   . Asthma   . Carpal tunnel syndrome 09/04/2015  . Bilateral sensorineural hearing loss 06/07/2015  . Cervical disc disorder with radiculopathy 06/07/2015  . Cervical disc disorder with radiculopathy of cervical region   . TIA (transient ischemic attack) 03/22/2015  . Temporary cerebral vascular dysfunction 03/22/2015  . Obesity, unspecified 08/18/2013  . Goiter 08/18/2013  . Anemia, chronic disease 08/18/2013  . Anemia 06/14/2013  . Type 2 diabetes mellitus ([place]) 06/14/2013  . Chronic diastolic heart failure ([place]) 04/05/2010  . Benign essential hypertension 04/05/2010    Past Surgical History:  Procedure Laterality Date  . ABDOMINAL HYSTERECTOMY    . BACK SURGERY    . CHOLECYSTECTOMY    . COLONOSCOPY  09/04/2001   Two rectal polyps. Small internal hemorrhoids  . ESOPHAGOGASTRODUODENOSCOPY  09/04/2001   Two apparent antral polyps with one possible gastroesophageal juction polyp versus enlarged fold or focal gastritis  . KNEE SURGERY       OB History   No obstetric history on file.  Family History  Problem Relation Age of Onset  . Diabetes Son   . Hypertension Son   . Cancer Sister        breast  . Diabetes Daughter        plus 2 grandkids  . Stroke Son        74mo age dx, has enlarged heart  . Heart disease Son        passed away    Social History   Tobacco Use  . Smoking status: Never Smoker  . Smokeless tobacco: Never Used  Vaping Use  . Vaping Use: Never used  Substance Use Topics  . Alcohol use: No  . Drug use: No    Home Medications Prior to Admission medications   Medication Sig Start Date End Date Taking?  Authorizing Provider  Accu-Chek FastClix Lancets MISC USE AS DIRECTED TO CHECK  BLOOD SUGAR 11/23/19   Debbrah Alar, NP  acetaminophen (TYLENOL) 500 MG tablet Take 2 tablets (1,000 mg total) by mouth 2 (two) times daily as needed for moderate pain. 08/15/17   Debbrah Alar, NP  albuterol (VENTOLIN HFA) 108 (90 Base) MCG/ACT inhaler USE 2 INHALATIONS BY MOUTH  EVERY 6 HOURS AS NEEDED FOR WHEEZING OR SHORTNESS OF  BREATH 11/23/19   Debbrah Alar, NP  aspirin EC 81 MG tablet Take 81 mg by mouth daily.    [provider]  atorvastatin (LIPITOR) 10 MG tablet TAKE 1 TABLET BY MOUTH  DAILY 08/05/19   Debbrah Alar, NP  Cholecalciferol (VITAMIN D3) 75 MCG (3000 UT) TABS Take 1 tablet by mouth daily. 10/29/18   Debbrah Alar, NP  fluticasone (FLONASE) 50 MCG/ACT nasal spray USE 2 SPRAYS IN BOTH  NOSTRILS DAILY 11/23/19   Debbrah Alar, NP  glucose blood (ACCU-CHEK GUIDE) test strip Use as instructed 02/24/20   Debbrah Alar, NP  lisinopril (ZESTRIL) 5 MG tablet TAKE 1 TABLET BY MOUTH  DAILY 08/05/19   Debbrah Alar, NP  metFORMIN (GLUCOPHAGE) 500 MG tablet TAKE 1 TABLET BY MOUTH  TWICE DAILY WITH A MEAL 11/23/19   Debbrah Alar, NP  mometasone-formoterol (DULERA) 200-5 MCG/ACT AERO USE 1 INHALATION BY MOUTH  EVERY 12 HOURS 10/30/19   Debbrah Alar, NP  Multiple Vitamins-Minerals (CENTRUM SILVER 50+WOMEN) TABS Take 1 tablet by mouth daily. 04/28/18   Debbrah Alar, NP  nitroGLYCERIN (NITROSTAT) 0.4 MG SL tablet DISSOLVE 1 TABLET UNDER THE TONGUE EVERY 5 MINUTES AS  NEEDED FOR CHEST PAIN. MAX  OF 3 TABLETS IN 15 MINUTES. CALL 911 IF PAIN PERSISTS. 05/15/20   Debbrah Alar, NP  omeprazole (PRILOSEC) 20 MG capsule TAKE 1 CAPSULE BY MOUTH  DAILY 04/16/20   Debbrah Alar, NP  Zoster Vaccine Adjuvanted [facility]) injection Inject 0.5mg  IM now and again in 2-6 months. 06/06/20   Debbrah Alar, NP    Allergies    Buprenorphine hcl and  Morphine and related  Review of Systems   Review of Systems  Constitutional: Negative for chills and fever.  HENT: Negative for congestion and rhinorrhea.   Eyes: Negative for redness and visual disturbance.  Respiratory: Negative for shortness of breath and wheezing.   Cardiovascular: Positive for chest pain. Negative for palpitations.  Gastrointestinal: Negative for nausea and vomiting.  Genitourinary: Negative for dysuria and urgency.  Musculoskeletal: Negative for arthralgias and myalgias.  Skin: Negative for pallor and wound.  Neurological: Positive for headaches. Negative for dizziness.    Physical Exam Updated Vital Signs BP (!) 142/66   Pulse 60   Temp 98.6 F (  37 C) (Oral)   Resp (!) 24   Ht 5\' 1"  (1.549 m)   Wt 68 kg   SpO2 100%   BMI 28.34 kg/m   Physical Exam Vitals and nursing note reviewed.  Constitutional:      General: She is not in acute distress.    Appearance: She is well-developed and well-nourished. She is not diaphoretic.  HENT:     Head: Normocephalic.     Comments: Abrasion to the left frontal region. Eyes:     Extraocular Movements: EOM normal.     Pupils: Pupils are equal, round, and reactive to light.  Cardiovascular:     Rate and Rhythm: Normal rate and regular rhythm.     Heart sounds: No murmur heard. No friction rub. No gallop.   Pulmonary:     Effort: Pulmonary effort is normal.     Breath sounds: No wheezing or rales.  Chest:     Chest wall: Tenderness (pain to the L chest pain) present.  Abdominal:     General: There is no distension.     Palpations: Abdomen is soft.     Tenderness: There is no abdominal tenderness.  Musculoskeletal:        General: Tenderness present. No edema.     Cervical back: Normal range of motion and neck supple.     Comments: Palpated from head to toe without any noted areas of bony tenderness.  Full range of motion of the knees bilaterally.  Mild bruising to the knees bilaterally.  Skin:    General:  Skin is warm and dry.  Neurological:     Mental Status: She is alert and oriented to person, place, and time.  Psychiatric:        Mood and Affect: Mood and affect normal.        Behavior: Behavior normal.     ED Results / Procedures / Treatments   Labs (all labs ordered are listed, but only abnormal results are displayed) Labs Reviewed  BASIC METABOLIC PANEL - Abnormal; Notable for the following components:      Result Value   Glucose, Bld 106 (*)    GFR, Estimated 56 (*)    All other components within normal limits  CBC - Abnormal; Notable for the following components:   RBC 3.45 (*)    Hemoglobin 10.1 (*)    HCT 33.4 (*)    All other components within normal limits  TROPONIN I (HIGH SENSITIVITY)  TROPONIN I (HIGH SENSITIVITY)    EKG EKG Interpretation  Date/Time:  Monday June 12 2020 15:40:39 EST Ventricular Rate:  64 PR Interval:    QRS Duration: 143 QT Interval:  421 QTC Calculation: 435 R Axis:   -73 Text Interpretation: Sinus rhythm Prolonged PR interval RBBB and LAFB No significant change since last tracing Confirmed by Deno Etienne [phone]) on 06/12/2020 3:47:06 PM   Radiology CT Head Wo Contrast  Result Date: 06/12/2020 CLINICAL DATA:  Head trauma.  Headache EXAM: CT HEAD WITHOUT CONTRAST TECHNIQUE: Contiguous axial images were obtained from the base of the skull through the vertex without intravenous contrast. COMPARISON:  CT head 11/16/2019 FINDINGS: Brain: Mild atrophy. Patchy white matter hypodensity bilaterally unchanged most consistent with chronic microvascular ischemia. Negative for acute infarct, hemorrhage, mass. Vascular: Negative for hyperdense vessel Skull: Negative Sinuses/Orbits: Mucosal edema right maxillary sinus. Air-fluid level sphenoid sinus. Negative orbit Other: None IMPRESSION: Atrophy and white matter hypodensity consistent with chronic microvascular ischemia. No acute abnormality. Air-fluid level sphenoid sinus. Mucosal  edema right  maxillary sinus. Electronically Signed   By: Franchot Gallo M.D.   On: 06/12/2020 16:12   DG Chest Portable 1 View  Result Date: 06/12/2020 CLINICAL DATA:  Headache, left-sided chest pain for 1 hour, fell 2 days ago EXAM: PORTABLE CHEST 1 VIEW COMPARISON:  10/03/2017 FINDINGS: Single frontal view of the chest demonstrates an unremarkable cardiac silhouette. No acute airspace disease, effusion, or pneumothorax. No acute displaced fractures. IMPRESSION: 1. No acute intrathoracic process. Electronically Signed   By: Randa Ngo M.D.   On: 06/12/2020 16:37    Procedures Procedures   Medications Ordered in ED Medications  alum & mag hydroxide-simeth (MAALOX/MYLANTA) 200-200-20 MG/5ML suspension 30 mL (30 mLs Oral Given 06/12/20 1620)    ED Course  I have reviewed the triage vital signs and the nursing notes.  Pertinent labs & imaging results that were available during my care of the patient were reviewed by me and considered in my medical decision making (see chart for details).    MDM Rules/Calculators/A&P                          87 yo F with a chief complaint of a fall.  Happened a couple days ago.  She struck her head on the ground.  Denies any confusion or vomiting.  She unfortunately developed chest pain just prior to arrival which lasted for a very short amount of time and is now resolved.  She does have some left-sided chest pain on exam.  Will obtain a delta troponin.  CT of the head.  Delta troponin is negative.  CT of the head negative.  No significant anemia.  Chest x-ray viewed by me without focal infiltrate or pneumothorax.  No obvious rib fracture.  Will have the patient follow-up with your family doctor.  7:35 PM:  I have discussed the diagnosis/risks/treatment options with the patient and believe the pt to be eligible for discharge home to follows-up with PCP. We also discussed returning to the ED immediately if new or worsening sx occur. We discussed the sx which are most  concerning (e.g., sudden worsening pain, fever, inability to tolerate by mouth) that necessitate immediate return. Medications administered to the patient during their visit and any new prescriptions provided to the patient are listed below.  Medications given during this visit Medications  alum & mag hydroxide-simeth (MAALOX/MYLANTA) 200-200-20 MG/5ML suspension 30 mL (30 mLs Oral Given 06/12/20 1620)     The patient appears reasonably screen and/or stabilized for discharge and I doubt any other medical condition or other [facility] requiring further screening, evaluation, or treatment in the ED at this time prior to discharge.     Final Clinical Impression(s) / ED Diagnoses Final diagnoses:  Atypical chest pain  Fall, initial encounter  Closed head injury, initial encounter    Rx / DC Orders ED Discharge Orders    None       Deno Etienne, DO 06/12/20 1935

## 2020-06-12 NOTE — ED Notes (Signed)
Morley Kos (Daughter#[phone]) called for an updated on patient's status.

## 2020-06-12 NOTE — Discharge Instructions (Signed)
Your CT scan of your head was normal.  The markers that look for heart damage were both negative.  It is very unlikely that this is a heart attack.  Please follow-up with your family doctor in the office. Return for worsening confusion, vomiting.

## 2020-06-20 ENCOUNTER — Ambulatory Visit (HOSPITAL_BASED_OUTPATIENT_CLINIC_OR_DEPARTMENT_OTHER): Payer: Medicare Other

## 2020-06-20 ENCOUNTER — Other Ambulatory Visit: Payer: Self-pay | Admitting: Family

## 2020-06-21 ENCOUNTER — Ambulatory Visit: Payer: Medicare Other | Admitting: Podiatry

## 2020-06-21 ENCOUNTER — Telehealth: Payer: Self-pay | Admitting: Family

## 2020-06-21 NOTE — Telephone Encounter (Signed)
Called [employer] to let them know we have not receive forms for electric wheelchair. They will fax again. Marland Kitchen

## 2020-06-21 NOTE — Telephone Encounter (Signed)
Angelica Benson, [employer]  Call Back @ [phone]    [employer] called in reference to documentation sent to our office to fill out on 06/06/20 for electric wheelchair, Angelica Benson is calling to check the status.  Please Advise

## 2020-06-29 ENCOUNTER — Encounter: Payer: Self-pay | Admitting: Family

## 2020-06-30 NOTE — Telephone Encounter (Signed)
CallerTracey Harries Call bak [phone]  Checking the status of paperwork

## 2020-07-05 NOTE — Telephone Encounter (Signed)
Spoke to patient's daughter Vita Barley, she had the forms we filled out. She will call [employer] and let us know if we need to do anything else,

## 2020-07-10 NOTE — Progress Notes (Signed)
Referring-Melissa Inda Castle NP Reason for referral-chest pain  HPI: 87 year old female for evaluation of chest pain at request of [name].  Echocardiogram December 2016 showed normal LV function, mild mitral regurgitation, mild right ventricular enlargement.  Carotid Dopplers December 2016 showed 1 to 39% bilateral stenosis.  Patient states she has had chest pain intermittently for approximately 10 years.  It is substernal without radiation and described as a sharp pain.  It is not pleuritic or positional.  Not exertional.  Last 20 to 30 minutes and resolve spontaneously.  There is associated diaphoresis at times.  She otherwise denies significant dyspnea on exertion, orthopnea, PND, pedal edema or syncope.  Cardiology asked to evaluate.  Current Outpatient Medications  Medication Sig Dispense Refill  . Accu-Chek FastClix Lancets MISC USE AS DIRECTED TO CHECK  BLOOD SUGAR 102 each 3  . acetaminophen (TYLENOL) 500 MG tablet Take 2 tablets (1,000 mg total) by mouth 2 (two) times daily as needed for moderate pain. 30 tablet 0  . albuterol (VENTOLIN HFA) 108 (90 Base) MCG/ACT inhaler USE 2 INHALATIONS BY MOUTH  EVERY 6 HOURS AS NEEDED FOR WHEEZING OR SHORTNESS OF  BREATH 34 g 1  . aspirin EC 81 MG tablet Take 81 mg by mouth daily.    Marland Kitchen atorvastatin (LIPITOR) 10 MG tablet Take 1 tablet (10 mg total) by mouth daily. 90 tablet 1  . Cholecalciferol (VITAMIN D3) 75 MCG (3000 UT) TABS Take 1 tablet by mouth daily. 90 tablet 3  . fluticasone (FLONASE) 50 MCG/ACT nasal spray USE 2 SPRAYS IN BOTH  NOSTRILS DAILY 48 g 3  . glucose blood (ACCU-CHEK GUIDE) test strip Use as instructed 100 each 12  . lisinopril (ZESTRIL) 5 MG tablet Take 1 tablet (5 mg total) by mouth daily. 90 tablet 1  . metFORMIN (GLUCOPHAGE) 500 MG tablet TAKE 1 TABLET BY MOUTH  TWICE DAILY WITH A MEAL 180 tablet 3  . mometasone-formoterol (DULERA) 200-5 MCG/ACT AERO USE 1 INHALATION BY MOUTH  EVERY 12 HOURS 26 g 1  .  Multiple Vitamins-Minerals (CENTRUM SILVER 50+WOMEN) TABS Take 1 tablet by mouth daily.    . nitroGLYCERIN (NITROSTAT) 0.4 MG SL tablet DISSOLVE 1 TABLET UNDER THE TONGUE EVERY 5 MINUTES AS  NEEDED FOR CHEST PAIN. MAX  OF 3 TABLETS IN 15 MINUTES. CALL 911 IF PAIN PERSISTS. 25 tablet 1  . omeprazole (PRILOSEC) 20 MG capsule TAKE 1 CAPSULE BY MOUTH  DAILY 90 capsule 3  . Zoster Vaccine Adjuvanted [facility]) injection Inject 0.5mg  IM now and again in 2-6 months. 0.5 mL 1   No current facility-administered medications for this visit.    Allergies  Allergen Reactions  . Buprenorphine Hcl Rash  . Morphine And Related Rash     Past Medical History:  Diagnosis Date  . Arthritis   . Asthma   . CHF (congestive heart failure) ([place])   . Chronic headaches   . Constipation   . Diabetes mellitus without complication ([place])   . Hemorrhoids without complication   . Hypertension     Past Surgical History:  Procedure Laterality Date  . ABDOMINAL HYSTERECTOMY    . BACK SURGERY    . CHOLECYSTECTOMY    . COLONOSCOPY  09/04/2001   Two rectal polyps. Small internal hemorrhoids  . ESOPHAGOGASTRODUODENOSCOPY  09/04/2001   Two apparent antral polyps with one possible gastroesophageal juction polyp versus enlarged fold or focal gastritis  . KNEE SURGERY      Social History   Socioeconomic History  .  Marital status: Divorced    Spouse name: Not on file  . Number of children: Not on file  . Years of education: Not on file  . Highest education level: Not on file  Occupational History  . Not on file  Tobacco Use  . Smoking status: Never Smoker  . Smokeless tobacco: Never Used  Vaping Use  . Vaping Use: Never used  Substance and Sexual Activity  . Alcohol use: No  . Drug use: No  . Sexual activity: Not Currently  Other Topics Concern  . Not on file  Social History Narrative   Lives at home by herself, aide in the am.     Has 4 daughters and 3 living sons (one son died).    Uses walker.      Divorced at age 9   Social Determinants of Health   Financial Resource Strain: Not on file  Food Insecurity: Not on file  Transportation Needs: Not on file  Physical Activity: Not on file  Stress: Not on file  Social Connections: Not on file  Intimate Partner Violence: Not on file    Family History  Problem Relation Age of Onset  . Diabetes Son   . Hypertension Son   . Cancer Sister        breast  . Diabetes Daughter        plus 2 grandkids  . Stroke Son        76mo age dx, has enlarged heart  . Heart disease Son        passed away    ROS: Knee arthralgias but no fevers or chills, productive cough, hemoptysis, dysphasia, odynophagia, melena, hematochezia, dysuria, hematuria, rash, seizure activity, orthopnea, PND, pedal edema, claudication. Remaining systems are negative.  Physical Exam:   Blood pressure 118/67, pulse 87, height 5\' 1"  (1.549 m), weight 153 lb 9.6 oz (69.7 kg), SpO2 100 %.  General:  Well developed/well nourished in NAD Skin warm/dry Patient not depressed No peripheral clubbing Back-normal HEENT-normal/normal eyelids Neck supple/normal carotid upstroke bilaterally; no bruits; no JVD; no thyromegaly chest - CTA/ normal expansion CV - RRR/normal S1 and S2; no murmurs, rubs or gallops;  PMI nondisplaced Abdomen -NT/ND, no HSM, no mass, + bowel sounds, no bruit 2+ femoral pulses, no bruits Ext-no edema, chords, 2+ DP Neuro-grossly nonfocal  ECG -June 12, 2020-sinus rhythm, first-degree AV block, left anterior fascicular block, right bundle branch block.  Personally reviewed  A/P  1 chest pain-symptoms are somewhat atypical.  They are also longstanding.  We discussed options of functional study today but we decided to be conservative and follow for now.  We can consider further evaluation in the future if her symptoms worsen.  She is in agreement with that.  I will arrange an echocardiogram to assess LV function.  2 hypertension-blood pressure  controlled.   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  She is not having syncope.  We will follow closely.  4 hyperlipidemia-continue statin.  Kirk Ruths, MD

## 2020-07-11 ENCOUNTER — Other Ambulatory Visit: Payer: Self-pay | Admitting: Family

## 2020-07-13 ENCOUNTER — Other Ambulatory Visit: Payer: Self-pay

## 2020-07-13 ENCOUNTER — Encounter: Payer: Self-pay | Admitting: Cardiology

## 2020-07-13 ENCOUNTER — Ambulatory Visit (INDEPENDENT_AMBULATORY_CARE_PROVIDER_SITE_OTHER): Payer: Medicare Other | Admitting: Cardiology

## 2020-07-13 VITALS — BP 118/67 | HR 87 | Ht 61.0 in | Wt 153.6 lb

## 2020-07-13 DIAGNOSIS — R072 Precordial pain: Secondary | ICD-10-CM | POA: Diagnosis not present

## 2020-07-13 DIAGNOSIS — I10 Essential (primary) hypertension: Secondary | ICD-10-CM

## 2020-07-13 DIAGNOSIS — E7800 Pure hypercholesterolemia, unspecified: Secondary | ICD-10-CM

## 2020-07-13 NOTE — Patient Instructions (Signed)
  Testing/Procedures:  Your physician has requested that you have an echocardiogram. Echocardiography is a painless test that uses sound waves to create images of your heart. It provides your doctor with information about the size and shape of your heart and how well your heart's chambers and valves are working. This procedure takes approximately one hour. There are no restrictions for this procedure.W[place]     Follow-Up: At [facility], you and your health needs are our priority.  As part of our continuing mission to provide you with exceptional heart care, we have created designated Provider Care Teams.  These Care Teams include your primary Cardiologist (physician) and Advanced Practice Providers (APPs -  Physician Assistants and Nurse Practitioners) who all work together to provide you with the care you need, when you need it.  We recommend signing up for the patient portal called "MyChart".  Sign up information is provided on this After Visit Summary.  MyChart is used to connect with patients for Virtual Visits (Telemedicine).  Patients are able to view lab/test results, encounter notes, upcoming appointments, etc.  Non-urgent messages can be sent to your provider as well.   To learn more about what you can do with MyChart, go to [email or web address].    Your next appointment:   6 month(s)  The format for your next appointment:   In Person  Provider:   Kirk Ruths MD

## 2020-07-18 ENCOUNTER — Ambulatory Visit (INDEPENDENT_AMBULATORY_CARE_PROVIDER_SITE_OTHER): Payer: Medicare Other | Admitting: Podiatry

## 2020-07-18 ENCOUNTER — Encounter: Payer: Self-pay | Admitting: Podiatry

## 2020-07-18 ENCOUNTER — Other Ambulatory Visit: Payer: Self-pay

## 2020-07-18 DIAGNOSIS — B351 Tinea unguium: Secondary | ICD-10-CM

## 2020-07-18 DIAGNOSIS — M79674 Pain in right toe(s): Secondary | ICD-10-CM

## 2020-07-18 DIAGNOSIS — E119 Type 2 diabetes mellitus without complications: Secondary | ICD-10-CM

## 2020-07-18 DIAGNOSIS — M79675 Pain in left toe(s): Secondary | ICD-10-CM | POA: Diagnosis not present

## 2020-07-18 NOTE — Progress Notes (Signed)
This patient returns to my office for at risk foot care.  This patient requires this care by a professional since this patient will be at risk due to having  Type 2 diabetes.  This patient is unable to cut nails herself since the patient cannot reach her nails.These nails are painful walking and wearing shoes.   Patient presents to the office with her daughter. This patient presents for at risk foot care today.  General Appearance  Alert, conversant and in no acute stress.  Vascular  Dorsalis pedis  are palpable  bilaterally.  Posterior tibial pulses are not palpable  B/L. Absent digital hair. Capillary return is within normal limits  bilaterally. Temperature is within normal limits  bilaterally.  Neurologic  Senn-Weinstein monofilament wire test diminished  bilaterally. Muscle power within normal limits bilaterally.  Nails Thick disfigured discolored nails with subungual debris  from hallux to fifth toes bilaterally. No evidence of bacterial infection or drainage bilaterally.  Orthopedic  No limitations of motion  feet .  No crepitus or effusions noted.  No bony pathology or digital deformities noted.  Skin  normotropic skin with no porokeratosis noted bilaterally.  No signs of infections or ulcers noted.     Onychomycosis  Pain in right toes  Pain in left toes  Consent was obtained for treatment procedures.   Mechanical debridement of nails 1-5  bilaterally performed with a nail nipper.  Filed with dremel without incident.    Return office visit   3 months                   Told patient to return for periodic foot care and evaluation due to potential at risk complications.   Gardiner Barefoot DPM

## 2020-08-15 ENCOUNTER — Telehealth: Payer: Self-pay | Admitting: Family

## 2020-08-15 NOTE — Telephone Encounter (Signed)
Caller: [employer]  Call Back @ [phone]  They are calling about incontinence supplies form sent over last week. Please advise if we have received form and the status of the form

## 2020-08-16 ENCOUNTER — Encounter: Payer: Self-pay | Admitting: Family

## 2020-08-16 NOTE — Telephone Encounter (Signed)
Incontinence supply forms faxed back this morning.

## 2020-08-18 ENCOUNTER — Ambulatory Visit (HOSPITAL_COMMUNITY): Payer: Medicare Other | Attending: Internal Medicine

## 2020-08-18 ENCOUNTER — Other Ambulatory Visit: Payer: Self-pay

## 2020-08-18 DIAGNOSIS — R072 Precordial pain: Secondary | ICD-10-CM | POA: Diagnosis not present

## 2020-08-18 LAB — ECHOCARDIOGRAM COMPLETE
Area-P 1/2: 2.68 cm2
S' Lateral: 2.2 cm

## 2020-08-21 ENCOUNTER — Encounter: Payer: Self-pay | Admitting: Family

## 2020-08-21 ENCOUNTER — Telehealth: Payer: Self-pay | Admitting: Family

## 2020-08-21 NOTE — Telephone Encounter (Signed)
Caller: [facility] [employer] [facility] Call back phone : [phone]  States they received paperwork, however, is missing onset date. Please update & re fax

## 2020-08-21 NOTE — Telephone Encounter (Signed)
[facility] and paperwork was received, will complete and fax back

## 2020-09-08 ENCOUNTER — Other Ambulatory Visit: Payer: Self-pay

## 2020-09-08 ENCOUNTER — Ambulatory Visit (INDEPENDENT_AMBULATORY_CARE_PROVIDER_SITE_OTHER): Payer: Medicare Other | Admitting: Family

## 2020-09-08 VITALS — BP 153/42 | HR 76 | Temp 98.3°F | Resp 16 | Wt 153.0 lb

## 2020-09-08 DIAGNOSIS — R21 Rash and other nonspecific skin eruption: Secondary | ICD-10-CM

## 2020-09-08 MED ORDER — BETAMETHASONE DIPROPIONATE 0.05 % EX CREA: TOPICAL_CREAM | Freq: Two times a day (BID) | CUTANEOUS | 0 refills | Status: DC

## 2020-09-08 NOTE — Patient Instructions (Signed)
Please use betamethasone cream twice daily as needed for rash.   Call if symptoms worsen or if symptoms fail to improve.

## 2020-09-08 NOTE — Assessment & Plan Note (Signed)
New. Improving. Rx provided for betamethasone.

## 2020-09-08 NOTE — Progress Notes (Signed)
Subjective:   By signing my name below, I, Shehryar Baig, attest that this documentation has been prepared under the direction and in the presence of [name]. 09/08/2020    Patient ID: Angelica Benson, female    DOB: 08-15-1928, 87 y.o.   MRN: [id number]  No chief complaint on file.   HPI Patient is in today for office visit. She complains of itchy rashes on her left forearm. Since she made the appointment, her rashes have improved. She has been using lotion to manage her symptoms and finds relief. She is getting her shingles and 4th Covid-19 booster vaccine at the pharmacy today, 09/08/2020.  Hypertension- She is managing her blood pressure well at this time. BP Readings from Last 3 Encounters:  09/08/20 (!) 153/42  07/13/20 118/67  06/12/20 (!) 160/80    Past Medical History:  Diagnosis Date  . Arthritis   . Asthma   . CHF (congestive heart failure) ([place])   . Chronic headaches   . Constipation   . Diabetes mellitus without complication ([place])   . Hemorrhoids without complication   . Hypertension     Past Surgical History:  Procedure Laterality Date  . ABDOMINAL HYSTERECTOMY    . BACK SURGERY    . CHOLECYSTECTOMY    . COLONOSCOPY  09/04/2001   Two rectal polyps. Small internal hemorrhoids  . ESOPHAGOGASTRODUODENOSCOPY  09/04/2001   Two apparent antral polyps with one possible gastroesophageal juction polyp versus enlarged fold or focal gastritis  . KNEE SURGERY      Family History  Problem Relation Age of Onset  . Diabetes Son   . Hypertension Son   . Cancer Sister        breast  . Diabetes Daughter        plus 2 grandkids  . Stroke Son        72mo age dx, has enlarged heart  . Heart disease Son        passed away    Social History   Socioeconomic History  . Marital status: Divorced    Spouse name: Not on file  . Number of children: Not on file  . Years of education: Not on file  . Highest education level: Not on file  Occupational  History  . Not on file  Tobacco Use  . Smoking status: Never Smoker  . Smokeless tobacco: Never Used  Vaping Use  . Vaping Use: Never used  Substance and Sexual Activity  . Alcohol use: No  . Drug use: No  . Sexual activity: Not Currently  Other Topics Concern  . Not on file  Social History Narrative   Lives at home by herself, aide in the am.     Has 4 daughters and 3 living sons (one son died).    Uses walker.     Divorced at age 41   Social Determinants of Health   Financial Resource Strain: Not on file  Food Insecurity: Not on file  Transportation Needs: Not on file  Physical Activity: Not on file  Stress: Not on file  Social Connections: Not on file  Intimate Partner Violence: Not on file    Outpatient Medications Prior to Visit  Medication Sig Dispense Refill  . Accu-Chek FastClix Lancets MISC USE AS DIRECTED TO CHECK  BLOOD SUGAR 102 each 3  . acetaminophen (TYLENOL) 500 MG tablet Take 2 tablets (1,000 mg total) by mouth 2 (two) times daily as needed for moderate pain. 30 tablet 0  .  albuterol (VENTOLIN HFA) 108 (90 Base) MCG/ACT inhaler USE 2 INHALATIONS BY MOUTH  EVERY 6 HOURS AS NEEDED FOR WHEEZING OR SHORTNESS OF  BREATH 34 g 1  . aspirin EC 81 MG tablet Take 81 mg by mouth daily.    Marland Kitchen atorvastatin (LIPITOR) 10 MG tablet Take 1 tablet (10 mg total) by mouth daily. 90 tablet 1  . Cholecalciferol (VITAMIN D3) 75 MCG (3000 UT) TABS Take 1 tablet by mouth daily. 90 tablet 3  . COVID-19 mRNA vaccine, Moderna, 100 MCG/0.5ML injection INJECT AS DIRECTED .25 mL 0  . fluticasone (FLONASE) 50 MCG/ACT nasal spray USE 2 SPRAYS IN BOTH  NOSTRILS DAILY 48 g 3  . glucose blood (ACCU-CHEK GUIDE) test strip Use as instructed 100 each 12  . influenza vaccine adjuvanted (FLUAD) 0.5 ML injection INJECT AS DIRECTED .5 mL 0  . lisinopril (ZESTRIL) 5 MG tablet Take 1 tablet (5 mg total) by mouth daily. 90 tablet 1  . metFORMIN (GLUCOPHAGE) 500 MG tablet TAKE 1 TABLET BY MOUTH  TWICE  DAILY WITH A MEAL 180 tablet 3  . mometasone-formoterol (DULERA) 200-5 MCG/ACT AERO USE 1 INHALATION BY MOUTH  EVERY 12 HOURS 26 g 1  . Multiple Vitamins-Minerals (CENTRUM SILVER 50+WOMEN) TABS Take 1 tablet by mouth daily.    . nitroGLYCERIN (NITROSTAT) 0.4 MG SL tablet DISSOLVE 1 TABLET UNDER THE TONGUE EVERY 5 MINUTES AS  NEEDED FOR CHEST PAIN. MAX  OF 3 TABLETS IN 15 MINUTES. CALL 911 IF PAIN PERSISTS. 25 tablet 1  . omeprazole (PRILOSEC) 20 MG capsule TAKE 1 CAPSULE BY MOUTH  DAILY 90 capsule 3  . Zoster Vaccine Adjuvanted [facility]) injection Inject 0.5mg  IM now and again in 2-6 months. 0.5 mL 1   No facility-administered medications prior to visit.    Allergies  Allergen Reactions  . Buprenorphine Hcl Rash  . Morphine And Related Rash    Review of Systems  Skin: Positive for itching (left forearm) and rash (left forearm).       Objective:    Physical Exam Constitutional:      General: She is not in acute distress.    Appearance: Normal appearance. She is not ill-appearing.  HENT:     Head: Normocephalic and atraumatic.     Right Ear: External ear normal.     Left Ear: External ear normal.  Eyes:     Extraocular Movements: Extraocular movements intact.     Pupils: Pupils are equal, round, and reactive to light.  Cardiovascular:     Rate and Rhythm: Normal rate and regular rhythm.  Pulmonary:     Effort: Pulmonary effort is normal.  Skin:    General: Skin is warm and dry.     Comments: Rash noted on left forearm.  Neurological:     Mental Status: She is alert and oriented to person, place, and time.  Psychiatric:        Behavior: Behavior normal.     There were no vitals taken for this visit. Wt Readings from Last 3 Encounters:  07/13/20 153 lb 9.6 oz (69.7 kg)  06/12/20 150 lb (68 kg)  06/06/20 156 lb (70.8 kg)    Diabetic Foot Exam - Simple   No data filed    Lab Results  Component Value Date   WBC 5.0 06/12/2020   HGB 10.1 (L) 06/12/2020   HCT  33.4 (L) 06/12/2020   PLT 199 06/12/2020   GLUCOSE 106 (H) 06/12/2020   CHOL 130 04/28/2020   TRIG 108.0  04/28/2020   HDL 68.90 04/28/2020   LDLCALC 39 04/28/2020   ALT 11 04/28/2020   AST 19 04/28/2020   NA 138 06/12/2020   K 4.1 06/12/2020   CL 104 06/12/2020   CREATININE 0.97 06/12/2020   BUN 19 06/12/2020   CO2 23 06/12/2020   TSH 1.48 04/28/2020   INR 1.09 10/02/2017   HGBA1C 6.2 04/28/2020   MICROALBUR <0.7 04/04/2017    Lab Results  Component Value Date   TSH 1.48 04/28/2020   Lab Results  Component Value Date   WBC 5.0 06/12/2020   HGB 10.1 (L) 06/12/2020   HCT 33.4 (L) 06/12/2020   MCV 96.8 06/12/2020   PLT 199 06/12/2020   Lab Results  Component Value Date   NA 138 06/12/2020   K 4.1 06/12/2020   CO2 23 06/12/2020   GLUCOSE 106 (H) 06/12/2020   BUN 19 06/12/2020   CREATININE 0.97 06/12/2020   BILITOT 0.4 04/28/2020   ALKPHOS 54 04/28/2020   AST 19 04/28/2020   ALT 11 04/28/2020   PROT 7.0 04/28/2020   ALBUMIN 4.0 04/28/2020   CALCIUM 9.1 06/12/2020   ANIONGAP 11 06/12/2020   GFR 53.07 (L) 04/28/2020   Lab Results  Component Value Date   CHOL 130 04/28/2020   Lab Results  Component Value Date   HDL 68.90 04/28/2020   Lab Results  Component Value Date   LDLCALC 39 04/28/2020   Lab Results  Component Value Date   TRIG 108.0 04/28/2020   Lab Results  Component Value Date   CHOLHDL 2 04/28/2020   Lab Results  Component Value Date   HGBA1C 6.2 04/28/2020       Assessment & Plan:   Problem List Items Addressed This Visit   None      No orders of the defined types were placed in this encounter.   I, [name], personally preformed the services described in this documentation.  All medical record entries made by the scribe were at my direction and in my presence.  I have reviewed the chart and discharge instructions (if applicable) and agree that the record reflects my personal performance and is accurate and  complete. 09/08/2020   I,Shehryar Baig,acting as a Education administrator for Nance Pear, NP.,have documented all relevant documentation on the behalf of Nance Pear, NP,as directed by  Nance Pear, NP while in the presence of Nance Pear, NP.   Shehryar [employer]

## 2020-09-11 ENCOUNTER — Other Ambulatory Visit: Payer: Self-pay | Admitting: Family

## 2020-09-13 ENCOUNTER — Encounter: Payer: Self-pay | Admitting: Family

## 2020-09-13 NOTE — Telephone Encounter (Signed)
[place] _from [facility] they need the actual onset date

## 2020-09-14 NOTE — Telephone Encounter (Signed)
Note from department of health and human services dated July 1st 2016 faxed to [facility] with diagnosis of urinary incontinence.

## 2020-09-25 ENCOUNTER — Telehealth: Payer: Self-pay | Admitting: Family

## 2020-09-25 DIAGNOSIS — Z0279 Encounter for issue of other medical certificate: Secondary | ICD-10-CM

## 2020-09-25 NOTE — Telephone Encounter (Signed)
Daughter Vita Barley dropped off document to be filled out by provider (Yellow large envelope - Department of Health document) Pt would like document to be mailed out to address on additional envelope with stamp inside other envelope. Document put at front office tray under providers name.

## 2020-09-26 NOTE — Telephone Encounter (Signed)
Forms placed in provider's folder

## 2020-09-29 ENCOUNTER — Encounter: Payer: Self-pay | Admitting: Family

## 2020-10-10 ENCOUNTER — Encounter: Payer: Self-pay | Admitting: Family

## 2020-10-10 NOTE — Telephone Encounter (Signed)
[name] notified form was put in mail 09-29-2020. She will check her mail box again and let me know if forms in there.

## 2020-10-17 ENCOUNTER — Encounter: Payer: Self-pay | Admitting: Family

## 2020-10-24 ENCOUNTER — Ambulatory Visit (INDEPENDENT_AMBULATORY_CARE_PROVIDER_SITE_OTHER): Payer: Medicare Other | Admitting: Podiatry

## 2020-10-24 ENCOUNTER — Other Ambulatory Visit: Payer: Self-pay

## 2020-10-24 ENCOUNTER — Encounter: Payer: Self-pay | Admitting: Podiatry

## 2020-10-24 DIAGNOSIS — M79674 Pain in right toe(s): Secondary | ICD-10-CM

## 2020-10-24 DIAGNOSIS — M79675 Pain in left toe(s): Secondary | ICD-10-CM

## 2020-10-24 DIAGNOSIS — E119 Type 2 diabetes mellitus without complications: Secondary | ICD-10-CM

## 2020-10-24 DIAGNOSIS — B351 Tinea unguium: Secondary | ICD-10-CM | POA: Diagnosis not present

## 2020-10-24 NOTE — Progress Notes (Signed)
This patient returns to my office for at risk foot care.  This patient requires this care by a professional since this patient will be at risk due to having  Type 2 diabetes.  This patient is unable to cut nails herself since the patient cannot reach her nails.These nails are painful walking and wearing shoes.   Patient presents to the office with her daughter. This patient presents for at risk foot care today.  General Appearance  Alert, conversant and in no acute stress.  Vascular  Dorsalis pedis  are palpable  bilaterally.  Posterior tibial pulses are not palpable  B/L. Absent digital hair. Capillary return is within normal limits  bilaterally. Temperature is within normal limits  bilaterally.  Neurologic  Senn-Weinstein monofilament wire test diminished  bilaterally. Muscle power within normal limits bilaterally.  Nails Thick disfigured discolored nails with subungual debris  from hallux to fifth toes bilaterally. No evidence of bacterial infection or drainage bilaterally.  Orthopedic  No limitations of motion  feet .  No crepitus or effusions noted.  No bony pathology or digital deformities noted.  Skin  normotropic skin with no porokeratosis noted bilaterally.  No signs of infections or ulcers noted.     Onychomycosis  Pain in right toes  Pain in left toes  Consent was obtained for treatment procedures.   Mechanical debridement of nails 1-5  bilaterally performed with a nail nipper.  Filed with dremel without incident.    Return office visit   3 months                   Told patient to return for periodic foot care and evaluation due to potential at risk complications.   Gardiner Barefoot DPM

## 2020-10-29 ENCOUNTER — Other Ambulatory Visit: Payer: Self-pay | Admitting: Family

## 2020-10-29 ENCOUNTER — Encounter: Payer: Self-pay | Admitting: Family

## 2020-10-30 MED ORDER — FLUTICASONE PROPIONATE 50 MCG/ACT NA SUSP: NASAL | 3 refills | Status: DC

## 2020-12-01 NOTE — Progress Notes (Deleted)
HPI:FU CP. Carotid Dopplers December 2016 showed 1 to 39% bilateral stenosis. Echo 5/22 showed normal LV function, grade 1 DD, mild MR. Since last seen,   Current Outpatient Medications  Medication Sig Dispense Refill   Accu-Chek FastClix Lancets MISC USE AS DIRECTED TO CHECK  BLOOD SUGAR 102 each 3   acetaminophen (TYLENOL) 500 MG tablet Take 2 tablets (1,000 mg total) by mouth 2 (two) times daily as needed for moderate pain. 30 tablet 0   albuterol (VENTOLIN HFA) 108 (90 Base) MCG/ACT inhaler USE 2 INHALATIONS BY MOUTH  EVERY 6 HOURS AS NEEDED FOR WHEEZING OR SHORTNESS OF  BREATH 34 g 1   aspirin EC 81 MG tablet Take 81 mg by mouth daily.     atorvastatin (LIPITOR) 10 MG tablet Take 1 tablet (10 mg total) by mouth daily. 90 tablet 1   betamethasone dipropionate 0.05 % cream Apply topically 2 (two) times daily. 30 g 0   Cholecalciferol (VITAMIN D3) 75 MCG (3000 UT) TABS Take 1 tablet by mouth daily. 90 tablet 3   COVID-19 mRNA vaccine, Moderna, 100 MCG/0.5ML injection INJECT AS DIRECTED .25 mL 0   fluticasone (FLONASE) 50 MCG/ACT nasal spray USE 2 SPRAYS IN BOTH  NOSTRILS DAILY 48 g 3   glucose blood (ACCU-CHEK GUIDE) test strip Use as instructed 100 each 12   influenza vaccine adjuvanted (FLUAD) 0.5 ML injection INJECT AS DIRECTED .5 mL 0   lisinopril (ZESTRIL) 5 MG tablet Take 1 tablet (5 mg total) by mouth daily. 90 tablet 1   metFORMIN (GLUCOPHAGE) 500 MG tablet TAKE 1 TABLET BY MOUTH  TWICE DAILY WITH A MEAL 180 tablet 1   mometasone-formoterol (DULERA) 200-5 MCG/ACT AERO USE 1 INHALATION BY MOUTH  EVERY 12 HOURS 26 g 1   Multiple Vitamins-Minerals (CENTRUM SILVER 50+WOMEN) TABS Take 1 tablet by mouth daily.     nitroGLYCERIN (NITROSTAT) 0.4 MG SL tablet DISSOLVE 1 TABLET UNDER THE TONGUE EVERY 5 MINUTES AS  NEEDED FOR CHEST PAIN. MAX  OF 3 TABLETS IN 15 MINUTES. CALL 911 IF PAIN PERSISTS. 25 tablet 1   omeprazole (PRILOSEC) 20 MG capsule TAKE 1 CAPSULE BY MOUTH  DAILY 90 capsule  3   Zoster Vaccine Adjuvanted [facility]) injection Inject 0.'5mg'$  IM now and again in 2-6 months. 0.5 mL 1   No current facility-administered medications for this visit.     Past Medical History:  Diagnosis Date   Arthritis    CHF (congestive heart failure) ([facility])    Chronic asthma    Chronic headaches    Constipation    Diabetes mellitus without complication ([place])    Hemorrhoids without complication    Hypertension     Past Surgical History:  Procedure Laterality Date   ABDOMINAL HYSTERECTOMY     BACK SURGERY     CHOLECYSTECTOMY     COLONOSCOPY  09/04/2001   Two rectal polyps. Small internal hemorrhoids   ESOPHAGOGASTRODUODENOSCOPY  09/04/2001   Two apparent antral polyps with one possible gastroesophageal juction polyp versus enlarged fold or focal gastritis   KNEE SURGERY      Social History   Socioeconomic History   Marital status: Divorced    Spouse name: Not on file   Number of children: Not on file   Years of education: Not on file   Highest education level: Not on file  Occupational History   Not on file  Tobacco Use   Smoking status: Never   Smokeless tobacco: Never  Vaping Use  Vaping Use: Never used  Substance and Sexual Activity   Alcohol use: No   Drug use: No   Sexual activity: Not Currently  Other Topics Concern   Not on file  Social History Narrative   Lives at home by herself, aide in the am.     Has 4 daughters and 3 living sons (one son died).    Uses walker.     Divorced at age 4   Social Determinants of Health   Financial Resource Strain: Not on file  Food Insecurity: Not on file  Transportation Needs: Not on file  Physical Activity: Not on file  Stress: Not on file  Social Connections: Not on file  Intimate Partner Violence: Not on file    Family History  Problem Relation Age of Onset   Diabetes Son    Hypertension Son    Cancer Sister        breast   Diabetes Daughter        plus 2 grandkids   Stroke Son        46mo age dx, has enlarged heart   Heart disease Son        passed away    ROS: no fevers or chills, productive cough, hemoptysis, dysphasia, odynophagia, melena, hematochezia, dysuria, hematuria, rash, seizure activity, orthopnea, PND, pedal edema, claudication. Remaining systems are negative.  Physical Exam: Well-developed well-nourished in no acute distress.  Skin is warm and dry.  HEENT is normal.  Neck is supple.  Chest is clear to auscultation with normal expansion.  Cardiovascular exam is regular rate and rhythm.  Abdominal exam nontender or distended. No masses palpated. Extremities show no edema. neuro grossly intact  ECG- personally reviewed  A/P  1 chest pain-no further symptoms since last office visit in March.  Echocardiogram showed normal LV function.  No plans for further ischemia evaluation.  2 hypertension-patient's blood pressure is controlled.  3 hyperlipidemia-continue statin.  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  We will follow.  BKirk Ruths MD

## 2020-12-06 ENCOUNTER — Other Ambulatory Visit: Payer: Self-pay | Admitting: Family

## 2020-12-12 ENCOUNTER — Ambulatory Visit: Payer: Medicare Other | Admitting: Cardiology

## 2021-01-30 ENCOUNTER — Other Ambulatory Visit: Payer: Self-pay

## 2021-01-30 ENCOUNTER — Ambulatory Visit (INDEPENDENT_AMBULATORY_CARE_PROVIDER_SITE_OTHER): Payer: Medicare Other | Admitting: Podiatry

## 2021-01-30 ENCOUNTER — Encounter: Payer: Self-pay | Admitting: Podiatry

## 2021-01-30 DIAGNOSIS — E119 Type 2 diabetes mellitus without complications: Secondary | ICD-10-CM

## 2021-01-30 DIAGNOSIS — M79675 Pain in left toe(s): Secondary | ICD-10-CM | POA: Diagnosis not present

## 2021-01-30 DIAGNOSIS — B351 Tinea unguium: Secondary | ICD-10-CM

## 2021-01-30 DIAGNOSIS — M79674 Pain in right toe(s): Secondary | ICD-10-CM | POA: Diagnosis not present

## 2021-01-30 NOTE — Progress Notes (Signed)
This patient returns to my office for at risk foot care.  This patient requires this care by a professional since this patient will be at risk due to having  Type 2 diabetes.  This patient is unable to cut nails herself since the patient cannot reach her nails.These nails are painful walking and wearing shoes.   Patient presents to the office with her daughter. This patient presents for at risk foot care today.  General Appearance  Alert, conversant and in no acute stress.  Vascular  Dorsalis pedis  are palpable  bilaterally.  Posterior tibial pulses are not palpable  B/L. Absent digital hair. Capillary return is within normal limits  bilaterally. Temperature is within normal limits  bilaterally.  Neurologic  Senn-Weinstein monofilament wire test diminished  bilaterally. Muscle power within normal limits bilaterally.  Nails Thick disfigured discolored nails with subungual debris  from hallux to fifth toes bilaterally. No evidence of bacterial infection or drainage bilaterally.  Orthopedic  No limitations of motion  feet .  No crepitus or effusions noted.  No bony pathology or digital deformities noted.  Skin  normotropic skin with no porokeratosis noted bilaterally.  No signs of infections or ulcers noted.     Onychomycosis  Pain in right toes  Pain in left toes  Consent was obtained for treatment procedures.   Mechanical debridement of nails 1-5  bilaterally performed with a nail nipper.  Filed with dremel without incident.    Return office visit   3 months                   Told patient to return for periodic foot care and evaluation due to potential at risk complications.   Gardiner Barefoot DPM

## 2021-03-08 ENCOUNTER — Other Ambulatory Visit: Payer: Self-pay | Admitting: Family

## 2021-03-08 DIAGNOSIS — E119 Type 2 diabetes mellitus without complications: Secondary | ICD-10-CM

## 2021-03-16 ENCOUNTER — Other Ambulatory Visit: Payer: Self-pay | Admitting: Family

## 2021-04-10 ENCOUNTER — Other Ambulatory Visit: Payer: Self-pay | Admitting: Family

## 2021-04-18 NOTE — Progress Notes (Deleted)
HPI: FU CP. Carotid Dopplers December 2016 showed 1 to 39% bilateral stenosis.  Echocardiogram May 2022 showed normal LV function, grade 1 diastolic dysfunction, mild mitral regurgitation.  Since last seen  Current Outpatient Medications  Medication Sig Dispense Refill   Accu-Chek FastClix Lancets MISC USE AS DIRECTED TO CHECK  BLOOD SUGAR 102 each 3   acetaminophen (TYLENOL) 500 MG tablet Take 2 tablets (1,000 mg total) by mouth 2 (two) times daily as needed for moderate pain. 30 tablet 0   albuterol (VENTOLIN HFA) 108 (90 Base) MCG/ACT inhaler USE 2 INHALATIONS BY MOUTH  EVERY 6 HOURS AS NEEDED FOR WHEEZING OR SHORTNESS OF  BREATH 34 g 1   aspirin EC 81 MG tablet Take 81 mg by mouth daily.     atorvastatin (LIPITOR) 10 MG tablet TAKE 1 TABLET BY MOUTH  DAILY 90 tablet 3   betamethasone dipropionate 0.05 % cream Apply topically 2 (two) times daily. 30 g 0   Cholecalciferol (VITAMIN D3) 75 MCG (3000 UT) TABS Take 1 tablet by mouth daily. 90 tablet 3   COVID-19 mRNA vaccine, Moderna, 100 MCG/0.5ML injection INJECT AS DIRECTED .25 mL 0   fluticasone (FLONASE) 50 MCG/ACT nasal spray USE 2 SPRAYS IN BOTH  NOSTRILS DAILY 48 g 3   glucose blood (ACCU-CHEK GUIDE) test strip USE ONE TEST STRIP AS  INSTRUCTED 100 strip 3   influenza vaccine adjuvanted (FLUAD) 0.5 ML injection INJECT AS DIRECTED .5 mL 0   lisinopril (ZESTRIL) 5 MG tablet TAKE 1 TABLET BY MOUTH  DAILY 90 tablet 3   metFORMIN (GLUCOPHAGE) 500 MG tablet TAKE 1 TABLET BY MOUTH  TWICE DAILY WITH A MEAL 180 tablet 0   mometasone-formoterol (DULERA) 200-5 MCG/ACT AERO USE 1 INHALATION BY MOUTH  EVERY 12 HOURS 26 g 1   Multiple Vitamins-Minerals (CENTRUM SILVER 50+WOMEN) TABS Take 1 tablet by mouth daily.     nitroGLYCERIN (NITROSTAT) 0.4 MG SL tablet DISSOLVE 1 TABLET UNDER THE TONGUE EVERY 5 MINUTES AS  NEEDED FOR CHEST PAIN. MAX  OF 3 TABLETS IN 15 MINUTES. CALL 911 IF PAIN PERSISTS. 25 tablet 1   omeprazole (PRILOSEC) 20 MG capsule  TAKE 1 CAPSULE BY MOUTH  DAILY 90 capsule 0   Zoster Vaccine Adjuvanted [facility]) injection Inject 0.5mg  IM now and again in 2-6 months. 0.5 mL 1   No current facility-administered medications for this visit.     Past Medical History:  Diagnosis Date   Arthritis    CHF (congestive heart failure) ([facility])    Chronic asthma    Chronic headaches    Constipation    Diabetes mellitus without complication ([place])    Hemorrhoids without complication    Hypertension     Past Surgical History:  Procedure Laterality Date   ABDOMINAL HYSTERECTOMY     BACK SURGERY     CHOLECYSTECTOMY     COLONOSCOPY  09/04/2001   Two rectal polyps. Small internal hemorrhoids   ESOPHAGOGASTRODUODENOSCOPY  09/04/2001   Two apparent antral polyps with one possible gastroesophageal juction polyp versus enlarged fold or focal gastritis   KNEE SURGERY      Social History   Socioeconomic History   Marital status: Divorced    Spouse name: Not on file   Number of children: Not on file   Years of education: Not on file   Highest education level: Not on file  Occupational History   Not on file  Tobacco Use   Smoking status: Never   Smokeless  tobacco: Never  Vaping Use   Vaping Use: Never used  Substance and Sexual Activity   Alcohol use: No   Drug use: No   Sexual activity: Not Currently  Other Topics Concern   Not on file  Social History Narrative   Lives at home by herself, aide in the am.     Has 4 daughters and 3 living sons (one son died).    Uses walker.     Divorced at age 71   Social Determinants of Health   Financial Resource Strain: Not on file  Food Insecurity: Not on file  Transportation Needs: Not on file  Physical Activity: Not on file  Stress: Not on file  Social Connections: Not on file  Intimate Partner Violence: Not on file    Family History  Problem Relation Age of Onset   Diabetes Son    Hypertension Son    Cancer Sister        breast   Diabetes Daughter         plus 2 grandkids   Stroke Son        73mo age dx, has enlarged heart   Heart disease Son        passed away    ROS: no fevers or chills, productive cough, hemoptysis, dysphasia, odynophagia, melena, hematochezia, dysuria, hematuria, rash, seizure activity, orthopnea, PND, pedal edema, claudication. Remaining systems are negative.  Physical Exam: Well-developed well-nourished in no acute distress.  Skin is warm and dry.  HEENT is normal.  Neck is supple.  Chest is clear to auscultation with normal expansion.  Cardiovascular exam is regular rate and rhythm.  Abdominal exam nontender or distended. No masses palpated. Extremities show no edema. neuro grossly intact  ECG- personally reviewed  A/P  1 chest pain-symptoms are longstanding and somewhat atypical.  At last office visit we discussed a functional study but she preferred conservative measures.  She continues to feel this way.  Note LV function is normal.  2 hypertension-patient's blood pressure is controlled.  We will continue present medications and follow.  3 hyperlipidemia-continue statin.  4 history of trifascicular block-no history of syncope.  We will continue to follow.  Kirk Ruths, MD

## 2021-05-01 ENCOUNTER — Ambulatory Visit: Payer: Medicare Other | Admitting: Cardiology

## 2021-05-08 ENCOUNTER — Ambulatory Visit (INDEPENDENT_AMBULATORY_CARE_PROVIDER_SITE_OTHER): Payer: Medicare Other | Admitting: Podiatry

## 2021-05-08 ENCOUNTER — Other Ambulatory Visit: Payer: Self-pay

## 2021-05-08 DIAGNOSIS — M79674 Pain in right toe(s): Secondary | ICD-10-CM | POA: Diagnosis not present

## 2021-05-08 DIAGNOSIS — B351 Tinea unguium: Secondary | ICD-10-CM | POA: Diagnosis not present

## 2021-05-08 DIAGNOSIS — M79675 Pain in left toe(s): Secondary | ICD-10-CM | POA: Diagnosis not present

## 2021-05-08 DIAGNOSIS — E119 Type 2 diabetes mellitus without complications: Secondary | ICD-10-CM

## 2021-05-08 NOTE — Progress Notes (Signed)
This patient returns to my office for at risk foot care.  This patient requires this care by a professional since this patient will be at risk due to having  Type 2 diabetes.  This patient is unable to cut nails herself since the patient cannot reach her nails.These nails are painful walking and wearing shoes.   Patient presents to the office with her daughter. This patient presents for at risk foot care today.  General Appearance  Alert, conversant and in no acute stress.  Vascular  Dorsalis pedis  are weakly  palpable  bilaterally.  Posterior tibial pulses are not palpable  B/L. Absent digital hair. Capillary return is within normal limits  bilaterally. Temperature is within normal limits  bilaterally.  Neurologic  Senn-Weinstein monofilament wire test diminished  bilaterally. Muscle power within normal limits bilaterally.  Nails Thick disfigured discolored nails with subungual debris  from hallux to fifth toes bilaterally. No evidence of bacterial infection or drainage bilaterally.  Orthopedic  No limitations of motion  feet .  No crepitus or effusions noted.  No bony pathology or digital deformities noted.  Skin  normotropic skin with no porokeratosis noted bilaterally.  No signs of infections or ulcers noted.     Onychomycosis  Pain in right toes  Pain in left toes  Consent was obtained for treatment procedures.   Mechanical debridement of nails 1-5  bilaterally performed with a nail nipper.  Filed with dremel without incident.    Return office visit   3 months                   Told patient to return for periodic foot care and evaluation due to potential at risk complications.   Roberto Hlavaty DPM   

## 2021-05-31 ENCOUNTER — Other Ambulatory Visit: Payer: Self-pay | Admitting: Family

## 2021-06-01 NOTE — Telephone Encounter (Signed)
Pt overdue for appointment. I sent a 30 day supply of requested medications to her pharmacy. Please contact pt to arrange follow up.

## 2021-06-08 NOTE — Telephone Encounter (Signed)
Patient was scheduled to come in 06-29-21

## 2021-06-29 ENCOUNTER — Telehealth: Payer: Self-pay

## 2021-06-29 ENCOUNTER — Ambulatory Visit (INDEPENDENT_AMBULATORY_CARE_PROVIDER_SITE_OTHER): Payer: Medicare Other | Admitting: Family

## 2021-06-29 ENCOUNTER — Telehealth: Payer: Self-pay | Admitting: Family

## 2021-06-29 VITALS — BP 141/57 | HR 73 | Temp 98.1°F | Resp 16 | Wt 135.0 lb

## 2021-06-29 DIAGNOSIS — I509 Heart failure, unspecified: Secondary | ICD-10-CM | POA: Diagnosis not present

## 2021-06-29 DIAGNOSIS — E785 Hyperlipidemia, unspecified: Secondary | ICD-10-CM

## 2021-06-29 DIAGNOSIS — R634 Abnormal weight loss: Secondary | ICD-10-CM

## 2021-06-29 DIAGNOSIS — I10 Essential (primary) hypertension: Secondary | ICD-10-CM | POA: Diagnosis not present

## 2021-06-29 DIAGNOSIS — E119 Type 2 diabetes mellitus without complications: Secondary | ICD-10-CM | POA: Diagnosis not present

## 2021-06-29 DIAGNOSIS — Z23 Encounter for immunization: Secondary | ICD-10-CM | POA: Diagnosis not present

## 2021-06-29 MED ORDER — LISINOPRIL 10 MG PO TABS: 10.0000 mg | ORAL_TABLET | Freq: Every day | ORAL | 1 refills | Status: DC

## 2021-06-29 NOTE — Telephone Encounter (Signed)
Also, can you pull up the [place] immunzation registry and print a copy please? tks ?

## 2021-06-29 NOTE — Assessment & Plan Note (Signed)
Lab Results  ?Component Value Date  ? CHOL 130 04/28/2020  ? HDL 68.90 04/28/2020  ? [place] 39 04/28/2020  ? TRIG 108.0 04/28/2020  ? CHOLHDL 2 04/28/2020  ? ? ?

## 2021-06-29 NOTE — Telephone Encounter (Signed)
Immunization record printed, pharmacy not answering the phone. ?

## 2021-06-29 NOTE — Patient Instructions (Addendum)
Please increase lisinopril from '5mg'$  to '10mg'$ .  ?Please schedule diabetic eye exam.  ?Complete lab work prior to leaving. ?

## 2021-06-29 NOTE — Assessment & Plan Note (Signed)
BP Readings from Last 3 Encounters:  ?06/29/21 (!) 141/57  ?09/08/20 (!) 153/42  ?07/13/20 118/67  ? ?Uncontrolled. Increase lisinopril from '5mg'$  to '10mg'$ .  ?

## 2021-06-29 NOTE — Telephone Encounter (Signed)
Pt was screened for PAD at home by [facility] nurse.  ? ?Findings are:  ?L 0.81 ?R 0.74 ? ?Normal 0.9 and above.  ? ?Please advise.  ?

## 2021-06-29 NOTE — Progress Notes (Signed)
? ?Subjective:  ? ?By signing my name below, I, [name], attest that this documentation has been prepared under the direction and in the presence of [name], 06/29/2021 ? ? Patient ID: Angelica Benson, female    DOB: [birth year], 88 y.o.   MRN: [id number] ? ?Chief Complaint  ?Patient presents with  ? Diabetes  ?  Here for follow up-  ? Hypertension  ?  Here for follow up  ? ? ?HPI ?Patient is in today for an office. ? ?Blood Pressure - As of today's visit her blood pressure is decent. On 06/28/2021 her blood pressure reading was 162/60. She is currently taking 5 MG of Lisinopril.  ?BP Readings from Last 3 Encounters:  ?06/29/21 (!) 141/57  ?09/08/20 (!) 153/42  ?07/13/20 118/67  ? ?Hemoglobin A1C- Her last A1C levels were good. She continues to take 500 MG of Metformin. ?Lab Results  ?Component Value Date  ? HGBA1C 6.2 04/28/2020  ? ?Weight - Her weight has been decreasing. As of lately, she hasn't been eating as many meals as she used to. She does not feel hungry often. ?Wt Readings from Last 3 Encounters:  ?06/29/21 135 lb (61.2 kg)  ?09/08/20 153 lb (69.4 kg)  ?07/13/20 153 lb 9.6 oz (69.7 kg)  ? ?Social History - She is continuing to be social. She communicates with the people in her building and with her church.  ? ?Vaccinations - She is interested in receiving the pneumonia vaccine.  ? ?Vision - She is not UTD on her vision exams. The last vision exam she had was in 2021. ? ? ?Health Maintenance Due  ?Topic Date Due  ? TETANUS/TDAP  Never done  ? Zoster Vaccines- Shingrix (1 of 2) Never done  ? OPHTHALMOLOGY EXAM  11/30/2019  ? HEMOGLOBIN A1C  10/26/2020  ? INFLUENZA VACCINE  11/13/2020  ? ? ?Past Medical History:  ?Diagnosis Date  ? Arthritis   ? CHF (congestive heart failure) ([place])   ? Chronic asthma   ? Chronic headaches   ? Constipation   ? Diabetes mellitus without complication ([place])   ? Hemorrhoids without complication   ? Hypertension   ? ? ?Past Surgical History:  ?Procedure  Laterality Date  ? ABDOMINAL HYSTERECTOMY    ? BACK SURGERY    ? CHOLECYSTECTOMY    ? COLONOSCOPY  09/04/2001  ? Two rectal polyps. Small internal hemorrhoids  ? ESOPHAGOGASTRODUODENOSCOPY  09/04/2001  ? Two apparent antral polyps with one possible gastroesophageal juction polyp versus enlarged fold or focal gastritis  ? KNEE SURGERY    ? ? ?Family History  ?Problem Relation Age of Onset  ? Diabetes Son   ? Hypertension Son   ? Cancer Sister   ?     breast  ? Diabetes Daughter   ?     plus 2 grandkids  ? Stroke Son   ?     30moage dx, has enlarged heart  ? Heart disease Son   ?     passed away  ? ? ?Social History  ? ?Socioeconomic History  ? Marital status: Divorced  ?  Spouse name: Not on file  ? Number of children: Not on file  ? Years of education: Not on file  ? Highest education level: Not on file  ?Occupational History  ? Not on file  ?Tobacco Use  ? Smoking status: Never  ? Smokeless tobacco: Never  ?Vaping Use  ? Vaping Use: Never used  ?Substance and Sexual Activity  ?  Alcohol use: No  ? Drug use: No  ? Sexual activity: Not Currently  ?Other Topics Concern  ? Not on file  ?Social History Narrative  ? 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    ? Has 4 daughters and 3 living sons (one son died).   ? Uses walker.    ? Divorced at age 52  ? ?Social Determinants of Health  ? ?Financial Resource Strain: Not on file  ?Food Insecurity: Not on file  ?Transportation Needs: Not on file  ?Physical Activity: Not on file  ?Stress: Not on file  ?Social Connections: Not on file  ?Intimate Partner Violence: Not on file  ? ? ?Outpatient Medications Prior to Visit  ?Medication Sig Dispense Refill  ? Accu-Chek FastClix Lancets MISC USE AS DIRECTED TO CHECK  BLOOD SUGAR 102 each 3  ? acetaminophen (TYLENOL) 500 MG tablet Take 2 tablets (1,000 mg total) by mouth 2 (two) times daily as needed for moderate pain. 30 tablet 0  ? albuterol (VENTOLIN HFA) 108 (90 Base) MCG/ACT inhaler USE 2 INHALATIONS BY MOUTH  EVERY 6 HOURS AS  NEEDED FOR WHEEZING OR SHORTNESS OF  BREATH 8 g 0  ? aspirin EC 81 MG tablet Take 81 mg by mouth daily.    ? atorvastatin (LIPITOR) 10 MG tablet TAKE 1 TABLET BY MOUTH  DAILY 90 tablet 3  ? betamethasone dipropionate 0.05 % cream Apply topically 2 (two) times daily. 30 g 0  ? Cholecalciferol (VITAMIN D3) 75 MCG (3000 UT) TABS Take 1 tablet by mouth daily. 90 tablet 3  ? fluticasone (FLONASE) 50 MCG/ACT nasal spray USE 2 SPRAYS IN BOTH  NOSTRILS DAILY 48 g 3  ? glucose blood (ACCU-CHEK GUIDE) test strip USE ONE TEST STRIP AS  INSTRUCTED 100 strip 3  ? metFORMIN (GLUCOPHAGE) 500 MG tablet TAKE 1 TABLET BY MOUTH  TWICE DAILY WITH A MEAL 180 tablet 0  ? mometasone-formoterol (DULERA) 200-5 MCG/ACT AERO USE 1 INHALATION BY MOUTH  EVERY 12 HOURS 26 g 1  ? Multiple Vitamins-Minerals (CENTRUM SILVER 50+WOMEN) TABS Take 1 tablet by mouth daily.    ? nitroGLYCERIN (NITROSTAT) 0.4 MG SL tablet DISSOLVE 1 TABLET UNDER THE TONGUE EVERY 5 MINUTES AS  NEEDED FOR CHEST PAIN. MAX  OF 3 TABLETS IN 15 MINUTES. CALL 911 IF PAIN PERSISTS. 25 tablet 1  ? omeprazole (PRILOSEC) 20 MG capsule TAKE 1 CAPSULE BY MOUTH DAILY 30 capsule 0  ? Zoster Vaccine Adjuvanted [facility]) injection Inject 0.47m IM now and again in 2-6 months. 0.5 mL 1  ? lisinopril (ZESTRIL) 5 MG tablet TAKE 1 TABLET BY MOUTH  DAILY 90 tablet 3  ? ?No facility-administered medications prior to visit.  ? ? ?Allergies  ?Allergen Reactions  ? Buprenorphine Hcl Rash  ? Morphine And Related Rash  ? ? ?Review of Systems  ?Respiratory:  Negative for shortness of breath.   ? ?   ?Objective:  ?  ?Physical Exam ?Constitutional:   ?   General: She is not in acute distress. ?   Appearance: Normal appearance. She is not ill-appearing.  ?HENT:  ?   Head: Normocephalic and atraumatic.  ?   Right Ear: External ear normal.  ?   Left Ear: External ear normal.  ?Eyes:  ?   Extraocular Movements: Extraocular movements intact.  ?   Pupils: Pupils are equal, round, and reactive to light.   ?Cardiovascular:  ?   Rate and Rhythm: Normal rate and regular rhythm.  ?  Heart sounds: Normal heart sounds. No murmur heard. ?  No gallop.  ?Pulmonary:  ?   Effort: Pulmonary effort is normal. No respiratory distress.  ?   Breath sounds: Normal breath sounds. No wheezing or rales.  ?Skin: ?   General: Skin is warm and dry.  ?Neurological:  ?   Mental Status: She is alert and oriented to person, place, and time.  ?Psychiatric:     ?   Mood and Affect: Mood normal.     ?   Behavior: Behavior normal.     ?   Judgment: Judgment normal.  ? ? ?BP (!) 141/57 (BP Location: Right Arm, Patient Position: Sitting, Cuff Size: Small)   Pulse 73   Temp 98.1 ?F (36.7 ?C) (Oral)   Resp 16   Wt 135 lb (61.2 kg)   SpO2 100%   BMI 25.51 kg/m?  ?Wt Readings from Last 3 Encounters:  ?06/29/21 135 lb (61.2 kg)  ?09/08/20 153 lb (69.4 kg)  ?07/13/20 153 lb 9.6 oz (69.7 kg)  ? ? ?   ?Assessment & Plan:  ? ?Problem List Items Addressed This Visit   ? ?  ? Unprioritized  ? Hypertension  ?  BP Readings from Last 3 Encounters:  ?06/29/21 (!) 141/57  ?09/08/20 (!) 153/42  ?07/13/20 118/67  ?Uncontrolled. Increase lisinopril from 5mg to 10mg.  ?  ?  ? Relevant Medications  ? lisinopril (ZESTRIL) 10 MG tablet  ? Hyperlipidemia  ?  Lab Results  ?Component Value Date  ? CHOL 130 04/28/2020  ? HDL 68.90 04/28/2020  ? LDLCALC 39 04/28/2020  ? TRIG 108.0 04/28/2020  ? CHOLHDL 2 04/28/2020  ? ?  ?  ? Relevant Medications  ? lisinopril (ZESTRIL) 10 MG tablet  ? Other Relevant Orders  ? Lipid panel  ? Comp Met (CMET)  ? Diabetes mellitus without complication (HCC)  ?  Lab Results  ?Component Value Date  ? HGBA1C 6.2 04/28/2020  ? HGBA1C 6.0 10/26/2019  ? HGBA1C 6.0 03/29/2019  ? ?Lab Results  ?Component Value Date  ? MICROALBUR <0.7 04/04/2017  ? LDLCALC 39 04/28/2020  ? CREATININE 0.97 06/12/2020  ?Was stable 1 year ago. Will obtain follow up A1C.  ?  ?  ? Relevant Medications  ? lisinopril (ZESTRIL) 10 MG tablet  ? Other Relevant Orders  ?  Hemoglobin A1c  ? CHF (congestive heart failure) (HCC)  ?  Wt Readings from Last 3 Encounters:  ?06/29/21 135 lb (61.2 kg)  ?09/08/20 153 lb (69.4 kg)  ?07/13/20 153 lb 9.6 oz (69.7 kg)  ?Clinically euvolemic.  ?  ?  ? Rele

## 2021-06-29 NOTE — Telephone Encounter (Signed)
Noted.  Pt would be a poor surgical candidate and is asymptomatic. Will monitor clinically.  ?

## 2021-06-29 NOTE — Assessment & Plan Note (Addendum)
Lab Results  ?Component Value Date  ? HGBA1C 6.2 04/28/2020  ? HGBA1C 6.0 10/26/2019  ? HGBA1C 6.0 03/29/2019  ? ?Lab Results  ?Component Value Date  ? MICROALBUR <0.7 04/04/2017  ? [place] 39 04/28/2020  ? CREATININE 0.97 06/12/2020  ? ?Was stable 1 year ago. Will obtain follow up A1C.  ?

## 2021-06-29 NOTE — Telephone Encounter (Signed)
Please call [facility] at [place] and request immunization records for shingrix and any other shots they have given her.  ?

## 2021-06-29 NOTE — Assessment & Plan Note (Addendum)
Wt Readings from Last 3 Encounters:  ?06/29/21 135 lb (61.2 kg)  ?09/08/20 153 lb (69.4 kg)  ?07/13/20 153 lb 9.6 oz (69.7 kg)  ? ?Clinically euvolemic.  ?

## 2021-06-30 LAB — LIPID PANEL
Cholesterol: 141 mg/dL (ref <200)
HDL: 80 mg/dL (ref >=50)
LDL Cholesterol (Calc): 44 mg/dL (calc)
Non-HDL Cholesterol (Calc): 61 mg/dL (calc) (ref <130)
Total CHOL/HDL Ratio: 1.8 (calc) (ref <5.0)
Triglycerides: 85 mg/dL (ref <150)

## 2021-06-30 LAB — HEMOGLOBIN A1C
Hgb A1c MFr Bld: 5.6 % of total Hgb (ref <5.7)
Mean Plasma Glucose: 114 mg/dL
eAG (mmol/L): 6.3 mmol/L

## 2021-06-30 LAB — COMPREHENSIVE METABOLIC PANEL
AG Ratio: 1.5 (calc) (ref 1.0–2.5)
ALT: 12 U/L (ref 6–29)
AST: 20 U/L (ref 10–35)
Albumin: 4 g/dL (ref 3.6–5.1)
Alkaline phosphatase (APISO): 55 U/L (ref 37–153)
BUN: 20 mg/dL (ref 7–25)
CO2: 27 mmol/L (ref 20–32)
Calcium: 10 mg/dL (ref 8.6–10.4)
Chloride: 103 mmol/L (ref 98–110)
Creat: 0.92 mg/dL (ref 0.60–0.95)
Globulin: 2.7 g/dL (calc) (ref 1.9–3.7)
Glucose, Bld: 99 mg/dL (ref 65–99)
Potassium: 4.9 mmol/L (ref 3.5–5.3)
Sodium: 142 mmol/L (ref 135–146)
Total Bilirubin: 0.4 mg/dL (ref 0.2–1.2)
Total Protein: 6.7 g/dL (ref 6.1–8.1)

## 2021-06-30 LAB — TSH: TSH: 1.17 mIU/L (ref 0.40–4.50)

## 2021-06-30 LAB — EXTRA LAV TOP TUBE

## 2021-07-02 NOTE — Telephone Encounter (Signed)
Immunizations updated  ?Shingles 09-09-15/ 10-17/2017/ 09-08-20 ?

## 2021-07-05 ENCOUNTER — Other Ambulatory Visit: Payer: Self-pay | Admitting: Family

## 2021-07-10 ENCOUNTER — Encounter: Payer: Self-pay | Admitting: Family

## 2021-07-14 ENCOUNTER — Other Ambulatory Visit: Payer: Self-pay | Admitting: Family

## 2021-07-17 ENCOUNTER — Telehealth: Payer: Self-pay | Admitting: Family

## 2021-07-17 NOTE — Telephone Encounter (Signed)
Orders received and put in provider's folder. Provider is currently out of the office until 07-24-21 ?

## 2021-07-17 NOTE — Telephone Encounter (Signed)
[place] [facility] would like to confirm a fax was received for renewal for incontinence. Please advise.  ?

## 2021-07-24 ENCOUNTER — Ambulatory Visit (INDEPENDENT_AMBULATORY_CARE_PROVIDER_SITE_OTHER): Payer: Medicare Other | Admitting: Family

## 2021-07-24 VITALS — BP 134/54 | HR 78 | Temp 98.4°F | Resp 16 | Wt 130.0 lb

## 2021-07-24 DIAGNOSIS — M8580 Other specified disorders of bone density and structure, unspecified site: Secondary | ICD-10-CM | POA: Diagnosis not present

## 2021-07-24 DIAGNOSIS — R634 Abnormal weight loss: Secondary | ICD-10-CM

## 2021-07-24 DIAGNOSIS — I10 Essential (primary) hypertension: Secondary | ICD-10-CM

## 2021-07-24 DIAGNOSIS — F32A Depression, unspecified: Secondary | ICD-10-CM

## 2021-07-24 MED ORDER — ESCITALOPRAM OXALATE 5 MG PO TABS: 5.0000 mg | ORAL_TABLET | Freq: Every day | ORAL | 1 refills | Status: DC

## 2021-07-24 MED ORDER — NITROGLYCERIN 0.4 MG SL SUBL: SUBLINGUAL_TABLET | SUBLINGUAL | 1 refills | Status: DC

## 2021-07-24 MED ORDER — ACCU-CHEK FASTCLIX LANCETS MISC: 3 refills | Status: DC

## 2021-07-24 NOTE — Assessment & Plan Note (Signed)
Wt Readings from Last 3 Encounters:  ?07/24/21 130 lb (59 kg)  ?06/29/21 135 lb (61.2 kg)  ?09/08/20 153 lb (69.4 kg)  ? ?BP Readings from Last 3 Encounters:  ?07/24/21 (!) 134/54  ?06/29/21 (!) 141/57  ?09/08/20 (!) 153/42  ? ? ?

## 2021-07-24 NOTE — Assessment & Plan Note (Addendum)
Lab Results  ?Component Value Date  ? TSH 1.17 06/29/2021  ? ?Wt Readings from Last 3 Encounters:  ?07/24/21 130 lb (59 kg)  ?06/29/21 135 lb (61.2 kg)  ?09/08/20 153 lb (69.4 kg)  ? ?Ongoing weight loss. Normal TSH.  Concern for underlying malignancy. Will order CT Chest/abd/pelvis for further evaluation.  ? ?

## 2021-07-24 NOTE — Progress Notes (Signed)
? ?Subjective:  ? ?By signing my name below, I, [name], attest that this documentation has been prepared under the direction and in the presence of [name], 07/24/2021  ? ? Patient ID: Angelica Benson, female    DOB: 1928/12/17, 88 y.o.   MRN: [id number] ? ?Chief Complaint  ?Patient presents with  ? Hypertension  ?  Here for follow up  ? Weight Loss  ?  Follow up  ? ? ?HPI ?Patient is in today for an office visit. She is with her daughter. ? ?Weight - She has been losing weight. Her appetite has not been well as of recently. Her daughter leaves her leftovers to consume. Her daughter is interested in utilizing Meals on Wheels for the patient. Her thyroid levels are well.  ?Wt Readings from Last 3 Encounters:  ?07/24/21 130 lb (59 kg)  ?06/29/21 135 lb (61.2 kg)  ?09/08/20 153 lb (69.4 kg)  ? ?A1C - As of today's visit, her A1C levels are good. ?Lab Results  ?Component Value Date  ? HGBA1C 5.6 06/29/2021  ? ?Lisinopril - She has been taking 10 MG of Lisinopril. She states that she feels mild fatigue from the medication. ?BP Readings from Last 3 Encounters:  ?07/24/21 (!) 134/54  ?06/29/21 (!) 141/57  ?09/08/20 (!) 153/42  ? ?Loneliness/Isolation - She expresses feelings of loneliness. Her aiding assistance would come see her but due to complications in the company, she has not seen her aiding assistance recently. She is interested in using medications for depression. ? ?Health Maintenance Due  ?Topic Date Due  ? TETANUS/TDAP  Never done  ? OPHTHALMOLOGY EXAM  11/30/2019  ? ? ?Past Medical History:  ?Diagnosis Date  ? Arthritis   ? CHF (congestive heart failure) ([place])   ? Chronic asthma   ? Chronic headaches   ? Constipation   ? Diabetes mellitus without complication ([place])   ? Hemorrhoids without complication   ? Hypertension   ? ? ?Past Surgical History:  ?Procedure Laterality Date  ? ABDOMINAL HYSTERECTOMY    ? BACK SURGERY    ? CHOLECYSTECTOMY    ? COLONOSCOPY  09/04/2001  ? Two rectal polyps.  Small internal hemorrhoids  ? ESOPHAGOGASTRODUODENOSCOPY  09/04/2001  ? Two apparent antral polyps with one possible gastroesophageal juction polyp versus enlarged fold or focal gastritis  ? KNEE SURGERY    ? ? ?Family History  ?Problem Relation Age of Onset  ? Diabetes Son   ? Hypertension Son   ? Cancer Sister   ?     breast  ? Diabetes Daughter   ?     plus 2 grandkids  ? Stroke Son   ?     46moage dx, has enlarged heart  ? Heart disease Son   ?     passed away  ? ? ?Social History  ? ?Socioeconomic History  ? Marital status: Divorced  ?  Spouse name: Not on file  ? Number of children: Not on file  ? Years of education: Not on file  ? Highest education level: Not on file  ?Occupational History  ? Not on file  ?Tobacco Use  ? Smoking status: Never  ? Smokeless tobacco: Never  ?Vaping Use  ? Vaping Use: Never used  ?Substance and Sexual Activity  ? Alcohol use: No  ? Drug use: No  ? Sexual activity: Not Currently  ?Other Topics Concern  ? Not on file  ?Social History Narrative  ? Lives at home by  herself, aide in the am.    ? Has 4 daughters and 3 living sons (one son died).   ? Uses walker.    ? Divorced at age 38  ? ?Social Determinants of Health  ? ?Financial Resource Strain: Not on file  ?Food Insecurity: Not on file  ?Transportation Needs: Not on file  ?Physical Activity: Not on file  ?Stress: Not on file  ?Social Connections: Not on file  ?Intimate Partner Violence: Not on file  ? ? ?Outpatient Medications Prior to Visit  ?Medication Sig Dispense Refill  ? acetaminophen (TYLENOL) 500 MG tablet Take 2 tablets (1,000 mg total) by mouth 2 (two) times daily as needed for moderate pain. 30 tablet 0  ? albuterol (VENTOLIN HFA) 108 (90 Base) MCG/ACT inhaler USE 2 INHALATIONS BY MOUTH  EVERY 6 HOURS AS NEEDED FOR WHEEZING OR SHORTNESS OF  BREATH 8 g 0  ? aspirin EC 81 MG tablet Take 81 mg by mouth daily.    ? atorvastatin (LIPITOR) 10 MG tablet TAKE 1 TABLET BY MOUTH  DAILY 90 tablet 3  ? betamethasone dipropionate  0.05 % cream Apply topically 2 (two) times daily. 30 g 0  ? Cholecalciferol (VITAMIN D3) 75 MCG (3000 UT) TABS Take 1 tablet by mouth daily. 90 tablet 3  ? fluticasone (FLONASE) 50 MCG/ACT nasal spray USE 2 SPRAYS IN BOTH  NOSTRILS DAILY 48 g 3  ? glucose blood (ACCU-CHEK GUIDE) test strip USE ONE TEST STRIP AS  INSTRUCTED 100 strip 3  ? lisinopril (ZESTRIL) 10 MG tablet Take 1 tablet (10 mg total) by mouth daily. 90 tablet 1  ? metFORMIN (GLUCOPHAGE) 500 MG tablet TAKE 1 TABLET BY MOUTH TWICE  DAILY WITH A MEAL 180 tablet 0  ? mometasone-formoterol (DULERA) 200-5 MCG/ACT AERO USE 1 INHALATION BY MOUTH  EVERY 12 HOURS 26 g 1  ? Multiple Vitamins-Minerals (CENTRUM SILVER 50+WOMEN) TABS Take 1 tablet by mouth daily.    ? omeprazole (PRILOSEC) 20 MG capsule TAKE 1 CAPSULE BY MOUTH DAILY 30 capsule 11  ? Zoster Vaccine Adjuvanted [facility]) injection Inject 0.'5mg'$  IM now and again in 2-6 months. 0.5 mL 1  ? Accu-Chek FastClix Lancets MISC USE AS DIRECTED TO CHECK  BLOOD SUGAR 102 each 3  ? nitroGLYCERIN (NITROSTAT) 0.4 MG SL tablet DISSOLVE 1 TABLET UNDER THE TONGUE EVERY 5 MINUTES AS  NEEDED FOR CHEST PAIN. MAX  OF 3 TABLETS IN 15 MINUTES. CALL 911 IF PAIN PERSISTS. 25 tablet 1  ? ?No facility-administered medications prior to visit.  ? ? ?Allergies  ?Allergen Reactions  ? Buprenorphine Hcl Rash  ? Morphine And Related Rash  ? ? ?Review of Systems  ?Constitutional:  Positive for weight loss.  ?Psychiatric/Behavioral:  Negative for depression.   ?     (+) Loneliness  ? ?   ?Objective:  ?  ?Physical Exam ?Constitutional:   ?   General: She is not in acute distress. ?   Appearance: Normal appearance. She is not ill-appearing.  ?HENT:  ?   Head: Normocephalic and atraumatic.  ?   Right Ear: External ear normal.  ?   Left Ear: External ear normal.  ?Eyes:  ?   Extraocular Movements: Extraocular movements intact.  ?   Pupils: Pupils are equal, round, and reactive to light.  ?Cardiovascular:  ?   Rate and Rhythm: Normal rate  and regular rhythm.  ?   Heart sounds: Normal heart sounds. No murmur heard. ?  No gallop.  ?Pulmonary:  ?  Effort: Pulmonary effort is normal. No respiratory distress.  ?   Breath sounds: Normal breath sounds. No wheezing or rales.  ?Skin: ?   General: Skin is warm and dry.  ?Neurological:  ?   Mental Status: She is alert and oriented to person, place, and time.  ?Psychiatric:     ?   Mood and Affect: Mood normal.     ?   Behavior: Behavior normal.     ?   Judgment: Judgment normal.  ? ? ?BP (!) 134/54 (BP Location: Right Arm, Patient Position: Sitting, Cuff Size: Small)   Pulse 78   Temp 98.4 ?F (36.9 ?C) (Oral)   Resp 16   Wt 130 lb (59 kg)   SpO2 100%   BMI 24.56 kg/m?  ?Wt Readings from Last 3 Encounters:  ?07/24/21 130 lb (59 kg)  ?06/29/21 135 lb (61.2 kg)  ?09/08/20 153 lb (69.4 kg)  ? ? ?   ?Assessment & Plan:  ? ?Problem List Items Addressed This Visit   ? ?  ? Unprioritized  ? Weight loss  ?  Lab Results  ?Component Value Date  ? TSH 1.17 06/29/2021  ? ?Wt Readings from Last 3 Encounters:  ?07/24/21 130 lb (59 kg)  ?06/29/21 135 lb (61.2 kg)  ?09/08/20 153 lb (69.4 kg)  ?Ongoing weight loss. Normal TSH.  Concern for underlying malignancy. Will order CT Chest/abd/pelvis for further evaluation.  ? ?  ?  ? Relevant Orders  ? CT Abdomen Pelvis W Contrast  ? CT Chest W Contrast  ? AMB Referral to [place]  ? Osteopenia - Primary  ? Relevant Orders  ? Vitamin D (25 hydroxy)  ? Hypertension  ?  Wt Readings from Last 3 Encounters:  ?07/24/21 130 lb (59 kg)  ?06/29/21 135 lb (61.2 kg)  ?09/08/20 153 lb (69.4 kg)  ? ?BP Readings from Last 3 Encounters:  ?07/24/21 (!) 134/54  ?06/29/21 (!) 141/57  ?09/08/20 (!) 153/42  ? ?  ?  ? Relevant Medications  ? nitroGLYCERIN (NITROSTAT) 0.4 MG SL tablet  ? Other Relevant Orders  ? Basic metabolic panel  ? Depression  ?  I think that she may be experiencing some depression symptoms contributing to her anorexia. Will give a trial of lexapro '5mg'$ .  ?  ?   ? Relevant Medications  ? escitalopram (LEXAPRO) 5 MG tablet  ? Benign essential hypertension  ?  BP is improved. Continue lisinopril '10mg'$ .  ?  ?  ? Relevant Medications  ? nitroGLYCERIN (NITROSTAT) 0.4 MG S

## 2021-07-24 NOTE — Assessment & Plan Note (Signed)
BP is improved. Continue lisinopril '10mg'$ .  ?

## 2021-07-24 NOTE — Assessment & Plan Note (Signed)
I think that she may be experiencing some depression symptoms contributing to her anorexia. Will give a trial of lexapro '5mg'$ .  ?

## 2021-07-24 NOTE — Telephone Encounter (Signed)
[place] [facility] called for update. Vicenta Dunning.  ?

## 2021-07-24 NOTE — Patient Instructions (Signed)
Please begin lexapro '5mg'$  once daily to see if this helps with appetite. ?

## 2021-07-25 ENCOUNTER — Telehealth: Payer: Self-pay

## 2021-07-25 LAB — BASIC METABOLIC PANEL
BUN: 22 mg/dL (ref 6–23)
CO2: 27 mEq/L (ref 19–32)
Calcium: 10 mg/dL (ref 8.4–10.5)
Chloride: 102 mEq/L (ref 96–112)
Creatinine, Ser: 0.91 mg/dL (ref 0.40–1.20)
GFR: 55.39 mL/min — ABNORMAL LOW (ref >60.00)
Glucose, Bld: 74 mg/dL (ref 70–99)
Potassium: 5.1 mEq/L (ref 3.5–5.1)
Sodium: 138 mEq/L (ref 135–145)

## 2021-07-25 LAB — VITAMIN D 25 HYDROXY (VIT D DEFICIENCY, FRACTURES): VITD: >120 ng/mL

## 2021-07-25 NOTE — Telephone Encounter (Signed)
[name] advised of results and provider's advise to dc all vitamin D.  ?

## 2021-07-25 NOTE — Addendum Note (Signed)
Addended by: Debbrah Alar on: 07/25/2021 12:29 PM ? ? Modules accepted: Orders ? ?

## 2021-07-25 NOTE — Telephone Encounter (Signed)
CRITICAL VALUE STICKER ? ?CRITICAL VALUE: Vitamin D greater than 120 ? ?RECEIVER (on-site recipient of call): Kristine Garbe, [place] ? ?DATE & TIME NOTIFIED: 11:20AM 07/25/21 ? ?MESSENGER (representative from lab): Roger Kill ? ?MD NOTIFIED: yes ? ?TIME OF NOTIFICATION: 11:30AM 07/25/21 ? ?RESPONSE: pending ? ?

## 2021-07-25 NOTE — Telephone Encounter (Signed)
Vit D level is much too high. Please advise pt to stop vit D supplement and any vitamins containing vitamin D.  ?

## 2021-07-30 ENCOUNTER — Telehealth: Payer: Self-pay | Admitting: *Deleted

## 2021-07-30 ENCOUNTER — Telehealth: Payer: Self-pay | Admitting: Family

## 2021-07-30 NOTE — Telephone Encounter (Signed)
? ?  Telephone encounter was:  Unsuccessful.  07/30/2021 ?Name: Angelica Benson MRN: [id number] DOB: 11-07-1928 ? ?Unsuccessful outbound call made today to assist with:   Meals on wheels ? ?Outreach Attempt:  1st Attempt ? ?A HIPAA compliant voice message was left requesting a return call.  Instructed patient to call back at   Instructed patient to call back at [phone]  at their earliest convenience. . ? ?Suzane Vanderweide Greenauer -Selinda Eon ?Care Guide , Embedded Care Coordination ?[place], Care Management  ?[phone] ?[address] , [place] [address] ?Email : Ashby Dawes. Greenauer-moran [email or web address] ?  ? ?

## 2021-07-30 NOTE — Telephone Encounter (Signed)
Form signed and faxed back.

## 2021-07-30 NOTE — Telephone Encounter (Signed)
[place] [employer] [facility] is calling to make Melissa aware that one of the documents faxed to them is missing a signature.The form ME[facility] is missing a signature, and they will be faxing the form over to [place] again. They states that the forms expire by the 28th of this month, so they need it no later than the 27th. Please advise.  ?

## 2021-07-31 ENCOUNTER — Telehealth: Payer: Self-pay | Admitting: *Deleted

## 2021-07-31 NOTE — Telephone Encounter (Signed)
? ?  Telephone encounter was:  Successful.  ?07/31/2021 ?Name: CHEYANNE LAMISON MRN: [id number] DOB: 03-18-1929 ? ?CARMELLA KEES is a 88 y.o. year old female who is a primary care patient of Debbrah Alar, NP . The community resource team was consulted for assistance with Will put on waitlist for MOWS .through [place] explained to patient daughte that she there is a 4 to 6 month wait for meals ? ?Care guide performed the following interventions: Patient provided with information about care guide support team and interviewed to confirm resource needs. ? ?Follow Up Plan:  No further follow up planned at this time. The patient has been provided with needed resources. ? ?Lovett Sox -Selinda Eon ?Care Guide , Embedded Care Coordination ?[place], Care Management  ?[phone] ?[address] , [place] [address] ?Email : Ashby Dawes. Greenauer-moran [email or web address] ?  ? ?

## 2021-07-31 NOTE — Telephone Encounter (Signed)
? ?  Telephone encounter was:  Successful.  ?07/31/2021 ?Name: Angelica Benson MRN: [id number] DOB: Jan 03, 1929 ? ?Angelica Benson is a 88 y.o. year old female who is a primary care patient of Debbrah Alar, NP . The community resource team was consulted for assistance with Will put on waitlist for MOWS .through [facility] explained to patient daughte that she there is a 4 to 6 month wait for meals ? ?Care guide performed the following interventions: Patient provided with information about care guide support team and interviewed to confirm resource needs ?Follow up call placed to the patient to discuss status of referral. ? ?Follow Up Plan:  No further follow up planned at this time. The patient has been provided with needed resources. ? ?Lovett Sox -Selinda Eon ?Care Guide , Embedded Care Coordination ?[place], Care Management  ?[phone] ?[address] , [place] [address] ?Email : Ashby Dawes. Greenauer-moran [email or web address] ?  ?

## 2021-08-06 ENCOUNTER — Ambulatory Visit (INDEPENDENT_AMBULATORY_CARE_PROVIDER_SITE_OTHER): Payer: Medicare Other | Admitting: Podiatry

## 2021-08-06 ENCOUNTER — Encounter: Payer: Self-pay | Admitting: Podiatry

## 2021-08-06 DIAGNOSIS — M79674 Pain in right toe(s): Secondary | ICD-10-CM

## 2021-08-06 DIAGNOSIS — E119 Type 2 diabetes mellitus without complications: Secondary | ICD-10-CM

## 2021-08-06 DIAGNOSIS — B351 Tinea unguium: Secondary | ICD-10-CM | POA: Diagnosis not present

## 2021-08-06 DIAGNOSIS — M79675 Pain in left toe(s): Secondary | ICD-10-CM

## 2021-08-06 NOTE — Progress Notes (Signed)
This patient returns to my office for at risk foot care.  This patient requires this care by a professional since this patient will be at risk due to having  Type 2 diabetes.  This patient is unable to cut nails herself since the patient cannot reach her nails.These nails are painful walking and wearing shoes.   Patient presents to the office with her daughter. This patient presents for at risk foot care today.  General Appearance  Alert, conversant and in no acute stress.  Vascular  Dorsalis pedis  are weakly  palpable  bilaterally.  Posterior tibial pulses are not palpable  B/L. Absent digital hair. Capillary return is within normal limits  bilaterally. Temperature is within normal limits  bilaterally.  Neurologic  Senn-Weinstein monofilament wire test diminished  bilaterally. Muscle power within normal limits bilaterally.  Nails Thick disfigured discolored nails with subungual debris  from hallux to fifth toes bilaterally. No evidence of bacterial infection or drainage bilaterally.  Orthopedic  No limitations of motion  feet .  No crepitus or effusions noted.  No bony pathology or digital deformities noted.  Skin  normotropic skin with no porokeratosis noted bilaterally.  No signs of infections or ulcers noted.     Onychomycosis  Pain in right toes  Pain in left toes  Consent was obtained for treatment procedures.   Mechanical debridement of nails 1-5  bilaterally performed with a nail nipper.  Filed with dremel without incident.    Return office visit   3 months                   Told patient to return for periodic foot care and evaluation due to potential at risk complications.   Vernessa Likes DPM   

## 2021-08-14 ENCOUNTER — Ambulatory Visit (HOSPITAL_BASED_OUTPATIENT_CLINIC_OR_DEPARTMENT_OTHER): Payer: Medicare Other

## 2021-08-15 ENCOUNTER — Telehealth: Payer: Self-pay | Admitting: Family

## 2021-08-15 NOTE — Telephone Encounter (Signed)
[name] advised she needs to contact radiology for instruction per radiology protocol and oral contrast if needed per protocol. ?

## 2021-08-15 NOTE — Telephone Encounter (Signed)
[name] is calling in regards of her mom's scan tomorrow with contrast. She states she spoke to imaging and they asked her if her mom had the contrast, to which she was unsure of who was supposed to give it to her. She would like a call back to go over instructions for her moms scans tomorrow. ?Please advise.  ? ?[phone] ?

## 2021-08-16 ENCOUNTER — Ambulatory Visit (HOSPITAL_BASED_OUTPATIENT_CLINIC_OR_DEPARTMENT_OTHER): Payer: Medicare Other

## 2021-08-16 ENCOUNTER — Ambulatory Visit (HOSPITAL_BASED_OUTPATIENT_CLINIC_OR_DEPARTMENT_OTHER)
Admission: RE | Admit: 2021-08-16 | Discharge: 2021-08-16 | Disposition: A | Discharge status: Home / Self Care | Payer: Medicare Other | Source: Ambulatory Visit | Attending: Family | Admitting: Family

## 2021-08-16 ENCOUNTER — Encounter (HOSPITAL_BASED_OUTPATIENT_CLINIC_OR_DEPARTMENT_OTHER): Payer: Self-pay

## 2021-08-16 DIAGNOSIS — R918 Other nonspecific abnormal finding of lung field: Secondary | ICD-10-CM | POA: Diagnosis not present

## 2021-08-16 DIAGNOSIS — R634 Abnormal weight loss: Secondary | ICD-10-CM | POA: Insufficient documentation

## 2021-08-16 DIAGNOSIS — E041 Nontoxic single thyroid nodule: Secondary | ICD-10-CM | POA: Insufficient documentation

## 2021-08-16 DIAGNOSIS — R160 Hepatomegaly, not elsewhere classified: Secondary | ICD-10-CM | POA: Insufficient documentation

## 2021-08-16 DIAGNOSIS — I700 Atherosclerosis of aorta: Secondary | ICD-10-CM | POA: Insufficient documentation

## 2021-08-16 DIAGNOSIS — K6389 Other specified diseases of intestine: Secondary | ICD-10-CM | POA: Diagnosis not present

## 2021-08-16 MED ORDER — IOHEXOL 300 MG/ML  SOLN
100.0000 mL | Freq: Once | INTRAMUSCULAR | Status: AC | PRN
Start: 2021-08-16 — End: 2021-08-16
  Administered 2021-08-16: 100 mL via INTRAVENOUS

## 2021-08-17 ENCOUNTER — Telehealth: Payer: Self-pay | Admitting: Family

## 2021-08-17 DIAGNOSIS — K769 Liver disease, unspecified: Secondary | ICD-10-CM

## 2021-08-17 NOTE — Telephone Encounter (Signed)
Please contact pt's daughter and let her know that the spot previously seen in her liver has enlarged and the radiologist has recommended that she have a follow up MRI to further evaluate. It is important that she complete this. I have placed the order.  ?

## 2021-08-17 NOTE — Telephone Encounter (Signed)
Patient's care giver daughter, Vita Barley advised of results and additional study needed. She verbalized understanding.  Follow up appointment for next week cancelled until patient has MRI ?

## 2021-08-20 DIAGNOSIS — H2513 Age-related nuclear cataract, bilateral: Secondary | ICD-10-CM | POA: Diagnosis not present

## 2021-08-20 DIAGNOSIS — E119 Type 2 diabetes mellitus without complications: Secondary | ICD-10-CM | POA: Diagnosis not present

## 2021-08-20 DIAGNOSIS — H5203 Hypermetropia, bilateral: Secondary | ICD-10-CM | POA: Diagnosis not present

## 2021-08-20 DIAGNOSIS — H524 Presbyopia: Secondary | ICD-10-CM | POA: Diagnosis not present

## 2021-08-20 DIAGNOSIS — H52223 Regular astigmatism, bilateral: Secondary | ICD-10-CM | POA: Diagnosis not present

## 2021-08-21 ENCOUNTER — Ambulatory Visit: Payer: Medicare Other | Admitting: Family

## 2021-08-21 LAB — HM DIABETES EYE EXAM

## 2021-08-25 ENCOUNTER — Other Ambulatory Visit: Payer: Self-pay | Admitting: Family

## 2021-08-29 ENCOUNTER — Ambulatory Visit (HOSPITAL_BASED_OUTPATIENT_CLINIC_OR_DEPARTMENT_OTHER)
Admission: RE | Admit: 2021-08-29 | Discharge: 2021-08-29 | Disposition: A | Discharge status: Home / Self Care | Payer: Medicare Other | Source: Ambulatory Visit | Attending: Family | Admitting: Family

## 2021-08-29 DIAGNOSIS — Z9049 Acquired absence of other specified parts of digestive tract: Secondary | ICD-10-CM | POA: Diagnosis not present

## 2021-08-29 DIAGNOSIS — K769 Liver disease, unspecified: Secondary | ICD-10-CM | POA: Diagnosis not present

## 2021-08-29 DIAGNOSIS — N281 Cyst of kidney, acquired: Secondary | ICD-10-CM | POA: Diagnosis not present

## 2021-08-29 DIAGNOSIS — R160 Hepatomegaly, not elsewhere classified: Secondary | ICD-10-CM | POA: Diagnosis not present

## 2021-08-29 DIAGNOSIS — K7689 Other specified diseases of liver: Secondary | ICD-10-CM | POA: Diagnosis not present

## 2021-08-29 MED ORDER — GADOBUTROL 1 MMOL/ML IV SOLN
6.0000 mL | Freq: Once | INTRAVENOUS | Status: AC | PRN
  Administered 2021-08-29: 6 mL via INTRAVENOUS

## 2021-08-29 NOTE — Progress Notes (Signed)
HPI: FU CP. Carotid Dopplers December 2016 showed 1 to 39% bilateral stenosis.  Echocardiogram May 2022 showed normal LV function, grade 1 diastolic dysfunction, mild mitral regurgitation.  Abdominal CT May 2023 showed enlarging 7 cm mass in the left lobe of the liver.  MRI was recommended.  MRI showed enlarging mass in the left hepatic lobe suspicious for hepatocellular carcinoma.  Since last seen patient denies dyspnea, chest pain, palpitations or syncope.  Current Outpatient Medications  Medication Sig Dispense Refill   Accu-Chek FastClix Lancets MISC Use once a day 102 each 3   acetaminophen (TYLENOL) 500 MG tablet Take 2 tablets (1,000 mg total) by mouth 2 (two) times daily as needed for moderate pain. 30 tablet 0   albuterol (VENTOLIN HFA) 108 (90 Base) MCG/ACT inhaler USE 2 INHALATIONS BY MOUTH  EVERY 6 HOURS AS NEEDED FOR WHEEZING OR SHORTNESS OF  BREATH 8 g 0   aspirin EC 81 MG tablet Take 81 mg by mouth daily.     atorvastatin (LIPITOR) 10 MG tablet TAKE 1 TABLET BY MOUTH  DAILY 100 tablet 2   betamethasone dipropionate 0.05 % cream Apply topically 2 (two) times daily. 30 g 0   escitalopram (LEXAPRO) 5 MG tablet Take 1 tablet (5 mg total) by mouth daily. 30 tablet 1   fluticasone (FLONASE) 50 MCG/ACT nasal spray USE 2 SPRAYS IN BOTH  NOSTRILS DAILY 48 g 3   glucose blood (ACCU-CHEK GUIDE) test strip USE ONE TEST STRIP AS  INSTRUCTED 100 strip 3   lisinopril (ZESTRIL) 10 MG tablet TAKE 1 TABLET BY MOUTH DAILY 100 tablet 2   metFORMIN (GLUCOPHAGE) 500 MG tablet TAKE 1 TABLET BY MOUTH TWICE  DAILY WITH A MEAL 180 tablet 0   mometasone-formoterol (DULERA) 200-5 MCG/ACT AERO USE 1 INHALATION BY MOUTH  EVERY 12 HOURS 26 g 1   Multiple Vitamins-Minerals (CENTRUM SILVER 50+WOMEN) TABS Take 1 tablet by mouth daily.     nitroGLYCERIN (NITROSTAT) 0.4 MG SL tablet DISSOLVE 1 TABLET UNDER THE TONGUE EVERY 5 MINUTES AS  NEEDED FOR CHEST PAIN. MAX  OF 3 TABLETS IN 15 MINUTES. CALL 911 IF PAIN  PERSISTS. 25 tablet 1   omeprazole (PRILOSEC) 20 MG capsule TAKE 1 CAPSULE BY MOUTH DAILY 30 capsule 11   Zoster Vaccine Adjuvanted [facility]) injection Inject 0.'5mg'$  IM now and again in 2-6 months. (Patient not taking: Reported on 09/06/2021) 0.5 mL 1   No current facility-administered medications for this visit.     Past Medical History:  Diagnosis Date   Arthritis    CHF (congestive heart failure) (HCC)    Chronic asthma    Chronic headaches    Constipation    Diabetes mellitus without complication ([place])    Hemorrhoids without complication    Hypertension     Past Surgical History:  Procedure Laterality Date   ABDOMINAL HYSTERECTOMY     BACK SURGERY     CHOLECYSTECTOMY     COLONOSCOPY  09/04/2001   Two rectal polyps. Small internal hemorrhoids   ESOPHAGOGASTRODUODENOSCOPY  09/04/2001   Two apparent antral polyps with one possible gastroesophageal juction polyp versus enlarged fold or focal gastritis   KNEE SURGERY      Social History   Socioeconomic History   Marital status: Divorced    Spouse name: Not on file   Number of children: Not on file   Years of education: Not on file   Highest education level: Not on file  Occupational History   Not  on file  Tobacco Use   Smoking status: Never   Smokeless tobacco: Never  Vaping Use   Vaping Use: Never used  Substance and Sexual Activity   Alcohol use: No   Drug use: No   Sexual activity: Not Currently  Other Topics Concern   Not on file  Social History Narrative   Lives at home by herself, aide in the am.     Has 4 daughters and 3 living sons (one son died).    Uses walker.     Divorced at age 21   Social Determinants of Health   Financial Resource Strain: Not on file  Food Insecurity: Not on file  Transportation Needs: Not on file  Physical Activity: Not on file  Stress: Not on file  Social Connections: Not on file  Intimate Partner Violence: Not on file    Family History  Problem Relation Age of  Onset   Diabetes Son    Hypertension Son    Cancer Sister        breast   Diabetes Daughter        plus 2 grandkids   Stroke Son        74moage dx, has enlarged heart   Heart disease Son        passed away    ROS: Weight loss of 35 pounds over the past 1 year but no fevers or chills, productive cough, hemoptysis, dysphasia, odynophagia, melena, hematochezia, dysuria, hematuria, rash, seizure activity, orthopnea, PND, pedal edema, claudication. Remaining systems are negative.  Physical Exam: Well-developed thin in no acute distress.  Skin is warm and dry.  HEENT is normal.  Neck is supple.  Chest is clear to auscultation with normal expansion.  Cardiovascular exam is regular rate and rhythm.  Abdominal exam nontender or distended. No masses palpated. Extremities show no edema. neuro grossly intact  A/P  1 chest pain-previous symptoms felt to be atypical with no recurrences.  They were also longstanding and follow-up echocardiogram showed normal LV function.   2 hypertension-patient's blood pressure is controlled.  3 hyperlipidemia-continue statin.  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  No history of syncope.  BKirk Ruths MD

## 2021-08-31 ENCOUNTER — Telehealth: Payer: Self-pay | Admitting: Family

## 2021-08-31 NOTE — Telephone Encounter (Signed)
Reviewed MRI results with patient's daughter Vita Barley.  She would like to discuss goals of care some with the extended family and will plan to follow up on 5/26 with her mother and any other interested family members to further discuss treatment plan/work up.

## 2021-09-02 ENCOUNTER — Other Ambulatory Visit: Payer: Self-pay | Admitting: Family

## 2021-09-06 ENCOUNTER — Encounter: Payer: Self-pay | Admitting: Cardiology

## 2021-09-06 ENCOUNTER — Ambulatory Visit (INDEPENDENT_AMBULATORY_CARE_PROVIDER_SITE_OTHER): Payer: Medicare Other | Admitting: Cardiology

## 2021-09-06 VITALS — BP 140/60 | HR 73 | Ht 61.0 in | Wt 132.0 lb

## 2021-09-06 DIAGNOSIS — E7800 Pure hypercholesterolemia, unspecified: Secondary | ICD-10-CM

## 2021-09-06 DIAGNOSIS — I10 Essential (primary) hypertension: Secondary | ICD-10-CM

## 2021-09-06 DIAGNOSIS — R072 Precordial pain: Secondary | ICD-10-CM | POA: Diagnosis not present

## 2021-09-06 NOTE — Patient Instructions (Signed)

## 2021-09-07 ENCOUNTER — Ambulatory Visit (INDEPENDENT_AMBULATORY_CARE_PROVIDER_SITE_OTHER): Payer: Medicare Other | Admitting: Family

## 2021-09-07 VITALS — BP 176/62 | HR 73 | Temp 98.2°F | Resp 16 | Ht 61.0 in | Wt 132.0 lb

## 2021-09-07 DIAGNOSIS — I10 Essential (primary) hypertension: Secondary | ICD-10-CM

## 2021-09-07 DIAGNOSIS — R160 Hepatomegaly, not elsewhere classified: Secondary | ICD-10-CM | POA: Diagnosis not present

## 2021-09-07 DIAGNOSIS — R634 Abnormal weight loss: Secondary | ICD-10-CM | POA: Diagnosis not present

## 2021-09-07 NOTE — Assessment & Plan Note (Addendum)
BP is up today.  Suspect due to anxiety surrounding today's discussion and having all her family members with her. Plan to repeat next visit. If still elevated, will adjust medication.   BP Readings from Last 3 Encounters:  09/07/21 (!) 176/62  09/06/21 140/60  07/24/21 (!) 134/54

## 2021-09-07 NOTE — Progress Notes (Addendum)
Subjective:   By signing my name below, I, Angelica Benson, attest that this documentation has been prepared under the direction and in the presence of [name], 09/07/2021     Patient ID: Angelica Benson, female    DOB: 06-10-1928, 88 y.o.   MRN: [id number]  No chief complaint on file.   HPI Patient is in today for an office visit. She is with her 2 daughters, grand-daughter, grandson and a cousin to discuss abnormal MRI results.   Her recent weight loss led to a CT of her chest/abdomen and pelvis.  This imaging revealed the following abnormalities:  Hepatobiliary: There is a heterogeneously enhancing mass in the left lobe of the liver measuring 7.0 x 6.1 x 4.7 cm (previously measuring up to 4.1 cm). Subcentimeter hypodense lesions in the liver have not significantly changed. No new liver lesions are identified.  As a result, an MRI of the liver was performed.  MRI noted the following:   Increased size of heterogeneous hypervascular mass in the left hepatic lobe, suspicious for hepatocellular carcinoma.   Two sub-cm hypovascular lesions in the anterior right and left hepatic lobes are indeterminate, but stable in size since prior exam and therefore likely benign. Recommend continued attention on follow-up imaging.  Of note, the above liver lesion had been followed back in 2019.  MRI performed 10/03/17 noted the following:  IMPRESSION: 1. Lesion in the liver continues to have unusual indeterminate imaging characteristics. While this has grown slightly compared to the prior examination from 2016, the relatively limited growth does suggest a benign lesion, likely an atypical hemangioma.   Weight - Her daughter reports that the patient's weight is decreasing. The patient reports that she is eating well and is satisfied with her weight. Her daughter reports that she is eating a bit more now compared to a few months ago.   Wt Readings from Last 3 Encounters:  09/07/21  132 lb (59.9 kg)  09/06/21 132 lb (59.9 kg)  07/24/21 130 lb (59 kg)     Health Maintenance Due  Topic Date Due   TETANUS/TDAP  Never done    Past Medical History:  Diagnosis Date   Arthritis    CHF (congestive heart failure) (HCC)    Chronic asthma    Chronic headaches    Constipation    Diabetes mellitus without complication ([place])    Hemorrhoids without complication    Hypertension     Past Surgical History:  Procedure Laterality Date   ABDOMINAL HYSTERECTOMY     BACK SURGERY     CHOLECYSTECTOMY     COLONOSCOPY  09/04/2001   Two rectal polyps. Small internal hemorrhoids   ESOPHAGOGASTRODUODENOSCOPY  09/04/2001   Two apparent antral polyps with one possible gastroesophageal juction polyp versus enlarged fold or focal gastritis   KNEE SURGERY      Family History  Problem Relation Age of Onset   Diabetes Son    Hypertension Son    Cancer Sister        breast   Diabetes Daughter        plus 2 grandkids   Stroke Son        30moage dx, has enlarged heart   Heart disease Son        passed away    Social History   Socioeconomic History   Marital status: Divorced    Spouse name: Not on file   Number of children: Not on file   Years of education: Not  on file   Highest education level: Not on file  Occupational History   Not on file  Tobacco Use   Smoking status: Never   Smokeless tobacco: Never  Vaping Use   Vaping Use: Never used  Substance and Sexual Activity   Alcohol use: No   Drug use: No   Sexual activity: Not Currently  Other Topics Concern   Not on file  Social History Narrative   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     Has 4 daughters and 3 living sons (one son died).    Uses walker.     Divorced at age 88   Social Determinants of Health   Financial Resource Strain: Not on file  Food Insecurity: Not on file  Transportation Needs: Not on file  Physical Activity: Not on file  Stress: Not on file  Social Connections: Not on file   Intimate Partner Violence: Not on file    Outpatient Medications Prior to Visit  Medication Sig Dispense Refill   Accu-Chek FastClix Lancets MISC Use once a day 102 each 3   acetaminophen (TYLENOL) 500 MG tablet Take 2 tablets (1,000 mg total) by mouth 2 (two) times daily as needed for moderate pain. 30 tablet 0   albuterol (VENTOLIN HFA) 108 (90 Base) MCG/ACT inhaler USE 2 INHALATIONS BY MOUTH  EVERY 6 HOURS AS NEEDED FOR WHEEZING OR SHORTNESS OF  BREATH 8 g 0   aspirin EC 81 MG tablet Take 81 mg by mouth daily.     atorvastatin (LIPITOR) 10 MG tablet TAKE 1 TABLET BY MOUTH  DAILY 100 tablet 2   betamethasone dipropionate 0.05 % cream Apply topically 2 (two) times daily. 30 g 0   escitalopram (LEXAPRO) 5 MG tablet Take 1 tablet (5 mg total) by mouth daily. 30 tablet 1   fluticasone (FLONASE) 50 MCG/ACT nasal spray USE 2 SPRAYS IN BOTH  NOSTRILS DAILY 48 g 3   glucose blood (ACCU-CHEK GUIDE) test strip USE ONE TEST STRIP AS  INSTRUCTED 100 strip 3   lisinopril (ZESTRIL) 10 MG tablet TAKE 1 TABLET BY MOUTH DAILY 100 tablet 2   metFORMIN (GLUCOPHAGE) 500 MG tablet TAKE 1 TABLET BY MOUTH TWICE  DAILY WITH A MEAL 180 tablet 0   mometasone-formoterol (DULERA) 200-5 MCG/ACT AERO USE 1 INHALATION BY MOUTH  EVERY 12 HOURS 26 g 1   Multiple Vitamins-Minerals (CENTRUM SILVER 50+WOMEN) TABS Take 1 tablet by mouth daily.     nitroGLYCERIN (NITROSTAT) 0.4 MG SL tablet DISSOLVE 1 TABLET UNDER THE TONGUE EVERY 5 MINUTES AS  NEEDED FOR CHEST PAIN. MAX  OF 3 TABLETS IN 15 MINUTES. CALL 911 IF PAIN PERSISTS. 25 tablet 1   omeprazole (PRILOSEC) 20 MG capsule TAKE 1 CAPSULE BY MOUTH DAILY 30 capsule 11   Zoster Vaccine Adjuvanted [facility]) injection Inject 0.'5mg'$  IM now and again in 2-6 months. (Patient not taking: Reported on 09/06/2021) 0.5 mL 1   No facility-administered medications prior to visit.    Allergies  Allergen Reactions   Buprenorphine Hcl Rash   Morphine And Related Rash    ROS See HPI     Objective:    Physical Exam Constitutional:      General: She is not in acute distress.    Appearance: Normal appearance. She is not ill-appearing.  HENT:     Head: Normocephalic and atraumatic.     Right Ear: External ear normal.     Left Ear: External ear normal.  Eyes:  Extraocular Movements: Extraocular movements intact.     Pupils: Pupils are equal, round, and reactive to light.  Cardiovascular:     Rate and Rhythm: Normal rate and regular rhythm.     Heart sounds: Normal heart sounds. No murmur heard.   No gallop.  Pulmonary:     Effort: Pulmonary effort is normal. No respiratory distress.     Breath sounds: Normal breath sounds. No wheezing or rales.  Skin:    General: Skin is warm and dry.  Neurological:     Mental Status: She is alert and oriented to person, place, and time.  Psychiatric:        Mood and Affect: Mood normal.        Behavior: Behavior normal.        Judgment: Judgment normal.    BP (!) 176/62 (BP Location: Left Arm, Patient Position: Sitting, Cuff Size: Small)   Pulse 73   Temp 98.2 F (36.8 C) (Oral)   Resp 16   Ht '5\' 1"'$  (1.549 m)   Wt 132 lb (59.9 kg)   SpO2 100%   BMI 24.94 kg/m  Wt Readings from Last 3 Encounters:  09/07/21 132 lb (59.9 kg)  09/06/21 132 lb (59.9 kg)  07/24/21 130 lb (59 kg)       Assessment & Plan:   Problem List Items Addressed This Visit       Unprioritized   Weight loss    She continues to lose weight which I suspect is due to underlying liver malignancy. See discussion below.        Liver mass - Primary    Enlarging. Discussed finding of enlarging mass in liver concerning for malignancy with patient and family members.  Patient states she does not want any major surgeries, but she may consider a liver biopsy and other treatments. She would like to meet with oncology for consultation and further discussion.  Patient's and family's questions are answered to the best of my ability.        Relevant  Orders   Ambulatory referral to Hematology / Oncology   Benign essential hypertension    BP is up today.  Suspect due to anxiety surrounding today's discussion and having all her family members with her. Plan to repeat next visit. If still elevated, will adjust medication.   BP Readings from Last 3 Encounters:  09/07/21 (!) 176/62  09/06/21 140/60  07/24/21 (!) 134/54        40 minutes were spent on today's visit. Time was spent counseling patient and family on diagnostic imaging findings as well as work up. Time was also spent reviewing medical record.    No orders of the defined types were placed in this encounter.   I, Nance Pear, NP, personally preformed the services described in this documentation.  All medical record entries made by the scribe were at my direction and in my presence.  I have reviewed the chart and discharge instructions (if applicable) and agree that the record reflects my personal performance and is accurate and complete. 09/07/2021   I,Amber Collins,acting as a scribe for Nance Pear, NP.,have documented all relevant documentation on the behalf of Nance Pear, NP,as directed by  Nance Pear, NP while in the presence of Nance Pear, NP.    Nance Pear, NP

## 2021-09-07 NOTE — Assessment & Plan Note (Addendum)
She continues to lose weight which I suspect is due to underlying liver malignancy. See discussion below.

## 2021-09-07 NOTE — Assessment & Plan Note (Signed)
Enlarging. Discussed finding of enlarging mass in liver concerning for malignancy with patient and family members.  Patient states she does not want any major surgeries, but she may consider a liver biopsy and other treatments. She would like to meet with oncology for consultation and further discussion.  Patient's and family's questions are answered to the best of my ability.

## 2021-09-07 NOTE — Addendum Note (Signed)
Addended by: Debbrah Alar on: 09/07/2021 03:28 PM   Modules accepted: Level of Service

## 2021-09-12 ENCOUNTER — Inpatient Hospital Stay (HOSPITAL_BASED_OUTPATIENT_CLINIC_OR_DEPARTMENT_OTHER): Payer: Medicare Other | Admitting: Hematology & Oncology

## 2021-09-12 ENCOUNTER — Other Ambulatory Visit: Payer: Self-pay

## 2021-09-12 ENCOUNTER — Encounter: Payer: Self-pay | Admitting: Hematology & Oncology

## 2021-09-12 ENCOUNTER — Inpatient Hospital Stay: Payer: Medicare Other | Attending: Hematology & Oncology

## 2021-09-12 VITALS — BP 165/59 | HR 64 | Temp 97.8°F | Resp 18 | Ht 61.0 in | Wt 130.0 lb

## 2021-09-12 DIAGNOSIS — K649 Unspecified hemorrhoids: Secondary | ICD-10-CM | POA: Diagnosis not present

## 2021-09-12 DIAGNOSIS — E119 Type 2 diabetes mellitus without complications: Secondary | ICD-10-CM | POA: Insufficient documentation

## 2021-09-12 DIAGNOSIS — I509 Heart failure, unspecified: Secondary | ICD-10-CM | POA: Diagnosis not present

## 2021-09-12 DIAGNOSIS — K5900 Constipation, unspecified: Secondary | ICD-10-CM | POA: Diagnosis not present

## 2021-09-12 DIAGNOSIS — D376 Neoplasm of uncertain behavior of liver, gallbladder and bile ducts: Secondary | ICD-10-CM | POA: Insufficient documentation

## 2021-09-12 DIAGNOSIS — R160 Hepatomegaly, not elsewhere classified: Secondary | ICD-10-CM | POA: Insufficient documentation

## 2021-09-12 DIAGNOSIS — R634 Abnormal weight loss: Secondary | ICD-10-CM | POA: Insufficient documentation

## 2021-09-12 DIAGNOSIS — I110 Hypertensive heart disease with heart failure: Secondary | ICD-10-CM | POA: Diagnosis not present

## 2021-09-12 DIAGNOSIS — Z809 Family history of malignant neoplasm, unspecified: Secondary | ICD-10-CM | POA: Insufficient documentation

## 2021-09-12 LAB — CMP (CANCER CENTER ONLY)
ALT: 12 U/L (ref 0–44)
AST: 23 U/L (ref 15–41)
Albumin: 4.3 g/dL (ref 3.5–5.0)
Alkaline Phosphatase: 57 U/L (ref 38–126)
Anion gap: 8 (ref 5–15)
BUN: 20 mg/dL (ref 8–23)
CO2: 31 mmol/L (ref 22–32)
Calcium: 10.5 mg/dL — ABNORMAL HIGH (ref 8.9–10.3)
Chloride: 103 mmol/L (ref 98–111)
Creatinine: 0.95 mg/dL (ref 0.44–1.00)
GFR, Estimated: 57 mL/min — ABNORMAL LOW (ref >60)
Glucose, Bld: 111 mg/dL — ABNORMAL HIGH (ref 70–99)
Potassium: 5 mmol/L (ref 3.5–5.1)
Sodium: 142 mmol/L (ref 135–145)
Total Bilirubin: 0.4 mg/dL (ref 0.3–1.2)
Total Protein: 7.5 g/dL (ref 6.5–8.1)

## 2021-09-12 LAB — CEA (IN HOUSE-CHCC): CEA (CHCC-In House): 5.28 ng/mL — ABNORMAL HIGH (ref 0.00–5.00)

## 2021-09-12 LAB — CBC WITH DIFFERENTIAL (CANCER CENTER ONLY)
Abs Immature Granulocytes: 0.01 10*3/uL (ref 0.00–0.07)
Basophils Absolute: 0 10*3/uL (ref 0.0–0.1)
Basophils Relative: 1 %
Eosinophils Absolute: 0.1 10*3/uL (ref 0.0–0.5)
Eosinophils Relative: 3 %
HCT: 36.3 % (ref 36.0–46.0)
Hemoglobin: 11.6 g/dL — ABNORMAL LOW (ref 12.0–15.0)
Immature Granulocytes: 0 %
Lymphocytes Relative: 34 %
Lymphs Abs: 1.6 10*3/uL (ref 0.7–4.0)
MCH: 30.4 pg (ref 26.0–34.0)
MCHC: 32 g/dL (ref 30.0–36.0)
MCV: 95 fL (ref 80.0–100.0)
Monocytes Absolute: 0.4 10*3/uL (ref 0.1–1.0)
Monocytes Relative: 8 %
Neutro Abs: 2.6 10*3/uL (ref 1.7–7.7)
Neutrophils Relative %: 54 %
Platelet Count: 241 10*3/uL (ref 150–400)
RBC: 3.82 MIL/uL — ABNORMAL LOW (ref 3.87–5.11)
RDW: 13.5 % (ref 11.5–15.5)
WBC Count: 4.7 10*3/uL (ref 4.0–10.5)
nRBC: 0 % (ref 0.0–0.2)

## 2021-09-12 LAB — LACTATE DEHYDROGENASE: LDH: 157 U/L (ref 98–192)

## 2021-09-12 LAB — PREALBUMIN: Prealbumin: 19.3 mg/dL (ref 18–38)

## 2021-09-12 NOTE — Progress Notes (Signed)
Referral MD  Reason for Referral: Liver Mass  Chief Complaint  Patient presents with   New Patient (Initial Visit)  : I have a tumor in my liver.  HPI: Angelica Benson is an incredibly charming and incredibly youthful 88 year old Afro-American female.  She comes in with a daughter.  [place]  I think she had 10 siblings.  She has 8 children.  She is incredibly fun to talk to.  She lives in the assisted living.  She is followed by Debbrah Alar.  As always, Dr. Inda Castle has been incredibly thorough with evaluating any issues.  Angelica Benson has been losing weight.  She apparently weighed over 300 pounds many years ago.  It seems like over the past several months, the weight has been coming off her.  She ultimately had a CT of the abdomen and pelvis.  There has been no abdominal pain.  There is been no change in bowel or bladder habits.  There has been no bleeding.  She has had no nausea or vomiting.  On May 4, a CT scan was done.  This showed a 7 x 6.1 x 4.7 cm enhancing mass in the left lobe of the liver.  There is some subcentimeter lesions in the liver that were not changed.  There is no adenopathy noted.  She had no ascites.  An MRI was done a couple weeks later.  This confirmed a 6.8 x 5.8 cm mass in the left hepatic lobe.  It was hypervascular.  There were indeterminant 2 subcentimeter lesions in the right and left hepatic lobes.  There is no adenopathy.  There is no ascites.  It was felt that this was suspicious for hepatocellular carcinoma.  She has not had any tumor markers done.  She does not smoke.  She does not drink.  There is no history of hepatitis.  She has no history of sickle cell.  There is a history of cancer in the family but she is not sure what kind of cancer was noted.  Her appetite seems to be okay.  Overall, I would say performance status right now is ECOG 1.  Past Medical History:  Diagnosis Date   Arthritis    CHF (congestive heart  failure) (HCC)    Chronic asthma    Chronic headaches    Constipation    Diabetes mellitus without complication ([place])    Hemorrhoids without complication    Hypertension   :   Past Surgical History:  Procedure Laterality Date   ABDOMINAL HYSTERECTOMY     BACK SURGERY     CHOLECYSTECTOMY     COLONOSCOPY  09/04/2001   Two rectal polyps. Small internal hemorrhoids   ESOPHAGOGASTRODUODENOSCOPY  09/04/2001   Two apparent antral polyps with one possible gastroesophageal juction polyp versus enlarged fold or focal gastritis   KNEE SURGERY    :   Current Outpatient Medications:    Accu-Chek FastClix Lancets MISC, Use once a day, Disp: 102 each, Rfl: 3   acetaminophen (TYLENOL) 500 MG tablet, Take 2 tablets (1,000 mg total) by mouth 2 (two) times daily as needed for moderate pain., Disp: 30 tablet, Rfl: 0   albuterol (VENTOLIN HFA) 108 (90 Base) MCG/ACT inhaler, USE 2 INHALATIONS BY MOUTH  EVERY 6 HOURS AS NEEDED FOR WHEEZING OR SHORTNESS OF  BREATH, Disp: 8 g, Rfl: 0   aspirin EC 81 MG tablet, Take 81 mg by mouth daily., Disp: , Rfl:    atorvastatin (LIPITOR) 10 MG tablet,  TAKE 1 TABLET BY MOUTH  DAILY, Disp: 100 tablet, Rfl: 2   betamethasone dipropionate 0.05 % cream, Apply topically 2 (two) times daily., Disp: 30 g, Rfl: 0   escitalopram (LEXAPRO) 5 MG tablet, Take 1 tablet (5 mg total) by mouth daily., Disp: 30 tablet, Rfl: 1   fluticasone (FLONASE) 50 MCG/ACT nasal spray, USE 2 SPRAYS IN BOTH  NOSTRILS DAILY, Disp: 48 g, Rfl: 3   glucose blood (ACCU-CHEK GUIDE) test strip, USE ONE TEST STRIP AS  INSTRUCTED, Disp: 100 strip, Rfl: 3   lisinopril (ZESTRIL) 10 MG tablet, TAKE 1 TABLET BY MOUTH DAILY, Disp: 100 tablet, Rfl: 2   metFORMIN (GLUCOPHAGE) 500 MG tablet, TAKE 1 TABLET BY MOUTH TWICE  DAILY WITH A MEAL, Disp: 180 tablet, Rfl: 0   mometasone-formoterol (DULERA) 200-5 MCG/ACT AERO, USE 1 INHALATION BY MOUTH  EVERY 12 HOURS, Disp: 26 g, Rfl: 1   Multiple Vitamins-Minerals (CENTRUM  SILVER 50+WOMEN) TABS, Take 1 tablet by mouth daily., Disp: , Rfl:    omeprazole (PRILOSEC) 20 MG capsule, TAKE 1 CAPSULE BY MOUTH DAILY, Disp: 30 capsule, Rfl: 11   nitroGLYCERIN (NITROSTAT) 0.4 MG SL tablet, DISSOLVE 1 TABLET UNDER THE TONGUE EVERY 5 MINUTES AS  NEEDED FOR CHEST PAIN. MAX  OF 3 TABLETS IN 15 MINUTES. CALL 911 IF PAIN PERSISTS. (Patient not taking: Reported on 09/12/2021), Disp: 25 tablet, Rfl: 1   Zoster Vaccine Adjuvanted [facility]) injection, Inject 0.'5mg'$  IM now and again in 2-6 months. (Patient not taking: Reported on 09/06/2021), Disp: 0.5 mL, Rfl: 1:  :   Allergies  Allergen Reactions   Buprenorphine Hcl Rash   Morphine And Related Rash  :   Family History  Problem Relation Age of Onset   Diabetes Son    Hypertension Son    Cancer Sister        breast   Diabetes Daughter        plus 2 grandkids   Stroke Son        52moage dx, has enlarged heart   Heart disease Son        passed away  :   Social History   Socioeconomic History   Marital status: Divorced    Spouse name: Not on file   Number of children: Not on file   Years of education: Not on file   Highest education level: Not on file  Occupational History   Not on file  Tobacco Use   Smoking status: Never   Smokeless tobacco: Never  Vaping Use   Vaping Use: Never used  Substance and Sexual Activity   Alcohol use: No   Drug use: No   Sexual activity: Not Currently  Other Topics Concern   Not on file  Social History Narrative   Lives at home by herself, aide in the am.     Has 4 daughters and 3 living sons (one son died).    Uses walker.     Divorced at age 88  Social Determinants of Health   Financial Resource Strain: Not on file  Food Insecurity: Not on file  Transportation Needs: Not on file  Physical Activity: Not on file  Stress: Not on file  Social Connections: Not on file  Intimate Partner Violence: Not on file  :  Review of Systems  Constitutional:  Positive for  weight loss.  HENT: Negative.    Eyes: Negative.   Respiratory: Negative.    Cardiovascular: Negative.   Gastrointestinal: Negative.   Genitourinary:  Negative.   Musculoskeletal: Negative.   Skin: Negative.   Neurological: Negative.   Endo/Heme/Allergies: Negative.   Psychiatric/Behavioral: Negative.      Exam: '@IPVITALS'$ @  Her vital signs are temperature of 97.8.  Pulse 64.  Blood pressure 165/59.  Weight is 130 pounds.  Physical Exam Vitals reviewed.  HENT:     Head: Normocephalic and atraumatic.  Eyes:     Pupils: Pupils are equal, round, and reactive to light.  Cardiovascular:     Rate and Rhythm: Normal rate and regular rhythm.     Heart sounds: Normal heart sounds.  Pulmonary:     Effort: Pulmonary effort is normal.     Breath sounds: Normal breath sounds.  Abdominal:     General: Bowel sounds are normal.     Palpations: Abdomen is soft.  Musculoskeletal:        General: No tenderness or deformity. Normal range of motion.     Cervical back: Normal range of motion.  Lymphadenopathy:     Cervical: No cervical adenopathy.  Skin:    General: Skin is warm and dry.     Findings: No erythema or rash.  Neurological:     Mental Status: She is alert and oriented to person, place, and time.  Psychiatric:        Behavior: Behavior normal.        Thought Content: Thought content normal.        Judgment: Judgment normal.    Recent Labs    09/12/21 1015  WBC 4.7  HGB 11.6*  HCT 36.3  PLT 241    Recent Labs    09/12/21 1015  NA 142  K 5.0  CL 103  CO2 31  GLUCOSE 111*  BUN 20  CREATININE 0.95  CALCIUM 10.5*    Blood smear review: None  Pathology: None    Assessment and Plan: Angelica Benson is a very charming 88 year old Afro-American female.  She has a hepatic mass.  It does not appear to be all that rapidly growing.  Has been noted for the past 4 years.  She has no risk factors for hepatocellular carcinoma.  There is no obvious risk factors for  cholangiocarcinoma.  The CT scan/MRI does not show anything with the pancreas.  Hopefully, tumor markers will be able to give [place] a diagnosis.  We will have to see what the alpha-fetoprotein is.  We will have to see what her CA 19-9 is and CEA level is.  I think the last thing we would want to do is a biopsy on her.  This would be invasive.  I would hate to have to go down that road.  I think the real question is what can be done about this if we do find malignancy.  This is a solitary mass from what I can tell.  However, I highly, highly doubt that she would be considered for surgical resection.  It is possible that we might consider intrahepatic therapy to try to help shrink the tumor.  Maybe, we could use neoadjuvant immunotherapy to help shrink the tumor if this is hepatocellular carcinoma.  If this is an adenocarcinoma, this might be a little more difficult to treat.  Again, our goal here is quality of life.  This is going to be critical.  We really need to focus on this with Ms. Weinel.  I did give her a prayer blanket.  She has incredible faith.  We had a wonderful fellowship.  We will see what the  tumor markers show.  We will then plan to get her back once we can have those results then.

## 2021-09-13 LAB — AFP TUMOR MARKER: AFP, Serum, Tumor Marker: <1.8 ng/mL (ref 0.0–8.7)

## 2021-09-13 LAB — CANCER ANTIGEN 19-9: CA 19-9: 63 U/mL — ABNORMAL HIGH (ref 0–35)

## 2021-09-14 ENCOUNTER — Other Ambulatory Visit: Payer: Self-pay | Admitting: Family

## 2021-09-14 DIAGNOSIS — F32A Depression, unspecified: Secondary | ICD-10-CM

## 2021-09-14 NOTE — Addendum Note (Signed)
Addended by: Burney Gauze R on: 09/14/2021 01:45 PM   Modules accepted: Orders

## 2021-09-16 ENCOUNTER — Other Ambulatory Visit: Payer: Self-pay | Admitting: Family

## 2021-09-17 ENCOUNTER — Other Ambulatory Visit: Payer: Self-pay | Admitting: Family

## 2021-09-17 NOTE — Progress Notes (Unsigned)
Sandi Mariscal, MD  Donita Brooks D OK for liver lesion Bx.   CT AP - image 47, series 2 - dominant lesion in left lobe of the liver.   Abdominal MRI - 08/29/21 - other small lesions felt to be B9.   Cathren Harsh

## 2021-09-29 ENCOUNTER — Other Ambulatory Visit: Payer: Self-pay | Admitting: Family

## 2021-10-10 ENCOUNTER — Emergency Department (HOSPITAL_COMMUNITY)
Admission: EM | Admit: 2021-10-10 | Discharge: 2021-10-10 | Disposition: A | Discharge status: Home / Self Care | Payer: Medicare Other | Attending: Emergency Medicine | Admitting: Emergency Medicine

## 2021-10-10 ENCOUNTER — Emergency Department (HOSPITAL_COMMUNITY): Payer: Medicare Other

## 2021-10-10 ENCOUNTER — Encounter: Payer: Self-pay | Admitting: Family

## 2021-10-10 ENCOUNTER — Encounter (HOSPITAL_COMMUNITY): Payer: Self-pay

## 2021-10-10 DIAGNOSIS — I509 Heart failure, unspecified: Secondary | ICD-10-CM | POA: Diagnosis not present

## 2021-10-10 DIAGNOSIS — R0602 Shortness of breath: Secondary | ICD-10-CM | POA: Diagnosis not present

## 2021-10-10 DIAGNOSIS — R079 Chest pain, unspecified: Secondary | ICD-10-CM | POA: Diagnosis not present

## 2021-10-10 DIAGNOSIS — Z7984 Long term (current) use of oral hypoglycemic drugs: Secondary | ICD-10-CM | POA: Diagnosis not present

## 2021-10-10 DIAGNOSIS — Z7951 Long term (current) use of inhaled steroids: Secondary | ICD-10-CM | POA: Insufficient documentation

## 2021-10-10 DIAGNOSIS — Z743 Need for continuous supervision: Secondary | ICD-10-CM | POA: Diagnosis not present

## 2021-10-10 DIAGNOSIS — E119 Type 2 diabetes mellitus without complications: Secondary | ICD-10-CM | POA: Diagnosis not present

## 2021-10-10 DIAGNOSIS — J45909 Unspecified asthma, uncomplicated: Secondary | ICD-10-CM | POA: Diagnosis not present

## 2021-10-10 DIAGNOSIS — Z7982 Long term (current) use of aspirin: Secondary | ICD-10-CM | POA: Diagnosis not present

## 2021-10-10 DIAGNOSIS — I110 Hypertensive heart disease with heart failure: Secondary | ICD-10-CM | POA: Insufficient documentation

## 2021-10-10 DIAGNOSIS — R5383 Other fatigue: Secondary | ICD-10-CM | POA: Diagnosis not present

## 2021-10-10 DIAGNOSIS — R0789 Other chest pain: Secondary | ICD-10-CM | POA: Insufficient documentation

## 2021-10-10 DIAGNOSIS — Z79899 Other long term (current) drug therapy: Secondary | ICD-10-CM | POA: Diagnosis not present

## 2021-10-10 LAB — HEPATIC FUNCTION PANEL
ALT: 14 U/L (ref 0–44)
AST: 23 U/L (ref 15–41)
Albumin: 3.4 g/dL — ABNORMAL LOW (ref 3.5–5.0)
Alkaline Phosphatase: 51 U/L (ref 38–126)
Bilirubin, Direct: <0.1 mg/dL (ref 0.0–0.2)
Total Bilirubin: 0.3 mg/dL (ref 0.3–1.2)
Total Protein: 6.4 g/dL — ABNORMAL LOW (ref 6.5–8.1)

## 2021-10-10 LAB — TROPONIN I (HIGH SENSITIVITY)
Troponin I (High Sensitivity): 7 ng/L (ref <18)
Troponin I (High Sensitivity): 7 ng/L (ref <18)

## 2021-10-10 LAB — BASIC METABOLIC PANEL
Anion gap: 6 (ref 5–15)
BUN: 20 mg/dL (ref 8–23)
CO2: 28 mmol/L (ref 22–32)
Calcium: 9.7 mg/dL (ref 8.9–10.3)
Chloride: 104 mmol/L (ref 98–111)
Creatinine, Ser: 0.94 mg/dL (ref 0.44–1.00)
GFR, Estimated: 57 mL/min — ABNORMAL LOW (ref >60)
Glucose, Bld: 106 mg/dL — ABNORMAL HIGH (ref 70–99)
Potassium: 3.9 mmol/L (ref 3.5–5.1)
Sodium: 138 mmol/L (ref 135–145)

## 2021-10-10 LAB — CBC
HCT: 33.6 % — ABNORMAL LOW (ref 36.0–46.0)
Hemoglobin: 10.7 g/dL — ABNORMAL LOW (ref 12.0–15.0)
MCH: 30.2 pg (ref 26.0–34.0)
MCHC: 31.8 g/dL (ref 30.0–36.0)
MCV: 94.9 fL (ref 80.0–100.0)
Platelets: 251 10*3/uL (ref 150–400)
RBC: 3.54 MIL/uL — ABNORMAL LOW (ref 3.87–5.11)
RDW: 14 % (ref 11.5–15.5)
WBC: 5.2 10*3/uL (ref 4.0–10.5)
nRBC: 0 % (ref 0.0–0.2)

## 2021-10-10 LAB — D-DIMER, QUANTITATIVE: D-Dimer, Quant: 0.59 ug/mL-FEU — ABNORMAL HIGH (ref 0.00–0.50)

## 2021-10-10 NOTE — Discharge Instructions (Signed)
All the blood work, EKG and chest x-ray today look good.  There is no sign of blood clots or heart attack.  The symptoms you are having is most likely related to your asthma and being outside with the poor air quality.  Try to stay indoors until the air quality improves.  Use your inhaler as needed.  However if you are at home and you start having severe shortness of breath, passing out or other concerns return to the emergency room.

## 2021-10-10 NOTE — ED Triage Notes (Signed)
Pt states that she has had intermittent CP and SOB that started this morning. Pt called 911 earlier this morning, but felt better and denied transport. Later pain returned and sought tx.

## 2021-10-10 NOTE — ED Provider Notes (Signed)
Angelica Benson   CSN: [id number] Arrival date & time: 10/10/21  1525     History  Chief Complaint  Patient presents with   Chest Pain   Shortness of Breath    Angelica Benson is a 88 y.o. female.  Patient is a 88 year old female with a history of recent diagnosis of liver mass which is still under investigation getting a biopsy on Monday, unintentional weight loss, asthma, CHF, hypertension, diabetes, constipation who is presenting today with complaint of shortness of breath and chest tightness.  She reports the symptoms have been intermittent today and she first noticed it this morning.  She did call 911 but that time they arrived she was feeling better.  She did get her inhaler from her aid and reports that did make her feel better.  After that she was able to walk down the hall and visit some other residents where she lives.  She was sitting watching TV after having lunch and started noting seeing her symptoms return.  She reports all day she has felt a little bit weak but denies any abdominal pain, nausea, vomiting or diarrhea.  She has had no recent medication changes.  She denies any fever or new cough or sputum production.  She does report that yesterday she sat outside for a long time which she is starting to do more now that the weather is getting nicer.  The history is provided by the patient, medical records and a relative.  Chest Pain Associated symptoms: shortness of breath   Shortness of Breath Associated symptoms: chest pain        Home Medications Prior to Admission medications   Medication Sig Start Date End Date Taking? Authorizing Provider  Accu-Chek FastClix Lancets MISC USE AS DIRECTED TO CHECK  BLOOD SUGAR 09/16/21   Debbrah Alar, NP  acetaminophen (TYLENOL) 500 MG tablet Take 2 tablets (1,000 mg total) by mouth 2 (two) times daily as needed for moderate pain. 08/15/17   Debbrah Alar, NP  albuterol  (VENTOLIN HFA) 108 (90 Base) MCG/ACT inhaler USE 2 INHALATIONS BY MOUTH  EVERY 6 HOURS AS NEEDED FOR WHEEZING OR SHORTNESS OF  BREATH 06/01/21   Debbrah Alar, NP  aspirin EC 81 MG tablet Take 81 mg by mouth daily.    [provider]  atorvastatin (LIPITOR) 10 MG tablet TAKE 1 TABLET BY MOUTH  DAILY 08/27/21   Debbrah Alar, NP  betamethasone dipropionate 0.05 % cream Apply topically 2 (two) times daily. 09/08/20   Debbrah Alar, NP  escitalopram (LEXAPRO) 5 MG tablet TAKE 1 TABLET(5 MG) BY MOUTH DAILY 09/14/21   Debbrah Alar, NP  fluticasone [facility]) 50 MCG/ACT nasal spray USE 2 SPRAYS IN BOTH  NOSTRILS DAILY 10/30/20   Debbrah Alar, NP  glucose blood (ACCU-CHEK GUIDE) test strip USE ONE TEST STRIP AS  INSTRUCTED 03/09/21   Debbrah Alar, NP  lisinopril (ZESTRIL) 10 MG tablet TAKE 1 TABLET BY MOUTH DAILY 09/03/21   Debbrah Alar, NP  metFORMIN (GLUCOPHAGE) 500 MG tablet TAKE 1 TABLET BY MOUTH TWICE  DAILY WITH A MEAL 10/01/21   Debbrah Alar, NP  mometasone-formoterol (DULERA) 200-5 MCG/ACT AERO USE 1 INHALATION BY MOUTH  EVERY 12 HOURS 10/30/19   Debbrah Alar, NP  Multiple Vitamins-Minerals (CENTRUM SILVER 50+WOMEN) TABS Take 1 tablet by mouth daily. 04/28/18   Debbrah Alar, NP  nitroGLYCERIN (NITROSTAT) 0.4 MG SL tablet DISSOLVE 1 TABLET UNDER THE TONGUE EVERY 5 MINUTES AS  NEEDED FOR CHEST PAIN. MAX  OF 3 TABLETS IN 15 MINUTES. CALL 911 IF PAIN PERSISTS. Patient not taking: Reported on 09/12/2021 07/24/21   Debbrah Alar, NP  omeprazole (PRILOSEC) 20 MG capsule TAKE 1 CAPSULE BY MOUTH DAILY 07/16/21   Debbrah Alar, NP  Zoster Vaccine Adjuvanted [facility]) injection Inject 0.'5mg'$  IM now and again in 2-6 months. Patient not taking: Reported on 09/06/2021 06/06/20   Debbrah Alar, NP      Allergies    Buprenorphine hcl and Morphine and related    Review of Systems   Review of Systems  Respiratory:  Positive for  shortness of breath.   Cardiovascular:  Positive for chest pain.    Physical Exam Updated Vital Signs BP (!) 162/61   Pulse 66   Temp (!) 97.3 F (36.3 C) (Oral)   Resp 16   SpO2 100%  Physical Exam Vitals and nursing Benson reviewed.  Constitutional:      General: She is not in acute distress.    Appearance: She is well-developed.  HENT:     Head: Normocephalic.     Mouth/Throat:     Mouth: Mucous membranes are moist.  Eyes:     Pupils: Pupils are equal, round, and reactive to light.  Cardiovascular:     Rate and Rhythm: Normal rate.  Pulmonary:     Effort: Pulmonary effort is normal. No respiratory distress.     Breath sounds: Normal breath sounds. No wheezing, rhonchi or rales.  Chest:     Chest wall: No tenderness.  Abdominal:     General: Abdomen is flat.     Palpations: Abdomen is soft.     Tenderness: There is no abdominal tenderness.  Musculoskeletal:     Cervical back: Normal range of motion.     Right lower leg: No edema.     Left lower leg: No edema.  Skin:    General: Skin is warm and dry.  Neurological:     Mental Status: She is alert and oriented to person, place, and time. Mental status is at baseline.  Psychiatric:        Mood and Affect: Mood normal.     ED Results / Procedures / Treatments   Labs (all labs ordered are listed, but only abnormal results are displayed) Labs Reviewed  BASIC METABOLIC PANEL - Abnormal; Notable for the following components:      Result Value   Glucose, Bld 106 (*)    GFR, Estimated 57 (*)    All other components within normal limits  CBC - Abnormal; Notable for the following components:   RBC 3.54 (*)    Hemoglobin 10.7 (*)    HCT 33.6 (*)    All other components within normal limits  HEPATIC FUNCTION PANEL - Abnormal; Notable for the following components:   Total Protein 6.4 (*)    Albumin 3.4 (*)    All other components within normal limits  D-DIMER, QUANTITATIVE - Abnormal; Notable for the following  components:   D-Dimer, Quant 0.59 (*)    All other components within normal limits  BRAIN NATRIURETIC PEPTIDE  TROPONIN I (HIGH SENSITIVITY)  TROPONIN I (HIGH SENSITIVITY)    EKG EKG Interpretation  Date/Time:  Wednesday October 10 2021 15:58:59 EDT Ventricular Rate:  67 PR Interval:  235 QRS Duration: 136 QT Interval:  381 QTC Calculation: 403 R Axis:   -78 Text Interpretation: Sinus rhythm Prolonged PR interval RBBB and LAFB No significant change since last tracing Confirmed by Blanchie Dessert [phone]) on 10/10/2021 4:34:15 PM  Radiology DG Chest 2 View  Result Date: 10/10/2021 CLINICAL DATA:  Chest pain, shortness of breath EXAM: CHEST - 2 VIEW COMPARISON:  Radiograph 06/12/2020 FINDINGS: Unchanged cardiomediastinal silhouette. There is no focal airspace consolidation. There is no pleural effusion. No pneumothorax. There is no acute osseous abnormality. Thoracic spondylosis. IMPRESSION: No evidence of acute cardiopulmonary disease. Electronically Signed   By: Maurine Simmering M.D.   On: 10/10/2021 16:50    Procedures Procedures    Medications Ordered in ED Medications - No data to display  ED Course/ Medical Decision Making/ A&P                           Medical Decision Making Amount and/or Complexity of Data Reviewed External Data Reviewed: notes.    Details: oncology Labs: ordered. Decision-making details documented in ED Course. Radiology: ordered and independent interpretation performed. Decision-making details documented in ED Course. ECG/medicine tests: ordered and independent interpretation performed. Decision-making details documented in ED Course.   Pt with multiple medical problems and comorbidities and presenting today with a complaint that caries a high risk for morbidity and mortality.  Presenting today with complaints of intermittent chest pain and shortness of breath.  She also complains of global weakness.  Patient is well-appearing on exam with mild  hypertension but otherwise normal vital signs.  Oxygen saturation of 100% on room air.  She does have a history of asthma and her chest pain and shortness of breath could be related to her asthma especially because she sat outside for quite some time yesterday and symptoms did seem to improve with her inhaler.  However also concern for ACS, anemia, electrolyte abnormality, PE given recent hepatic mass of unknown origin.  Patient does not display symptoms consistent with significant CHF but does have a history.  I independently interpreted patient's EKG today that shows no acute findings.  Chest x-ray and labs are pending.  Patient complains of some minimal tightness in the chest but otherwise is well-appearing at this time. 7:07 PM I have independently interpreted pt's labs.  CBC, BMP, LFT's, trop x2, d-dimer age adjusted are all wnl. I have independently visualized and interpreted pt's images today. Cxr wnl.  Findings discussed with the patient and her daughter.  Patient still is well-appearing.  She complains of no new symptoms.  Suspect that this is related to her asthma and being outside with poor air quality.  She was given return precautions but at this time she does not meet criteria for admission.  Feel that she is stable to go home.  She has inhalers at home.  No social determinants affecting her discharge today.         Final Clinical Impression(s) / ED Diagnoses Final diagnoses:  Nonspecific chest pain    Rx / DC Orders ED Discharge Orders     None         Blanchie Dessert, MD 10/10/21 1907

## 2021-10-11 ENCOUNTER — Other Ambulatory Visit: Payer: Self-pay | Admitting: Radiology

## 2021-10-11 ENCOUNTER — Other Ambulatory Visit: Payer: Self-pay | Admitting: Student

## 2021-10-11 DIAGNOSIS — R160 Hepatomegaly, not elsewhere classified: Secondary | ICD-10-CM

## 2021-10-12 ENCOUNTER — Ambulatory Visit (HOSPITAL_COMMUNITY)
Admission: RE | Admit: 2021-10-12 | Discharge: 2021-10-12 | Disposition: A | Discharge status: Home / Self Care | Payer: Medicare Other | Source: Ambulatory Visit | Attending: Hematology & Oncology | Admitting: Hematology & Oncology

## 2021-10-12 ENCOUNTER — Encounter (HOSPITAL_COMMUNITY): Payer: Self-pay

## 2021-10-12 ENCOUNTER — Other Ambulatory Visit: Payer: Self-pay | Admitting: Hematology & Oncology

## 2021-10-12 DIAGNOSIS — R160 Hepatomegaly, not elsewhere classified: Secondary | ICD-10-CM | POA: Insufficient documentation

## 2021-10-12 DIAGNOSIS — J45909 Unspecified asthma, uncomplicated: Secondary | ICD-10-CM | POA: Diagnosis not present

## 2021-10-12 DIAGNOSIS — I509 Heart failure, unspecified: Secondary | ICD-10-CM | POA: Diagnosis not present

## 2021-10-12 DIAGNOSIS — E119 Type 2 diabetes mellitus without complications: Secondary | ICD-10-CM | POA: Diagnosis not present

## 2021-10-12 DIAGNOSIS — I700 Atherosclerosis of aorta: Secondary | ICD-10-CM | POA: Insufficient documentation

## 2021-10-12 DIAGNOSIS — R634 Abnormal weight loss: Secondary | ICD-10-CM | POA: Insufficient documentation

## 2021-10-12 DIAGNOSIS — E041 Nontoxic single thyroid nodule: Secondary | ICD-10-CM | POA: Insufficient documentation

## 2021-10-12 DIAGNOSIS — R970 Elevated carcinoembryonic antigen [CEA]: Secondary | ICD-10-CM | POA: Insufficient documentation

## 2021-10-12 DIAGNOSIS — Z9049 Acquired absence of other specified parts of digestive tract: Secondary | ICD-10-CM | POA: Insufficient documentation

## 2021-10-12 DIAGNOSIS — M1990 Unspecified osteoarthritis, unspecified site: Secondary | ICD-10-CM | POA: Diagnosis not present

## 2021-10-12 DIAGNOSIS — I110 Hypertensive heart disease with heart failure: Secondary | ICD-10-CM | POA: Diagnosis not present

## 2021-10-12 DIAGNOSIS — C229 Malignant neoplasm of liver, not specified as primary or secondary: Secondary | ICD-10-CM | POA: Diagnosis not present

## 2021-10-12 LAB — CBC WITH DIFFERENTIAL/PLATELET
Abs Immature Granulocytes: 0.02 10*3/uL (ref 0.00–0.07)
Basophils Absolute: 0 10*3/uL (ref 0.0–0.1)
Basophils Relative: 1 %
Eosinophils Absolute: 0.2 10*3/uL (ref 0.0–0.5)
Eosinophils Relative: 4 %
HCT: 35 % — ABNORMAL LOW (ref 36.0–46.0)
Hemoglobin: 11.2 g/dL — ABNORMAL LOW (ref 12.0–15.0)
Immature Granulocytes: 1 %
Lymphocytes Relative: 41 %
Lymphs Abs: 1.7 10*3/uL (ref 0.7–4.0)
MCH: 30.6 pg (ref 26.0–34.0)
MCHC: 32 g/dL (ref 30.0–36.0)
MCV: 95.6 fL (ref 80.0–100.0)
Monocytes Absolute: 0.4 10*3/uL (ref 0.1–1.0)
Monocytes Relative: 9 %
Neutro Abs: 1.8 10*3/uL (ref 1.7–7.7)
Neutrophils Relative %: 44 %
Platelets: 230 10*3/uL (ref 150–400)
RBC: 3.66 MIL/uL — ABNORMAL LOW (ref 3.87–5.11)
RDW: 14 % (ref 11.5–15.5)
WBC: 4.1 10*3/uL (ref 4.0–10.5)
nRBC: 0 % (ref 0.0–0.2)

## 2021-10-12 LAB — COMPREHENSIVE METABOLIC PANEL
ALT: 14 U/L (ref 0–44)
AST: 24 U/L (ref 15–41)
Albumin: 3.7 g/dL (ref 3.5–5.0)
Alkaline Phosphatase: 53 U/L (ref 38–126)
Anion gap: 7 (ref 5–15)
BUN: 20 mg/dL (ref 8–23)
CO2: 27 mmol/L (ref 22–32)
Calcium: 9.7 mg/dL (ref 8.9–10.3)
Chloride: 105 mmol/L (ref 98–111)
Creatinine, Ser: 0.9 mg/dL (ref 0.44–1.00)
GFR, Estimated: >60 mL/min (ref >60)
Glucose, Bld: 102 mg/dL — ABNORMAL HIGH (ref 70–99)
Potassium: 3.8 mmol/L (ref 3.5–5.1)
Sodium: 139 mmol/L (ref 135–145)
Total Bilirubin: 0.7 mg/dL (ref 0.3–1.2)
Total Protein: 6.7 g/dL (ref 6.5–8.1)

## 2021-10-12 LAB — PROTIME-INR
INR: 0.9 (ref 0.8–1.2)
Prothrombin Time: 12.5 s (ref 11.4–15.2)

## 2021-10-12 LAB — GLUCOSE, CAPILLARY: Glucose-Capillary: 94 mg/dL (ref 70–99)

## 2021-10-12 MED ORDER — FENTANYL CITRATE (PF) 100 MCG/2ML IJ SOLN
INTRAMUSCULAR | Status: AC | PRN
  Administered 2021-10-12: 25 ug via INTRAVENOUS

## 2021-10-12 MED ORDER — HYDROCODONE-ACETAMINOPHEN 5-325 MG PO TABS: 1.0000 | ORAL_TABLET | ORAL | Status: DC | PRN

## 2021-10-12 MED ORDER — MIDAZOLAM HCL 2 MG/2ML IJ SOLN
INTRAMUSCULAR | Status: AC
  Filled 2021-10-12: qty 2

## 2021-10-12 MED ORDER — LIDOCAINE HCL 1 % IJ SOLN
INTRAMUSCULAR | Status: AC
  Filled 2021-10-12: qty 20

## 2021-10-12 MED ORDER — SODIUM CHLORIDE 0.9 % IV SOLN: INTRAVENOUS | Status: DC

## 2021-10-12 MED ORDER — LIDOCAINE HCL 1 % IJ SOLN
INTRAMUSCULAR | Status: AC | PRN
  Administered 2021-10-12: 10 mL via INTRADERMAL

## 2021-10-12 MED ORDER — GELATIN ABSORBABLE 12-7 MM EX MISC
CUTANEOUS | Status: AC | PRN
  Administered 2021-10-12: 1 via TOPICAL

## 2021-10-12 MED ORDER — FENTANYL CITRATE (PF) 100 MCG/2ML IJ SOLN
INTRAMUSCULAR | Status: AC
  Filled 2021-10-12: qty 2

## 2021-10-12 MED ORDER — GELATIN ABSORBABLE 12-7 MM EX MISC
CUTANEOUS | Status: AC
  Filled 2021-10-12: qty 1

## 2021-10-12 MED ORDER — MOMETASONE FURO-FORMOTEROL FUM 200-5 MCG/ACT IN AERO: 1.0000 | INHALATION_SPRAY | Freq: Two times a day (BID) | RESPIRATORY_TRACT | 5 refills | Status: DC

## 2021-10-12 NOTE — Procedures (Signed)
Interventional Radiology Procedure Note  Procedure: US Guided Biopsy of liver mass  Complications: None  Estimated Blood Loss: < 10 mL  Findings: 18 G core biopsy of left lobe liver mass performed under US guidance.  Three core samples obtained and sent to Pathology.  Venetia Night. Kathlene Cote, M.D Pager:  [phone]

## 2021-10-12 NOTE — Consult Note (Signed)
Chief Complaint: Patient was seen in consultation today for  image guided liver mass biopsy  Referring Physician(s): Ennever,Peter R  Supervising Physician: Aletta Edouard  Patient Status: [facility] - Out-pt  History of Present Illness: Angelica Benson is a 88 y.o. female with past medical history of arthritis, CHF, asthma, diabetes, hypertension who presents now with history of weight loss and recent imaging revealing:  1. 7 cm mass in the left lobe of the liver has increased in size compared to the prior exam (previously 4.1 cm). This was previously characterized on MRI is indeterminate, possibly atypical hemangioma. Given interval growth, a follow-up MRI of the abdomen is recommended for further evaluation. 2. Minimal patchy ground-glass and interstitial opacities in the right upper lobe, likely infectious/inflammatory. 3. Subcentimeter incidental left thyroid nodule. No follow-up imaging recommended. 4.  Aortic Atherosclerosis  She presents today for image guided liver mass biopsy for further evaluation.  She has a normal AFP and slightly elevated CEA and CA 19-9 levels.   Past Medical History:  Diagnosis Date   Arthritis    CHF (congestive heart failure) (HCC)    Chronic asthma    Chronic headaches    Constipation    Diabetes mellitus without complication ([place])    Hemorrhoids without complication    Hypertension     Past Surgical History:  Procedure Laterality Date   ABDOMINAL HYSTERECTOMY     BACK SURGERY     CHOLECYSTECTOMY     COLONOSCOPY  09/04/2001   Two rectal polyps. Small internal hemorrhoids   ESOPHAGOGASTRODUODENOSCOPY  09/04/2001   Two apparent antral polyps with one possible gastroesophageal juction polyp versus enlarged fold or focal gastritis   KNEE SURGERY      Allergies: Buprenorphine hcl and Morphine and related  Medications: Prior to Admission medications   Medication Sig Start Date End Date Taking? Authorizing Provider  Accu-Chek  FastClix Lancets MISC USE AS DIRECTED TO CHECK  BLOOD SUGAR 09/16/21  Yes Debbrah Alar, NP  acetaminophen (TYLENOL) 500 MG tablet Take 2 tablets (1,000 mg total) by mouth 2 (two) times daily as needed for moderate pain. 08/15/17  Yes Debbrah Alar, NP  albuterol (VENTOLIN HFA) 108 (90 Base) MCG/ACT inhaler USE 2 INHALATIONS BY MOUTH  EVERY 6 HOURS AS NEEDED FOR WHEEZING OR SHORTNESS OF  BREATH 06/01/21  Yes Debbrah Alar, NP  aspirin EC 81 MG tablet Take 81 mg by mouth daily.   Yes [provider]  atorvastatin (LIPITOR) 10 MG tablet TAKE 1 TABLET BY MOUTH  DAILY 08/27/21  Yes Debbrah Alar, NP  fluticasone (FLONASE) 50 MCG/ACT nasal spray USE 2 SPRAYS IN BOTH  NOSTRILS DAILY 10/30/20  Yes Debbrah Alar, NP  glucose blood (ACCU-CHEK GUIDE) test strip USE ONE TEST STRIP AS  INSTRUCTED 03/09/21  Yes Debbrah Alar, NP  lisinopril (ZESTRIL) 10 MG tablet TAKE 1 TABLET BY MOUTH DAILY 09/03/21  Yes Debbrah Alar, NP  metFORMIN (GLUCOPHAGE) 500 MG tablet TAKE 1 TABLET BY MOUTH TWICE  DAILY WITH A MEAL 10/01/21  Yes Debbrah Alar, NP  mometasone-formoterol (DULERA) 200-5 MCG/ACT AERO Inhale 1 puff into the lungs 2 (two) times daily. 10/12/21  Yes Debbrah Alar, NP  Multiple Vitamins-Minerals (CENTRUM SILVER 50+WOMEN) TABS Take 1 tablet by mouth daily. 04/28/18  Yes Debbrah Alar, NP  betamethasone dipropionate 0.05 % cream Apply topically 2 (two) times daily. 09/08/20   Debbrah Alar, NP  escitalopram (LEXAPRO) 5 MG tablet TAKE 1 TABLET(5 MG) BY MOUTH DAILY 09/14/21   Debbrah Alar, NP  nitroGLYCERIN (  NITROSTAT) 0.4 MG SL tablet DISSOLVE 1 TABLET UNDER THE TONGUE EVERY 5 MINUTES AS  NEEDED FOR CHEST PAIN. MAX  OF 3 TABLETS IN 15 MINUTES. CALL 911 IF PAIN PERSISTS. Patient not taking: Reported on 09/12/2021 07/24/21   Debbrah Alar, NP  omeprazole (PRILOSEC) 20 MG capsule TAKE 1 CAPSULE BY MOUTH DAILY 07/16/21   Debbrah Alar, NP   Zoster Vaccine Adjuvanted [facility]) injection Inject 0.'5mg'$  IM now and again in 2-6 months. Patient not taking: Reported on 09/06/2021 06/06/20   Debbrah Alar, NP     Family History  Problem Relation Age of Onset   Diabetes Son    Hypertension Son    Cancer Sister        breast   Diabetes Daughter        plus 2 grandkids   Stroke Son        80moage dx, has enlarged heart   Heart disease Son        passed away    Social History   Socioeconomic History   Marital status: Divorced    Spouse name: Not on file   Number of children: Not on file   Years of education: Not on file   Highest education level: Not on file  Occupational History   Not on file  Tobacco Use   Smoking status: Never   Smokeless tobacco: Never  Vaping Use   Vaping Use: Never used  Substance and Sexual Activity   Alcohol use: No   Drug use: No   Sexual activity: Not Currently  Other Topics Concern   Not on file  Social History Narrative   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     Has 4 daughters and 3 living sons (one son died).    Uses walker.     Divorced at age 89  Social Determinants of Health   Financial Resource Strain: Not on file  Food Insecurity: Not on file  Transportation Needs: Not on file  Physical Activity: Not on file  Stress: Not on file  Social Connections: Not on file      Review of Systems currently denies fever, headache, chest pain, dyspnea, cough, abdominal/back pain, nausea, vomiting or bleeding.  History of weight loss as noted above  Vital Signs: BP (!) 164/51   Pulse 65   Resp 16      Physical Exam: Awake, answering simple questions okay.  Chest clear to auscultation bilaterally.  Heart with regular rate and rhythm.  Abdomen soft, positive bowel sounds, nontender.  Some trace pretibial edema primarily left-sided  Imaging: DG Chest 2 View  Result Date: 10/10/2021 CLINICAL DATA:  Chest pain, shortness of breath EXAM: CHEST - 2 VIEW COMPARISON:   Radiograph 06/12/2020 FINDINGS: Unchanged cardiomediastinal silhouette. There is no focal airspace consolidation. There is no pleural effusion. No pneumothorax. There is no acute osseous abnormality. Thoracic spondylosis. IMPRESSION: No evidence of acute cardiopulmonary disease. Electronically Signed   By: JMaurine SimmeringM.D.   On: 10/10/2021 16:50    Labs:  CBC: Recent Labs    09/12/21 1015 10/10/21 1605  WBC 4.7 5.2  HGB 11.6* 10.7*  HCT 36.3 33.6*  PLT 241 251    COAGS: No results for input(s): "INR", "APTT" in the last 8760 hours.  BMP: Recent Labs    06/29/21 1450 07/24/21 1514 09/12/21 1015 10/10/21 1605  NA 142 138 142 138  K 4.9 5.1 5.0 3.9  CL 103 102 103 104  CO2 27  $'27 31 28  'D$ GLUCOSE 99 74 111* 106*  BUN '20 22 20 20  '$ CALCIUM 10.0 10.0 10.5* 9.7  CREATININE 0.92 0.91 0.95 0.94  GFRNONAA  --   --  57* 57*    LIVER FUNCTION TESTS: Recent Labs    06/29/21 1450 09/12/21 1015 10/10/21 1717  BILITOT 0.4 0.4 0.3  AST '20 23 23  '$ ALT '12 12 14  '$ ALKPHOS  --  57 51  PROT 6.7 7.5 6.4*  ALBUMIN  --  4.3 3.4*    TUMOR MARKERS: No results for input(s): "AFPTM", "CEA", "CA199", "CHROMGRNA" in the last 8760 hours.  Assessment and Plan: 88 y.o. female with past medical history of arthritis, CHF, asthma, diabetes, hypertension who presents now with history of weight loss and recent imaging revealing:  1. 7 cm mass in the left lobe of the liver has increased in size compared to the prior exam (previously 4.1 cm). This was previously characterized on MRI is indeterminate, possibly atypical hemangioma. Given interval growth, a follow-up MRI of the abdomen is recommended for further evaluation. 2. Minimal patchy ground-glass and interstitial opacities in the right upper lobe, likely infectious/inflammatory. 3. Subcentimeter incidental left thyroid nodule. No follow-up imaging recommended. 4.  Aortic Atherosclerosis  She presents today for image guided liver mass biopsy  for further evaluation.  She has a normal AFP and slightly elevated CEA and CA 19-9 levels.Risks and benefits of procedure was discussed with the patient /daughter Morley Kos including, but not limited to bleeding, infection, damage to adjacent structures or low yield requiring additional tests.  All of the questions were answered and there is agreement to proceed.  Consent signed and in chart.    Thank you for this interesting consult.  I greatly enjoyed meeting Scherry R Bjelland and look forward to participating in their care.  A copy of this report was sent to the requesting provider on this date.  Electronically Signed: D. Rowe Robert, PA-C 10/12/2021, 8:02 AM   I spent a total of    25 Minutes in face to face in clinical consultation, greater than 50% of which was counseling/coordinating care for image guided liver mass biopsy

## 2021-10-12 NOTE — Discharge Instructions (Addendum)
For questions or concerns you can call Interventional Radiology at [phone]  No shower for 24hours.  Liver Biopsy, Care After After a liver biopsy, it is common to have these things in the area where the biopsy was done. You may: Have pain. Feel sore. Have bruising. You may also feel tired for a few days. Follow these instructions at home: Medicines Take over-the-counter and prescription medicines only as told by your doctor. If you were prescribed an antibiotic medicine, take it as told by your doctor. Do not stop taking the antibiotic, even if you start to feel better. Do not take medicines that may thin your blood. These medicines include aspirin and ibuprofen. Take them only if your doctor tells you to. If told, take steps to prevent problems with pooping (constipation). You may need to: Drink enough fluid to keep your pee (urine) pale yellow. Take medicines. You will be told what medicines to take. Eat foods that are high in fiber. These include beans, whole grains, and fresh fruits and vegetables. Limit foods that are high in fat and sugar. These include fried or sweet foods. Ask your doctor if you should avoid driving or using machines while you are taking your medicine. Caring for your incision Follow instructions from your doctor about how to take care of your cut from surgery (incisions). Make sure you: Wash your hands with soap and water for at least 20 seconds before and after you change your bandage. If you cannot use soap and water, use hand sanitizer. Change your bandage. Leavestitches or skin glue in place for at least two weeks. Leave tape strips alone unless you are told to take them off. You may trim the edges of the tape strips if they curl up. Check your incision every day for signs of infection. Check for: Redness, swelling, or more pain. Fluid or blood. Warmth. Pus or a bad smell. Do not take baths, swim, or use a hot tub. Ask your doctor about taking  showers or sponge baths. Activity Rest at home for 1-2 days, or as told by your doctor. Get up to take short walks every 1 to 2 hours. Ask for help if you feel weak or unsteady. Do not lift anything that is heavier than 10 lb (4.5 kg), or the limit that you are told. Do not play contact sports for 2 weeks after the procedure. Return to your normal activities as told by your doctor. Ask what activities are safe for you. General instructions Do not drink alcohol in the first week after the procedure. Plan to have a responsible adult care for you for the time you are told after you leave the hospital or clinic. This is important. It is up to you to get the results of your procedure. Ask how to get your results when they are ready. Keep all follow-up visits.   Contact a doctor if: You have more bleeding in your incision. Your incision swells, or is red and more painful. You have fluid that comes from your incision. You develop a rash. You have fever or chills. Get help right away if: You have swelling, bloating, or pain in your belly (abdomen). You get dizzy or faint. You vomit or you feel like vomiting. You have trouble breathing or feel short of breath. You have chest pain. You have problems talking or seeing. You have trouble with your balance or moving your arms or legs. These symptoms may be an emergency. Get help right away. Call your local emergency services (  911 in the [place] Do not wait to see if the symptoms will go away. Do not drive yourself to the hospital. Summary After the procedure, it is common to have pain, soreness, bruising, and tiredness. Your doctor will tell you how to take care of yourself at home. Change your bandage, take your medicines, and limit your activities as told by your doctor. Call your doctor if you have symptoms of infection. Get help right away if your belly swells, your cut bleeds a lot, or you have trouble talking or breathing. This information  is not intended to replace advice given to you by your health care provider. Make sure you discuss any questions you have with your healthcare provider. Document Revised: 02/14/2020 Document Reviewed: 02/14/2020 Elsevier Patient Education  2022 [place].           Moderate Conscious Sedation, Adult, Care   This sheet gives you information about how to care for yourself after your procedure. Your health care provider may also give you more specific instructions. If you have problems or questions, contact your health care provider. What can I expect after the procedure? After the procedure, it is common to have: Sleepiness for several hours. Impaired judgment for several hours. Difficulty with balance. Vomiting if you eat too soon. Follow these instructions at home: For the time period you were told by your health care provider:    Rest. Do not participate in activities where you could fall or become injured. Do not drive or use machinery. Do not drink alcohol. Do not take sleeping pills or medicines that cause drowsiness. Do not make important decisions or sign legal documents. Do not take care of children on your own. Eating and drinking  Follow the diet recommended by your health care provider. Drink enough fluid to keep your urine pale yellow. If you vomit: Drink water, juice, or soup when you can drink without vomiting. Make sure you have little or no nausea before eating solid foods. General instructions Take over-the-counter and prescription medicines only as told by your health care provider. Have a responsible adult stay with you for the time you are told. It is important to have someone help care for you until you are awake and alert. Do not smoke. Keep all follow-up visits as told by your health care provider. This is important. Contact a health care provider if: You are still sleepy or having trouble with balance after 24 hours. You feel light-headed. You  keep feeling nauseous or you keep vomiting. You develop a rash. You have a fever. You have redness or swelling around the IV site. Get help right away if: You have trouble breathing. You have new-onset confusion at home. Summary After the procedure, it is common to feel sleepy, have impaired judgment, or feel nauseous if you eat too soon. Rest after you get home. Know the things you should not do after the procedure. Follow the diet recommended by your health care provider and drink enough fluid to keep your urine pale yellow. Get help right away if you have trouble breathing or new-onset confusion at home. This information is not intended to replace advice given to you by your health care provider. Make sure you discuss any questions you have with your health care provider. Document Revised: 07/30/2019 Document Reviewed: 02/25/2019 Elsevier Patient Education  [place].

## 2021-10-19 ENCOUNTER — Encounter: Payer: Self-pay | Admitting: *Deleted

## 2021-10-19 LAB — SURGICAL PATHOLOGY

## 2021-10-19 NOTE — Progress Notes (Signed)
Per Dr Marin Olp, request for [facility] Testing sent on specimen [id number] DOS 10/12/2021  Dr Marin Olp wants to see patient next week. Scheduled for 10/22/2021.  Oncology Nurse Navigator Documentation     10/19/2021   12:00 PM  Oncology Nurse Navigator Flowsheets  Navigator Location [facility]  Navigator Encounter Type Molecular Studies  Patient Visit Type MedOnc  Treatment Phase Pre-Tx/Tx Discussion  Barriers/Navigation Needs Coordination of Care;Education  Interventions Coordination of Care  Acuity Level 2-Minimal Needs (1-2 Barriers Identified)  Coordination of Care Pathology  Support Groups/Services Friends and Family  Time Spent with Patient 30

## 2021-10-22 ENCOUNTER — Other Ambulatory Visit: Payer: Self-pay

## 2021-10-22 ENCOUNTER — Other Ambulatory Visit: Payer: Self-pay | Admitting: *Deleted

## 2021-10-22 ENCOUNTER — Encounter: Payer: Self-pay | Admitting: Hematology & Oncology

## 2021-10-22 ENCOUNTER — Inpatient Hospital Stay: Payer: Medicare Other | Attending: Hematology & Oncology

## 2021-10-22 ENCOUNTER — Inpatient Hospital Stay (HOSPITAL_BASED_OUTPATIENT_CLINIC_OR_DEPARTMENT_OTHER): Payer: Medicare Other | Admitting: Hematology & Oncology

## 2021-10-22 DIAGNOSIS — R160 Hepatomegaly, not elsewhere classified: Secondary | ICD-10-CM

## 2021-10-22 DIAGNOSIS — Z5112 Encounter for antineoplastic immunotherapy: Secondary | ICD-10-CM | POA: Diagnosis not present

## 2021-10-22 DIAGNOSIS — Z5111 Encounter for antineoplastic chemotherapy: Secondary | ICD-10-CM | POA: Insufficient documentation

## 2021-10-22 DIAGNOSIS — C787 Secondary malignant neoplasm of liver and intrahepatic bile duct: Secondary | ICD-10-CM

## 2021-10-22 DIAGNOSIS — C801 Malignant (primary) neoplasm, unspecified: Secondary | ICD-10-CM | POA: Insufficient documentation

## 2021-10-22 DIAGNOSIS — C259 Malignant neoplasm of pancreas, unspecified: Secondary | ICD-10-CM | POA: Insufficient documentation

## 2021-10-22 DIAGNOSIS — R970 Elevated carcinoembryonic antigen [CEA]: Secondary | ICD-10-CM | POA: Insufficient documentation

## 2021-10-22 DIAGNOSIS — R978 Other abnormal tumor markers: Secondary | ICD-10-CM | POA: Insufficient documentation

## 2021-10-22 DIAGNOSIS — Z7189 Other specified counseling: Secondary | ICD-10-CM | POA: Diagnosis not present

## 2021-10-22 HISTORY — DX: Other specified counseling: Z71.89

## 2021-10-22 HISTORY — DX: Malignant neoplasm of pancreas, unspecified: C25.9

## 2021-10-22 LAB — COMPREHENSIVE METABOLIC PANEL
ALT: 11 U/L (ref 0–44)
AST: 22 U/L (ref 15–41)
Albumin: 4.1 g/dL (ref 3.5–5.0)
Alkaline Phosphatase: 54 U/L (ref 38–126)
Anion gap: 8 (ref 5–15)
BUN: 23 mg/dL (ref 8–23)
CO2: 28 mmol/L (ref 22–32)
Calcium: 9.8 mg/dL (ref 8.9–10.3)
Chloride: 100 mmol/L (ref 98–111)
Creatinine, Ser: 1.02 mg/dL — ABNORMAL HIGH (ref 0.44–1.00)
GFR, Estimated: 52 mL/min — ABNORMAL LOW (ref >60)
Glucose, Bld: 154 mg/dL — ABNORMAL HIGH (ref 70–99)
Potassium: 5 mmol/L (ref 3.5–5.1)
Sodium: 136 mmol/L (ref 135–145)
Total Bilirubin: 0.4 mg/dL (ref 0.3–1.2)
Total Protein: 6.7 g/dL (ref 6.5–8.1)

## 2021-10-22 LAB — CBC WITH DIFFERENTIAL (CANCER CENTER ONLY)
Abs Immature Granulocytes: 0.02 10*3/uL (ref 0.00–0.07)
Basophils Absolute: 0 10*3/uL (ref 0.0–0.1)
Basophils Relative: 1 %
Eosinophils Absolute: 0.1 10*3/uL (ref 0.0–0.5)
Eosinophils Relative: 3 %
HCT: 33.8 % — ABNORMAL LOW (ref 36.0–46.0)
Hemoglobin: 10.6 g/dL — ABNORMAL LOW (ref 12.0–15.0)
Immature Granulocytes: 0 %
Lymphocytes Relative: 33 %
Lymphs Abs: 1.6 10*3/uL (ref 0.7–4.0)
MCH: 30.1 pg (ref 26.0–34.0)
MCHC: 31.4 g/dL (ref 30.0–36.0)
MCV: 96 fL (ref 80.0–100.0)
Monocytes Absolute: 0.4 10*3/uL (ref 0.1–1.0)
Monocytes Relative: 9 %
Neutro Abs: 2.5 10*3/uL (ref 1.7–7.7)
Neutrophils Relative %: 54 %
Platelet Count: 273 10*3/uL (ref 150–400)
RBC: 3.52 MIL/uL — ABNORMAL LOW (ref 3.87–5.11)
RDW: 14.2 % (ref 11.5–15.5)
WBC Count: 4.6 10*3/uL (ref 4.0–10.5)
nRBC: 0 % (ref 0.0–0.2)

## 2021-10-22 LAB — LACTATE DEHYDROGENASE: LDH: 153 U/L (ref 98–192)

## 2021-10-22 LAB — PREALBUMIN: Prealbumin: 18.5 mg/dL (ref 18–38)

## 2021-10-22 LAB — CEA (IN HOUSE-CHCC): CEA (CHCC-In House): 5.01 ng/mL — ABNORMAL HIGH (ref 0.00–5.00)

## 2021-10-22 NOTE — Progress Notes (Signed)
START ON PATHWAY REGIMEN - Pancreatic Adenocarcinoma     A cycle is every 28 days:     Gemcitabine   **Always confirm dose/schedule in your pharmacy ordering system**  Patient Characteristics: Metastatic Disease, First Line, PS  ?  2, BRCA1/2 and PALB2 Mutation Absent/Unknown Therapeutic Status: Metastatic Disease Line of Therapy: First Line ECOG Performance Status: 2 BRCA1/2 Mutation Status: Awaiting Test Results PALB2 Mutation Status: Awaiting Test Results Intent of Therapy: Non-Curative / Palliative Intent, Discussed with Patient

## 2021-10-22 NOTE — Progress Notes (Signed)
Hematology and Oncology Follow Up Visit  Angelica Benson [id number] 1928-12-11 88 y.o. 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  We did get a biopsy on her.  This was a liver biopsy.  When we first saw her back in June, her work-up included tumor markers.  Her alpha-fetoprotein was normal at less than 1.8.  Her CA 19/9 was elevated at 63 and the CEA was slightly elevated at 5.3.  We went ahead and got a liver biopsy.  This was done on 10/12/2021.  The pathology report [id number]) showed a poorly differentiated carcinoma that appeared to be adenocarcinoma with pancreaticobiliary or upper GI in origin.  Her work-up really did not show any obvious malignancy that was a primary.  It certainly seems as if this was probably pancreatic or biliary.  She is doing okay.  She is lost a little bit of weight.  Hopefully, she will be able to eat a little bit better.  Her weight is 131 pounds.  She did have a good prealbumin of 19.3.  I know she is 88 years old.  However, she certainly is quite spunky.  She has a decent performance status.  I was able to perform status is probably ECOG 2.  I do think that she would be a candidate for systemic therapy.  We did send off the liver biopsy for molecular markers.  I would think that they probably would be more than likely not helpful.  I do think that we could try her on gemcitabine/Durvalumab.  I think this would be a reasonable protocol for her given the likely origin of her malignancy.  I think she is going need to have a Port-A-Cath placed.  Again I realize her age.  However, I just do not think that she has good peripheral access.  She has had no cough or shortness of breath.  She has had no obvious change in bowel or bladder habits.  There is  been no leg swelling.  She has had no bleeding.  She has had no fever.  Currently, I would say that her performance status is ECOG 2.    Medications:  Current Outpatient Medications:    Accu-Chek FastClix Lancets MISC, USE AS DIRECTED TO CHECK  BLOOD SUGAR, Disp: 102 each, Rfl: 2   acetaminophen (TYLENOL) 500 MG tablet, Take 2 tablets (1,000 mg total) by mouth 2 (two) times daily as needed for moderate pain., Disp: 30 tablet, Rfl: 0   albuterol (VENTOLIN HFA) 108 (90 Base) MCG/ACT inhaler, USE 2 INHALATIONS BY MOUTH  EVERY 6 HOURS AS NEEDED FOR WHEEZING OR SHORTNESS OF  BREATH, Disp: 8 g, Rfl: 0   aspirin EC 81 MG tablet, Take 81 mg by mouth daily., Disp: , Rfl:    atorvastatin (LIPITOR) 10 MG tablet, TAKE 1 TABLET BY MOUTH  DAILY, Disp: 100 tablet, Rfl: 2   betamethasone dipropionate 0.05 % cream, Apply topically 2 (two) times daily., Disp: 30 g, Rfl: 0   escitalopram (LEXAPRO) 5 MG tablet, TAKE 1 TABLET(5 MG) BY MOUTH DAILY, Disp: 30 tablet, Rfl: 1   fluticasone (FLONASE) 50 MCG/ACT nasal spray, USE 2 SPRAYS IN BOTH  NOSTRILS DAILY, Disp: 48 g, Rfl: 3   glucose blood (ACCU-CHEK GUIDE) test strip, USE ONE TEST STRIP AS  INSTRUCTED, Disp: 100 strip, Rfl: 3   lisinopril (  ZESTRIL) 10 MG tablet, TAKE 1 TABLET BY MOUTH DAILY, Disp: 100 tablet, Rfl: 2   metFORMIN (GLUCOPHAGE) 500 MG tablet, TAKE 1 TABLET BY MOUTH TWICE  DAILY WITH A MEAL, Disp: 180 tablet, Rfl: 3   mometasone-formoterol (DULERA) 200-5 MCG/ACT AERO, Inhale 1 puff into the lungs 2 (two) times daily., Disp: 13 g, Rfl: 5   Multiple Vitamins-Minerals (CENTRUM SILVER 50+WOMEN) TABS, Take 1 tablet by mouth daily., Disp: , Rfl:    omeprazole (PRILOSEC) 20 MG capsule, TAKE 1 CAPSULE BY MOUTH DAILY, Disp: 30 capsule, Rfl: 11   nitroGLYCERIN (NITROSTAT) 0.4 MG SL tablet, DISSOLVE 1 TABLET UNDER THE TONGUE EVERY 5 MINUTES AS  NEEDED FOR CHEST PAIN. MAX  OF 3 TABLETS IN 15 MINUTES. CALL 911 IF PAIN PERSISTS. (Patient not taking: Reported on  09/12/2021), Disp: 25 tablet, Rfl: 1  Allergies:  Allergies  Allergen Reactions   Buprenorphine Hcl Rash   Morphine And Related Rash    Past Medical History, Surgical history, Social history, and Family History were reviewed and updated.  Review of Systems: Review of Systems  Constitutional:  Positive for fatigue and unexpected weight change.  HENT:  Negative.    Eyes: Negative.   Respiratory: Negative.    Cardiovascular: Negative.   Gastrointestinal:  Positive for abdominal pain.  Endocrine: Negative.   Musculoskeletal: Negative.   Skin: Negative.   Neurological: Negative.   Hematological: Negative.   Psychiatric/Behavioral: Negative.      Physical Exam:  height is '5\' 1"'$  (1.549 m) and weight is 131 lb 9.6 oz (59.7 kg). Her oral temperature is 97.8 F (36.6 C). Her blood pressure is 158/51 (abnormal) and her pulse is 67. Her respiration is 18 and oxygen saturation is 100%.   Wt Readings from Last 3 Encounters:  10/22/21 131 lb 9.6 oz (59.7 kg)  09/12/21 130 lb 0.6 oz (59 kg)  09/07/21 132 lb (59.9 kg)    Physical Exam Vitals reviewed.  HENT:     Head: Normocephalic and atraumatic.  Eyes:     Pupils: Pupils are equal, round, and reactive to light.  Cardiovascular:     Rate and Rhythm: Normal rate and regular rhythm.     Heart sounds: Normal heart sounds.  Pulmonary:     Effort: Pulmonary effort is normal.     Breath sounds: Normal breath sounds.  Abdominal:     General: Bowel sounds are normal.     Palpations: Abdomen is soft.  Musculoskeletal:        General: No tenderness or deformity. Normal range of motion.     Cervical back: Normal range of motion.  Lymphadenopathy:     Cervical: No cervical adenopathy.  Skin:    General: Skin is warm and dry.     Findings: No erythema or rash.  Neurological:     Mental Status: She is alert and oriented to person, place, and time.  Psychiatric:        Behavior: Behavior normal.        Thought Content: Thought content  normal.        Judgment: Judgment normal.      Lab Results  Component Value Date   WBC 4.6 10/22/2021   HGB 10.6 (L) 10/22/2021   HCT 33.8 (L) 10/22/2021   MCV 96.0 10/22/2021   PLT 273 10/22/2021     Chemistry      Component Value Date/Time   NA 136 10/22/2021 1115   K 5.0 10/22/2021 1115   CL 100 10/22/2021 1115  CO2 28 10/22/2021 1115   BUN 23 10/22/2021 1115   CREATININE 1.02 (H) 10/22/2021 1115   CREATININE 0.95 09/12/2021 1015   CREATININE 0.92 06/29/2021 1450      Component Value Date/Time   CALCIUM 9.8 10/22/2021 1115   ALKPHOS 54 10/22/2021 1115   AST 22 10/22/2021 1115   AST 23 09/12/2021 1015   ALT 11 10/22/2021 1115   ALT 12 09/12/2021 1015   BILITOT 0.4 10/22/2021 1115   BILITOT 0.4 09/12/2021 1015      Impression and Plan: Ms. Mcloughlin is a very charming 88 year old African-American female.  She has a poorly differentiated adenocarcinoma.  Again I suspect that this is can be a pancreaticobiliary primary.  Again we really cannot be all that aggressive because of her age and performance status.  However, I do think that we do have a chance of trying to help this and trying to shrink it down a little bit.  I think this would help her quality of life and quantity of life.  I think that gemcitabine/Durvalumab would be a very reasonable protocol for her.  I think that with biliary tract cancer, there is good evidence that immunotherapy does help with chemotherapy.  I just do not think she will be a candidate for combination chemotherapy along with immunotherapy.  I think we use single agent gemcitabine with Durvalumab, at this would be a reasonable protocol that would be effective.  I spent about 45 minutes with she and her daughter.  I explained my recommendation.  I explained that a Port-A-Cath would really help her out.  I explained what a Port-A-Cath is.  She is willing to try treatment.  As such, we will see about getting started next week.  I  probably would give her 2 or 3 cycles of treatment.  I probably would do 2 weeks on and 1 week off of gemcitabine.  I think the Durvalumab will be once every 3 weeks.  We can use a tumor markers to assess for response.  I will plan to see her back when she starts her second cycle of treatment.   Volanda Napoleon, MD 7/10/20235:09 PM

## 2021-10-24 ENCOUNTER — Encounter: Payer: Self-pay | Admitting: *Deleted

## 2021-10-24 NOTE — Progress Notes (Signed)
Patient was seen this past Monday and a plan for treatment was made. Patient will receive gemzar and will have a port placed. Dr Marin Olp wants to start treatment on 11/01/21 however port placement isn't possible until 11/05/2021. Spoke to clinic director and first dose can be given peripherally as to not delay initiation of treatment.   Patient will also need chemo education. Contacted her daughter and education will be arranged.   Oncology Nurse Navigator Documentation     10/24/2021    1:30 PM  Oncology Nurse Navigator Flowsheets  Abnormal Finding Date 08/16/2021  Confirmed Diagnosis Date 10/12/2021  Diagnosis Status Confirmed Diagnosis Complete  Phase of Treatment Chemo  Chemotherapy Pending- Reason: Oncologist Choice  Navigator Follow Up Date: 11/01/2021  Navigator Follow Up Reason: Chemotherapy  Navigator Location [facility]  Navigator Encounter Type Appt/Treatment Plan Review  Patient Visit Type MedOnc  Treatment Phase Pre-Tx/Tx Discussion  Barriers/Navigation Needs Coordination of Care;Education  Interventions Other  Acuity Level 2-Minimal Needs (1-2 Barriers Identified)  Coordination of Care Other  Support Groups/Services Friends and Family  Time Spent with Patient 30

## 2021-10-29 ENCOUNTER — Encounter: Payer: Self-pay | Admitting: *Deleted

## 2021-10-29 ENCOUNTER — Inpatient Hospital Stay: Payer: Medicare Other

## 2021-10-29 ENCOUNTER — Other Ambulatory Visit: Payer: Self-pay | Admitting: *Deleted

## 2021-10-29 DIAGNOSIS — C259 Malignant neoplasm of pancreas, unspecified: Secondary | ICD-10-CM

## 2021-10-29 MED ORDER — LIDOCAINE-PRILOCAINE 2.5-2.5 % EX CREA: TOPICAL_CREAM | CUTANEOUS | 3 refills | Status: DC

## 2021-10-29 MED ORDER — ONDANSETRON HCL 8 MG PO TABS: 8.0000 mg | ORAL_TABLET | Freq: Two times a day (BID) | ORAL | 1 refills | Status: DC | PRN

## 2021-10-29 MED ORDER — PROCHLORPERAZINE MALEATE 10 MG PO TABS: 10.0000 mg | ORAL_TABLET | Freq: Four times a day (QID) | ORAL | 1 refills | Status: DC | PRN

## 2021-10-29 NOTE — Progress Notes (Signed)
Patient in chemotherapy education class with daughter, nephew and niece.  Discussed side effects of Gemzar and Durvalumab which include but are not limited to myelosuppression, decreased appetite, fatigue, fever, allergic or infusional reaction, mucositis, cardiac toxicity, cough, SOB, altered taste, nausea and vomiting, diarrhea, constipation, elevated LFTs myalgia and arthralgias, hair loss or thinning, rash, skin dryness, nail changes, peripheral neuropathy, discolored urine, delayed wound healing, mental changes (Chemo brain), increased risk of infections, weight loss.  Reviewed infusion room and office policy and procedure and phone numbers 24 hours x 7 days a week.  Reviewed when to call the office with any concerns or problems.  Scientist, clinical (histocompatibility and immunogenetics) given.  Discussed portacath insertion and EMLA cream administration.  Antiemetic protocol and chemotherapy schedule reviewed. Patient verbalized understanding of chemotherapy indications and possible side effects.  Teachback done

## 2021-10-30 ENCOUNTER — Other Ambulatory Visit: Payer: Self-pay | Admitting: *Deleted

## 2021-10-30 DIAGNOSIS — R160 Hepatomegaly, not elsewhere classified: Secondary | ICD-10-CM

## 2021-10-30 DIAGNOSIS — Z7189 Other specified counseling: Secondary | ICD-10-CM

## 2021-10-30 DIAGNOSIS — C259 Malignant neoplasm of pancreas, unspecified: Secondary | ICD-10-CM

## 2021-11-01 ENCOUNTER — Inpatient Hospital Stay: Payer: Medicare Other

## 2021-11-01 VITALS — BP 144/46 | HR 57 | Temp 97.9°F | Resp 17

## 2021-11-01 DIAGNOSIS — Z5112 Encounter for antineoplastic immunotherapy: Secondary | ICD-10-CM | POA: Diagnosis not present

## 2021-11-01 DIAGNOSIS — C787 Secondary malignant neoplasm of liver and intrahepatic bile duct: Secondary | ICD-10-CM | POA: Diagnosis not present

## 2021-11-01 DIAGNOSIS — Z5111 Encounter for antineoplastic chemotherapy: Secondary | ICD-10-CM | POA: Diagnosis not present

## 2021-11-01 DIAGNOSIS — R978 Other abnormal tumor markers: Secondary | ICD-10-CM | POA: Diagnosis not present

## 2021-11-01 DIAGNOSIS — C801 Malignant (primary) neoplasm, unspecified: Secondary | ICD-10-CM | POA: Diagnosis not present

## 2021-11-01 DIAGNOSIS — R970 Elevated carcinoembryonic antigen [CEA]: Secondary | ICD-10-CM | POA: Diagnosis not present

## 2021-11-01 LAB — CMP (CANCER CENTER ONLY)
ALT: 12 U/L (ref 0–44)
AST: 21 U/L (ref 15–41)
Albumin: 4.3 g/dL (ref 3.5–5.0)
Alkaline Phosphatase: 56 U/L (ref 38–126)
Anion gap: 9 (ref 5–15)
BUN: 24 mg/dL — ABNORMAL HIGH (ref 8–23)
CO2: 28 mmol/L (ref 22–32)
Calcium: 9.9 mg/dL (ref 8.9–10.3)
Chloride: 101 mmol/L (ref 98–111)
Creatinine: 0.99 mg/dL (ref 0.44–1.00)
GFR, Estimated: 54 mL/min — ABNORMAL LOW (ref >60)
Glucose, Bld: 115 mg/dL — ABNORMAL HIGH (ref 70–99)
Potassium: 4.4 mmol/L (ref 3.5–5.1)
Sodium: 138 mmol/L (ref 135–145)
Total Bilirubin: 0.4 mg/dL (ref 0.3–1.2)
Total Protein: 6.9 g/dL (ref 6.5–8.1)

## 2021-11-01 LAB — CBC WITH DIFFERENTIAL (CANCER CENTER ONLY)
Abs Immature Granulocytes: 0.01 10*3/uL (ref 0.00–0.07)
Basophils Absolute: 0 10*3/uL (ref 0.0–0.1)
Basophils Relative: 1 %
Eosinophils Absolute: 0.1 10*3/uL (ref 0.0–0.5)
Eosinophils Relative: 3 %
HCT: 33.4 % — ABNORMAL LOW (ref 36.0–46.0)
Hemoglobin: 10.6 g/dL — ABNORMAL LOW (ref 12.0–15.0)
Immature Granulocytes: 0 %
Lymphocytes Relative: 36 %
Lymphs Abs: 1.6 10*3/uL (ref 0.7–4.0)
MCH: 30.1 pg (ref 26.0–34.0)
MCHC: 31.7 g/dL (ref 30.0–36.0)
MCV: 94.9 fL (ref 80.0–100.0)
Monocytes Absolute: 0.4 10*3/uL (ref 0.1–1.0)
Monocytes Relative: 9 %
Neutro Abs: 2.2 10*3/uL (ref 1.7–7.7)
Neutrophils Relative %: 51 %
Platelet Count: 232 10*3/uL (ref 150–400)
RBC: 3.52 MIL/uL — ABNORMAL LOW (ref 3.87–5.11)
RDW: 14.3 % (ref 11.5–15.5)
WBC Count: 4.3 10*3/uL (ref 4.0–10.5)
nRBC: 0 % (ref 0.0–0.2)

## 2021-11-01 MED ORDER — SODIUM CHLORIDE 0.9 % IV SOLN
1000.0000 mg/m2 | Freq: Once | INTRAVENOUS | Status: AC
  Administered 2021-11-01: 1596 mg via INTRAVENOUS
  Filled 2021-11-01: qty 26.3

## 2021-11-01 MED ORDER — SODIUM CHLORIDE 0.9 % IV SOLN: Freq: Once | INTRAVENOUS | Status: AC

## 2021-11-01 MED ORDER — PROCHLORPERAZINE MALEATE 10 MG PO TABS
10.0000 mg | ORAL_TABLET | Freq: Once | ORAL | Status: AC
  Administered 2021-11-01: 10 mg via ORAL
  Filled 2021-11-01: qty 1

## 2021-11-01 MED ORDER — SODIUM CHLORIDE 0.9 % IV SOLN
1500.0000 mg | Freq: Once | INTRAVENOUS | Status: AC
  Administered 2021-11-01: 1500 mg via INTRAVENOUS
  Filled 2021-11-01: qty 30

## 2021-11-01 NOTE — Patient Instructions (Signed)
Gemcitabine injection What is this medication? GEMCITABINE (jem SYE ta been) is a chemotherapy drug. This medicine is used to treat many types of cancer like breast cancer, lung cancer, pancreatic cancer, and ovarian cancer. This medicine may be used for other purposes; ask your health care provider or pharmacist if you have questions. COMMON BRAND NAME(S): Gemzar, Infugem What should I tell my care team before I take this medication? They need to know if you have any of these conditions: blood disorders infection kidney disease liver disease lung or breathing disease, like asthma recent or ongoing radiation therapy an unusual or allergic reaction to gemcitabine, other chemotherapy, other medicines, foods, dyes, or preservatives pregnant or trying to get pregnant breast-feeding How should I use this medication? This drug is given as an infusion into a vein. It is administered in a hospital or clinic by a specially trained health care professional. Talk to your pediatrician regarding the use of this medicine in children. Special care may be needed. Overdosage: If you think you have taken too much of this medicine contact a poison control center or emergency room at once. NOTE: This medicine is only for you. Do not share this medicine with others. What if I miss a dose? It is important not to miss your dose. Call your doctor or health care professional if you are unable to keep an appointment. What may interact with this medication? medicines to increase blood counts like filgrastim, pegfilgrastim, sargramostim some other chemotherapy drugs like cisplatin vaccines Talk to your doctor or health care professional before taking any of these medicines: acetaminophen aspirin ibuprofen ketoprofen naproxen This list may not describe all possible interactions. Give your health care provider a list of all the medicines, herbs, non-prescription drugs, or dietary supplements you use. Also tell  them if you smoke, drink alcohol, or use illegal drugs. Some items may interact with your medicine. What should I watch for while using this medication? Visit your doctor for checks on your progress. This drug may make you feel generally unwell. This is not uncommon, as chemotherapy can affect healthy cells as well as cancer cells. Report any side effects. Continue your course of treatment even though you feel ill unless your doctor tells you to stop. In some cases, you may be given additional medicines to help with side effects. Follow all directions for their use. Call your doctor or health care professional for advice if you get a fever, chills or sore throat, or other symptoms of a cold or flu. Do not treat yourself. This drug decreases your body's ability to fight infections. Try to avoid being around people who are sick. This medicine may increase your risk to bruise or bleed. Call your doctor or health care professional if you notice any unusual bleeding. Be careful brushing and flossing your teeth or using a toothpick because you may get an infection or bleed more easily. If you have any dental work done, tell your dentist you are receiving this medicine. Avoid taking products that contain aspirin, acetaminophen, ibuprofen, naproxen, or ketoprofen unless instructed by your doctor. These medicines may hide a fever. Do not become pregnant while taking this medicine or for 6 months after stopping it. Women should inform their doctor if they wish to become pregnant or think they might be pregnant. Men should not father a child while taking this medicine and for 3 months after stopping it. There is a potential for serious side effects to an unborn child. Talk to your health care professional  or pharmacist for more information. Do not breast-feed an infant while taking this medicine or for at least 1 week after stopping it. Men should inform their doctors if they wish to father a child. This medicine may  lower sperm counts. Talk with your doctor or health care professional if you are concerned about your fertility. What side effects may I notice from receiving this medication? Side effects that you should report to your doctor or health care professional as soon as possible: allergic reactions like skin rash, itching or hives, swelling of the face, lips, or tongue breathing problems pain, redness, or irritation at site where injected signs and symptoms of a dangerous change in heartbeat or heart rhythm like chest pain; dizziness; fast or irregular heartbeat; palpitations; feeling faint or lightheaded, falls; breathing problems signs of decreased platelets or bleeding - bruising, pinpoint red spots on the skin, black, tarry stools, blood in the urine signs of decreased red blood cells - unusually weak or tired, feeling faint or lightheaded, falls signs of infection - fever or chills, cough, sore throat, pain or difficulty passing urine signs and symptoms of kidney injury like trouble passing urine or change in the amount of urine signs and symptoms of liver injury like dark yellow or brown urine; general ill feeling or flu-like symptoms; light-colored stools; loss of appetite; nausea; right upper belly pain; unusually weak or tired; yellowing of the eyes or skin swelling of ankles, feet, hands Side effects that usually do not require medical attention (report to your doctor or health care professional if they continue or are bothersome): constipation diarrhea hair loss loss of appetite nausea rash vomiting This list may not describe all possible side effects. Call your doctor for medical advice about side effects. You may report side effects to FDA at 1-800-FDA-1088. Where should I keep my medication? This drug is given in a hospital or clinic and will not be stored at home. NOTE: This sheet is a summary. It may not cover all possible information. If you have questions about this medicine,  talk to your doctor, pharmacist, or health care provider.  2023 Elsevier/Gold Standard (2017-06-25 00:00:00)  Durvalumab injection What is this medication? DURVALUMAB (dur VAL ue mab) is a monoclonal antibody. It is used to treat lung cancer. This medicine may be used for other purposes; ask your health care provider or pharmacist if you have questions. COMMON BRAND NAME(S): IMFINZI What should I tell my care team before I take this medication? They need to know if you have any of these conditions: autoimmune diseases like Crohn's disease, ulcerative colitis, or lupus have had or planning to have an allogeneic stem cell transplant (uses someone else's stem cells) history of organ transplant history of radiation to the chest nervous system problems like myasthenia gravis or Guillain-Barre syndrome an unusual or allergic reaction to durvalumab, other medicines, foods, dyes, or preservatives pregnant or trying to get pregnant breast-feeding How should I use this medication? This medicine is for infusion into a vein. It is given by a health care professional in a hospital or clinic setting. A special MedGuide will be given to you before each treatment. Be sure to read this information carefully each time. Talk to your pediatrician regarding the use of this medicine in children. Special care may be needed. Overdosage: If you think you have taken too much of this medicine contact a poison control center or emergency room at once. NOTE: This medicine is only for you. Do not share this medicine with  others. What if I miss a dose? It is important not to miss your dose. Call your doctor or health care professional if you are unable to keep an appointment. What may interact with this medication? Interactions have not been studied. This list may not describe all possible interactions. Give your health care provider a list of all the medicines, herbs, non-prescription drugs, or dietary supplements  you use. Also tell them if you smoke, drink alcohol, or use illegal drugs. Some items may interact with your medicine. What should I watch for while using this medication? This medication may make you feel generally unwell. Continue your course of treatment even though you feel ill unless your care team tells you to stop. You may need blood work done while you are taking this medication. Do not become pregnant while taking this medication or for 3 months after stopping it. Women should inform their care team if they wish to become pregnant or think they might be pregnant. There is a potential for serious side effects to an unborn child. Talk to your care team or pharmacist for more information. Do not breast-feed an infant while taking this medication or for 3 months after stopping it. What side effects may I notice from receiving this medication? Side effects that you should report to your care team as soon as possible: Allergic reactions--skin rash, itching, hives, swelling of the face, lips, tongue, or throat Bloody or watery diarrhea Dizziness, loss of balance or coordination, confusion or trouble speaking Dry cough, shortness of breath or trouble breathing Flushing, mostly over the face, neck, and chest, during injection High blood sugar (hyperglycemia)--increased thirst or amount of urine, unusual weakness or fatigue, blurry vision High thyroid levels (hyperthyroidism)--fast or irregular heartbeat, weight loss, excessive sweating or sensitivity to heat, tremors or shaking, anxiety, nervousness, irregular menstrual cycle or spotting Infection--fever, chills, cough, or sore throat Liver injury--right upper belly pain, loss of appetite, nausea, light-colored stool, dark yellow or brown urine, yellowing skin or eyes, unusual weakness or fatigue Low adrenal gland function--nausea, vomiting, loss of appetite, unusual weakness or fatigue, dizziness, low blood pressure Low thyroid levels  (hypothyroidism)--unusual weakness or fatigue, increased sensitivity to cold, constipation, hair loss, dry skin, weight gain, feelings of depression Pancreatitis--severe stomach pain that spreads to your back or gets worse after eating or when touched, fever, nausea, vomiting Rash, fever, and swollen lymph nodes Redness, blistering, peeling or loosening of the skin, including inside the mouth Wheezing--trouble breathing with loud or whistling sounds Side effects that usually do not require medical attention (report these to your care team if they continue or are bothersome): Fatigue Hair loss This list may not describe all possible side effects. Call your doctor for medical advice about side effects. You may report side effects to FDA at 1-800-FDA-1088. Where should I keep my medication? This medication is given in a hospital or clinic. It will not be stored at home. NOTE: This sheet is a summary. It may not cover all possible information. If you have questions about this medicine, talk to your doctor, pharmacist, or health care provider.  2023 Elsevier/Gold Standard (2021-03-02 00:00:00)

## 2021-11-02 ENCOUNTER — Other Ambulatory Visit (HOSPITAL_COMMUNITY): Payer: Self-pay | Admitting: Radiology

## 2021-11-02 DIAGNOSIS — C259 Malignant neoplasm of pancreas, unspecified: Secondary | ICD-10-CM

## 2021-11-02 DIAGNOSIS — C801 Malignant (primary) neoplasm, unspecified: Secondary | ICD-10-CM | POA: Diagnosis not present

## 2021-11-05 ENCOUNTER — Other Ambulatory Visit: Payer: Self-pay

## 2021-11-05 ENCOUNTER — Encounter (HOSPITAL_COMMUNITY): Payer: Self-pay | Admitting: Hematology & Oncology

## 2021-11-05 ENCOUNTER — Ambulatory Visit (HOSPITAL_COMMUNITY)
Admission: RE | Admit: 2021-11-05 | Discharge: 2021-11-05 | Disposition: A | Discharge status: Home / Self Care | Payer: Medicare Other | Source: Ambulatory Visit | Attending: Hematology & Oncology | Admitting: Hematology & Oncology

## 2021-11-05 ENCOUNTER — Other Ambulatory Visit: Payer: Self-pay | Admitting: Hematology & Oncology

## 2021-11-05 DIAGNOSIS — C259 Malignant neoplasm of pancreas, unspecified: Secondary | ICD-10-CM

## 2021-11-05 DIAGNOSIS — E119 Type 2 diabetes mellitus without complications: Secondary | ICD-10-CM | POA: Diagnosis not present

## 2021-11-05 DIAGNOSIS — I110 Hypertensive heart disease with heart failure: Secondary | ICD-10-CM | POA: Diagnosis not present

## 2021-11-05 DIAGNOSIS — Z7984 Long term (current) use of oral hypoglycemic drugs: Secondary | ICD-10-CM | POA: Diagnosis not present

## 2021-11-05 DIAGNOSIS — Z452 Encounter for adjustment and management of vascular access device: Secondary | ICD-10-CM | POA: Diagnosis not present

## 2021-11-05 DIAGNOSIS — I509 Heart failure, unspecified: Secondary | ICD-10-CM | POA: Diagnosis not present

## 2021-11-05 DIAGNOSIS — C787 Secondary malignant neoplasm of liver and intrahepatic bile duct: Secondary | ICD-10-CM | POA: Insufficient documentation

## 2021-11-05 HISTORY — PX: IR IMAGING GUIDED PORT INSERTION: IMG5740

## 2021-11-05 LAB — GLUCOSE, CAPILLARY: Glucose-Capillary: 81 mg/dL (ref 70–99)

## 2021-11-05 MED ORDER — HEPARIN SOD (PORK) LOCK FLUSH 100 UNIT/ML IV SOLN
INTRAVENOUS | Status: AC
  Filled 2021-11-05: qty 5

## 2021-11-05 MED ORDER — MIDAZOLAM HCL 2 MG/2ML IJ SOLN
INTRAMUSCULAR | Status: AC | PRN
  Administered 2021-11-05: .5 mg via INTRAVENOUS

## 2021-11-05 MED ORDER — SODIUM CHLORIDE 0.9 % IV SOLN: INTRAVENOUS | Status: DC

## 2021-11-05 MED ORDER — DEXTROSE 5 % IV SOLN: INTRAVENOUS | Status: DC

## 2021-11-05 MED ORDER — FENTANYL CITRATE (PF) 100 MCG/2ML IJ SOLN
INTRAMUSCULAR | Status: AC
  Filled 2021-11-05: qty 2

## 2021-11-05 MED ORDER — LIDOCAINE HCL 1 % IJ SOLN
INTRAMUSCULAR | Status: AC | PRN
  Administered 2021-11-05: 10 mL via INTRADERMAL

## 2021-11-05 MED ORDER — LIDOCAINE HCL 1 % IJ SOLN
INTRAMUSCULAR | Status: AC
  Filled 2021-11-05: qty 20

## 2021-11-05 MED ORDER — FENTANYL CITRATE (PF) 100 MCG/2ML IJ SOLN
INTRAMUSCULAR | Status: AC | PRN
  Administered 2021-11-05: 25 ug via INTRAVENOUS

## 2021-11-05 MED ORDER — MIDAZOLAM HCL 2 MG/2ML IJ SOLN
INTRAMUSCULAR | Status: AC
  Filled 2021-11-05: qty 2

## 2021-11-05 MED ORDER — HEPARIN SOD (PORK) LOCK FLUSH 100 UNIT/ML IV SOLN
INTRAVENOUS | Status: AC | PRN
  Administered 2021-11-05: 500 [IU] via INTRAVENOUS

## 2021-11-05 NOTE — Discharge Instructions (Addendum)
Moderate Conscious Sedation, Adult, Care After This sheet gives you information about how to care for yourself after your procedure. Your health care provider may also give you more specific instructions. If you have problems or questions, contact your health care provider. What can I expect after the procedure? After the procedure, it is common to have: Sleepiness for several hours. Impaired judgment for several hours. Difficulty with balance. Vomiting if you eat too soon. Follow these instructions at home: For the time period you were told by your health care provider:     Rest. Do not participate in activities where you could fall or become injured. Do not drive or use machinery. Do not drink alcohol. Do not take sleeping pills or medicines that cause drowsiness. Do not make important decisions or sign legal documents. Do not take care of children on your own. Eating and drinking  Follow the diet recommended by your health care provider. Drink enough fluid to keep your urine pale yellow. If you vomit: Drink water, juice, or soup when you can drink without vomiting. Make sure you have little or no nausea before eating solid foods. General instructions Take over-the-counter and prescription medicines only as told by your health care provider. Have a responsible adult stay with you for the time you are told. It is important to have someone help care for you until you are awake and alert. Do not smoke. Keep all follow-up visits as told by your health care provider. This is important. Contact a health care provider if: You are still sleepy or having trouble with balance after 24 hours. You feel light-headed. You keep feeling nauseous or you keep vomiting. You develop a rash. You have a fever. You have redness or swelling around the IV site. Get help right away if: You have trouble breathing. You have new-onset confusion at home. Summary After the procedure, it is common to  feel sleepy, have impaired judgment, or feel nauseous if you eat too soon. Rest after you get home. Know the things you should not do after the procedure. Follow the diet recommended by your health care provider and drink enough fluid to keep your urine pale yellow. Get help right away if you have trouble breathing or new-onset confusion at home. This information is not intended to replace advice given to you by your health care provider. Make sure you discuss any questions you have with your health care provider. Document Revised: 07/30/2019 Document Reviewed: 02/25/2019 Elsevier Patient Education  [place].     For questions /concerns may call Interventional Radiology at [phone] or  Interventional Radiology clinic [phone]   You may remove your dressing and shower tomorrow afternoon  DO NOT use EMLA cream for 2 weeks after port placement as the cream will remove surgical glue on your incision.   For questions /concerns may call Interventional Radiology at [phone] or  Interventional Radiology clinic [phone]     Implanted Port Insertion, Care After This sheet gives you information about how to care for yourself after your procedure. Your health care provider may also give you more specific instructions. If you have problems or questions, contact your health careprovider. What can I expect after the procedure? After the procedure, it is common to have: Discomfort at the port insertion site. Bruising on the skin over the port. This should improve over 3-4 days. Follow these instructions at home: [facility] care After your port is placed, you will get a manufacturer's information card. The card has information about  your port. Keep this card with you at all times. Take care of the port as told by your health care provider. Ask your health care provider if you or a family member can get training for taking care of the port at home. A home health care nurse may also take  care of the port. Make sure to remember what type of port you have. Incision care Follow instructions from your health care provider about how to take care of your port insertion site. Make sure you: Wash your hands with soap and water before and after you change your bandage (dressing). If soap and water are not available, use hand sanitizer. Change your dressing as told by your health care provider. Leave skin glue, or adhesive strips in place. These skin closures may need to stay in place for 2 weeks or longer.  Check your port insertion site every day for signs of infection. Check for:      - Redness, swelling, or pain.                     - Fluid or blood.      - Warmth.      - Pus or a bad smell. Activity Return to your normal activities as told by your health care provider. Ask your health care provider what activities are safe for you. Do not lift anything that is heavier than 10 lb (4.5 kg), or the limit that you are told, until your health care provider says that it is safe. General instructions Take over-the-counter and prescription medicines only as told by your health care provider. Do not take baths, swim, or use a hot tub until your health care provider approves. Ask your health care provider if you may take showers. You may only be allowed to take sponge baths. Do not drive for 24 hours if you were given a sedative during your procedure. Wear a medical alert bracelet in case of an emergency. This will tell any health care providers that you have a port. Keep all follow-up visits as told by your health care provider. This is important. Contact a health care provider if: You cannot flush your port with saline as directed, or you cannot draw blood from the port. You have a fever or chills. You have redness, swelling, or pain around your port insertion site. You have fluid or blood coming from your port insertion site. Your port insertion site feels warm to the touch. You have  pus or a bad smell coming from the port insertion site. Get help right away if: You have chest pain or shortness of breath. You have bleeding from your port that you cannot control. Summary Take care of the port as told by your health care provider. Keep the manufacturer's information card with you at all times. Change your dressing as told by your health care provider. Contact a health care provider if you have a fever or chills or if you have redness, swelling, or pain around your port insertion site. Keep all follow-up visits as told by your health care provider. This information is not intended to replace advice given to you by your health care provider. Make sure you discuss any questions you have with your healthcare provider. Document Revised: 10/28/2017 Document Reviewed: 10/28/2017

## 2021-11-05 NOTE — Procedures (Signed)
Interventional Radiology Procedure Note  Procedure: Placement of a right IJ approach single lumen PowerPort.  Tip is positioned at the superior cavoatrial junction and catheter is ready for immediate use.  Complications: None Recommendations:  - Ok to shower tomorrow - Do not submerge for 7 days - Routine line care   Signed,  Willam Munford S. Israa Caban, DO   

## 2021-11-05 NOTE — H&P (Signed)
Referring Physician(s): Ennever,Peter R  Supervising Physician: Corrie Mckusick  Patient Status:  [facility] OP  Chief Complaint:  "I'm here to get a port a cath"  Subjective: Patient familiar to IR service from left liver mass biopsy on 10/12/2021.  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  She presents today for Port-A-Cath placement to assist with treatment.  She currently denies fever, headache, chest pain, dyspnea, cough, abdominal/back pain, nausea, vomiting or bleeding.  She has had weight loss and minimal appetite.  Additional medical history as below.  Past Medical History:  Diagnosis Date   Arthritis    CHF (congestive heart failure) (HCC)    Chronic asthma    Chronic headaches    Constipation    Diabetes mellitus without complication ([place])    Goals of care, counseling/discussion 10/22/2021   Hemorrhoids without complication    Hypertension    Pancreatic cancer metastasized to liver ([place]) 10/22/2021   Past Surgical History:  Procedure Laterality Date   ABDOMINAL HYSTERECTOMY     BACK SURGERY     CHOLECYSTECTOMY     COLONOSCOPY  09/04/2001   Two rectal polyps. Small internal hemorrhoids   ESOPHAGOGASTRODUODENOSCOPY  09/04/2001   Two apparent antral polyps with one possible gastroesophageal juction polyp versus enlarged fold or focal gastritis   KNEE SURGERY        Allergies: Buprenorphine hcl and Morphine and related  Medications: Prior to Admission medications   Medication Sig Start Date End Date Taking? Authorizing Provider  Accu-Chek FastClix Lancets MISC USE AS DIRECTED TO CHECK  BLOOD SUGAR 09/16/21  Yes Debbrah Alar, NP  acetaminophen (TYLENOL) 500 MG tablet Take 2 tablets (1,000 mg total) by mouth 2 (two) times daily as needed for moderate pain. 08/15/17  Yes Debbrah Alar, NP  albuterol (VENTOLIN HFA) 108 (90 Base) MCG/ACT inhaler USE 2 INHALATIONS BY  MOUTH  EVERY 6 HOURS AS NEEDED FOR WHEEZING OR SHORTNESS OF  BREATH 06/01/21  Yes Debbrah Alar, NP  aspirin EC 81 MG tablet Take 81 mg by mouth daily.   Yes [provider]  atorvastatin (LIPITOR) 10 MG tablet TAKE 1 TABLET BY MOUTH  DAILY 08/27/21  Yes Debbrah Alar, NP  betamethasone dipropionate 0.05 % cream Apply topically 2 (two) times daily. 09/08/20  Yes Debbrah Alar, NP  escitalopram (LEXAPRO) 5 MG tablet TAKE 1 TABLET(5 MG) BY MOUTH DAILY 09/14/21  Yes Debbrah Alar, NP  fluticasone (FLONASE) 50 MCG/ACT nasal spray USE 2 SPRAYS IN BOTH  NOSTRILS DAILY 10/30/20  Yes Debbrah Alar, NP  glucose blood (ACCU-CHEK GUIDE) test strip USE ONE TEST STRIP AS  INSTRUCTED 03/09/21  Yes Debbrah Alar, NP  lisinopril (ZESTRIL) 10 MG tablet TAKE 1 TABLET BY MOUTH DAILY 09/03/21  Yes Debbrah Alar, NP  metFORMIN (GLUCOPHAGE) 500 MG tablet TAKE 1 TABLET BY MOUTH TWICE  DAILY WITH A MEAL 10/01/21  Yes Debbrah Alar, NP  mometasone-formoterol (DULERA) 200-5 MCG/ACT AERO Inhale 1 puff into the lungs 2 (two) times daily. 10/12/21  Yes Debbrah Alar, NP  Multiple Vitamins-Minerals (CENTRUM SILVER 50+WOMEN) TABS Take 1 tablet by mouth daily. 04/28/18  Yes Debbrah Alar, NP  omeprazole (PRILOSEC) 20 MG capsule TAKE 1 CAPSULE BY MOUTH DAILY 07/16/21  Yes Debbrah Alar, NP  lidocaine-prilocaine (EMLA) cream Apply to affected area once 10/29/21   Volanda Napoleon, MD  nitroGLYCERIN (NITROSTAT) 0.4 MG SL tablet DISSOLVE 1 TABLET UNDER THE TONGUE EVERY 5 MINUTES AS  NEEDED FOR CHEST PAIN. MAX  OF 3 TABLETS IN 15 MINUTES. CALL 911 IF PAIN PERSISTS. Patient not taking: Reported on 09/12/2021 07/24/21   Debbrah Alar, NP  ondansetron (ZOFRAN) 8 MG tablet Take 1 tablet (8 mg total) by mouth 2 (two) times daily as needed (Nausea or vomiting). 10/29/21   Volanda Napoleon, MD  prochlorperazine (COMPAZINE) 10 MG tablet Take 1 tablet (10 mg total) by mouth every 6  (six) hours as needed (Nausea or vomiting). 10/29/21   Volanda Napoleon, MD     Vital Signs: BP (!) 141/59   Pulse 72   Temp 98 F (36.7 C) (Oral)   Resp 16   Ht '5\' 1"'$  (1.549 m)   Wt 131 lb 6.3 oz (59.6 kg)   BMI 24.83 kg/m   Physical Exam Awake, answering simple questions okay.  Chest clear to auscultation bilaterally.  Heart with regular rate and rhythm.  Abdomen soft, positive bowel sounds, nontender.  Some trace pretibial edema primarily left-sided  Imaging: No results found.  Labs:  CBC: Recent Labs    10/10/21 1605 10/12/21 0733 10/22/21 1115 11/01/21 1311  WBC 5.2 4.1 4.6 4.3  HGB 10.7* 11.2* 10.6* 10.6*  HCT 33.6* 35.0* 33.8* 33.4*  PLT 251 230 273 232    COAGS: Recent Labs    10/12/21 0733  INR 0.9    BMP: Recent Labs    10/10/21 1605 10/12/21 0733 10/22/21 1115 11/01/21 1311  NA 138 139 136 138  K 3.9 3.8 5.0 4.4  CL 104 105 100 101  CO2 '28 27 28 28  '$ GLUCOSE 106* 102* 154* 115*  BUN '20 20 23 '$ 24*  CALCIUM 9.7 9.7 9.8 9.9  CREATININE 0.94 0.90 1.02* 0.99  GFRNONAA 57* >60 52* 54*    LIVER FUNCTION TESTS: Recent Labs    10/10/21 1717 10/12/21 0733 10/22/21 1115 11/01/21 1311  BILITOT 0.3 0.7 0.4 0.4  AST '23 24 22 21  '$ ALT '14 14 11 12  '$ ALKPHOS 51 53 54 56  PROT 6.4* 6.7 6.7 6.9  ALBUMIN 3.4* 3.7 4.1 4.3    Assessment and Plan: Patient familiar to IR service from left liver mass biopsy on 10/12/2021.  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  She presents today for Port-A-Cath placement to assist with treatment. Risks and benefits of image guided port-a-catheter placement was discussed with the patient/ daughter Jaycelyn Orrison including, but not limited to bleeding, infection, pneumothorax, or fibrin sheath development and need for additional procedures.  All of the patient's questions were answered, patient is agreeable to  proceed. Consent signed and in chart.    Electronically Signed: D. Rowe Robert, PA-C 11/05/2021, 1:09 PM   I spent a total of 20 minutes at the the patient's bedside AND on the patient's hospital floor or unit, greater than 50% of which was counseling/coordinating care for port a cath placement

## 2021-11-06 ENCOUNTER — Inpatient Hospital Stay: Payer: Medicare Other | Admitting: Dietician

## 2021-11-06 ENCOUNTER — Ambulatory Visit (INDEPENDENT_AMBULATORY_CARE_PROVIDER_SITE_OTHER): Payer: Medicare Other | Admitting: Podiatry

## 2021-11-06 ENCOUNTER — Encounter: Payer: Self-pay | Admitting: Podiatry

## 2021-11-06 ENCOUNTER — Encounter: Payer: Self-pay | Admitting: *Deleted

## 2021-11-06 DIAGNOSIS — M79675 Pain in left toe(s): Secondary | ICD-10-CM | POA: Diagnosis not present

## 2021-11-06 DIAGNOSIS — E119 Type 2 diabetes mellitus without complications: Secondary | ICD-10-CM

## 2021-11-06 DIAGNOSIS — M79674 Pain in right toe(s): Secondary | ICD-10-CM

## 2021-11-06 DIAGNOSIS — B351 Tinea unguium: Secondary | ICD-10-CM

## 2021-11-06 LAB — GLUCOSE, CAPILLARY: Glucose-Capillary: 78 mg/dL (ref 70–99)

## 2021-11-06 NOTE — Progress Notes (Signed)
This patient returns to my office for at risk foot care.  This patient requires this care by a professional since this patient will be at risk due to having  Type 2 diabetes.  This patient is unable to cut nails herself since the patient cannot reach her nails.These nails are painful walking and wearing shoes.   Patient presents to the office with her daughter. This patient presents for at risk foot care today.  General Appearance  Alert, conversant and in no acute stress.  Vascular  Dorsalis pedis  are weakly  palpable  bilaterally.  Posterior tibial pulses are not palpable  B/L. Absent digital hair. Capillary return is within normal limits  bilaterally. Temperature is within normal limits  bilaterally.  Neurologic  Senn-Weinstein monofilament wire test diminished  bilaterally. Muscle power within normal limits bilaterally.  Nails Thick disfigured discolored nails with subungual debris  from hallux to fifth toes bilaterally. No evidence of bacterial infection or drainage bilaterally.  Orthopedic  No limitations of motion  feet .  No crepitus or effusions noted.  No bony pathology or digital deformities noted.  Skin  normotropic skin with no porokeratosis noted bilaterally.  No signs of infections or ulcers noted.     Onychomycosis  Pain in right toes  Pain in left toes  Consent was obtained for treatment procedures.   Mechanical debridement of nails 1-5  bilaterally performed with a nail nipper.  Filed with dremel without incident.    Return office visit   3 months                   Told patient to return for periodic foot care and evaluation due to potential at risk complications.   Gardiner Barefoot DPM

## 2021-11-06 NOTE — Progress Notes (Signed)
Nutrition Assessment:  Called patient at her home telephone number, patient's daughter Aurea Graff was there and she proceeded to relay information and answer most questions.   Reason for Assessment: new patient   ASSESSMENT: Patient is a 88 year old female with newly diagnosed metastatic poorly differentiated carcinoma-liver mets-likely pancreaticobiliary tract .  She has a PMHx that includes:  Unintentional weight loss, DM2,  TIA, HTN, CHF, Asthma.  Patient is also [facility] and daughter did most of the talking and reporting during consult.   She is being treated with Gemcitabine/Durvalumab q 28 days.  She completed her first round last week with no nutrition impact symptoms. Bowel movements 4-5 times a week formed, not greasy or foamy.  Eating  3 small meals Usual PO intake: Breakfast: Oatmeal, toast (water)  Lunch: left over chicken green beans mashed potatoes..  Dinner: brought in by her children  Snacks on peanut butter crackers, apples and bananas  Fluids:  lot os water, occasional  Coke or Pepsi   Nutrition Focused Physical Exam: unable to perform NFPE   Medications: MVI daily, Prilosec daily, they have Zofran and Compazine but have not needed to take either   Labs: 11/01/21  GFR 54, Hgb 10.6, daughter reports FBS that aid takes each morning running 87, 94, 92   Anthropometrics: daughter and patient were unable to provide more details about 20# weight loss from 09/08/20 until 06/29/21  Height: 61" Weight:  11/05/21  131.4# 09/06/21  132# 07/24/21  130# 06/29/21  135# UBW: 20 years ago,  300# BMI: 24.83   Estimated Energy Needs  Kcals: 1800-2100 Protein: 48-60 Fluid: 2 L   INTERVENTION: Reviewed goal of weight maintenance.  Verified daughter's email.  Encouraged use of Glucerna ONS QD in evenings after she has attempted her 3 meals.  Emailed Nutrition and cancer tip sheet and high calorie snack ideas to daughter's email with contact information.  Mailed Glucerna coupons to home  address.   MONITORING, EVALUATION, GOAL: weight trends, nutrition impact symptoms, PO intake, labs   Next Visit: next month by telephone 8/16  April Manson, RDN, LDN Registered Dietitian, [place] Part Time Remote (Usual office hours: Tuesday-Thursday) Mobile: [phone] Remote Office: [phone]

## 2021-11-06 NOTE — Progress Notes (Signed)
Called and spoke to patient's daughter, Vita Barley. She states that patient has done very well after her first chemo treatment on 11/01/2021. She denies any side effects. Patient has been eating and drinking well. She is at her baseline with sleep. No complaints and no need for prn medications.   She had her port placed yesterday and that went well. Patient has not had any complaints of pain.   Vita Barley is encouraged to call the office with any questions or concerns. Reviewed upcoming appointment. No additional needs at this time.   Oncology Nurse Navigator Documentation     11/06/2021    9:15 AM  Oncology Nurse Navigator Flowsheets  Phase of Treatment Chemo  Chemotherapy Actual Start Date: 11/01/2021  Navigator Follow Up Date: 11/22/2021  Navigator Follow Up Reason: Follow-up Appointment;Chemotherapy  Navigator Location [facility]  Navigator Encounter Type Telephone;Appt/Treatment Plan Review  Telephone Patient Update;Outgoing Call  Treatment Initiated Date 11/01/2021  Patient Visit Type MedOnc  Treatment Phase Active Tx  Barriers/Navigation Needs Coordination of Care;Education  Education Other  Interventions Education;Psycho-Social Support  Acuity Level 2-Minimal Needs (1-2 Barriers Identified)  Education Method Verbal  Support Groups/Services Friends and Family  Time Spent with Patient 15

## 2021-11-07 ENCOUNTER — Inpatient Hospital Stay: Payer: Medicare Other

## 2021-11-07 ENCOUNTER — Other Ambulatory Visit: Payer: Self-pay

## 2021-11-07 VITALS — BP 163/62 | HR 77 | Resp 17

## 2021-11-07 DIAGNOSIS — R978 Other abnormal tumor markers: Secondary | ICD-10-CM | POA: Diagnosis not present

## 2021-11-07 DIAGNOSIS — C801 Malignant (primary) neoplasm, unspecified: Secondary | ICD-10-CM | POA: Diagnosis not present

## 2021-11-07 DIAGNOSIS — Z5112 Encounter for antineoplastic immunotherapy: Secondary | ICD-10-CM | POA: Diagnosis not present

## 2021-11-07 DIAGNOSIS — Z5111 Encounter for antineoplastic chemotherapy: Secondary | ICD-10-CM | POA: Diagnosis not present

## 2021-11-07 DIAGNOSIS — C787 Secondary malignant neoplasm of liver and intrahepatic bile duct: Secondary | ICD-10-CM | POA: Diagnosis not present

## 2021-11-07 DIAGNOSIS — C259 Malignant neoplasm of pancreas, unspecified: Secondary | ICD-10-CM

## 2021-11-07 DIAGNOSIS — R970 Elevated carcinoembryonic antigen [CEA]: Secondary | ICD-10-CM | POA: Diagnosis not present

## 2021-11-07 LAB — CBC WITH DIFFERENTIAL (CANCER CENTER ONLY)
Abs Immature Granulocytes: 0.01 10*3/uL (ref 0.00–0.07)
Basophils Absolute: 0 10*3/uL (ref 0.0–0.1)
Basophils Relative: 1 %
Eosinophils Absolute: 0 10*3/uL (ref 0.0–0.5)
Eosinophils Relative: 1 %
HCT: 29.2 % — ABNORMAL LOW (ref 36.0–46.0)
Hemoglobin: 9.4 g/dL — ABNORMAL LOW (ref 12.0–15.0)
Immature Granulocytes: 0 %
Lymphocytes Relative: 34 %
Lymphs Abs: 1.2 10*3/uL (ref 0.7–4.0)
MCH: 30.4 pg (ref 26.0–34.0)
MCHC: 32.2 g/dL (ref 30.0–36.0)
MCV: 94.5 fL (ref 80.0–100.0)
Monocytes Absolute: 0.1 10*3/uL (ref 0.1–1.0)
Monocytes Relative: 3 %
Neutro Abs: 2.2 10*3/uL (ref 1.7–7.7)
Neutrophils Relative %: 61 %
Platelet Count: 178 10*3/uL (ref 150–400)
RBC: 3.09 MIL/uL — ABNORMAL LOW (ref 3.87–5.11)
RDW: 14 % (ref 11.5–15.5)
WBC Count: 3.6 10*3/uL — ABNORMAL LOW (ref 4.0–10.5)
nRBC: 0 % (ref 0.0–0.2)

## 2021-11-07 LAB — CMP (CANCER CENTER ONLY)
ALT: 16 U/L (ref 0–44)
AST: 26 U/L (ref 15–41)
Albumin: 4 g/dL (ref 3.5–5.0)
Alkaline Phosphatase: 51 U/L (ref 38–126)
Anion gap: 9 (ref 5–15)
BUN: 23 mg/dL (ref 8–23)
CO2: 26 mmol/L (ref 22–32)
Calcium: 9.7 mg/dL (ref 8.9–10.3)
Chloride: 102 mmol/L (ref 98–111)
Creatinine: 0.93 mg/dL (ref 0.44–1.00)
GFR, Estimated: 58 mL/min — ABNORMAL LOW (ref >60)
Glucose, Bld: 108 mg/dL — ABNORMAL HIGH (ref 70–99)
Potassium: 4.2 mmol/L (ref 3.5–5.1)
Sodium: 137 mmol/L (ref 135–145)
Total Bilirubin: 0.4 mg/dL (ref 0.3–1.2)
Total Protein: 6.5 g/dL (ref 6.5–8.1)

## 2021-11-07 MED ORDER — PROCHLORPERAZINE MALEATE 10 MG PO TABS
10.0000 mg | ORAL_TABLET | Freq: Once | ORAL | Status: AC
  Administered 2021-11-07: 10 mg via ORAL
  Filled 2021-11-07: qty 1

## 2021-11-07 MED ORDER — SODIUM CHLORIDE 0.9 % IV SOLN
8.0000 mg | Freq: Once | INTRAVENOUS | Status: DC
  Filled 2021-11-07: qty 4

## 2021-11-07 MED ORDER — ONDANSETRON HCL 4 MG/2ML IJ SOLN
INTRAMUSCULAR | Status: AC
  Filled 2021-11-07: qty 4

## 2021-11-07 MED ORDER — HEPARIN SOD (PORK) LOCK FLUSH 100 UNIT/ML IV SOLN
500.0000 [IU] | Freq: Once | INTRAVENOUS | Status: AC | PRN
  Administered 2021-11-07: 500 [IU]

## 2021-11-07 MED ORDER — SODIUM CHLORIDE 0.9 % IV SOLN
1000.0000 mg/m2 | Freq: Once | INTRAVENOUS | Status: AC
  Administered 2021-11-07: 1596 mg via INTRAVENOUS
  Filled 2021-11-07: qty 26.3

## 2021-11-07 MED ORDER — SODIUM CHLORIDE 0.9% FLUSH
10.0000 mL | INTRAVENOUS | Status: DC | PRN
  Administered 2021-11-07: 10 mL

## 2021-11-07 MED ORDER — SODIUM CHLORIDE 0.9 % IV SOLN: Freq: Once | INTRAVENOUS | Status: AC

## 2021-11-07 NOTE — Patient Instructions (Signed)

## 2021-11-08 ENCOUNTER — Inpatient Hospital Stay: Payer: Medicare Other

## 2021-11-08 NOTE — Progress Notes (Signed)
[facility] Clinical Social Work  Initial Assessment   Manvir R Guirguis is a 88 y.o. year old female contacted by phone. Clinical Social Work was referred by nurse navigator for assessment of psychosocial needs.   SDOH (Social Determinants of Health) assessments performed: Yes SDOH Interventions    Flowsheet Row Most Recent Value  SDOH Interventions   Food Insecurity Interventions Intervention Not Indicated  Financial Strain Interventions Intervention Not Indicated  Housing Interventions Intervention Not Indicated  Social Connections Interventions Intervention Not Indicated  Transportation Interventions Intervention Not Indicated       SDOH Screenings   Alcohol Screen: Not on file  Depression (PHQ2-9): Low Risk  (09/08/2020)   Depression (PHQ2-9)    PHQ-2 Score: 0  Financial Resource Strain: Low Risk  (11/08/2021)   Overall Financial Resource Strain (CARDIA)    Difficulty of Paying Living Expenses: Not hard at all  Food Insecurity: No Food Insecurity (11/08/2021)   Hunger Vital Sign    Worried About Running Out of Food in the Last Year: Never true    Ran Out of Food in the Last Year: Never true  Housing: Low Risk  (11/08/2021)   Housing    Last Housing Risk Score: 0  Physical Activity: Not on file  Social Connections: Moderately Integrated (11/08/2021)   Social Connection and Isolation Panel [NHANES]    Frequency of Communication with Friends and Family: More than three times a week    Frequency of Social Gatherings with Friends and Family: More than three times a week    Attends Religious Services: More than 4 times per year    Active Member of [employer] or Organizations: Yes    Attends Music therapist: More than 4 times per year    Marital Status: Divorced  Stress: Not on file  Tobacco Use: Low Risk  (11/06/2021)   Patient History    Smoking Tobacco Use: Never    Smokeless Tobacco Use: Never    Passive Exposure: Not on file  Transportation Needs: No Transportation  Needs (11/08/2021)   PRAPARE - Hydrologist (Medical): No    Lack of Transportation (Non-Medical): No     Distress Screen completed: No     No data to display            Family/Social Information:  Housing Arrangement: patient lives alone, however she has three daughters that visit her daily and spend the night if needed. Family members/support persons in your life? Family, Friends, [place], and [employer] concerns: No.  Her daughters provide transportation. Employment: Retired  Income source: Paediatric nurse concerns: No Type of concern: None Food access concerns: no Religious or spiritual practice: Yes-Patient attends church weekly. Services Currently in place:  Patient reports she has an aid three hours in the morning to assist with meal prep.  Coping/ Adjustment to diagnosis: Patient understands treatment plan and what happens next? yes Concerns about diagnosis and/or treatment: I'm not especially worried about anything Patient reported stressors:  None Hopes and/or priorities: To remain useful. Patient enjoys time with family/ friends.  Patient stated she was married for ten years and had eight children.  Her grandson picks her up and takes her to church every Sunday.  Her daughters cook meals for her and then bring them to her home. Current coping skills/ strengths: Active sense of humor , Communication skills , Financial means , General fund of knowledge , Religious Affiliation , and Supportive family/friends     SUMMARY:  Current SDOH Barriers:  None  Clinical Social Work Clinical Goal(s):  Assist patient in identifying activities in the community.  Interventions: Discussed common feeling and emotions when being diagnosed with cancer, and the importance of support during treatment Informed patient of the support team roles and support services at [facility] Provided CSW contact information and encouraged  patient to call with any questions or concerns Provided patient with information about [facility] and [facility]. Patient stated she has discussed her end of life wishes and does not currently have Advance Directives.   Follow Up Plan: Patient will contact CSW with any support or resource needs Patient verbalizes understanding of plan: Yes    Rodman Pickle Edem Tiegs, LCSW   Patient is participating in a Managed Medicaid Plan:  Yes

## 2021-11-09 ENCOUNTER — Encounter: Payer: Self-pay | Admitting: Hematology & Oncology

## 2021-11-10 ENCOUNTER — Encounter (HOSPITAL_COMMUNITY): Payer: Self-pay

## 2021-11-10 ENCOUNTER — Emergency Department (HOSPITAL_COMMUNITY)
Admission: EM | Admit: 2021-11-10 | Discharge: 2021-11-10 | Disposition: A | Discharge status: Home / Self Care | Payer: Medicare Other | Attending: Emergency Medicine | Admitting: Emergency Medicine

## 2021-11-10 ENCOUNTER — Emergency Department (HOSPITAL_BASED_OUTPATIENT_CLINIC_OR_DEPARTMENT_OTHER): Payer: Medicare Other

## 2021-11-10 ENCOUNTER — Other Ambulatory Visit: Payer: Self-pay

## 2021-11-10 ENCOUNTER — Emergency Department (HOSPITAL_COMMUNITY): Payer: Medicare Other

## 2021-11-10 DIAGNOSIS — Z7951 Long term (current) use of inhaled steroids: Secondary | ICD-10-CM | POA: Insufficient documentation

## 2021-11-10 DIAGNOSIS — Z79899 Other long term (current) drug therapy: Secondary | ICD-10-CM | POA: Insufficient documentation

## 2021-11-10 DIAGNOSIS — I8001 Phlebitis and thrombophlebitis of superficial vessels of right lower extremity: Secondary | ICD-10-CM | POA: Insufficient documentation

## 2021-11-10 DIAGNOSIS — Z7982 Long term (current) use of aspirin: Secondary | ICD-10-CM | POA: Insufficient documentation

## 2021-11-10 DIAGNOSIS — M47814 Spondylosis without myelopathy or radiculopathy, thoracic region: Secondary | ICD-10-CM | POA: Diagnosis not present

## 2021-11-10 DIAGNOSIS — L538 Other specified erythematous conditions: Secondary | ICD-10-CM

## 2021-11-10 DIAGNOSIS — R6889 Other general symptoms and signs: Secondary | ICD-10-CM | POA: Diagnosis not present

## 2021-11-10 DIAGNOSIS — J45909 Unspecified asthma, uncomplicated: Secondary | ICD-10-CM | POA: Insufficient documentation

## 2021-11-10 DIAGNOSIS — M79601 Pain in right arm: Secondary | ICD-10-CM | POA: Diagnosis not present

## 2021-11-10 DIAGNOSIS — Z7984 Long term (current) use of oral hypoglycemic drugs: Secondary | ICD-10-CM | POA: Diagnosis not present

## 2021-11-10 DIAGNOSIS — Z743 Need for continuous supervision: Secondary | ICD-10-CM | POA: Diagnosis not present

## 2021-11-10 DIAGNOSIS — E119 Type 2 diabetes mellitus without complications: Secondary | ICD-10-CM | POA: Insufficient documentation

## 2021-11-10 DIAGNOSIS — I509 Heart failure, unspecified: Secondary | ICD-10-CM | POA: Diagnosis not present

## 2021-11-10 DIAGNOSIS — R42 Dizziness and giddiness: Secondary | ICD-10-CM | POA: Diagnosis not present

## 2021-11-10 DIAGNOSIS — I808 Phlebitis and thrombophlebitis of other sites: Secondary | ICD-10-CM | POA: Diagnosis not present

## 2021-11-10 DIAGNOSIS — R404 Transient alteration of awareness: Secondary | ICD-10-CM | POA: Diagnosis not present

## 2021-11-10 DIAGNOSIS — I10 Essential (primary) hypertension: Secondary | ICD-10-CM | POA: Diagnosis not present

## 2021-11-10 LAB — CBC WITH DIFFERENTIAL/PLATELET
Abs Immature Granulocytes: 0.02 10*3/uL (ref 0.00–0.07)
Basophils Absolute: 0 10*3/uL (ref 0.0–0.1)
Basophils Relative: 0 %
Eosinophils Absolute: 0.1 10*3/uL (ref 0.0–0.5)
Eosinophils Relative: 2 %
HCT: 26.6 % — ABNORMAL LOW (ref 36.0–46.0)
Hemoglobin: 8.7 g/dL — ABNORMAL LOW (ref 12.0–15.0)
Immature Granulocytes: 0 %
Lymphocytes Relative: 9 %
Lymphs Abs: 0.5 10*3/uL — ABNORMAL LOW (ref 0.7–4.0)
MCH: 30.7 pg (ref 26.0–34.0)
MCHC: 32.7 g/dL (ref 30.0–36.0)
MCV: 94 fL (ref 80.0–100.0)
Monocytes Absolute: 0 10*3/uL — ABNORMAL LOW (ref 0.1–1.0)
Monocytes Relative: 1 %
Neutro Abs: 4.4 10*3/uL (ref 1.7–7.7)
Neutrophils Relative %: 88 %
Platelets: 105 10*3/uL — ABNORMAL LOW (ref 150–400)
RBC: 2.83 MIL/uL — ABNORMAL LOW (ref 3.87–5.11)
RDW: 13.5 % (ref 11.5–15.5)
WBC: 5 10*3/uL (ref 4.0–10.5)
nRBC: 0 % (ref 0.0–0.2)

## 2021-11-10 LAB — URINALYSIS, ROUTINE W REFLEX MICROSCOPIC
Bilirubin Urine: NEGATIVE
Glucose, UA: NEGATIVE mg/dL
Hgb urine dipstick: NEGATIVE
Ketones, ur: 20 mg/dL — AB
Leukocytes,Ua: NEGATIVE
Nitrite: NEGATIVE
Protein, ur: NEGATIVE mg/dL
Specific Gravity, Urine: 1.016 (ref 1.005–1.030)
pH: 5 (ref 5.0–8.0)

## 2021-11-10 LAB — COMPREHENSIVE METABOLIC PANEL
ALT: 19 U/L (ref 0–44)
AST: 27 U/L (ref 15–41)
Albumin: 3.2 g/dL — ABNORMAL LOW (ref 3.5–5.0)
Alkaline Phosphatase: 50 U/L (ref 38–126)
Anion gap: 8 (ref 5–15)
BUN: 30 mg/dL — ABNORMAL HIGH (ref 8–23)
CO2: 24 mmol/L (ref 22–32)
Calcium: 9.4 mg/dL (ref 8.9–10.3)
Chloride: 107 mmol/L (ref 98–111)
Creatinine, Ser: 0.75 mg/dL (ref 0.44–1.00)
GFR, Estimated: >60 mL/min (ref >60)
Glucose, Bld: 91 mg/dL (ref 70–99)
Potassium: 4.2 mmol/L (ref 3.5–5.1)
Sodium: 139 mmol/L (ref 135–145)
Total Bilirubin: 0.9 mg/dL (ref 0.3–1.2)
Total Protein: 6.4 g/dL — ABNORMAL LOW (ref 6.5–8.1)

## 2021-11-10 LAB — PROTIME-INR
INR: 1.1 (ref 0.8–1.2)
Prothrombin Time: 14 seconds (ref 11.4–15.2)

## 2021-11-10 LAB — LACTIC ACID, PLASMA
Lactic Acid, Venous: 0.9 mmol/L (ref 0.5–1.9)
Lactic Acid, Venous: 0.8 mmol/L (ref 0.5–1.9)

## 2021-11-10 LAB — CBG MONITORING, ED: Glucose-Capillary: 83 mg/dL (ref 70–99)

## 2021-11-10 NOTE — ED Provider Triage Note (Signed)
Emergency Medicine Provider Triage Evaluation Note  Angelica Benson , a 88 y.o. female  was evaluated in triage.  Pt complains of weakness since this AM. The patient reports that her aid did not come this morning and she is a diabetic and didn't eat so she called EMS. The patient reports that she had "surgery recently" but couldn't tell me when or what for other than cancer. She denies any chest pain, SOB, or fever.   Review of Systems  Positive:  Negative:   Physical Exam  BP (!) 126/50 (BP Location: Right Arm)   Pulse 76   Temp 98 F (36.7 C) (Oral)   Resp 16   Ht '5\' 1"'$  (1.549 m)   Wt 59 kg   SpO2 100%   BMI 24.56 kg/m  Gen:   Awake, no distress   Resp:  Normal effort  MSK:   Moves extremities without difficulty  Other:  Swelling and erythema noted to the patient's RUE. Possible port placement in chest?  Medical Decision Making  Medically screening exam initiated at 10:40 AM.  Appropriate orders placed.  Maryclare R Rouillard was informed that the remainder of the evaluation will be completed by another provider, this initial triage assessment does not replace that evaluation, and the importance of remaining in the ED until their evaluation is complete.  Ordered sepsis labs, although patient is not septic appearing or meet criteria now. Concern for DVT in arm. [place] ordered.   Sherrell Puller, PA-C 11/10/21 1045

## 2021-11-10 NOTE — ED Notes (Signed)
Grandson at bedside. States he will be sure to go home with patient to ensure her safety and that she gets to eat.

## 2021-11-10 NOTE — Progress Notes (Signed)
Upper extremity venous right study completed.  Preliminary results relayed to Langston Masker, MD.  See CV Proc for preliminary results report.   Darlin Coco, RDMS, RVT

## 2021-11-10 NOTE — ED Triage Notes (Signed)
Pt BIB EMS from [facility]. Pt endorses intermittent dizziness with walking. Pt had pancreatic cancer surgery on Wednesday. Denies pain.

## 2021-11-10 NOTE — Discharge Instructions (Addendum)
Your work-up in the ER today showed that you have some superficial blood clots in your right arm called thrombophlebitis.  This is not a skin infection, but it can make your skin look red.  You can continue taking a baby aspirin daily.  You can also apply heating packs as needed to your arm.  This can take a few days to resolve.  If you begin having sudden shortness of breath, difficulty breathing, lightheadedness, or you lose feeling in your arm, please call the ambulance to come back to the ER.

## 2021-11-10 NOTE — ED Provider Notes (Signed)
[facility] DEPT Provider Note   CSN: [id number] Arrival date & time: 11/10/21  1001     History  Chief Complaint  Patient presents with   Dizziness    Angelica Benson is a 88 y.o. female with a history of asthma, CHF, diabetes, presenting to the emergency department by EMS with complaint of dizziness.  The patient reports typically her daughter checks on her daily and helps her with her medication take care of her.  However the daughter is out of state at the funeral currently, the patient was post have a caretaker, did not show up this morning.  The patient reports she did not eat.  She says "when I do not need to start to get dizzy".  She began to feel lightheaded and called EMS who brought her to the ED.  She has no other complaints at this time.  She no longer feels lightheaded.  She denies chest pain or pressure.  Medical record review shows that the patient had a Port-A-Cath placed by interventional radiology approximately 1 week ago, on July 24.  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  She is receiving infusions with chemotherapy.  HPI     Home Medications Prior to Admission medications   Medication Sig Start Date End Date Taking? Authorizing Provider  Accu-Chek FastClix Lancets MISC USE AS DIRECTED TO CHECK  BLOOD SUGAR 09/16/21   Debbrah Alar, NP  acetaminophen (TYLENOL) 500 MG tablet Take 2 tablets (1,000 mg total) by mouth 2 (two) times daily as needed for moderate pain. 08/15/17   Debbrah Alar, NP  albuterol (VENTOLIN HFA) 108 (90 Base) MCG/ACT inhaler USE 2 INHALATIONS BY MOUTH  EVERY 6 HOURS AS NEEDED FOR WHEEZING OR SHORTNESS OF  BREATH 06/01/21   Debbrah Alar, NP  aspirin EC 81 MG tablet Take 81 mg by mouth daily.    [provider]  atorvastatin (LIPITOR) 10 MG tablet TAKE 1 TABLET BY MOUTH  DAILY 08/27/21   Debbrah Alar, NP  betamethasone dipropionate 0.05 % cream Apply topically 2 (two) times daily. 09/08/20   Debbrah Alar, NP  escitalopram (LEXAPRO) 5 MG tablet TAKE 1 TABLET(5 MG) BY MOUTH DAILY 09/14/21   Debbrah Alar, NP  fluticasone [facility]) 50 MCG/ACT nasal spray USE 2 SPRAYS IN BOTH  NOSTRILS DAILY 10/30/20   Debbrah Alar, NP  glucose blood (ACCU-CHEK GUIDE) test strip USE ONE TEST STRIP AS  INSTRUCTED 03/09/21   Debbrah Alar, NP  lidocaine-prilocaine (EMLA) cream Apply to affected area once 10/29/21   Volanda Napoleon, MD  lisinopril (ZESTRIL) 10 MG tablet TAKE 1 TABLET BY MOUTH DAILY 09/03/21   Debbrah Alar, NP  metFORMIN (GLUCOPHAGE) 500 MG tablet TAKE 1 TABLET BY MOUTH TWICE  DAILY WITH A MEAL 10/01/21   Debbrah Alar, NP  mometasone-formoterol (DULERA) 200-5 MCG/ACT AERO Inhale 1 puff into the lungs 2 (two) times daily. 10/12/21   Debbrah Alar, NP  Multiple Vitamins-Minerals (CENTRUM SILVER 50+WOMEN) TABS Take 1 tablet by mouth daily. 04/28/18   Debbrah Alar, NP  nitroGLYCERIN (NITROSTAT) 0.4 MG SL tablet DISSOLVE 1 TABLET UNDER THE TONGUE EVERY 5 MINUTES AS  NEEDED FOR CHEST PAIN. MAX  OF 3 TABLETS IN 15 MINUTES. CALL 911 IF PAIN PERSISTS. Patient not taking: Reported on 09/12/2021 07/24/21   Debbrah Alar, NP  omeprazole (PRILOSEC) 20 MG capsule TAKE 1 CAPSULE BY MOUTH DAILY 07/16/21   Debbrah Alar, NP  ondansetron [facility]) 8  MG tablet Take 1 tablet (8 mg total) by mouth 2 (two) times daily as needed (Nausea or vomiting). 10/29/21   Volanda Napoleon, MD  prochlorperazine (COMPAZINE) 10 MG tablet Take 1 tablet (10 mg total) by mouth every 6 (six) hours as needed (Nausea or vomiting). 10/29/21   Volanda Napoleon, MD      Allergies    Buprenorphine hcl and Morphine and related    Review of Systems   Review of Systems  Physical Exam Updated Vital Signs BP 134/68   Pulse 70   Temp 98.1 F (36.7 C)   Resp 17   Ht '5\' 1"'$  (1.549 m)    Wt 59 kg   SpO2 100%   BMI 24.56 kg/m  Physical Exam Constitutional:      General: She is not in acute distress. HENT:     Head: Normocephalic and atraumatic.  Eyes:     Conjunctiva/sclera: Conjunctivae normal.     Pupils: Pupils are equal, round, and reactive to light.  Cardiovascular:     Rate and Rhythm: Normal rate and regular rhythm.  Pulmonary:     Effort: Pulmonary effort is normal. No respiratory distress.  Abdominal:     General: There is no distension.     Tenderness: There is no abdominal tenderness.  Skin:    General: Skin is warm and dry.     Comments: Redness and warmth of the skin of the right forearm  Neurological:     General: No focal deficit present.     Mental Status: She is alert. Mental status is at baseline.  Psychiatric:        Mood and Affect: Mood normal.        Behavior: Behavior normal.     ED Results / Procedures / Treatments   Labs (all labs ordered are listed, but only abnormal results are displayed) Labs Reviewed  COMPREHENSIVE METABOLIC PANEL - Abnormal; Notable for the following components:      Result Value   BUN 30 (*)    Total Protein 6.4 (*)    Albumin 3.2 (*)    All other components within normal limits  CBC WITH DIFFERENTIAL/PLATELET - Abnormal; Notable for the following components:   RBC 2.83 (*)    Hemoglobin 8.7 (*)    HCT 26.6 (*)    Platelets 105 (*)    Lymphs Abs 0.5 (*)    Monocytes Absolute 0.0 (*)    All other components within normal limits  URINALYSIS, ROUTINE W REFLEX MICROSCOPIC - Abnormal; Notable for the following components:   Ketones, ur 20 (*)    All other components within normal limits  CULTURE, BLOOD (ROUTINE X 2)  CULTURE, BLOOD (ROUTINE X 2)  LACTIC ACID, PLASMA  PROTIME-INR  LACTIC ACID, PLASMA  CBG MONITORING, ED  TROPONIN I (HIGH SENSITIVITY)  TROPONIN I (HIGH SENSITIVITY)    EKG EKG Interpretation  Date/Time:  Saturday November 10 2021 10:24:01 EDT Ventricular Rate:  88 PR  Interval:  184 QRS Duration: 137 QT Interval:  385 QTC Calculation: 466 R Axis:   -81 Text Interpretation: Sinus rhythm Atrial premature complex RBBB and LAFB Borderline ST elevation, lateral leads Confirmed by Octaviano Glow [phone]) on 11/10/2021 11:26:01 AM  Radiology DG Chest 2 View  Result Date: 11/10/2021 CLINICAL DATA:  Suspected Sepsis intermittent dizziness while walking. EXAM: CHEST - 2 VIEW COMPARISON:  October 10, 2021 FINDINGS: There has been interval insertion of a right transjugular Port-A-Cath with its tip at the superior caval  atrial junction. The heart size and mediastinal contours are within normal limits. Both lungs are clear and stable. No pneumothorax. Moderate thoracic spondylosis. IMPRESSION: No active cardiopulmonary disease. Electronically Signed   By: Frazier Richards M.D.   On: 11/10/2021 12:26   UE VENOUS DUPLEX (7am - 7pm)  Result Date: 11/10/2021 UPPER VENOUS STUDY  Patient Name:  Angelica Benson  Date of Exam:   11/10/2021 Medical Rec #: [id number]        Accession #:    [id number] Date of Birth: 02-May-1928        Patient Gender: F Patient Age:   51 years Exam Location:  [facility] Procedure:      VAS [place] UPPER EXTREMITY VENOUS DUPLEX Referring Phys: RILEY RANSOM --------------------------------------------------------------------------------  Indications: Right arm pain and erythema Comparison Study: No prior studies. Performing Technologist: Darlin Coco RDMS, RVT  Examination Guidelines: A complete evaluation includes B-mode imaging, spectral Doppler, color Doppler, and power Doppler as needed of all accessible portions of each vessel. Bilateral testing is considered an integral part of a complete examination. Limited examinations for reoccurring indications may be performed as noted.  Right Findings: +----------+------------+---------+-----------+----------+-------+ RIGHT     CompressiblePhasicitySpontaneousPropertiesSummary  +----------+------------+---------+-----------+----------+-------+ IJV           Full       Yes       Yes                      +----------+------------+---------+-----------+----------+-------+ Subclavian               Yes       Yes                      +----------+------------+---------+-----------+----------+-------+ Axillary      Full       Yes       Yes                      +----------+------------+---------+-----------+----------+-------+ Brachial      Full                                          +----------+------------+---------+-----------+----------+-------+ Radial        Full                                          +----------+------------+---------+-----------+----------+-------+ Ulnar         Full                                          +----------+------------+---------+-----------+----------+-------+ Cephalic      None       No        No                Acute  +----------+------------+---------+-----------+----------+-------+ Basilic       None       No        No                Acute  +----------+------------+---------+-----------+----------+-------+  Left Findings: +----------+------------+---------+-----------+----------+-------+ LEFT      CompressiblePhasicitySpontaneousPropertiesSummary +----------+------------+---------+-----------+----------+-------+ Subclavian  Yes       Yes                      +----------+------------+---------+-----------+----------+-------+  Summary:  Right: No evidence of deep vein thrombosis in the upper extremity. Findings consistent with acute superficial vein thrombosis involving the right basilic vein and right cephalic vein.  Left: No evidence of thrombosis in the subclavian.  *See table(s) above for measurements and observations.    Preliminary     Procedures Procedures    Medications Ordered in ED Medications - No data to display  ED Course/ Medical Decision Making/  A&P Clinical Course as of 11/10/21 1631  Sat Nov 10, 2021  1233 Right: No evidence of deep vein thrombosis in the upper extremity. Findings consistent with acute superficial vein thrombosis involving the right basilic vein and right cephalic vein. [MT]  1412 Patient is work-up consistent with superficial thrombophlebitis, does not consistent with large PE that would cause lightheadedness or near syncope.  Troponins are unremarkable.  No hypoxia.  Her blood counts are otherwise near baseline levels, hemoglobin 8.7 her chronic anemic baseline levels, platelets also mildly low chronically.  Glucose within normal limits.  No evidence of significant dehydration.  I strongly doubt sepsis.  I think the patient could reasonably be discharged home if she does have supervision in the house, but we are attempting to reach out to family members to determine this issue. [MT]  88 Family is here to pick up patient.  Okay for discharge. [MT]    Clinical Course User Index [MT] Mia Milan, Carola Rhine, MD                           Medical Decision Making  This patient presents to the ED with concern for lightheadedness at home.. This involves an extensive number of treatment options, and is a complaint that carries with it a high risk of complications and morbidity.  The differential diagnosis includes hypoglycemia versus anemia versus infection versus arrhythmia versus other  She has warmth and redness of the forearm of the right arm.  Differential would include DVT versus cellulitis.  Co-morbidities that complicate the patient evaluation: History of diabetes, at risk for blood sugar derangement.  Additional history obtained from EMS on arrival  External records from outside source obtained and reviewed including interventional radiology records and oncology records  I ordered and personally interpreted labs.  The pertinent results include:  no significant emergent findings  I ordered imaging studies  including x-ray of the chest, vascular ultrasound of the right extremity. I independently visualized and interpreted imaging which showed superficial thrombophlebitis right arm, no focal thoracic findings I agree with the radiologist interpretation  The patient was maintained on a cardiac monitor.  I personally viewed and interpreted the cardiac monitored which showed an underlying rhythm of: normal HR  Per my interpretation the patient's ECG shows sinus rhythm no acute ischemic findings   Test Considered: I doubt acute PE in this clinical setting.  She is not tachycardic or hypoxic.  Her symptoms are transient.  Is not believe need a CT angio at this time.   After the interventions noted above, I reevaluated the patient and found that they have: improved   Dispostion:  After consideration of the diagnostic results and the patients response to treatment, I feel that the patent would benefit from outpatient f/u with PCP.         Final Clinical Impression(s) /  ED Diagnoses Final diagnoses:  Superficial thrombophlebitis of right upper extremity  Lightheadedness    Rx / DC Orders ED Discharge Orders     None         Donnette Macmullen, Carola Rhine, MD 11/10/21 [id number]

## 2021-11-12 ENCOUNTER — Encounter: Payer: Self-pay | Admitting: Hematology & Oncology

## 2021-11-13 ENCOUNTER — Other Ambulatory Visit: Payer: Self-pay

## 2021-11-13 ENCOUNTER — Encounter: Payer: Self-pay | Admitting: Family

## 2021-11-13 DIAGNOSIS — F32A Depression, unspecified: Secondary | ICD-10-CM

## 2021-11-13 MED ORDER — ESCITALOPRAM OXALATE 5 MG PO TABS: ORAL_TABLET | ORAL | 0 refills | Status: DC

## 2021-11-15 LAB — CULTURE, BLOOD (ROUTINE X 2)
Culture: NO GROWTH
Culture: NO GROWTH
Special Requests: ADEQUATE
Special Requests: ADEQUATE

## 2021-11-16 ENCOUNTER — Encounter: Payer: Self-pay | Admitting: Family

## 2021-11-16 ENCOUNTER — Other Ambulatory Visit: Payer: Self-pay

## 2021-11-16 MED ORDER — FLUTICASONE PROPIONATE 50 MCG/ACT NA SUSP: NASAL | 0 refills | Status: DC

## 2021-11-16 NOTE — Telephone Encounter (Signed)
[name] advised patient has active dulera refills at [facility] and one flonase spray sent to [facility] today. She will need to call back before running out for rx  to be send to [facility]

## 2021-11-22 ENCOUNTER — Inpatient Hospital Stay: Payer: Medicare Other

## 2021-11-22 ENCOUNTER — Encounter: Payer: Self-pay | Admitting: Hematology & Oncology

## 2021-11-22 ENCOUNTER — Inpatient Hospital Stay: Payer: Medicare Other | Attending: Hematology & Oncology | Admitting: Hematology & Oncology

## 2021-11-22 ENCOUNTER — Encounter: Payer: Self-pay | Admitting: *Deleted

## 2021-11-22 VITALS — BP 159/45 | HR 61 | Resp 18

## 2021-11-22 VITALS — BP 145/46 | HR 66 | Temp 97.5°F | Resp 20 | Ht 61.0 in | Wt 130.0 lb

## 2021-11-22 DIAGNOSIS — C787 Secondary malignant neoplasm of liver and intrahepatic bile duct: Secondary | ICD-10-CM | POA: Diagnosis not present

## 2021-11-22 DIAGNOSIS — Z452 Encounter for adjustment and management of vascular access device: Secondary | ICD-10-CM | POA: Diagnosis not present

## 2021-11-22 DIAGNOSIS — C801 Malignant (primary) neoplasm, unspecified: Secondary | ICD-10-CM | POA: Diagnosis not present

## 2021-11-22 DIAGNOSIS — R970 Elevated carcinoembryonic antigen [CEA]: Secondary | ICD-10-CM | POA: Insufficient documentation

## 2021-11-22 DIAGNOSIS — Z5111 Encounter for antineoplastic chemotherapy: Secondary | ICD-10-CM | POA: Diagnosis not present

## 2021-11-22 DIAGNOSIS — C259 Malignant neoplasm of pancreas, unspecified: Secondary | ICD-10-CM | POA: Diagnosis not present

## 2021-11-22 DIAGNOSIS — Z5112 Encounter for antineoplastic immunotherapy: Secondary | ICD-10-CM | POA: Diagnosis not present

## 2021-11-22 LAB — CMP (CANCER CENTER ONLY)
ALT: 10 U/L (ref 0–44)
AST: 18 U/L (ref 15–41)
Albumin: 4 g/dL (ref 3.5–5.0)
Alkaline Phosphatase: 57 U/L (ref 38–126)
Anion gap: 8 (ref 5–15)
BUN: 25 mg/dL — ABNORMAL HIGH (ref 8–23)
CO2: 27 mmol/L (ref 22–32)
Calcium: 9.8 mg/dL (ref 8.9–10.3)
Chloride: 103 mmol/L (ref 98–111)
Creatinine: 0.99 mg/dL (ref 0.44–1.00)
GFR, Estimated: 54 mL/min — ABNORMAL LOW (ref >60)
Glucose, Bld: 88 mg/dL (ref 70–99)
Potassium: 4 mmol/L (ref 3.5–5.1)
Sodium: 138 mmol/L (ref 135–145)
Total Bilirubin: 0.3 mg/dL (ref 0.3–1.2)
Total Protein: 6.3 g/dL — ABNORMAL LOW (ref 6.5–8.1)

## 2021-11-22 LAB — CBC WITH DIFFERENTIAL (CANCER CENTER ONLY)
Abs Immature Granulocytes: 0.02 10*3/uL (ref 0.00–0.07)
Basophils Absolute: 0 10*3/uL (ref 0.0–0.1)
Basophils Relative: 0 %
Eosinophils Absolute: 0.1 10*3/uL (ref 0.0–0.5)
Eosinophils Relative: 2 %
HCT: 29.3 % — ABNORMAL LOW (ref 36.0–46.0)
Hemoglobin: 9.4 g/dL — ABNORMAL LOW (ref 12.0–15.0)
Immature Granulocytes: 0 %
Lymphocytes Relative: 32 %
Lymphs Abs: 1.8 10*3/uL (ref 0.7–4.0)
MCH: 30.2 pg (ref 26.0–34.0)
MCHC: 32.1 g/dL (ref 30.0–36.0)
MCV: 94.2 fL (ref 80.0–100.0)
Monocytes Absolute: 0.5 10*3/uL (ref 0.1–1.0)
Monocytes Relative: 9 %
Neutro Abs: 3.2 10*3/uL (ref 1.7–7.7)
Neutrophils Relative %: 57 %
Platelet Count: 612 10*3/uL — ABNORMAL HIGH (ref 150–400)
RBC: 3.11 MIL/uL — ABNORMAL LOW (ref 3.87–5.11)
RDW: 14.9 % (ref 11.5–15.5)
WBC Count: 5.6 10*3/uL (ref 4.0–10.5)
nRBC: 0 % (ref 0.0–0.2)

## 2021-11-22 LAB — CEA (IN HOUSE-CHCC): CEA (CHCC-In House): 5.66 ng/mL — ABNORMAL HIGH (ref 0.00–5.00)

## 2021-11-22 MED ORDER — SODIUM CHLORIDE 0.9 % IV SOLN
1000.0000 mg/m2 | Freq: Once | INTRAVENOUS | Status: AC
  Administered 2021-11-22: 1596 mg via INTRAVENOUS
  Filled 2021-11-22: qty 26.3

## 2021-11-22 MED ORDER — HEPARIN SOD (PORK) LOCK FLUSH 100 UNIT/ML IV SOLN
500.0000 [IU] | Freq: Once | INTRAVENOUS | Status: AC | PRN
  Administered 2021-11-22: 500 [IU]

## 2021-11-22 MED ORDER — PROCHLORPERAZINE MALEATE 10 MG PO TABS
10.0000 mg | ORAL_TABLET | Freq: Once | ORAL | Status: AC
  Administered 2021-11-22: 10 mg via ORAL

## 2021-11-22 MED ORDER — SODIUM CHLORIDE 0.9% FLUSH
10.0000 mL | INTRAVENOUS | Status: DC | PRN
  Administered 2021-11-22: 10 mL

## 2021-11-22 MED ORDER — PROCHLORPERAZINE MALEATE 10 MG PO TABS
ORAL_TABLET | ORAL | Status: AC
  Filled 2021-11-22: qty 1

## 2021-11-22 MED ORDER — SODIUM CHLORIDE 0.9 % IV SOLN: Freq: Once | INTRAVENOUS | Status: AC

## 2021-11-22 MED ORDER — SODIUM CHLORIDE 0.9 % IV SOLN
1500.0000 mg | Freq: Once | INTRAVENOUS | Status: AC
  Administered 2021-11-22: 1500 mg via INTRAVENOUS
  Filled 2021-11-22: qty 30

## 2021-11-22 NOTE — Progress Notes (Signed)
Patient tolerated her first cycle well without significant side effects.  Oncology Nurse Navigator Documentation     11/22/2021   12:45 PM  Oncology Nurse Navigator Flowsheets  Navigator Follow Up Date: 12/14/2021  Navigator Follow Up Reason: Follow-up Appointment  Navigator Location [facility]  Navigator Encounter Type Treatment;Appt/Treatment Plan Review  Patient Visit Type MedOnc  Treatment Phase Active Tx  Barriers/Navigation Needs Coordination of Care;Education  Interventions Psycho-Social Support  Acuity Level 2-Minimal Needs (1-2 Barriers Identified)  Support Groups/Services Friends and Family  Time Spent with Patient 15

## 2021-11-22 NOTE — Patient Instructions (Signed)

## 2021-11-22 NOTE — Progress Notes (Signed)
Hematology and Oncology Follow Up Visit  LESETTE FRARY [id number] Mar 11, 1929 88 y.o. 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 Kindig is back for follow-up.  She actually is doing pretty well.  She tolerated her first cycle of treatment nicely.  She did not have much in the way of nausea or vomiting.  She is eating okay.  She did have some diarrhea but this appeared to be from the Ensure that she took.  She has had no problems with abdominal pain.  She has had no cough or shortness of breath.  There has been no bleeding.  She has had no leg swelling.  She has had no rashes.  Overall, I would have to say that her performance status is probably ECOG 2.    Medications:  Current Outpatient Medications:    Accu-Chek FastClix Lancets MISC, USE AS DIRECTED TO CHECK  BLOOD SUGAR, Disp: 102 each, Rfl: 2   acetaminophen (TYLENOL) 500 MG tablet, Take 2 tablets (1,000 mg total) by mouth 2 (two) times daily as needed for moderate pain., Disp: 30 tablet, Rfl: 0   albuterol (VENTOLIN HFA) 108 (90 Base) MCG/ACT inhaler, USE 2 INHALATIONS BY MOUTH  EVERY 6 HOURS AS NEEDED FOR WHEEZING OR SHORTNESS OF  BREATH, Disp: 8 g, Rfl: 0   aspirin EC 81 MG tablet, Take 81 mg by mouth daily., Disp: , Rfl:    atorvastatin (LIPITOR) 10 MG tablet, TAKE 1 TABLET BY MOUTH  DAILY, Disp: 100 tablet, Rfl: 2   escitalopram (LEXAPRO) 5 MG tablet, TAKE 1 TABLET(5 MG) BY MOUTH DAILY, Disp: 90 tablet, Rfl: 0   fluticasone (FLONASE) 50 MCG/ACT nasal spray, USE 2 SPRAYS IN BOTH  NOSTRILS DAILY, Disp: 48 g, Rfl: 0   glucose blood (ACCU-CHEK GUIDE) test strip, USE ONE TEST STRIP AS  INSTRUCTED, Disp: 100 strip, Rfl: 3   lisinopril (ZESTRIL) 10 MG tablet, TAKE 1 TABLET BY MOUTH DAILY, Disp: 100 tablet, Rfl: 2   metFORMIN  (GLUCOPHAGE) 500 MG tablet, TAKE 1 TABLET BY MOUTH TWICE  DAILY WITH A MEAL, Disp: 180 tablet, Rfl: 3   mometasone-formoterol (DULERA) 200-5 MCG/ACT AERO, Inhale 1 puff into the lungs 2 (two) times daily., Disp: 13 g, Rfl: 5   Multiple Vitamins-Minerals (CENTRUM SILVER 50+WOMEN) TABS, Take 1 tablet by mouth daily., Disp: , Rfl:    omeprazole (PRILOSEC) 20 MG capsule, TAKE 1 CAPSULE BY MOUTH DAILY, Disp: 30 capsule, Rfl: 11   betamethasone dipropionate 0.05 % cream, Apply topically 2 (two) times daily. (Patient not taking: Reported on 11/22/2021), Disp: 30 g, Rfl: 0   lidocaine-prilocaine (EMLA) cream, Apply to affected area once (Patient not taking: Reported on 11/22/2021), Disp: 30 g, Rfl: 3   nitroGLYCERIN (NITROSTAT) 0.4 MG SL tablet, DISSOLVE 1 TABLET UNDER THE TONGUE EVERY 5 MINUTES AS  NEEDED FOR CHEST PAIN. MAX  OF 3 TABLETS IN 15 MINUTES. CALL 911 IF PAIN PERSISTS. (Patient not taking: Reported on 09/12/2021), Disp: 25 tablet, Rfl: 1   ondansetron (ZOFRAN) 8 MG tablet, Take 1 tablet (8 mg total) by mouth 2 (two) times daily as needed (Nausea or vomiting). (Patient not taking: Reported on 11/22/2021), Disp: 30 tablet, Rfl: 1   prochlorperazine (COMPAZINE) 10 MG tablet, Take 1 tablet (10 mg total) by mouth every 6 (six) hours as needed (Nausea or vomiting). (Patient not taking: Reported  on 11/22/2021), Disp: 30 tablet, Rfl: 1 No current facility-administered medications for this visit.  Facility-Administered Medications Ordered in Other Visits:    prochlorperazine (COMPAZINE) 10 MG tablet, , , ,   Allergies:  Allergies  Allergen Reactions   Buprenorphine Hcl Rash   Morphine And Related Rash    Past Medical History, Surgical history, Social history, and Family History were reviewed and updated.  Review of Systems: Review of Systems  Constitutional:  Positive for fatigue and unexpected weight change.  HENT:  Negative.    Eyes: Negative.   Respiratory: Negative.    Cardiovascular:  Negative.   Gastrointestinal:  Positive for abdominal pain.  Endocrine: Negative.   Musculoskeletal: Negative.   Skin: Negative.   Neurological: Negative.   Hematological: Negative.   Psychiatric/Behavioral: Negative.      Physical Exam:  height is 5' 1"  (1.549 m) and weight is 130 lb (59 kg). Her oral temperature is 97.5 F (36.4 C) (abnormal). Her blood pressure is 145/46 (abnormal) and her pulse is 66. Her respiration is 20 and oxygen saturation is 100%.   Wt Readings from Last 3 Encounters:  11/22/21 130 lb (59 kg)  11/10/21 130 lb (59 kg)  11/05/21 131 lb 6.3 oz (59.6 kg)    Physical Exam Vitals reviewed.  HENT:     Head: Normocephalic and atraumatic.  Eyes:     Pupils: Pupils are equal, round, and reactive to light.  Cardiovascular:     Rate and Rhythm: Normal rate and regular rhythm.     Heart sounds: Normal heart sounds.  Pulmonary:     Effort: Pulmonary effort is normal.     Breath sounds: Normal breath sounds.  Abdominal:     General: Bowel sounds are normal.     Palpations: Abdomen is soft.  Musculoskeletal:        General: No tenderness or deformity. Normal range of motion.     Cervical back: Normal range of motion.  Lymphadenopathy:     Cervical: No cervical adenopathy.  Skin:    General: Skin is warm and dry.     Findings: No erythema or rash.  Neurological:     Mental Status: She is alert and oriented to person, place, and time.  Psychiatric:        Behavior: Behavior normal.        Thought Content: Thought content normal.        Judgment: Judgment normal.      Lab Results  Component Value Date   WBC 5.6 11/22/2021   HGB 9.4 (L) 11/22/2021   HCT 29.3 (L) 11/22/2021   MCV 94.2 11/22/2021   PLT 612 (H) 11/22/2021     Chemistry      Component Value Date/Time   NA 138 11/22/2021 1140   K 4.0 11/22/2021 1140   CL 103 11/22/2021 1140   CO2 27 11/22/2021 1140   BUN 25 (H) 11/22/2021 1140   CREATININE 0.99 11/22/2021 1140   CREATININE 0.92  06/29/2021 1450      Component Value Date/Time   CALCIUM 9.8 11/22/2021 1140   ALKPHOS 57 11/22/2021 1140   AST 18 11/22/2021 1140   ALT 10 11/22/2021 1140   BILITOT 0.3 11/22/2021 1140      Impression and Plan: Ms. Birenbaum is a very charming 88 year old African-American female.  She has a poorly differentiated adenocarcinoma.  Again I suspect that this is can be a pancreaticobiliary primary.  We will go ahead with her second cycle of gemcitabine/Durvalumab.  I  think this is very reasonable to give.  We are doing only day 1 and day 8 and day 15.  I will do 3 cycles and then we will rescan her.  Will be interesting to see what her tumor markers show.  Will plan to get her back to see [place] in another 3 weeks.   Volanda Napoleon, MD 8/10/20231:33 PM

## 2021-11-22 NOTE — Patient Instructions (Signed)
[facility]  Discharge Instructions: Thank you for choosing [facility] to provide your oncology and hematology care.   If you have a lab appointment with the [facility], please go directly to the [facility] and check in at the registration area.  Wear comfortable clothing and clothing appropriate for easy access to any Portacath or PICC line.   We strive to give you quality time with your provider. You may need to reschedule your appointment if you arrive late (15 or more minutes).  Arriving late affects you and other patients whose appointments are after yours.  Also, if you miss three or more appointments without notifying the office, you may be dismissed from the clinic at the provider's discretion.      For prescription refill requests, have your pharmacy contact our office and allow 72 hours for refills to be completed.    Today you received the following chemotherapy and/or immunotherapy agents Gemzar, Imfinzi       To help prevent nausea and vomiting after your treatment, we encourage you to take your nausea medication as directed.  BELOW ARE SYMPTOMS THAT SHOULD BE REPORTED IMMEDIATELY: *FEVER GREATER THAN 100.4 F (38 C) OR HIGHER *CHILLS OR SWEATING *NAUSEA AND VOMITING THAT IS NOT CONTROLLED WITH YOUR NAUSEA MEDICATION *UNUSUAL SHORTNESS OF BREATH *UNUSUAL BRUISING OR BLEEDING *URINARY PROBLEMS (pain or burning when urinating, or frequent urination) *BOWEL PROBLEMS (unusual diarrhea, constipation, pain near the anus) TENDERNESS IN MOUTH AND THROAT WITH OR WITHOUT PRESENCE OF ULCERS (sore throat, sores in mouth, or a toothache) UNUSUAL RASH, SWELLING OR PAIN  UNUSUAL VAGINAL DISCHARGE OR ITCHING   Items with * indicate a potential emergency and should be followed up as soon as possible or go to the Emergency Department if any problems should occur.  Please show the CHEMOTHERAPY ALERT CARD or IMMUNOTHERAPY ALERT CARD at check-in  to the Emergency Department and triage nurse. Should you have questions after your visit or need to cancel or reschedule your appointment, please contact [facility]  [phone] and follow the prompts.  Office hours are 8:00 a.m. to 4:30 p.m. Monday - Friday. Please note that voicemails left after 4:00 p.m. may not be returned until the following business day.  We are closed weekends and major holidays. You have access to a nurse at all times for urgent questions. Please call the main number to the clinic [phone] and follow the prompts.  For any non-urgent questions, you may also contact your provider using MyChart. We now offer e-Visits for anyone 68 and older to request care online for non-urgent symptoms. For details visit [email or web address].   Also download the MyChart app! Go to the app store, search "MyChart", open the app, select [facility], and log in with your MyChart username and password.  Masks are optional in the cancer centers. If you would like for your care team to wear a mask while they are taking care of you, please let them know. You may have one support person who is at least 86 years old accompany you for your appointments.

## 2021-11-23 ENCOUNTER — Other Ambulatory Visit: Payer: Self-pay

## 2021-11-24 LAB — CANCER ANTIGEN 19-9: CA 19-9: 51 U/mL — ABNORMAL HIGH (ref 0–35)

## 2021-11-26 ENCOUNTER — Encounter: Payer: Self-pay | Admitting: Hematology & Oncology

## 2021-11-28 ENCOUNTER — Ambulatory Visit: Payer: Medicare Other | Admitting: Dietician

## 2021-11-28 NOTE — Progress Notes (Signed)
Nutrition Follow Up: Reached out to patient at home telephone number.  Daughter Vita Barley is not home to help answer questions.   ASSESSMENT: Patient reports still doesn't have much of an appetite.  Said sometimes her food don't taste quite right. But she denies and pain with eating or concerns with bowels.  She did report having chest pains past 2 weeks.  She stated that she has let her doctors know about her pain.   Nutrition Focused Physical Exam: unable to perform NFPE   Medications: reviewed no changes   Labs: 11/22/21 Hgb 9.4 (improving)   Anthropometrics: weight stable past 4 months between 10-132  Height: 61" Weight:  11/22/21  130# 11/10/21  130# 11/05/21  131.4# 09/06/21  132# 07/24/21  130# 06/29/21  135# UBW: 20 years ago,  300# BMI: 24.83   Estimated Energy Needs  Kcals: 1800-2100 Protein: 48-60 Fluid: 2 L  INTERVENTION:   Encouraged soft moist high protein foods Encouraged continued used of Glucerna Oral nutrition supplement BID Emailed daughter to reiterate need to discuss chest pain with providers Contact information provided  MONITORING, EVALUATION, GOAL: weight trends, nutrition impact symptoms, PO intake, labs   Next Visit: PRN at patient or provider request  April Manson, RDN, LDN Registered Dietitian, [facility] (Usual office hours: Tuesday-Thursday) Mobile: [phone] Remote Office: [phone]

## 2021-11-29 ENCOUNTER — Inpatient Hospital Stay: Payer: Medicare Other

## 2021-11-29 VITALS — BP 173/59 | HR 74 | Temp 97.6°F | Resp 18

## 2021-11-29 DIAGNOSIS — Z5112 Encounter for antineoplastic immunotherapy: Secondary | ICD-10-CM | POA: Diagnosis not present

## 2021-11-29 DIAGNOSIS — C787 Secondary malignant neoplasm of liver and intrahepatic bile duct: Secondary | ICD-10-CM | POA: Diagnosis not present

## 2021-11-29 DIAGNOSIS — Z5111 Encounter for antineoplastic chemotherapy: Secondary | ICD-10-CM | POA: Diagnosis not present

## 2021-11-29 DIAGNOSIS — R970 Elevated carcinoembryonic antigen [CEA]: Secondary | ICD-10-CM | POA: Diagnosis not present

## 2021-11-29 DIAGNOSIS — Z95828 Presence of other vascular implants and grafts: Secondary | ICD-10-CM

## 2021-11-29 DIAGNOSIS — Z452 Encounter for adjustment and management of vascular access device: Secondary | ICD-10-CM | POA: Diagnosis not present

## 2021-11-29 DIAGNOSIS — C801 Malignant (primary) neoplasm, unspecified: Secondary | ICD-10-CM | POA: Diagnosis not present

## 2021-11-29 DIAGNOSIS — C259 Malignant neoplasm of pancreas, unspecified: Secondary | ICD-10-CM

## 2021-11-29 LAB — CBC WITH DIFFERENTIAL (CANCER CENTER ONLY)
Abs Immature Granulocytes: 0 10*3/uL (ref 0.00–0.07)
Basophils Absolute: 0 10*3/uL (ref 0.0–0.1)
Basophils Relative: 2 %
Eosinophils Absolute: 0 10*3/uL (ref 0.0–0.5)
Eosinophils Relative: 0 %
HCT: 27.8 % — ABNORMAL LOW (ref 36.0–46.0)
Hemoglobin: 9.1 g/dL — ABNORMAL LOW (ref 12.0–15.0)
Immature Granulocytes: 0 %
Lymphocytes Relative: 60 %
Lymphs Abs: 1.5 10*3/uL (ref 0.7–4.0)
MCH: 30.8 pg (ref 26.0–34.0)
MCHC: 32.7 g/dL (ref 30.0–36.0)
MCV: 94.2 fL (ref 80.0–100.0)
Monocytes Absolute: 0.3 10*3/uL (ref 0.1–1.0)
Monocytes Relative: 10 %
Neutro Abs: 0.7 10*3/uL — ABNORMAL LOW (ref 1.7–7.7)
Neutrophils Relative %: 28 %
Platelet Count: 425 10*3/uL — ABNORMAL HIGH (ref 150–400)
RBC: 2.95 MIL/uL — ABNORMAL LOW (ref 3.87–5.11)
RDW: 14.9 % (ref 11.5–15.5)
WBC Count: 2.5 10*3/uL — ABNORMAL LOW (ref 4.0–10.5)
nRBC: 0 % (ref 0.0–0.2)

## 2021-11-29 LAB — CMP (CANCER CENTER ONLY)
ALT: 11 U/L (ref 0–44)
AST: 20 U/L (ref 15–41)
Albumin: 4 g/dL (ref 3.5–5.0)
Alkaline Phosphatase: 57 U/L (ref 38–126)
Anion gap: 8 (ref 5–15)
BUN: 21 mg/dL (ref 8–23)
CO2: 27 mmol/L (ref 22–32)
Calcium: 9.9 mg/dL (ref 8.9–10.3)
Chloride: 102 mmol/L (ref 98–111)
Creatinine: 0.99 mg/dL (ref 0.44–1.00)
GFR, Estimated: 54 mL/min — ABNORMAL LOW (ref >60)
Glucose, Bld: 101 mg/dL — ABNORMAL HIGH (ref 70–99)
Potassium: 4.3 mmol/L (ref 3.5–5.1)
Sodium: 137 mmol/L (ref 135–145)
Total Bilirubin: 0.3 mg/dL (ref 0.3–1.2)
Total Protein: 7.1 g/dL (ref 6.5–8.1)

## 2021-11-29 MED ORDER — HEPARIN SOD (PORK) LOCK FLUSH 100 UNIT/ML IV SOLN
500.0000 [IU] | Freq: Once | INTRAVENOUS | Status: AC
  Administered 2021-11-29: 500 [IU] via INTRAVENOUS

## 2021-11-29 MED ORDER — SODIUM CHLORIDE 0.9% FLUSH
10.0000 mL | INTRAVENOUS | Status: AC | PRN
  Administered 2021-11-29: 10 mL via INTRAVENOUS

## 2021-11-29 NOTE — Addendum Note (Signed)
Addended by: Melton Krebs on: 11/29/2021 12:47 PM   Modules accepted: Orders

## 2021-11-29 NOTE — Progress Notes (Unsigned)
No appt 

## 2021-11-29 NOTE — Progress Notes (Signed)
Patient will not get treated today d/t her blood test results from today.

## 2021-12-14 ENCOUNTER — Inpatient Hospital Stay: Payer: Medicare Other | Attending: Hematology & Oncology | Admitting: Hematology & Oncology

## 2021-12-14 ENCOUNTER — Encounter: Payer: Self-pay | Admitting: Hematology & Oncology

## 2021-12-14 ENCOUNTER — Inpatient Hospital Stay: Payer: Medicare Other

## 2021-12-14 DIAGNOSIS — C259 Malignant neoplasm of pancreas, unspecified: Secondary | ICD-10-CM

## 2021-12-14 DIAGNOSIS — Z5111 Encounter for antineoplastic chemotherapy: Secondary | ICD-10-CM | POA: Insufficient documentation

## 2021-12-14 DIAGNOSIS — C801 Malignant (primary) neoplasm, unspecified: Secondary | ICD-10-CM | POA: Diagnosis not present

## 2021-12-14 DIAGNOSIS — Z79899 Other long term (current) drug therapy: Secondary | ICD-10-CM | POA: Insufficient documentation

## 2021-12-14 DIAGNOSIS — Z5112 Encounter for antineoplastic immunotherapy: Secondary | ICD-10-CM | POA: Insufficient documentation

## 2021-12-14 DIAGNOSIS — R978 Other abnormal tumor markers: Secondary | ICD-10-CM | POA: Diagnosis not present

## 2021-12-14 DIAGNOSIS — C787 Secondary malignant neoplasm of liver and intrahepatic bile duct: Secondary | ICD-10-CM | POA: Insufficient documentation

## 2021-12-14 LAB — CBC WITH DIFFERENTIAL (CANCER CENTER ONLY)
Abs Immature Granulocytes: 0.02 10*3/uL (ref 0.00–0.07)
Basophils Absolute: 0 10*3/uL (ref 0.0–0.1)
Basophils Relative: 1 %
Eosinophils Absolute: 0.1 10*3/uL (ref 0.0–0.5)
Eosinophils Relative: 3 %
HCT: 30.7 % — ABNORMAL LOW (ref 36.0–46.0)
Hemoglobin: 9.8 g/dL — ABNORMAL LOW (ref 12.0–15.0)
Immature Granulocytes: 0 %
Lymphocytes Relative: 29 %
Lymphs Abs: 1.5 10*3/uL (ref 0.7–4.0)
MCH: 30.4 pg (ref 26.0–34.0)
MCHC: 31.9 g/dL (ref 30.0–36.0)
MCV: 95.3 fL (ref 80.0–100.0)
Monocytes Absolute: 0.6 10*3/uL (ref 0.1–1.0)
Monocytes Relative: 12 %
Neutro Abs: 2.9 10*3/uL (ref 1.7–7.7)
Neutrophils Relative %: 55 %
Platelet Count: 279 10*3/uL (ref 150–400)
RBC: 3.22 MIL/uL — ABNORMAL LOW (ref 3.87–5.11)
RDW: 15.9 % — ABNORMAL HIGH (ref 11.5–15.5)
WBC Count: 5.1 10*3/uL (ref 4.0–10.5)
nRBC: 0 % (ref 0.0–0.2)

## 2021-12-14 LAB — CMP (CANCER CENTER ONLY)
ALT: 11 U/L (ref 0–44)
AST: 20 U/L (ref 15–41)
Albumin: 4 g/dL (ref 3.5–5.0)
Alkaline Phosphatase: 54 U/L (ref 38–126)
Anion gap: 9 (ref 5–15)
BUN: 21 mg/dL (ref 8–23)
CO2: 27 mmol/L (ref 22–32)
Calcium: 9.9 mg/dL (ref 8.9–10.3)
Chloride: 101 mmol/L (ref 98–111)
Creatinine: 0.97 mg/dL (ref 0.44–1.00)
GFR, Estimated: 55 mL/min — ABNORMAL LOW (ref >60)
Glucose, Bld: 90 mg/dL (ref 70–99)
Potassium: 4.4 mmol/L (ref 3.5–5.1)
Sodium: 137 mmol/L (ref 135–145)
Total Bilirubin: 0.4 mg/dL (ref 0.3–1.2)
Total Protein: 7.1 g/dL (ref 6.5–8.1)

## 2021-12-14 LAB — TSH: TSH: 1.576 u[IU]/mL (ref 0.350–4.500)

## 2021-12-14 LAB — CEA (IN HOUSE-CHCC): CEA (CHCC-In House): 6.5 ng/mL — ABNORMAL HIGH (ref 0.00–5.00)

## 2021-12-14 LAB — LACTATE DEHYDROGENASE: LDH: 161 U/L (ref 98–192)

## 2021-12-14 MED ORDER — SODIUM CHLORIDE 0.9 % IV SOLN: Freq: Once | INTRAVENOUS | Status: AC

## 2021-12-14 MED ORDER — PROCHLORPERAZINE MALEATE 10 MG PO TABS
10.0000 mg | ORAL_TABLET | Freq: Once | ORAL | Status: AC
  Administered 2021-12-14: 10 mg via ORAL
  Filled 2021-12-14: qty 1

## 2021-12-14 MED ORDER — SODIUM CHLORIDE 0.9 % IV SOLN
1500.0000 mg | Freq: Once | INTRAVENOUS | Status: AC
  Administered 2021-12-14: 1500 mg via INTRAVENOUS
  Filled 2021-12-14: qty 30

## 2021-12-14 MED ORDER — SODIUM CHLORIDE 0.9 % IV SOLN
800.0000 mg/m2 | Freq: Once | INTRAVENOUS | Status: AC
  Administered 2021-12-14: 1254 mg via INTRAVENOUS
  Filled 2021-12-14: qty 26.3

## 2021-12-14 MED ORDER — SODIUM CHLORIDE 0.9 % IV SOLN: Freq: Once | INTRAVENOUS | Status: DC

## 2021-12-14 MED ORDER — SODIUM CHLORIDE 0.9% FLUSH
10.0000 mL | Freq: Once | INTRAVENOUS | Status: AC
  Administered 2021-12-14: 10 mL

## 2021-12-14 MED ORDER — HEPARIN SOD (PORK) LOCK FLUSH 100 UNIT/ML IV SOLN
500.0000 [IU] | Freq: Once | INTRAVENOUS | Status: AC | PRN
  Administered 2021-12-14: 500 [IU]

## 2021-12-14 MED ORDER — SODIUM CHLORIDE 0.9% FLUSH
10.0000 mL | INTRAVENOUS | Status: DC | PRN
  Administered 2021-12-14: 10 mL

## 2021-12-14 NOTE — Addendum Note (Signed)
Addended by: Burney Gauze R on: 12/14/2021 01:36 PM   Modules accepted: Orders

## 2021-12-14 NOTE — Patient Instructions (Signed)

## 2021-12-14 NOTE — Progress Notes (Signed)
Hematology and Oncology Follow Up Visit  Angelica Benson [id number] 05/04/1928 88 y.o. 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  As always, she is very spunky.  We had a good time playing [employer] Word.  This is always fun doing with her.  She does not have any problems with pain.  She does have some fatigue which is not surprising given her age and the fact that she is taking chemotherapy.  Overall, I really think she tolerated chemotherapy quite nicely.  She really has had no problems with diarrhea.  She has had no bleeding.  She has had no mouth sores.  There is been no fever.  Her tumor markers have been a little bit better.  The CA 19-9 went from 63 down to 51.  The CEA level is holding steady at 5.7.  She has had a little bit of leg swelling but this really is not much of a problem for her.  Overall, I would say performance status is probably ECOG 2.    Medications:  Current Outpatient Medications:    Accu-Chek FastClix Lancets MISC, USE AS DIRECTED TO CHECK  BLOOD SUGAR, Disp: 102 each, Rfl: 2   acetaminophen (TYLENOL) 500 MG tablet, Take 2 tablets (1,000 mg total) by mouth 2 (two) times daily as needed for moderate pain., Disp: 30 tablet, Rfl: 0   albuterol (VENTOLIN HFA) 108 (90 Base) MCG/ACT inhaler, USE 2 INHALATIONS BY MOUTH  EVERY 6 HOURS AS NEEDED FOR WHEEZING OR SHORTNESS OF  BREATH, Disp: 8 g, Rfl: 0   aspirin EC 81 MG tablet, Take 81 mg by mouth daily., Disp: , Rfl:    atorvastatin (LIPITOR) 10 MG tablet, TAKE 1 TABLET BY MOUTH  DAILY, Disp: 100 tablet, Rfl: 2   escitalopram (LEXAPRO) 5 MG tablet, TAKE 1 TABLET(5 MG) BY MOUTH DAILY, Disp: 90 tablet, Rfl: 0   fluticasone (FLONASE) 50 MCG/ACT nasal spray, USE 2 SPRAYS IN BOTH  NOSTRILS DAILY,  Disp: 48 g, Rfl: 0   glucose blood (ACCU-CHEK GUIDE) test strip, USE ONE TEST STRIP AS  INSTRUCTED, Disp: 100 strip, Rfl: 3   lisinopril (ZESTRIL) 10 MG tablet, TAKE 1 TABLET BY MOUTH DAILY, Disp: 100 tablet, Rfl: 2   metFORMIN (GLUCOPHAGE) 500 MG tablet, TAKE 1 TABLET BY MOUTH TWICE  DAILY WITH A MEAL, Disp: 180 tablet, Rfl: 3   mometasone-formoterol (DULERA) 200-5 MCG/ACT AERO, Inhale 1 puff into the lungs 2 (two) times daily., Disp: 13 g, Rfl: 5   Multiple Vitamins-Minerals (CENTRUM SILVER 50+WOMEN) TABS, Take 1 tablet by mouth daily., Disp: , Rfl:    omeprazole (PRILOSEC) 20 MG capsule, TAKE 1 CAPSULE BY MOUTH DAILY, Disp: 30 capsule, Rfl: 11   betamethasone dipropionate 0.05 % cream, Apply topically 2 (two) times daily. (Patient not taking: Reported on 11/22/2021), Disp: 30 g, Rfl: 0   nitroGLYCERIN (NITROSTAT) 0.4 MG SL tablet, DISSOLVE 1 TABLET UNDER THE TONGUE EVERY 5 MINUTES AS  NEEDED FOR CHEST PAIN. MAX  OF 3 TABLETS IN 15 MINUTES. CALL 911 IF PAIN PERSISTS. (Patient not taking: Reported on 09/12/2021), Disp: 25 tablet, Rfl: 1 No current facility-administered medications for this visit.  Facility-Administered Medications Ordered in Other Visits:    prochlorperazine (COMPAZINE) 10 MG tablet, , , ,    sodium chloride flush (NS) 0.9 %  injection 10 mL, 10 mL, Intravenous, PRN, Celso Amy, NP, 10 mL at 11/29/21 1244  Allergies:  Allergies  Allergen Reactions   Buprenorphine Hcl Rash   Morphine And Related Rash    Past Medical History, Surgical history, Social history, and Family History were reviewed and updated.  Review of Systems: Review of Systems  Constitutional:  Positive for fatigue and unexpected weight change.  HENT:  Negative.    Eyes: Negative.   Respiratory: Negative.    Cardiovascular: Negative.   Gastrointestinal:  Positive for abdominal pain.  Endocrine: Negative.   Musculoskeletal: Negative.   Skin: Negative.   Neurological: Negative.   Hematological:  Negative.   Psychiatric/Behavioral: Negative.      Physical Exam:  height is _0  (1.549 m) and weight is 128 lb 1.3 oz (58.1 kg). Her oral temperature is 98 F (36.7 C). Her blood pressure is 167/45 (abnormal) and her pulse is 59 (abnormal). Her respiration is 20 and oxygen saturation is 100%.   Wt Readings from Last 3 Encounters:  12/14/21 128 lb 1.3 oz (58.1 kg)  11/22/21 130 lb (59 kg)  11/10/21 130 lb (59 kg)    Physical Exam Vitals reviewed.  HENT:     Head: Normocephalic and atraumatic.  Eyes:     Pupils: Pupils are equal, round, and reactive to light.  Cardiovascular:     Rate and Rhythm: Normal rate and regular rhythm.     Heart sounds: Normal heart sounds.  Pulmonary:     Effort: Pulmonary effort is normal.     Breath sounds: Normal breath sounds.  Abdominal:     General: Bowel sounds are normal.     Palpations: Abdomen is soft.  Musculoskeletal:        General: No tenderness or deformity. Normal range of motion.     Cervical back: Normal range of motion.  Lymphadenopathy:     Cervical: No cervical adenopathy.  Skin:    General: Skin is warm and dry.     Findings: No erythema or rash.  Neurological:     Mental Status: She is alert and oriented to person, place, and time.  Psychiatric:        Behavior: Behavior normal.        Thought Content: Thought content normal.        Judgment: Judgment normal.     Lab Results  Component Value Date   WBC 5.1 12/14/2021   HGB 9.8 (L) 12/14/2021   HCT 30.7 (L) 12/14/2021   MCV 95.3 12/14/2021   PLT 279 12/14/2021     Chemistry      Component Value Date/Time   NA 137 12/14/2021 1133   K 4.4 12/14/2021 1133   CL 101 12/14/2021 1133   CO2 27 12/14/2021 1133   BUN 21 12/14/2021 1133   CREATININE 0.97 12/14/2021 1133   CREATININE 0.92 06/29/2021 1450      Component Value Date/Time   CALCIUM 9.9 12/14/2021 1133   ALKPHOS 54 12/14/2021 1133   AST 20 12/14/2021 1133   ALT 11 12/14/2021 1133   BILITOT 0.4  12/14/2021 1133      Impression and Plan: Angelica Benson is a very charming 88 year old African-American female.  She has a poorly differentiated adenocarcinoma.  Again I suspect that this is can be a pancreaticobiliary primary.  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  As such, we can certainly consider this as second line therapy.  We will go ahead with the third cycle of  treatment.  After this third cycle, we will plan for follow-up scans and let see how she feels.  Again, this is all about quality of life.  I does want Angelica Benson's quality of life to be as good as possible while we are trying to treat her.  So far, I think we have done pretty well with her attaining a decent quality of life.   Volanda Napoleon, MD 9/1/20231:02 PM

## 2021-12-14 NOTE — Patient Instructions (Signed)
[facility]  Discharge Instructions: Thank you for choosing [facility] to provide your oncology and hematology care.   If you have a lab appointment with the Cancer Center, please go directly to the Cancer Center and check in at the registration area.  Wear comfortable clothing and clothing appropriate for easy access to any Portacath or PICC line.   We strive to give you quality time with your provider. You may need to reschedule your appointment if you arrive late (15 or more minutes).  Arriving late affects you and other patients whose appointments are after yours.  Also, if you miss three or more appointments without notifying the office, you may be dismissed from the clinic at the provider's discretion.      For prescription refill requests, have your pharmacy contact our office and allow 72 hours for refills to be completed.    Today you received the following chemotherapy and/or immunotherapy agents Gemzar      To help prevent nausea and vomiting after your treatment, we encourage you to take your nausea medication as directed.  BELOW ARE SYMPTOMS THAT SHOULD BE REPORTED IMMEDIATELY: *FEVER GREATER THAN 100.4 F (38 C) OR HIGHER *CHILLS OR SWEATING *NAUSEA AND VOMITING THAT IS NOT CONTROLLED WITH YOUR NAUSEA MEDICATION *UNUSUAL SHORTNESS OF BREATH *UNUSUAL BRUISING OR BLEEDING *URINARY PROBLEMS (pain or burning when urinating, or frequent urination) *BOWEL PROBLEMS (unusual diarrhea, constipation, pain near the anus) TENDERNESS IN MOUTH AND THROAT WITH OR WITHOUT PRESENCE OF ULCERS (sore throat, sores in mouth, or a toothache) UNUSUAL RASH, SWELLING OR PAIN  UNUSUAL VAGINAL DISCHARGE OR ITCHING   Items with * indicate a potential emergency and should be followed up as soon as possible or go to the Emergency Department if any problems should occur.  Please show the CHEMOTHERAPY ALERT CARD or IMMUNOTHERAPY ALERT CARD at check-in to the  Emergency Department and triage nurse. Should you have questions after your visit or need to cancel or reschedule your appointment, please contact [facility]  [phone] and follow the prompts.  Office hours are 8:00 a.m. to 4:30 p.m. Monday - Friday. Please note that voicemails left after 4:00 p.m. may not be returned until the following business day.  We are closed weekends and major holidays. You have access to a nurse at all times for urgent questions. Please call the main number to the clinic [phone] and follow the prompts.  For any non-urgent questions, you may also contact your provider using MyChart. We now offer e-Visits for anyone 18 and older to request care online for non-urgent symptoms. For details visit [email or web address].   Also download the MyChart app! Go to the app store, search "MyChart", open the app, select [facility], and log in with your MyChart username and password.  Masks are optional in the cancer centers. If you would like for your care team to wear a mask while they are taking care of you, please let them know. You may have one support person who is at least 86 years old accompany you for your appointments. 

## 2021-12-15 ENCOUNTER — Other Ambulatory Visit: Payer: Self-pay

## 2021-12-15 LAB — T4: T4, Total: 7.7 ug/dL (ref 4.5–12.0)

## 2021-12-15 LAB — CANCER ANTIGEN 19-9: CA 19-9: 53 U/mL — ABNORMAL HIGH (ref 0–35)

## 2021-12-18 ENCOUNTER — Encounter: Payer: Self-pay | Admitting: Family

## 2021-12-18 MED ORDER — BETAMETHASONE DIPROPIONATE 0.05 % EX CREA: TOPICAL_CREAM | Freq: Two times a day (BID) | CUTANEOUS | 0 refills | Status: DC

## 2021-12-18 MED ORDER — FLUTICASONE PROPIONATE 50 MCG/ACT NA SUSP: NASAL | 1 refills | Status: DC

## 2021-12-18 MED ORDER — MOMETASONE FURO-FORMOTEROL FUM 200-5 MCG/ACT IN AERO: 1.0000 | INHALATION_SPRAY | Freq: Two times a day (BID) | RESPIRATORY_TRACT | 1 refills | Status: DC

## 2021-12-20 ENCOUNTER — Encounter (HOSPITAL_COMMUNITY): Payer: Self-pay

## 2021-12-20 ENCOUNTER — Emergency Department (HOSPITAL_COMMUNITY)
Admission: EM | Admit: 2021-12-20 | Discharge: 2021-12-20 | Disposition: A | Discharge status: Home / Self Care | Payer: Medicare Other | Attending: Emergency Medicine | Admitting: Emergency Medicine

## 2021-12-20 ENCOUNTER — Other Ambulatory Visit: Payer: Self-pay

## 2021-12-20 ENCOUNTER — Ambulatory Visit: Payer: Medicare Other

## 2021-12-20 DIAGNOSIS — R6889 Other general symptoms and signs: Secondary | ICD-10-CM | POA: Diagnosis not present

## 2021-12-20 DIAGNOSIS — I10 Essential (primary) hypertension: Secondary | ICD-10-CM | POA: Diagnosis not present

## 2021-12-20 DIAGNOSIS — R55 Syncope and collapse: Secondary | ICD-10-CM | POA: Insufficient documentation

## 2021-12-20 DIAGNOSIS — I509 Heart failure, unspecified: Secondary | ICD-10-CM | POA: Diagnosis not present

## 2021-12-20 DIAGNOSIS — I110 Hypertensive heart disease with heart failure: Secondary | ICD-10-CM | POA: Insufficient documentation

## 2021-12-20 DIAGNOSIS — R42 Dizziness and giddiness: Secondary | ICD-10-CM | POA: Diagnosis not present

## 2021-12-20 DIAGNOSIS — R410 Disorientation, unspecified: Secondary | ICD-10-CM | POA: Diagnosis not present

## 2021-12-20 DIAGNOSIS — R531 Weakness: Secondary | ICD-10-CM | POA: Diagnosis not present

## 2021-12-20 DIAGNOSIS — Z7982 Long term (current) use of aspirin: Secondary | ICD-10-CM | POA: Insufficient documentation

## 2021-12-20 DIAGNOSIS — Z743 Need for continuous supervision: Secondary | ICD-10-CM | POA: Diagnosis not present

## 2021-12-20 DIAGNOSIS — R404 Transient alteration of awareness: Secondary | ICD-10-CM | POA: Diagnosis not present

## 2021-12-20 LAB — BASIC METABOLIC PANEL
Anion gap: 7 (ref 5–15)
BUN: 21 mg/dL (ref 8–23)
CO2: 28 mmol/L (ref 22–32)
Calcium: 9.5 mg/dL (ref 8.9–10.3)
Chloride: 105 mmol/L (ref 98–111)
Creatinine, Ser: 0.98 mg/dL (ref 0.44–1.00)
GFR, Estimated: 54 mL/min — ABNORMAL LOW (ref >60)
Glucose, Bld: 100 mg/dL — ABNORMAL HIGH (ref 70–99)
Potassium: 5.2 mmol/L — ABNORMAL HIGH (ref 3.5–5.1)
Sodium: 140 mmol/L (ref 135–145)

## 2021-12-20 LAB — CBC WITH DIFFERENTIAL/PLATELET
Abs Immature Granulocytes: 0.01 10*3/uL (ref 0.00–0.07)
Basophils Absolute: 0.1 10*3/uL (ref 0.0–0.1)
Basophils Relative: 1 %
Eosinophils Absolute: 0.1 10*3/uL (ref 0.0–0.5)
Eosinophils Relative: 2 %
HCT: 29.5 % — ABNORMAL LOW (ref 36.0–46.0)
Hemoglobin: 9.2 g/dL — ABNORMAL LOW (ref 12.0–15.0)
Immature Granulocytes: 0 %
Lymphocytes Relative: 32 %
Lymphs Abs: 1.3 10*3/uL (ref 0.7–4.0)
MCH: 30.9 pg (ref 26.0–34.0)
MCHC: 31.2 g/dL (ref 30.0–36.0)
MCV: 99 fL (ref 80.0–100.0)
Monocytes Absolute: 0.2 10*3/uL (ref 0.1–1.0)
Monocytes Relative: 4 %
Neutro Abs: 2.3 10*3/uL (ref 1.7–7.7)
Neutrophils Relative %: 61 %
Platelets: 189 10*3/uL (ref 150–400)
RBC: 2.98 MIL/uL — ABNORMAL LOW (ref 3.87–5.11)
RDW: 15.5 % (ref 11.5–15.5)
WBC: 3.9 10*3/uL — ABNORMAL LOW (ref 4.0–10.5)
nRBC: 0 % (ref 0.0–0.2)

## 2021-12-20 LAB — TROPONIN I (HIGH SENSITIVITY): Troponin I (High Sensitivity): 4 ng/L (ref <18)

## 2021-12-20 MED ORDER — SODIUM CHLORIDE 0.9 % IV BOLUS
500.0000 mL | Freq: Once | INTRAVENOUS | Status: AC
  Administered 2021-12-20: 500 mL via INTRAVENOUS

## 2021-12-20 NOTE — ED Provider Notes (Signed)
[facility] DEPT Provider Note   CSN: [id number] Arrival date & time: 12/20/21  0940     History  Chief Complaint  Patient presents with   Near Syncope   Dizziness    Angelica Benson is a 88 y.o. female.  HPI    87 year old female comes in with chief complaint of dizziness. Patient has history of pancreatic cancer with mets to the liver.  She is undergoing chemotherapy.  She also has CHF, hypertension, diabetes and asthma.  She is accompanied by her daughter at the bedside.  According to the patient, she was walking to the bathroom when she started feeling dizzy.  Dizziness is described as lightheadedness.  She felt like she was going to fall, she called for help and was taken to the bedside.  Given her symptoms, she was sent to the emergency room.  She denies any associated chest pain, palpitations, shortness of breath, spinning sensation.  Patient denies any vision change.  Now she feels better.  [name] indicates that patient has had some weight loss, but her appetite has been otherwise fine.  No recent illnesses.  Home Medications Prior to Admission medications   Medication Sig Start Date End Date Taking? Authorizing Provider  Accu-Chek FastClix Lancets MISC USE AS DIRECTED TO CHECK  BLOOD SUGAR 09/16/21   Debbrah Alar, NP  acetaminophen (TYLENOL) 500 MG tablet Take 2 tablets (1,000 mg total) by mouth 2 (two) times daily as needed for moderate pain. 08/15/17   Debbrah Alar, NP  albuterol (VENTOLIN HFA) 108 (90 Base) MCG/ACT inhaler USE 2 INHALATIONS BY MOUTH  EVERY 6 HOURS AS NEEDED FOR WHEEZING OR SHORTNESS OF  BREATH 06/01/21   Debbrah Alar, NP  aspirin EC 81 MG tablet Take 81 mg by mouth daily.    [provider]  atorvastatin (LIPITOR) 10 MG tablet TAKE 1 TABLET BY MOUTH  DAILY 08/27/21   Debbrah Alar, NP  betamethasone dipropionate 0.05 % cream Apply topically 2 (two) times daily. 12/18/21   Debbrah Alar, NP  escitalopram (LEXAPRO) 5 MG tablet TAKE 1 TABLET(5 MG) BY MOUTH DAILY 11/13/21   Debbrah Alar, NP  fluticasone [facility]) 50 MCG/ACT nasal spray USE 2 SPRAYS IN BOTH  NOSTRILS DAILY 12/18/21   Debbrah Alar, NP  glucose blood (ACCU-CHEK GUIDE) test strip USE ONE TEST STRIP AS  INSTRUCTED 03/09/21   Debbrah Alar, NP  lisinopril (ZESTRIL) 10 MG tablet TAKE 1 TABLET BY MOUTH DAILY 09/03/21   Debbrah Alar, NP  metFORMIN (GLUCOPHAGE) 500 MG tablet TAKE 1 TABLET BY MOUTH TWICE  DAILY WITH A MEAL 10/01/21   Debbrah Alar, NP  mometasone-formoterol (DULERA) 200-5 MCG/ACT AERO Inhale 1 puff into the lungs 2 (two) times daily. 12/18/21   Debbrah Alar, NP  Multiple Vitamins-Minerals (CENTRUM SILVER 50+WOMEN) TABS Take 1 tablet by mouth daily. 04/28/18   Debbrah Alar, NP  nitroGLYCERIN (NITROSTAT) 0.4 MG SL tablet DISSOLVE 1 TABLET UNDER THE TONGUE EVERY 5 MINUTES AS  NEEDED FOR CHEST PAIN. MAX  OF 3 TABLETS IN 15 MINUTES. CALL 911 IF PAIN PERSISTS. Patient not taking: Reported on 09/12/2021 07/24/21   Debbrah Alar, NP  omeprazole (PRILOSEC) 20 MG capsule TAKE 1 CAPSULE BY MOUTH DAILY 07/16/21   Debbrah Alar, NP  prochlorperazine (COMPAZINE) 10 MG tablet Take 1 tablet (10 mg total) by mouth every 6 (six) hours as needed (Nausea or vomiting). Patient not taking: Reported on 11/22/2021 10/29/21 11/29/21  Volanda Napoleon, MD      Allergies  Buprenorphine hcl and Morphine and related    Review of Systems   Review of Systems  All other systems reviewed and are negative.   Physical Exam Updated Vital Signs BP (!) 166/62   Pulse 65   Temp 97.8 F (36.6 C) (Oral)   Resp 17   SpO2 100%  Physical Exam Vitals and nursing note reviewed.  Constitutional:      Appearance: She is well-developed.  HENT:     Head: Atraumatic.  Eyes:     Extraocular Movements: Extraocular movements intact.     Pupils: Pupils are equal, round, and reactive to light.      Comments: No nystagmus, no dysmetria  Cardiovascular:     Rate and Rhythm: Normal rate.  Pulmonary:     Effort: Pulmonary effort is normal.  Musculoskeletal:     Cervical back: Normal range of motion and neck supple.  Skin:    General: Skin is warm and dry.  Neurological:     Mental Status: She is alert and oriented to person, place, and time.     Cranial Nerves: No cranial nerve deficit.     Sensory: No sensory deficit.     Motor: No weakness.     ED Results / Procedures / Treatments   Labs (all labs ordered are listed, but only abnormal results are displayed) Labs Reviewed  BASIC METABOLIC PANEL - Abnormal; Notable for the following components:      Result Value   Potassium 5.2 (*)    Glucose, Bld 100 (*)    GFR, Estimated 54 (*)    All other components within normal limits  CBC WITH DIFFERENTIAL/PLATELET - Abnormal; Notable for the following components:   WBC 3.9 (*)    RBC 2.98 (*)    Hemoglobin 9.2 (*)    HCT 29.5 (*)    All other components within normal limits  TROPONIN I (HIGH SENSITIVITY)    EKG EKG Interpretation  Date/Time:  Thursday December 20 2021 09:52:17 EDT Ventricular Rate:  64 PR Interval:  222 QRS Duration: 143 QT Interval:  408 QTC Calculation: 421 R Axis:   -72 Text Interpretation: Sinus rhythm Prolonged PR interval RBBB and LAFB No acute changes No significant change since last tracing Confirmed by Varney Biles [phone]) on 12/20/2021 3:09:47 PM  Radiology No results found.  Procedures Procedures    Medications Ordered in ED Medications  sodium chloride 0.9 % bolus 500 mL (0 mLs Intravenous Stopped 12/20/21 1359)    ED Course/ Medical Decision Making/ A&P                           Medical Decision Making Amount and/or Complexity of Data Reviewed Labs: ordered. ECG/medicine tests: ordered.   This patient presents to the ED with chief complaint(s) of dizziness, weakness with pertinent past medical history of pancreatic  cancer with liver mets and ongoing chemotherapy, CHF, hypertension.The complaint involves an extensive differential diagnosis and also carries with it a high risk of complications and morbidity.    The differential diagnosis includes : Orthostatic hypotension, dehydration, electrolyte abnormality, renal failure, chemo side effects, cancer related weakness.  Patient has no focal neurodeficits.  It does not appear that she had a stroke.  The initial plan is to get basic labs. On repeat assessment, patient mentioned that she did have an episode of chest pain earlier today.  Singular troponin will be added.   Additional history obtained: Additional history obtained from family Records  reviewed previous admission documents -admitted to the hospital when she was found to have cancer.  Independent labs interpretation:  The following labs were independently interpreted: CBC, metabolic profile are reassuring.   Treatment and Reassessment: Results of the ER work-up discussed with the patient and the daughter.  Stable for discharge.  Daughter will take patient home. Strict ER return precautions have been discussed, and patient is agreeing with the plan and is comfortable with the workup done and the recommendations from the ER.   Final Clinical Impression(s) / ED Diagnoses Final diagnoses:  Dizziness    Rx / DC Orders ED Discharge Orders     None         Varney Biles, MD 12/20/21 1516

## 2021-12-20 NOTE — ED Triage Notes (Signed)
EMS report from home, Home aid called for near syncope after BM walking out of bathroom with assist this morning. Pt c/o dizziness, reported since resolved. CA Chemo Pt. Baseline memory deficit.  BP 162/54 HR 76 RR 16 Sp02 97 RA CBG 141

## 2021-12-20 NOTE — Discharge Instructions (Addendum)
The work-up in the emergency room including labs, heart enzymes and EKG is reassuring.  We gave you some hydration.  Please make sure you are eating and drinking well.  Return to the ER if your symptoms get worse.  Make sure you prevent falls.

## 2021-12-21 ENCOUNTER — Inpatient Hospital Stay: Payer: Medicare Other

## 2021-12-21 VITALS — BP 159/52 | HR 66 | Temp 98.0°F | Resp 17

## 2021-12-21 DIAGNOSIS — Z79899 Other long term (current) drug therapy: Secondary | ICD-10-CM | POA: Diagnosis not present

## 2021-12-21 DIAGNOSIS — C787 Secondary malignant neoplasm of liver and intrahepatic bile duct: Secondary | ICD-10-CM | POA: Diagnosis not present

## 2021-12-21 DIAGNOSIS — Z5112 Encounter for antineoplastic immunotherapy: Secondary | ICD-10-CM | POA: Diagnosis not present

## 2021-12-21 DIAGNOSIS — C259 Malignant neoplasm of pancreas, unspecified: Secondary | ICD-10-CM

## 2021-12-21 DIAGNOSIS — Z5111 Encounter for antineoplastic chemotherapy: Secondary | ICD-10-CM | POA: Diagnosis not present

## 2021-12-21 DIAGNOSIS — C801 Malignant (primary) neoplasm, unspecified: Secondary | ICD-10-CM | POA: Diagnosis not present

## 2021-12-21 DIAGNOSIS — R978 Other abnormal tumor markers: Secondary | ICD-10-CM | POA: Diagnosis not present

## 2021-12-21 MED ORDER — SODIUM CHLORIDE 0.9 % IV SOLN
800.0000 mg/m2 | Freq: Once | INTRAVENOUS | Status: AC
  Administered 2021-12-21: 1254 mg via INTRAVENOUS
  Filled 2021-12-21: qty 26.3

## 2021-12-21 MED ORDER — PROCHLORPERAZINE MALEATE 10 MG PO TABS
10.0000 mg | ORAL_TABLET | Freq: Once | ORAL | Status: AC
  Administered 2021-12-21: 10 mg via ORAL

## 2021-12-21 MED ORDER — SODIUM CHLORIDE 0.9 % IV SOLN: Freq: Once | INTRAVENOUS | Status: AC

## 2021-12-21 MED ORDER — HEPARIN SOD (PORK) LOCK FLUSH 100 UNIT/ML IV SOLN
500.0000 [IU] | Freq: Once | INTRAVENOUS | Status: AC | PRN
  Administered 2021-12-21: 500 [IU]

## 2021-12-21 MED ORDER — SODIUM CHLORIDE 0.9% FLUSH
10.0000 mL | INTRAVENOUS | Status: DC | PRN
  Administered 2021-12-21: 10 mL

## 2021-12-21 NOTE — Patient Instructions (Signed)
[facility]  Discharge Instructions: Thank you for choosing [facility] to provide your oncology and hematology care.   If you have a lab appointment with the Cancer Center, please go directly to the Cancer Center and check in at the registration area.  Wear comfortable clothing and clothing appropriate for easy access to any Portacath or PICC line.   We strive to give you quality time with your provider. You may need to reschedule your appointment if you arrive late (15 or more minutes).  Arriving late affects you and other patients whose appointments are after yours.  Also, if you miss three or more appointments without notifying the office, you may be dismissed from the clinic at the provider's discretion.      For prescription refill requests, have your pharmacy contact our office and allow 72 hours for refills to be completed.    Today you received the following chemotherapy and/or immunotherapy agents Velcade      To help prevent nausea and vomiting after your treatment, we encourage you to take your nausea medication as directed.  BELOW ARE SYMPTOMS THAT SHOULD BE REPORTED IMMEDIATELY: *FEVER GREATER THAN 100.4 F (38 C) OR HIGHER *CHILLS OR SWEATING *NAUSEA AND VOMITING THAT IS NOT CONTROLLED WITH YOUR NAUSEA MEDICATION *UNUSUAL SHORTNESS OF BREATH *UNUSUAL BRUISING OR BLEEDING *URINARY PROBLEMS (pain or burning when urinating, or frequent urination) *BOWEL PROBLEMS (unusual diarrhea, constipation, pain near the anus) TENDERNESS IN MOUTH AND THROAT WITH OR WITHOUT PRESENCE OF ULCERS (sore throat, sores in mouth, or a toothache) UNUSUAL RASH, SWELLING OR PAIN  UNUSUAL VAGINAL DISCHARGE OR ITCHING   Items with * indicate a potential emergency and should be followed up as soon as possible or go to the Emergency Department if any problems should occur.  Please show the CHEMOTHERAPY ALERT CARD or IMMUNOTHERAPY ALERT CARD at check-in to the  Emergency Department and triage nurse. Should you have questions after your visit or need to cancel or reschedule your appointment, please contact [facility]  [phone] and follow the prompts.  Office hours are 8:00 a.m. to 4:30 p.m. Monday - Friday. Please note that voicemails left after 4:00 p.m. may not be returned until the following business day.  We are closed weekends and major holidays. You have access to a nurse at all times for urgent questions. Please call the main number to the clinic [phone] and follow the prompts.  For any non-urgent questions, you may also contact your provider using MyChart. We now offer e-Visits for anyone 18 and older to request care online for non-urgent symptoms. For details visit [email or web address].   Also download the MyChart app! Go to the app store, search "MyChart", open the app, select [facility], and log in with your MyChart username and password.  Masks are optional in the cancer centers. If you would like for your care team to wear a mask while they are taking care of you, please let them know. You may have one support person who is at least 86 years old accompany you for your appointments. 

## 2021-12-21 NOTE — Progress Notes (Signed)
OK per Dr Marin Olp to use labs from 12/20/2021 ED visit, although BMET was drawn instead of CMET. dph

## 2021-12-28 ENCOUNTER — Encounter: Payer: Self-pay | Admitting: *Deleted

## 2021-12-28 NOTE — Progress Notes (Signed)
Patient needs an MRI prior to her next appointment. Scheduled for 01/07/2022.  Called and spoke to patient's daughter, Angelica Benson. She is aware of appointment date, time and location. She knows to arrive at 4:30p and for the patient to be NPO after 1:00p.  Radiology information sheet also mailed to patient's home for education reinforcement.   Oncology Nurse Navigator Documentation     12/28/2021    1:00 PM  Oncology Nurse Navigator Flowsheets  Navigator Follow Up Date: 01/07/2022  Navigator Follow Up Reason: Scan Review  Navigator Location [facility]  Navigator Encounter Type Telephone;Appt/Treatment Plan Review  Telephone Outgoing Call;Appt Confirmation/Clarification  Patient Visit Type MedOnc  Treatment Phase Active Tx  Barriers/Navigation Needs Coordination of Care;Education  Education Other  Interventions Coordination of Care;Education;Psycho-Social Support  Acuity Level 2-Minimal Needs (1-2 Barriers Identified)  Coordination of Care Radiology  Education Method Verbal;Written  Support Groups/Services Friends and Family  Time Spent with Patient 30

## 2021-12-31 ENCOUNTER — Encounter: Payer: Self-pay | Admitting: *Deleted

## 2022-01-07 ENCOUNTER — Ambulatory Visit (HOSPITAL_COMMUNITY)
Admission: RE | Admit: 2022-01-07 | Discharge: 2022-01-07 | Disposition: A | Discharge status: Home / Self Care | Payer: Medicare Other | Source: Ambulatory Visit | Attending: Hematology & Oncology | Admitting: Hematology & Oncology

## 2022-01-07 DIAGNOSIS — C259 Malignant neoplasm of pancreas, unspecified: Secondary | ICD-10-CM | POA: Diagnosis not present

## 2022-01-07 DIAGNOSIS — C787 Secondary malignant neoplasm of liver and intrahepatic bile duct: Secondary | ICD-10-CM | POA: Insufficient documentation

## 2022-01-07 DIAGNOSIS — I81 Portal vein thrombosis: Secondary | ICD-10-CM | POA: Diagnosis not present

## 2022-01-07 DIAGNOSIS — N281 Cyst of kidney, acquired: Secondary | ICD-10-CM | POA: Diagnosis not present

## 2022-01-07 DIAGNOSIS — K838 Other specified diseases of biliary tract: Secondary | ICD-10-CM | POA: Diagnosis not present

## 2022-01-07 DIAGNOSIS — C229 Malignant neoplasm of liver, not specified as primary or secondary: Secondary | ICD-10-CM | POA: Diagnosis not present

## 2022-01-07 MED ORDER — GADOPICLENOL 0.5 MMOL/ML IV SOLN
5.5000 mL | Freq: Once | INTRAVENOUS | Status: AC | PRN
  Administered 2022-01-07: 5.5 mL via INTRAVENOUS

## 2022-01-09 ENCOUNTER — Encounter: Payer: Self-pay | Admitting: *Deleted

## 2022-01-09 NOTE — Progress Notes (Signed)
Stable MRI  Oncology Nurse Navigator Documentation     01/09/2022    8:30 AM  Oncology Nurse Navigator Flowsheets  Navigator Follow Up Date: 01/11/2022  Navigator Follow Up Reason: Follow-up Appointment;Chemotherapy  Navigator Location [facility]  Navigator Encounter Type Scan Review  Patient Visit Type MedOnc  Treatment Phase Active Tx  Barriers/Navigation Needs Coordination of Care;Education  Interventions None Required  Acuity Level 2-Minimal Needs (1-2 Barriers Identified)  Support Groups/Services Friends and Family  Time Spent with Patient 15

## 2022-01-11 ENCOUNTER — Inpatient Hospital Stay: Payer: Medicare Other

## 2022-01-11 ENCOUNTER — Encounter: Payer: Self-pay | Admitting: *Deleted

## 2022-01-11 ENCOUNTER — Other Ambulatory Visit: Payer: Self-pay

## 2022-01-11 ENCOUNTER — Inpatient Hospital Stay: Payer: Medicare Other | Admitting: Hematology & Oncology

## 2022-01-11 ENCOUNTER — Encounter: Payer: Self-pay | Admitting: Hematology & Oncology

## 2022-01-11 ENCOUNTER — Inpatient Hospital Stay (HOSPITAL_BASED_OUTPATIENT_CLINIC_OR_DEPARTMENT_OTHER): Payer: Medicare Other | Admitting: Hematology & Oncology

## 2022-01-11 VITALS — BP 164/53 | HR 64 | Temp 98.0°F | Resp 18 | Ht 61.0 in | Wt 130.4 lb

## 2022-01-11 VITALS — BP 147/47 | HR 66 | Resp 17

## 2022-01-11 DIAGNOSIS — C259 Malignant neoplasm of pancreas, unspecified: Secondary | ICD-10-CM

## 2022-01-11 DIAGNOSIS — C787 Secondary malignant neoplasm of liver and intrahepatic bile duct: Secondary | ICD-10-CM | POA: Diagnosis not present

## 2022-01-11 DIAGNOSIS — Z5111 Encounter for antineoplastic chemotherapy: Secondary | ICD-10-CM | POA: Diagnosis not present

## 2022-01-11 DIAGNOSIS — R978 Other abnormal tumor markers: Secondary | ICD-10-CM | POA: Diagnosis not present

## 2022-01-11 DIAGNOSIS — Z5112 Encounter for antineoplastic immunotherapy: Secondary | ICD-10-CM | POA: Diagnosis not present

## 2022-01-11 DIAGNOSIS — C801 Malignant (primary) neoplasm, unspecified: Secondary | ICD-10-CM | POA: Diagnosis not present

## 2022-01-11 DIAGNOSIS — Z79899 Other long term (current) drug therapy: Secondary | ICD-10-CM | POA: Diagnosis not present

## 2022-01-11 LAB — CBC WITH DIFFERENTIAL (CANCER CENTER ONLY)
Abs Immature Granulocytes: 0.02 10*3/uL (ref 0.00–0.07)
Basophils Absolute: 0 10*3/uL (ref 0.0–0.1)
Basophils Relative: 1 %
Eosinophils Absolute: 0.1 10*3/uL (ref 0.0–0.5)
Eosinophils Relative: 2 %
HCT: 29.2 % — ABNORMAL LOW (ref 36.0–46.0)
Hemoglobin: 9.4 g/dL — ABNORMAL LOW (ref 12.0–15.0)
Immature Granulocytes: 0 %
Lymphocytes Relative: 25 %
Lymphs Abs: 1.4 10*3/uL (ref 0.7–4.0)
MCH: 31.1 pg (ref 26.0–34.0)
MCHC: 32.2 g/dL (ref 30.0–36.0)
MCV: 96.7 fL (ref 80.0–100.0)
Monocytes Absolute: 0.6 10*3/uL (ref 0.1–1.0)
Monocytes Relative: 11 %
Neutro Abs: 3.5 10*3/uL (ref 1.7–7.7)
Neutrophils Relative %: 61 %
Platelet Count: 499 10*3/uL — ABNORMAL HIGH (ref 150–400)
RBC: 3.02 MIL/uL — ABNORMAL LOW (ref 3.87–5.11)
RDW: 15.9 % — ABNORMAL HIGH (ref 11.5–15.5)
WBC Count: 5.6 10*3/uL (ref 4.0–10.5)
nRBC: 0 % (ref 0.0–0.2)

## 2022-01-11 LAB — CMP (CANCER CENTER ONLY)
ALT: 13 U/L (ref 0–44)
AST: 21 U/L (ref 15–41)
Albumin: 4.1 g/dL (ref 3.5–5.0)
Alkaline Phosphatase: 48 U/L (ref 38–126)
Anion gap: 8 (ref 5–15)
BUN: 30 mg/dL — ABNORMAL HIGH (ref 8–23)
CO2: 28 mmol/L (ref 22–32)
Calcium: 9.8 mg/dL (ref 8.9–10.3)
Chloride: 102 mmol/L (ref 98–111)
Creatinine: 0.91 mg/dL (ref 0.44–1.00)
GFR, Estimated: 60 mL/min — ABNORMAL LOW (ref >60)
Glucose, Bld: 121 mg/dL — ABNORMAL HIGH (ref 70–99)
Potassium: 4.4 mmol/L (ref 3.5–5.1)
Sodium: 138 mmol/L (ref 135–145)
Total Bilirubin: 0.3 mg/dL (ref 0.3–1.2)
Total Protein: 7 g/dL (ref 6.5–8.1)

## 2022-01-11 LAB — CEA (IN HOUSE-CHCC): CEA (CHCC-In House): 5.22 ng/mL — ABNORMAL HIGH (ref 0.00–5.00)

## 2022-01-11 LAB — PREALBUMIN: Prealbumin: 19 mg/dL (ref 18–38)

## 2022-01-11 MED ORDER — SODIUM CHLORIDE 0.9 % IV SOLN
1500.0000 mg | Freq: Once | INTRAVENOUS | Status: AC
  Administered 2022-01-11: 1500 mg via INTRAVENOUS
  Filled 2022-01-11: qty 30

## 2022-01-11 MED ORDER — SODIUM CHLORIDE 0.9 % IV SOLN
800.0000 mg/m2 | Freq: Once | INTRAVENOUS | Status: AC
  Administered 2022-01-11: 1254 mg via INTRAVENOUS
  Filled 2022-01-11: qty 26.3

## 2022-01-11 MED ORDER — HEPARIN SOD (PORK) LOCK FLUSH 100 UNIT/ML IV SOLN
500.0000 [IU] | Freq: Once | INTRAVENOUS | Status: AC | PRN
  Administered 2022-01-11: 500 [IU]

## 2022-01-11 MED ORDER — PROCHLORPERAZINE MALEATE 10 MG PO TABS
10.0000 mg | ORAL_TABLET | Freq: Once | ORAL | Status: AC
  Administered 2022-01-11: 10 mg via ORAL
  Filled 2022-01-11: qty 1

## 2022-01-11 MED ORDER — SODIUM CHLORIDE 0.9% FLUSH
10.0000 mL | INTRAVENOUS | Status: DC | PRN
  Administered 2022-01-11: 10 mL

## 2022-01-11 MED ORDER — SODIUM CHLORIDE 0.9 % IV SOLN: Freq: Once | INTRAVENOUS | Status: DC

## 2022-01-11 MED ORDER — SODIUM CHLORIDE 0.9 % IV SOLN: Freq: Once | INTRAVENOUS | Status: AC

## 2022-01-11 NOTE — Patient Instructions (Signed)

## 2022-01-11 NOTE — Patient Instructions (Signed)
[facility]  Discharge Instructions: Thank you for choosing [facility] to provide your oncology and hematology care.   If you have a lab appointment with the [facility], please go directly to the [facility] and check in at the registration area.  Wear comfortable clothing and clothing appropriate for easy access to any Portacath or PICC line.   We strive to give you quality time with your provider. You may need to reschedule your appointment if you arrive late (15 or more minutes).  Arriving late affects you and other patients whose appointments are after yours.  Also, if you miss three or more appointments without notifying the office, you may be dismissed from the clinic at the provider's discretion.      For prescription refill requests, have your pharmacy contact our office and allow 72 hours for refills to be completed.    Today you received the following chemotherapy and/or immunotherapy agents Gemzar and Imfinzi    To help prevent nausea and vomiting after your treatment, we encourage you to take your nausea medication as directed.  BELOW ARE SYMPTOMS THAT SHOULD BE REPORTED IMMEDIATELY: *FEVER GREATER THAN 100.4 F (38 C) OR HIGHER *CHILLS OR SWEATING *NAUSEA AND VOMITING THAT IS NOT CONTROLLED WITH YOUR NAUSEA MEDICATION *UNUSUAL SHORTNESS OF BREATH *UNUSUAL BRUISING OR BLEEDING *URINARY PROBLEMS (pain or burning when urinating, or frequent urination) *BOWEL PROBLEMS (unusual diarrhea, constipation, pain near the anus) TENDERNESS IN MOUTH AND THROAT WITH OR WITHOUT PRESENCE OF ULCERS (sore throat, sores in mouth, or a toothache) UNUSUAL RASH, SWELLING OR PAIN  UNUSUAL VAGINAL DISCHARGE OR ITCHING   Items with * indicate a potential emergency and should be followed up as soon as possible or go to the Emergency Department if any problems should occur.  Please show the CHEMOTHERAPY ALERT CARD or IMMUNOTHERAPY ALERT CARD at check-in  to the Emergency Department and triage nurse. Should you have questions after your visit or need to cancel or reschedule your appointment, please contact [facility]  [phone] and follow the prompts.  Office hours are 8:00 a.m. to 4:30 p.m. Monday - Friday. Please note that voicemails left after 4:00 p.m. may not be returned until the following business day.  We are closed weekends and major holidays. You have access to a nurse at all times for urgent questions. Please call the main number to the clinic [phone] and follow the prompts.  For any non-urgent questions, you may also contact your provider using MyChart. We now offer e-Visits for anyone 75 and older to request care online for non-urgent symptoms. For details visit [email or web address].   Also download the MyChart app! Go to the app store, search "MyChart", open the app, select [facility], and log in with your MyChart username and password.  Masks are optional in the cancer centers. If you would like for your care team to wear a mask while they are taking care of you, please let them know. You may have one support person who is at least 86 years old accompany you for your appointments.

## 2022-01-11 NOTE — Progress Notes (Signed)
Hematology and Oncology Follow Up Visit  Angelica Benson [id number] 09-Mar-1929 88 y.o. 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  She is doing pretty well.  She really has had no specific complaints since we saw her last.  I must say that despite her maturity, she really has done a very nice job with treatment.  We did do a MRI of the liver on 01/07/2022.  Everything looked stable.  The mass in the left lobe of the liver measures 6.9 x 5.5 cm.  This was unchanged in size.  There was no obvious adenopathy that was noted outside of the liver.  She is eating okay.  She is having no nausea or vomiting.  She does have no change in bowel or bladder habits.  She has had no rashes.  There is been no leg swelling.  She has had no fever.  There is no cough or shortness of breath.  Overall, I would say her performance status is ECOG 2.    Medications:  Current Outpatient Medications:    Accu-Chek FastClix Lancets MISC, USE AS DIRECTED TO CHECK  BLOOD SUGAR, Disp: 102 each, Rfl: 2   acetaminophen (TYLENOL) 500 MG tablet, Take 2 tablets (1,000 mg total) by mouth 2 (two) times daily as needed for moderate pain., Disp: 30 tablet, Rfl: 0   albuterol (VENTOLIN HFA) 108 (90 Base) MCG/ACT inhaler, USE 2 INHALATIONS BY MOUTH  EVERY 6 HOURS AS NEEDED FOR WHEEZING OR SHORTNESS OF  BREATH, Disp: 8 g, Rfl: 0   aspirin EC 81 MG tablet, Take 81 mg by mouth daily., Disp: , Rfl:    atorvastatin (LIPITOR) 10 MG tablet, TAKE 1 TABLET BY MOUTH  DAILY, Disp: 100 tablet, Rfl: 2   betamethasone dipropionate 0.05 % cream, Apply topically 2 (two) times daily., Disp: 30 g, Rfl: 0   escitalopram (LEXAPRO) 5 MG tablet, TAKE 1 TABLET(5 MG) BY MOUTH DAILY, Disp: 90 tablet, Rfl: 0   fluticasone  (FLONASE) 50 MCG/ACT nasal spray, USE 2 SPRAYS IN BOTH  NOSTRILS DAILY, Disp: 48 g, Rfl: 1   glucose blood (ACCU-CHEK GUIDE) test strip, USE ONE TEST STRIP AS  INSTRUCTED, Disp: 100 strip, Rfl: 3   lisinopril (ZESTRIL) 10 MG tablet, TAKE 1 TABLET BY MOUTH DAILY, Disp: 100 tablet, Rfl: 2   metFORMIN (GLUCOPHAGE) 500 MG tablet, TAKE 1 TABLET BY MOUTH TWICE  DAILY WITH A MEAL, Disp: 180 tablet, Rfl: 3   mometasone-formoterol (DULERA) 200-5 MCG/ACT AERO, Inhale 1 puff into the lungs 2 (two) times daily., Disp: 39 g, Rfl: 1   Multiple Vitamins-Minerals (CENTRUM SILVER 50+WOMEN) TABS, Take 1 tablet by mouth daily., Disp: , Rfl:    omeprazole (PRILOSEC) 20 MG capsule, TAKE 1 CAPSULE BY MOUTH DAILY, Disp: 30 capsule, Rfl: 11   nitroGLYCERIN (NITROSTAT) 0.4 MG SL tablet, DISSOLVE 1 TABLET UNDER THE TONGUE EVERY 5 MINUTES AS  NEEDED FOR CHEST PAIN. MAX  OF 3 TABLETS IN 15 MINUTES. CALL 911 IF PAIN PERSISTS. (Patient not taking: Reported on 09/12/2021), Disp: 25 tablet, Rfl: 1 No current facility-administered medications for this visit.  Facility-Administered Medications Ordered in Other Visits:    prochlorperazine (COMPAZINE) 10 MG tablet, , , ,    sodium chloride flush (NS) 0.9 % injection 10 mL, 10 mL, Intravenous, PRN, Celso Amy, NP,  10 mL at 11/29/21 1244  Allergies:  Allergies  Allergen Reactions   Buprenorphine Hcl Rash   Morphine And Related Rash    Past Medical History, Surgical history, Social history, and Family History were reviewed and updated.  Review of Systems: Review of Systems  Constitutional:  Positive for fatigue and unexpected weight change.  HENT:  Negative.    Eyes: Negative.   Respiratory: Negative.    Cardiovascular: Negative.   Gastrointestinal:  Positive for abdominal pain.  Endocrine: Negative.   Musculoskeletal: Negative.   Skin: Negative.   Neurological: Negative.   Hematological: Negative.   Psychiatric/Behavioral: Negative.      Physical Exam:  height  is _0  (1.549 m) and weight is 130 lb 6.4 oz (59.1 kg). Her oral temperature is 98 F (36.7 C). Her blood pressure is 164/53 (abnormal) and her pulse is 64. Her respiration is 18 and oxygen saturation is 96%.   Wt Readings from Last 3 Encounters:  01/11/22 130 lb 6.4 oz (59.1 kg)  12/14/21 128 lb 1.3 oz (58.1 kg)  11/22/21 130 lb (59 kg)    Physical Exam Vitals reviewed.  HENT:     Head: Normocephalic and atraumatic.  Eyes:     Pupils: Pupils are equal, round, and reactive to light.  Cardiovascular:     Rate and Rhythm: Normal rate and regular rhythm.     Heart sounds: Normal heart sounds.  Pulmonary:     Effort: Pulmonary effort is normal.     Breath sounds: Normal breath sounds.  Abdominal:     General: Bowel sounds are normal.     Palpations: Abdomen is soft.  Musculoskeletal:        General: No tenderness or deformity. Normal range of motion.     Cervical back: Normal range of motion.  Lymphadenopathy:     Cervical: No cervical adenopathy.  Skin:    General: Skin is warm and dry.     Findings: No erythema or rash.  Neurological:     Mental Status: She is alert and oriented to person, place, and time.  Psychiatric:        Behavior: Behavior normal.        Thought Content: Thought content normal.        Judgment: Judgment normal.     Lab Results  Component Value Date   WBC 5.6 01/11/2022   HGB 9.4 (L) 01/11/2022   HCT 29.2 (L) 01/11/2022   MCV 96.7 01/11/2022   PLT 499 (H) 01/11/2022     Chemistry      Component Value Date/Time   NA 140 12/20/2021 1055   K 5.2 (H) 12/20/2021 1055   CL 105 12/20/2021 1055   CO2 28 12/20/2021 1055   BUN 21 12/20/2021 1055   CREATININE 0.98 12/20/2021 1055   CREATININE 0.97 12/14/2021 1133   CREATININE 0.92 06/29/2021 1450      Component Value Date/Time   CALCIUM 9.5 12/20/2021 1055   ALKPHOS 54 12/14/2021 1133   AST 20 12/14/2021 1133   ALT 11 12/14/2021 1133   BILITOT 0.4 12/14/2021 1133      Impression and  Plan: Angelica Benson is a very charming 88 year old African-American female.  She has a poorly differentiated adenocarcinoma.  Again I suspect that this is can be a pancreaticobiliary primary.  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  As such, we can certainly consider this as second line therapy.  I am happy that the MRI showed everything was stable.  She has tolerated her treatment incredibly well.  As such, I think we need to continue her on her current treatment with the gemcitabine/Durvalumab.  I would plan for 3 more cycles of treatment and then repeat her MRI.  We will plan to get her back to see her in another month.   Volanda Napoleon, MD 9/29/202312:16 PM

## 2022-01-11 NOTE — Progress Notes (Unsigned)
Patient has tolerated her treatment exceptionally well. Her most recent imaging shows stable disease. She will continue with treatment today.   Oncology Nurse Navigator Documentation     01/11/2022   12:45 PM  Oncology Nurse Navigator Flowsheets  Navigator Follow Up Date: 02/07/2022  Navigator Follow Up Reason: Follow-up Appointment;Chemotherapy  Navigator Location [facility]  Navigator Encounter Type Treatment  Patient Visit Type MedOnc  Treatment Phase Active Tx  Barriers/Navigation Needs No Barriers At This Time  Interventions Psycho-Social Support  Acuity Level 1-No Barriers  Support Groups/Services Friends and Family  Time Spent with Patient 15

## 2022-01-12 LAB — CANCER ANTIGEN 19-9: CA 19-9: 57 U/mL — ABNORMAL HIGH (ref 0–35)

## 2022-01-14 ENCOUNTER — Other Ambulatory Visit: Payer: Self-pay | Admitting: Family

## 2022-01-14 DIAGNOSIS — E119 Type 2 diabetes mellitus without complications: Secondary | ICD-10-CM

## 2022-01-14 DIAGNOSIS — F32A Depression, unspecified: Secondary | ICD-10-CM

## 2022-01-15 ENCOUNTER — Encounter: Payer: Self-pay | Admitting: Hematology & Oncology

## 2022-01-17 ENCOUNTER — Other Ambulatory Visit: Payer: Self-pay

## 2022-01-17 DIAGNOSIS — E119 Type 2 diabetes mellitus without complications: Secondary | ICD-10-CM

## 2022-01-17 MED ORDER — ACCU-CHEK GUIDE VI STRP: ORAL_STRIP | 12 refills | Status: DC

## 2022-01-18 ENCOUNTER — Inpatient Hospital Stay: Payer: Medicare Other

## 2022-01-18 ENCOUNTER — Inpatient Hospital Stay: Payer: Medicare Other | Attending: Hematology & Oncology

## 2022-01-18 DIAGNOSIS — Z7982 Long term (current) use of aspirin: Secondary | ICD-10-CM | POA: Diagnosis not present

## 2022-01-18 DIAGNOSIS — C801 Malignant (primary) neoplasm, unspecified: Secondary | ICD-10-CM | POA: Insufficient documentation

## 2022-01-18 DIAGNOSIS — Z5111 Encounter for antineoplastic chemotherapy: Secondary | ICD-10-CM | POA: Diagnosis not present

## 2022-01-18 DIAGNOSIS — Z7984 Long term (current) use of oral hypoglycemic drugs: Secondary | ICD-10-CM | POA: Diagnosis not present

## 2022-01-18 DIAGNOSIS — Z79899 Other long term (current) drug therapy: Secondary | ICD-10-CM | POA: Diagnosis not present

## 2022-01-18 DIAGNOSIS — Z7951 Long term (current) use of inhaled steroids: Secondary | ICD-10-CM | POA: Insufficient documentation

## 2022-01-18 DIAGNOSIS — C787 Secondary malignant neoplasm of liver and intrahepatic bile duct: Secondary | ICD-10-CM | POA: Insufficient documentation

## 2022-01-18 DIAGNOSIS — Z5112 Encounter for antineoplastic immunotherapy: Secondary | ICD-10-CM | POA: Diagnosis not present

## 2022-01-18 LAB — CBC WITH DIFFERENTIAL (CANCER CENTER ONLY)
Abs Immature Granulocytes: 0.01 10*3/uL (ref 0.00–0.07)
Basophils Absolute: 0 10*3/uL (ref 0.0–0.1)
Basophils Relative: 1 %
Eosinophils Absolute: 0 10*3/uL (ref 0.0–0.5)
Eosinophils Relative: 0 %
HCT: 28.6 % — ABNORMAL LOW (ref 36.0–46.0)
Hemoglobin: 9.1 g/dL — ABNORMAL LOW (ref 12.0–15.0)
Immature Granulocytes: 0 %
Lymphocytes Relative: 31 %
Lymphs Abs: 1.1 10*3/uL (ref 0.7–4.0)
MCH: 31 pg (ref 26.0–34.0)
MCHC: 31.8 g/dL (ref 30.0–36.0)
MCV: 97.3 fL (ref 80.0–100.0)
Monocytes Absolute: 0.3 10*3/uL (ref 0.1–1.0)
Monocytes Relative: 8 %
Neutro Abs: 2.1 10*3/uL (ref 1.7–7.7)
Neutrophils Relative %: 60 %
Platelet Count: 262 10*3/uL (ref 150–400)
RBC: 2.94 MIL/uL — ABNORMAL LOW (ref 3.87–5.11)
RDW: 14.9 % (ref 11.5–15.5)
WBC Count: 3.5 10*3/uL — ABNORMAL LOW (ref 4.0–10.5)
nRBC: 0 % (ref 0.0–0.2)

## 2022-01-18 LAB — CMP (CANCER CENTER ONLY)
ALT: 13 U/L (ref 0–44)
AST: 20 U/L (ref 15–41)
Albumin: 4.1 g/dL (ref 3.5–5.0)
Alkaline Phosphatase: 53 U/L (ref 38–126)
Anion gap: 7 (ref 5–15)
BUN: 34 mg/dL — ABNORMAL HIGH (ref 8–23)
CO2: 29 mmol/L (ref 22–32)
Calcium: 9.9 mg/dL (ref 8.9–10.3)
Chloride: 100 mmol/L (ref 98–111)
Creatinine: 0.95 mg/dL (ref 0.44–1.00)
GFR, Estimated: 57 mL/min — ABNORMAL LOW (ref >60)
Glucose, Bld: 118 mg/dL — ABNORMAL HIGH (ref 70–99)
Potassium: 4.3 mmol/L (ref 3.5–5.1)
Sodium: 136 mmol/L (ref 135–145)
Total Bilirubin: 0.3 mg/dL (ref 0.3–1.2)
Total Protein: 7.1 g/dL (ref 6.5–8.1)

## 2022-01-18 MED ORDER — PROCHLORPERAZINE MALEATE 10 MG PO TABS
10.0000 mg | ORAL_TABLET | Freq: Once | ORAL | Status: AC
  Administered 2022-01-18: 10 mg via ORAL
  Filled 2022-01-18: qty 1

## 2022-01-18 MED ORDER — SODIUM CHLORIDE 0.9 % IV SOLN
800.0000 mg/m2 | Freq: Once | INTRAVENOUS | Status: AC
  Administered 2022-01-18: 1254 mg via INTRAVENOUS
  Filled 2022-01-18: qty 26.3

## 2022-01-18 MED ORDER — SODIUM CHLORIDE 0.9% FLUSH
10.0000 mL | INTRAVENOUS | Status: DC | PRN
  Administered 2022-01-18: 10 mL

## 2022-01-18 MED ORDER — SODIUM CHLORIDE 0.9 % IV SOLN: Freq: Once | INTRAVENOUS | Status: AC

## 2022-01-18 MED ORDER — HEPARIN SOD (PORK) LOCK FLUSH 100 UNIT/ML IV SOLN
500.0000 [IU] | Freq: Once | INTRAVENOUS | Status: AC | PRN
  Administered 2022-01-18: 500 [IU]

## 2022-01-18 NOTE — Patient Instructions (Signed)
Gemcitabine Injection What is this medication? GEMCITABINE (jem SYE ta been) treats some types of cancer. It works by slowing down the growth of cancer cells. This medicine may be used for other purposes; ask your health care provider or pharmacist if you have questions. COMMON BRAND NAME(S): Gemzar, Infugem What should I tell my care team before I take this medication? They need to know if you have any of these conditions: Blood disorders Infection Kidney disease Liver disease Lung or breathing disease, such as asthma or COPD Recent or ongoing radiation therapy An unusual or allergic reaction to gemcitabine, other medications, foods, dyes, or preservatives If you or your partner are pregnant or trying to get pregnant Breast-feeding How should I use this medication? This medication is injected into a vein. It is given by your care team in a hospital or clinic setting. Talk to your care team about the use of this medication in children. Special care may be needed. Overdosage: If you think you have taken too much of this medicine contact a poison control center or emergency room at once. NOTE: This medicine is only for you. Do not share this medicine with others. What if I miss a dose? Keep appointments for follow-up doses. It is important not to miss your dose. Call your care team if you are unable to keep an appointment. What may interact with this medication? Interactions have not been studied. This list may not describe all possible interactions. Give your health care provider a list of all the medicines, herbs, non-prescription drugs, or dietary supplements you use. Also tell them if you smoke, drink alcohol, or use illegal drugs. Some items may interact with your medicine. What should I watch for while using this medication? Your condition will be monitored carefully while you are receiving this medication. This medication may make you feel generally unwell. This is not uncommon, as  chemotherapy can affect healthy cells as well as cancer cells. Report any side effects. Continue your course of treatment even though you feel ill unless your care team tells you to stop. In some cases, you may be given additional medications to help with side effects. Follow all directions for their use. This medication may increase your risk of getting an infection. Call your care team for advice if you get a fever, chills, sore throat, or other symptoms of a cold or flu. Do not treat yourself. Try to avoid being around people who are sick. This medication may increase your risk to bruise or bleed. Call your care team if you notice any unusual bleeding. Be careful brushing or flossing your teeth or using a toothpick because you may get an infection or bleed more easily. If you have any dental work done, tell your dentist you are receiving this medication. Avoid taking medications that contain aspirin, acetaminophen, ibuprofen, naproxen, or ketoprofen unless instructed by your care team. These medications may hide a fever. Talk to your care team if you or your partner wish to become pregnant or think you might be pregnant. This medication can cause serious birth defects if taken during pregnancy and for 6 months after the last dose. A negative pregnancy test is required before starting this medication. A reliable form of contraception is recommended while taking this medication and for 6 months after the last dose. Talk to your care team about effective forms of contraception. Do not father a child while taking this medication and for 3 months after the last dose. Use a condom while having sex   during this time period. Do not breastfeed while taking this medication and for at least 1 week after the last dose. This medication may cause infertility. Talk to your care team if you are concerned about your fertility. What side effects may I notice from receiving this medication? Side effects that you should  report to your care team as soon as possible: Allergic reactions--skin rash, itching, hives, swelling of the face, lips, tongue, or throat Capillary leak syndrome--stomach or muscle pain, unusual weakness or fatigue, feeling faint or lightheaded, decrease in the amount of urine, swelling of the ankles, hands, or feet, trouble breathing Infection--fever, chills, cough, sore throat, wounds that don't heal, pain or trouble when passing urine, general feeling of discomfort or being unwell Liver injury--right upper belly pain, loss of appetite, nausea, light-colored stool, dark yellow or brown urine, yellowing skin or eyes, unusual weakness or fatigue Low red blood cell level--unusual weakness or fatigue, dizziness, headache, trouble breathing Lung injury--shortness of breath or trouble breathing, cough, spitting up blood, chest pain, fever Stomach pain, bloody diarrhea, pale skin, unusual weakness or fatigue, decrease in the amount of urine, which may be signs of hemolytic uremic syndrome Sudden and severe headache, confusion, change in vision, seizures, which may be signs of posterior reversible encephalopathy syndrome (PRES) Unusual bruising or bleeding Side effects that usually do not require medical attention (report to your care team if they continue or are bothersome): Diarrhea Drowsiness Hair loss Nausea Pain, redness, or swelling with sores inside the mouth or throat Vomiting This list may not describe all possible side effects. Call your doctor for medical advice about side effects. You may report side effects to FDA at 1-800-FDA-1088. Where should I keep my medication? This medication is given in a hospital or clinic. It will not be stored at home. NOTE: This sheet is a summary. It may not cover all possible information. If you have questions about this medicine, talk to your doctor, pharmacist, or health care provider.  2023 Elsevier/Gold Standard (2021-08-01 00:00:00)  

## 2022-01-18 NOTE — Patient Instructions (Signed)

## 2022-01-18 NOTE — Progress Notes (Signed)
Cbc and cmet reviewed by MD, ok to treat depsite counts

## 2022-01-22 ENCOUNTER — Other Ambulatory Visit: Payer: Self-pay | Admitting: *Deleted

## 2022-01-22 DIAGNOSIS — E119 Type 2 diabetes mellitus without complications: Secondary | ICD-10-CM

## 2022-01-22 MED ORDER — ACCU-CHEK GUIDE VI STRP: ORAL_STRIP | 12 refills | Status: DC

## 2022-02-01 ENCOUNTER — Other Ambulatory Visit: Payer: Self-pay | Admitting: Hematology & Oncology

## 2022-02-01 DIAGNOSIS — C259 Malignant neoplasm of pancreas, unspecified: Secondary | ICD-10-CM

## 2022-02-07 ENCOUNTER — Inpatient Hospital Stay: Payer: Medicare Other

## 2022-02-07 ENCOUNTER — Encounter: Payer: Self-pay | Admitting: Oncology

## 2022-02-07 ENCOUNTER — Inpatient Hospital Stay (HOSPITAL_BASED_OUTPATIENT_CLINIC_OR_DEPARTMENT_OTHER): Payer: Medicare Other | Admitting: Oncology

## 2022-02-07 VITALS — BP 157/58 | HR 61

## 2022-02-07 DIAGNOSIS — Z7982 Long term (current) use of aspirin: Secondary | ICD-10-CM | POA: Diagnosis not present

## 2022-02-07 DIAGNOSIS — Z5111 Encounter for antineoplastic chemotherapy: Secondary | ICD-10-CM | POA: Diagnosis not present

## 2022-02-07 DIAGNOSIS — C259 Malignant neoplasm of pancreas, unspecified: Secondary | ICD-10-CM

## 2022-02-07 DIAGNOSIS — Z95828 Presence of other vascular implants and grafts: Secondary | ICD-10-CM

## 2022-02-07 DIAGNOSIS — Z5112 Encounter for antineoplastic immunotherapy: Secondary | ICD-10-CM | POA: Diagnosis not present

## 2022-02-07 DIAGNOSIS — C787 Secondary malignant neoplasm of liver and intrahepatic bile duct: Secondary | ICD-10-CM

## 2022-02-07 DIAGNOSIS — R160 Hepatomegaly, not elsewhere classified: Secondary | ICD-10-CM

## 2022-02-07 DIAGNOSIS — Z7984 Long term (current) use of oral hypoglycemic drugs: Secondary | ICD-10-CM | POA: Diagnosis not present

## 2022-02-07 DIAGNOSIS — Z79899 Other long term (current) drug therapy: Secondary | ICD-10-CM | POA: Diagnosis not present

## 2022-02-07 DIAGNOSIS — C801 Malignant (primary) neoplasm, unspecified: Secondary | ICD-10-CM | POA: Diagnosis not present

## 2022-02-07 DIAGNOSIS — Z7189 Other specified counseling: Secondary | ICD-10-CM

## 2022-02-07 DIAGNOSIS — Z7951 Long term (current) use of inhaled steroids: Secondary | ICD-10-CM | POA: Diagnosis not present

## 2022-02-07 LAB — CBC WITH DIFFERENTIAL (CANCER CENTER ONLY)
Abs Immature Granulocytes: 0.03 10*3/uL (ref 0.00–0.07)
Basophils Absolute: 0 10*3/uL (ref 0.0–0.1)
Basophils Relative: 1 %
Eosinophils Absolute: 0.1 10*3/uL (ref 0.0–0.5)
Eosinophils Relative: 3 %
HCT: 26.9 % — ABNORMAL LOW (ref 36.0–46.0)
Hemoglobin: 8.4 g/dL — ABNORMAL LOW (ref 12.0–15.0)
Immature Granulocytes: 1 %
Lymphocytes Relative: 30 %
Lymphs Abs: 1 10*3/uL (ref 0.7–4.0)
MCH: 31 pg (ref 26.0–34.0)
MCHC: 31.2 g/dL (ref 30.0–36.0)
MCV: 99.3 fL (ref 80.0–100.0)
Monocytes Absolute: 0.6 10*3/uL (ref 0.1–1.0)
Monocytes Relative: 18 %
Neutro Abs: 1.7 10*3/uL (ref 1.7–7.7)
Neutrophils Relative %: 47 %
Platelet Count: 346 10*3/uL (ref 150–400)
RBC: 2.71 MIL/uL — ABNORMAL LOW (ref 3.87–5.11)
RDW: 15 % (ref 11.5–15.5)
WBC Count: 3.4 10*3/uL — ABNORMAL LOW (ref 4.0–10.5)
nRBC: 0 % (ref 0.0–0.2)

## 2022-02-07 LAB — CMP (CANCER CENTER ONLY)
ALT: 12 U/L (ref 0–44)
AST: 19 U/L (ref 15–41)
Albumin: 3.8 g/dL (ref 3.5–5.0)
Alkaline Phosphatase: 47 U/L (ref 38–126)
Anion gap: 8 (ref 5–15)
BUN: 21 mg/dL (ref 8–23)
CO2: 28 mmol/L (ref 22–32)
Calcium: 9.3 mg/dL (ref 8.9–10.3)
Chloride: 101 mmol/L (ref 98–111)
Creatinine: 0.9 mg/dL (ref 0.44–1.00)
GFR, Estimated: >60 mL/min (ref >60)
Glucose, Bld: 113 mg/dL — ABNORMAL HIGH (ref 70–99)
Potassium: 4.5 mmol/L (ref 3.5–5.1)
Sodium: 137 mmol/L (ref 135–145)
Total Bilirubin: 0.3 mg/dL (ref 0.3–1.2)
Total Protein: 6.3 g/dL — ABNORMAL LOW (ref 6.5–8.1)

## 2022-02-07 LAB — TSH: TSH: 1.529 u[IU]/mL (ref 0.350–4.500)

## 2022-02-07 MED ORDER — SODIUM CHLORIDE 0.9% FLUSH
10.0000 mL | INTRAVENOUS | Status: DC | PRN
  Administered 2022-02-07: 10 mL

## 2022-02-07 MED ORDER — COLD PACK MISC ONCOLOGY: 1.0000 | Freq: Once | Status: DC | PRN

## 2022-02-07 MED ORDER — SODIUM CHLORIDE 0.9 % IV SOLN
1500.0000 mg | Freq: Once | INTRAVENOUS | Status: AC
  Administered 2022-02-07: 1500 mg via INTRAVENOUS
  Filled 2022-02-07: qty 30

## 2022-02-07 MED ORDER — SODIUM CHLORIDE 0.9% FLUSH
10.0000 mL | Freq: Once | INTRAVENOUS | Status: AC
  Administered 2022-02-07: 10 mL

## 2022-02-07 MED ORDER — HEPARIN SOD (PORK) LOCK FLUSH 100 UNIT/ML IV SOLN
500.0000 [IU] | Freq: Once | INTRAVENOUS | Status: AC | PRN
  Administered 2022-02-07: 500 [IU]

## 2022-02-07 MED ORDER — SODIUM CHLORIDE 0.9 % IV SOLN: Freq: Once | INTRAVENOUS | Status: DC

## 2022-02-07 MED ORDER — SODIUM CHLORIDE 0.9 % IV SOLN
800.0000 mg/m2 | Freq: Once | INTRAVENOUS | Status: AC
  Administered 2022-02-07: 1254 mg via INTRAVENOUS
  Filled 2022-02-07: qty 26.3

## 2022-02-07 MED ORDER — SODIUM CHLORIDE 0.9 % IV SOLN: Freq: Once | INTRAVENOUS | Status: AC

## 2022-02-07 MED ORDER — PROCHLORPERAZINE MALEATE 10 MG PO TABS
10.0000 mg | ORAL_TABLET | Freq: Once | ORAL | Status: AC
  Administered 2022-02-07: 10 mg via ORAL
  Filled 2022-02-07: qty 1

## 2022-02-07 NOTE — Patient Instructions (Signed)

## 2022-02-07 NOTE — Patient Instructions (Signed)
[facility]  Discharge Instructions: Thank you for choosing [facility] to provide your oncology and hematology care.   If you have a lab appointment with the [facility], please go directly to the [facility] and check in at the registration area.  Wear comfortable clothing and clothing appropriate for easy access to any Portacath or PICC line.   We strive to give you quality time with your provider. You may need to reschedule your appointment if you arrive late (15 or more minutes).  Arriving late affects you and other patients whose appointments are after yours.  Also, if you miss three or more appointments without notifying the office, you may be dismissed from the clinic at the provider's discretion.      For prescription refill requests, have your pharmacy contact our office and allow 72 hours for refills to be completed.    Today you received the following chemotherapy and/or immunotherapy agents gemzar, imfinzi     To help prevent nausea and vomiting after your treatment, we encourage you to take your nausea medication as directed.  BELOW ARE SYMPTOMS THAT SHOULD BE REPORTED IMMEDIATELY: *FEVER GREATER THAN 100.4 F (38 C) OR HIGHER *CHILLS OR SWEATING *NAUSEA AND VOMITING THAT IS NOT CONTROLLED WITH YOUR NAUSEA MEDICATION *UNUSUAL SHORTNESS OF BREATH *UNUSUAL BRUISING OR BLEEDING *URINARY PROBLEMS (pain or burning when urinating, or frequent urination) *BOWEL PROBLEMS (unusual diarrhea, constipation, pain near the anus) TENDERNESS IN MOUTH AND THROAT WITH OR WITHOUT PRESENCE OF ULCERS (sore throat, sores in mouth, or a toothache) UNUSUAL RASH, SWELLING OR PAIN  UNUSUAL VAGINAL DISCHARGE OR ITCHING   Items with * indicate a potential emergency and should be followed up as soon as possible or go to the Emergency Department if any problems should occur.  Please show the CHEMOTHERAPY ALERT CARD or IMMUNOTHERAPY ALERT CARD at check-in to  the Emergency Department and triage nurse. Should you have questions after your visit or need to cancel or reschedule your appointment, please contact [facility]  [phone] and follow the prompts.  Office hours are 8:00 a.m. to 4:30 p.m. Monday - Friday. Please note that voicemails left after 4:00 p.m. may not be returned until the following business day.  We are closed weekends and major holidays. You have access to a nurse at all times for urgent questions. Please call the main number to the clinic [phone] and follow the prompts.  For any non-urgent questions, you may also contact your provider using MyChart. We now offer e-Visits for anyone 84 and older to request care online for non-urgent symptoms. For details visit [email or web address].   Also download the MyChart app! Go to the app store, search "MyChart", open the app, select [facility], and log in with your MyChart username and password.  Masks are optional in the cancer centers. If you would like for your care team to wear a mask while they are taking care of you, please let them know. You may have one support person who is at least 86 years old accompany you for your appointments.

## 2022-02-07 NOTE — Progress Notes (Signed)
Hematology and Oncology Follow Up Visit  Angelica Benson [id number] 02/27/1929 88 y.o. 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  She is doing pretty well.  She really has had no specific complaints since we saw her last.  I must say that despite her maturity, she really has done a very nice job with treatment.  She is eating okay.  She is having no nausea or vomiting.  She does have no change in bowel or bladder habits.  She has had no rashes.  There is been no leg swelling.  She has had no fever.  There is no cough or shortness of breath.  Overall, I would say her performance status is ECOG 2.    Medications:  Current Outpatient Medications:    Accu-Chek FastClix Lancets MISC, USE AS DIRECTED TO CHECK  BLOOD SUGAR, Disp: 102 each, Rfl: 2   acetaminophen (TYLENOL) 500 MG tablet, Take 2 tablets (1,000 mg total) by mouth 2 (two) times daily as needed for moderate pain., Disp: 30 tablet, Rfl: 0   aspirin EC 81 MG tablet, Take 81 mg by mouth daily., Disp: , Rfl:    atorvastatin (LIPITOR) 10 MG tablet, TAKE 1 TABLET BY MOUTH  DAILY, Disp: 100 tablet, Rfl: 2   betamethasone dipropionate 0.05 % cream, APPLY TOPICALLY TWICE DAILY, Disp: 30 g, Rfl: 0   escitalopram (LEXAPRO) 5 MG tablet, TAKE 1 TABLET BY MOUTH DAILY, Disp: 100 tablet, Rfl: 1   glucose blood (ACCU-CHEK GUIDE) test strip, Check blood sugars 1-2 times daily, Disp: 200 strip, Rfl: 12   lisinopril (ZESTRIL) 10 MG tablet, TAKE 1 TABLET BY MOUTH DAILY, Disp: 100 tablet, Rfl: 2   metFORMIN (GLUCOPHAGE) 500 MG tablet, TAKE 1 TABLET BY MOUTH TWICE  DAILY WITH A MEAL, Disp: 180 tablet, Rfl: 3   mometasone-formoterol (DULERA) 200-5 MCG/ACT AERO, Inhale 1 puff into the lungs 2 (two) times daily., Disp: 39 g, Rfl:  1   Multiple Vitamins-Minerals (CENTRUM SILVER 50+WOMEN) TABS, Take 1 tablet by mouth daily., Disp: , Rfl:    omeprazole (PRILOSEC) 20 MG capsule, TAKE 1 CAPSULE BY MOUTH DAILY, Disp: 30 capsule, Rfl: 11   albuterol (VENTOLIN HFA) 108 (90 Base) MCG/ACT inhaler, USE 2 INHALATIONS BY MOUTH EVERY 6 HOURS AS NEEDED FOR WHEEZING  OR SHORTNESS OF BREATH (Patient not taking: Reported on 02/07/2022), Disp: 8.5 g, Rfl: 0   fluticasone (FLONASE) 50 MCG/ACT nasal spray, USE 2 SPRAYS IN BOTH  NOSTRILS DAILY (Patient not taking: Reported on 02/07/2022), Disp: 48 g, Rfl: 1   nitroGLYCERIN (NITROSTAT) 0.4 MG SL tablet, DISSOLVE 1 TABLET UNDER THE TONGUE EVERY 5 MINUTES AS  NEEDED FOR CHEST PAIN. MAX  OF 3 TABLETS IN 15 MINUTES. CALL 911 IF PAIN PERSISTS. (Patient not taking: Reported on 09/12/2021), Disp: 25 tablet, Rfl: 1 No current facility-administered medications for this visit.  Facility-Administered Medications Ordered in Other Visits:    prochlorperazine (COMPAZINE) 10 MG tablet, , , ,    sodium chloride flush (NS) 0.9 % injection 10 mL, 10 mL, Intravenous, PRN, Carter, Sarah M, NP, 10 mL at 11/29/21 1244  Allergies:  Allergies  Allergen Reactions   Buprenorphine Hcl Rash   Morphine And Related Rash    Past Medical History, Surgical history, Social history, and Family History were reviewed and updated.  Review   of Systems: Review of Systems  Constitutional:  Positive for fatigue. Negative for unexpected weight change.  HENT:  Negative.    Eyes: Negative.   Respiratory: Negative.    Cardiovascular: Negative.   Gastrointestinal:  Negative for abdominal pain.  Endocrine: Negative.   Musculoskeletal: Negative.   Skin: Negative.   Neurological: Negative.   Hematological: Negative.   Psychiatric/Behavioral: Negative.      Physical Exam:  height is 5' 1" (1.549 m) and weight is 60.8 kg. Her temperature is 98.3 F (36.8 C). Her blood pressure is 153/53 (abnormal) and her pulse is 69. Her  respiration is 20 and oxygen saturation is 100%.   Wt Readings from Last 3 Encounters:  02/07/22 60.8 kg  01/11/22 59.1 kg  12/14/21 58.1 kg    Physical Exam Vitals reviewed.  HENT:     Head: Normocephalic and atraumatic.  Eyes:     Pupils: Pupils are equal, round, and reactive to light.  Cardiovascular:     Rate and Rhythm: Normal rate and regular rhythm.     Heart sounds: Normal heart sounds.  Pulmonary:     Effort: Pulmonary effort is normal.     Breath sounds: Normal breath sounds.  Abdominal:     General: Bowel sounds are normal.     Palpations: Abdomen is soft.  Musculoskeletal:        General: No tenderness or deformity. Normal range of motion.     Cervical back: Normal range of motion.  Lymphadenopathy:     Cervical: No cervical adenopathy.  Skin:    General: Skin is warm and dry.     Findings: No erythema or rash.  Neurological:     Mental Status: She is alert and oriented to person, place, and time.  Psychiatric:        Behavior: Behavior normal.        Thought Content: Thought content normal.        Judgment: Judgment normal.      Lab Results  Component Value Date   WBC 3.4 (L) 02/07/2022   HGB 8.4 (L) 02/07/2022   HCT 26.9 (L) 02/07/2022   MCV 99.3 02/07/2022   PLT 346 02/07/2022     Chemistry      Component Value Date/Time   NA 136 01/18/2022 1145   K 4.3 01/18/2022 1145   CL 100 01/18/2022 1145   CO2 29 01/18/2022 1145   BUN 34 (H) 01/18/2022 1145   CREATININE 0.95 01/18/2022 1145   CREATININE 0.92 06/29/2021 1450      Component Value Date/Time   CALCIUM 9.9 01/18/2022 1145   ALKPHOS 53 01/18/2022 1145   AST 20 01/18/2022 1145   ALT 13 01/18/2022 1145   BILITOT 0.3 01/18/2022 1145      Impression and Plan: Angelica Benson is a very charming 88 year old African-American female.  She has a poorly differentiated adenocarcinoma.  Again I suspect that this is can be a pancreaticobiliary primary.  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  As  such, we can certainly consider this as second line therapy.  Recent MRI showed everything was stable.  She has tolerated her treatment incredibly well.  Will continue her on her current treatment with the gemcitabine/Durvalumab.  Repeat MRI after next cycle of chemo.  We will plan to get her back to see her in another month.   Mikey Bussing, NP 10/26/20231:55 PM

## 2022-02-08 ENCOUNTER — Telehealth: Payer: Self-pay | Admitting: *Deleted

## 2022-02-08 ENCOUNTER — Encounter: Payer: Self-pay | Admitting: *Deleted

## 2022-02-08 ENCOUNTER — Other Ambulatory Visit: Payer: Self-pay | Admitting: *Deleted

## 2022-02-08 DIAGNOSIS — Z95828 Presence of other vascular implants and grafts: Secondary | ICD-10-CM

## 2022-02-08 DIAGNOSIS — R160 Hepatomegaly, not elsewhere classified: Secondary | ICD-10-CM

## 2022-02-08 DIAGNOSIS — C787 Secondary malignant neoplasm of liver and intrahepatic bile duct: Secondary | ICD-10-CM

## 2022-02-08 DIAGNOSIS — Z7189 Other specified counseling: Secondary | ICD-10-CM

## 2022-02-08 LAB — ABO/RH: ABO/RH(D): A POS

## 2022-02-08 NOTE — Telephone Encounter (Signed)
-----   Message from Volanda Napoleon, MD sent at 02/08/2022  5:58 AM EDT ----- She needs a transfusion. Please set up 2 units for her!!  Laurey Arrow

## 2022-02-08 NOTE — Telephone Encounter (Signed)
Call placed to patient's daughter Vita Barley to inform her that Dr. Marin Olp would like for pt to get 2 units of blood.  Vita Barley states that pt can not come in today, but can come in on Monday for type and crossmatch and Tuesday for blood transfusion. Message sent to scheduling.

## 2022-02-08 NOTE — Progress Notes (Unsigned)
Patient was well enough for treatment. She will need to come back next week for blood transfusion.   Oncology Nurse Navigator Documentation     02/08/2022   10:00 AM  Oncology Nurse Navigator Flowsheets  Navigator Follow Up Date: 03/12/2022  Navigator Follow Up Reason: Follow-up Appointment;Chemotherapy  Navigator Location [facility]  Navigator Encounter Type Appt/Treatment Plan Review  Patient Visit Type MedOnc  Treatment Phase Active Tx  Barriers/Navigation Needs No Barriers At This Time  Interventions None Required  Acuity Level 1-No Barriers  Support Groups/Services Friends and Family  Time Spent with Patient 15

## 2022-02-08 NOTE — Telephone Encounter (Signed)
Called daughter to see if she would call Dr. Burnell Blanks office to move appointment on 10/31 so that we could get patient scheduled for (2) units of blood @ 10:00 a.m. [name] verbally agreed and said that she would call back and let us know when the appointment has been rescheduled with Dr. Prudence Davidson.

## 2022-02-09 ENCOUNTER — Other Ambulatory Visit: Payer: Self-pay

## 2022-02-09 LAB — T4: T4, Total: 7.3 ug/dL (ref 4.5–12.0)

## 2022-02-11 ENCOUNTER — Inpatient Hospital Stay: Payer: Medicare Other

## 2022-02-11 ENCOUNTER — Other Ambulatory Visit: Payer: Self-pay | Admitting: *Deleted

## 2022-02-11 ENCOUNTER — Other Ambulatory Visit: Payer: Self-pay | Admitting: Family

## 2022-02-11 DIAGNOSIS — Z5112 Encounter for antineoplastic immunotherapy: Secondary | ICD-10-CM | POA: Diagnosis not present

## 2022-02-11 DIAGNOSIS — D649 Anemia, unspecified: Secondary | ICD-10-CM

## 2022-02-11 DIAGNOSIS — C801 Malignant (primary) neoplasm, unspecified: Secondary | ICD-10-CM | POA: Diagnosis not present

## 2022-02-11 DIAGNOSIS — C787 Secondary malignant neoplasm of liver and intrahepatic bile duct: Secondary | ICD-10-CM

## 2022-02-11 DIAGNOSIS — Z95828 Presence of other vascular implants and grafts: Secondary | ICD-10-CM

## 2022-02-11 DIAGNOSIS — Z7984 Long term (current) use of oral hypoglycemic drugs: Secondary | ICD-10-CM | POA: Diagnosis not present

## 2022-02-11 DIAGNOSIS — Z79899 Other long term (current) drug therapy: Secondary | ICD-10-CM | POA: Diagnosis not present

## 2022-02-11 DIAGNOSIS — Z5111 Encounter for antineoplastic chemotherapy: Secondary | ICD-10-CM | POA: Diagnosis not present

## 2022-02-11 DIAGNOSIS — Z7951 Long term (current) use of inhaled steroids: Secondary | ICD-10-CM | POA: Diagnosis not present

## 2022-02-11 DIAGNOSIS — Z7982 Long term (current) use of aspirin: Secondary | ICD-10-CM | POA: Diagnosis not present

## 2022-02-11 LAB — CMP (CANCER CENTER ONLY)
ALT: 12 U/L (ref 0–44)
AST: 20 U/L (ref 15–41)
Albumin: 3.7 g/dL (ref 3.5–5.0)
Alkaline Phosphatase: 47 U/L (ref 38–126)
Anion gap: 8 (ref 5–15)
BUN: 24 mg/dL — ABNORMAL HIGH (ref 8–23)
CO2: 27 mmol/L (ref 22–32)
Calcium: 9.3 mg/dL (ref 8.9–10.3)
Chloride: 103 mmol/L (ref 98–111)
Creatinine: 0.88 mg/dL (ref 0.44–1.00)
GFR, Estimated: >60 mL/min (ref >60)
Glucose, Bld: 112 mg/dL — ABNORMAL HIGH (ref 70–99)
Potassium: 4.4 mmol/L (ref 3.5–5.1)
Sodium: 138 mmol/L (ref 135–145)
Total Bilirubin: 0.4 mg/dL (ref 0.3–1.2)
Total Protein: 6.2 g/dL — ABNORMAL LOW (ref 6.5–8.1)

## 2022-02-11 LAB — CBC WITH DIFFERENTIAL (CANCER CENTER ONLY)
Abs Immature Granulocytes: 0.01 10*3/uL (ref 0.00–0.07)
Basophils Absolute: 0 10*3/uL (ref 0.0–0.1)
Basophils Relative: 1 %
Eosinophils Absolute: 0.1 10*3/uL (ref 0.0–0.5)
Eosinophils Relative: 3 %
HCT: 25.8 % — ABNORMAL LOW (ref 36.0–46.0)
Hemoglobin: 8.1 g/dL — ABNORMAL LOW (ref 12.0–15.0)
Immature Granulocytes: 0 %
Lymphocytes Relative: 27 %
Lymphs Abs: 0.8 10*3/uL (ref 0.7–4.0)
MCH: 31.3 pg (ref 26.0–34.0)
MCHC: 31.4 g/dL (ref 30.0–36.0)
MCV: 99.6 fL (ref 80.0–100.0)
Monocytes Absolute: 0.2 10*3/uL (ref 0.1–1.0)
Monocytes Relative: 6 %
Neutro Abs: 1.8 10*3/uL (ref 1.7–7.7)
Neutrophils Relative %: 63 %
Platelet Count: 279 10*3/uL (ref 150–400)
RBC: 2.59 MIL/uL — ABNORMAL LOW (ref 3.87–5.11)
RDW: 14.8 % (ref 11.5–15.5)
WBC Count: 2.9 10*3/uL — ABNORMAL LOW (ref 4.0–10.5)
nRBC: 0 % (ref 0.0–0.2)

## 2022-02-11 LAB — PREPARE RBC (CROSSMATCH)

## 2022-02-11 MED ORDER — HEPARIN SOD (PORK) LOCK FLUSH 100 UNIT/ML IV SOLN
500.0000 [IU] | Freq: Once | INTRAVENOUS | Status: AC
  Administered 2022-02-11: 500 [IU] via INTRAVENOUS

## 2022-02-11 MED ORDER — SODIUM CHLORIDE 0.9% FLUSH
10.0000 mL | Freq: Once | INTRAVENOUS | Status: AC
  Administered 2022-02-11: 10 mL via INTRAVENOUS

## 2022-02-11 NOTE — Patient Instructions (Signed)

## 2022-02-12 ENCOUNTER — Inpatient Hospital Stay: Payer: Medicare Other

## 2022-02-12 ENCOUNTER — Ambulatory Visit: Payer: Medicare Other | Admitting: Podiatry

## 2022-02-12 DIAGNOSIS — D649 Anemia, unspecified: Secondary | ICD-10-CM

## 2022-02-12 DIAGNOSIS — C801 Malignant (primary) neoplasm, unspecified: Secondary | ICD-10-CM | POA: Diagnosis not present

## 2022-02-12 DIAGNOSIS — C787 Secondary malignant neoplasm of liver and intrahepatic bile duct: Secondary | ICD-10-CM | POA: Diagnosis not present

## 2022-02-12 DIAGNOSIS — Z5111 Encounter for antineoplastic chemotherapy: Secondary | ICD-10-CM | POA: Diagnosis not present

## 2022-02-12 DIAGNOSIS — Z7951 Long term (current) use of inhaled steroids: Secondary | ICD-10-CM | POA: Diagnosis not present

## 2022-02-12 DIAGNOSIS — Z79899 Other long term (current) drug therapy: Secondary | ICD-10-CM | POA: Diagnosis not present

## 2022-02-12 DIAGNOSIS — Z7984 Long term (current) use of oral hypoglycemic drugs: Secondary | ICD-10-CM | POA: Diagnosis not present

## 2022-02-12 DIAGNOSIS — Z7982 Long term (current) use of aspirin: Secondary | ICD-10-CM | POA: Diagnosis not present

## 2022-02-12 DIAGNOSIS — Z5112 Encounter for antineoplastic immunotherapy: Secondary | ICD-10-CM | POA: Diagnosis not present

## 2022-02-12 DIAGNOSIS — C259 Malignant neoplasm of pancreas, unspecified: Secondary | ICD-10-CM

## 2022-02-12 MED ORDER — HEPARIN SOD (PORK) LOCK FLUSH 100 UNIT/ML IV SOLN
500.0000 [IU] | Freq: Every day | INTRAVENOUS | Status: AC | PRN
  Administered 2022-02-12: 500 [IU]

## 2022-02-12 MED ORDER — SODIUM CHLORIDE 0.9% FLUSH
10.0000 mL | INTRAVENOUS | Status: AC | PRN
  Administered 2022-02-12: 10 mL

## 2022-02-12 MED ORDER — ACETAMINOPHEN 325 MG PO TABS: 650.0000 mg | ORAL_TABLET | Freq: Once | ORAL | Status: DC

## 2022-02-12 MED ORDER — FUROSEMIDE 10 MG/ML IJ SOLN
20.0000 mg | Freq: Once | INTRAMUSCULAR | Status: AC
  Administered 2022-02-12: 20 mg via INTRAVENOUS
  Filled 2022-02-12: qty 4

## 2022-02-12 MED ORDER — SODIUM CHLORIDE 0.9% IV SOLUTION
250.0000 mL | Freq: Once | INTRAVENOUS | Status: AC
  Administered 2022-02-12: 250 mL via INTRAVENOUS

## 2022-02-12 MED ORDER — DIPHENHYDRAMINE HCL 25 MG PO CAPS: 25.0000 mg | ORAL_CAPSULE | Freq: Once | ORAL | Status: DC

## 2022-02-12 NOTE — Patient Instructions (Signed)
Blood Transfusion, Adult, Care After The following information offers guidance on how to care for yourself after your procedure. Your health care provider may also give you more specific instructions. If you have problems or questions, contact your health care provider. What can I expect after the procedure? After the procedure, it is common to have: Bruising and soreness where the IV was inserted. A headache. Follow these instructions at home: IV insertion site care     Follow instructions from your health care provider about how to take care of your IV insertion site. Make sure you: Wash your hands with soap and water for at least 20 seconds before and after you change your bandage (dressing). If soap and water are not available, use hand sanitizer. Change your dressing as told by your health care provider. Check your IV insertion site every day for signs of infection. Check for: Redness, swelling, or pain. Bleeding from the site. Warmth. Pus or a bad smell. General instructions Take over-the-counter and prescription medicines only as told by your health care provider. Rest as told by your health care provider. Return to your normal activities as told by your health care provider. Keep all follow-up visits. Lab tests may need to be done at certain periods to recheck your blood counts. Contact a health care provider if: You have itching or red, swollen areas of skin (hives). You have a fever or chills. You have pain in the head, back, or chest. You feel anxious or you feel weak after doing your normal activities. You have redness, swelling, warmth, or pain around the IV insertion site. You have blood coming from the IV insertion site that does not stop with pressure. You have pus or a bad smell coming from your IV insertion site. If you received your blood transfusion in an outpatient setting, you will be told whom to contact to report any reactions. Get help right away if: You  have symptoms of a serious allergic or immune system reaction, including: Trouble breathing or shortness of breath. Swelling of the face, feeling flushed, or widespread rash. Dark urine or blood in the urine. Fast heartbeat. These symptoms may be an emergency. Get help right away. Call 911. Do not wait to see if the symptoms will go away. Do not drive yourself to the hospital. Summary Bruising and soreness around the IV insertion site are common. Check your IV insertion site every day for signs of infection. Rest as told by your health care provider. Return to your normal activities as told by your health care provider. Get help right away for symptoms of a serious allergic or immune system reaction to the blood transfusion. This information is not intended to replace advice given to you by your health care provider. Make sure you discuss any questions you have with your health care provider. Document Revised: 06/29/2021 Document Reviewed: 06/29/2021 Elsevier Patient Education  2023 Elsevier Inc.  

## 2022-02-13 ENCOUNTER — Other Ambulatory Visit: Payer: Self-pay

## 2022-02-13 ENCOUNTER — Encounter: Payer: Self-pay | Admitting: Hematology & Oncology

## 2022-02-13 ENCOUNTER — Other Ambulatory Visit: Payer: Self-pay | Admitting: Family

## 2022-02-13 LAB — TYPE AND SCREEN
ABO/RH(D): A POS
Antibody Screen: NEGATIVE
Unit division: 0
Unit division: 0

## 2022-02-13 LAB — BPAM RBC
Blood Product Expiration Date: 202311072359
Blood Product Expiration Date: 202311172359
ISSUE DATE / TIME: 202310310756
ISSUE DATE / TIME: 202310310756
Unit Type and Rh: 6200
Unit Type and Rh: 6200

## 2022-02-13 MED ORDER — ALBUTEROL SULFATE HFA 108 (90 BASE) MCG/ACT IN AERS: 2.0000 | INHALATION_SPRAY | Freq: Four times a day (QID) | RESPIRATORY_TRACT | 1 refills | Status: DC | PRN

## 2022-02-13 MED ORDER — BETAMETHASONE DIPROPIONATE 0.05 % EX CREA: TOPICAL_CREAM | Freq: Two times a day (BID) | CUTANEOUS | 0 refills | Status: DC

## 2022-02-19 ENCOUNTER — Ambulatory Visit (INDEPENDENT_AMBULATORY_CARE_PROVIDER_SITE_OTHER): Payer: Medicare Other | Admitting: Podiatry

## 2022-02-19 ENCOUNTER — Encounter: Payer: Self-pay | Admitting: Podiatry

## 2022-02-19 ENCOUNTER — Other Ambulatory Visit: Payer: Self-pay

## 2022-02-19 DIAGNOSIS — M79674 Pain in right toe(s): Secondary | ICD-10-CM | POA: Diagnosis not present

## 2022-02-19 DIAGNOSIS — M79675 Pain in left toe(s): Secondary | ICD-10-CM

## 2022-02-19 DIAGNOSIS — B351 Tinea unguium: Secondary | ICD-10-CM | POA: Diagnosis not present

## 2022-02-19 DIAGNOSIS — E119 Type 2 diabetes mellitus without complications: Secondary | ICD-10-CM | POA: Diagnosis not present

## 2022-02-19 NOTE — Progress Notes (Signed)
This patient returns to my office for at risk foot care.  This patient requires this care by a professional since this patient will be at risk due to having  Type 2 diabetes.  This patient is unable to cut nails herself since the patient cannot reach her nails.These nails are painful walking and wearing shoes.   Patient presents to the office with her daughter. This patient presents for at risk foot care today.  General Appearance  Alert, conversant and in no acute stress.  Vascular  Dorsalis pedis  are weakly  palpable  bilaterally.  Posterior tibial pulses are not palpable  B/L. Absent digital hair. Capillary return is within normal limits  bilaterally. Temperature is within normal limits  bilaterally.  Neurologic  Senn-Weinstein monofilament wire test diminished  bilaterally. Muscle power within normal limits bilaterally.  Nails Thick disfigured discolored nails with subungual debris  from hallux to fifth toes bilaterally. No evidence of bacterial infection or drainage bilaterally.  Orthopedic  No limitations of motion  feet .  No crepitus or effusions noted.  No bony pathology or digital deformities noted.  Skin  normotropic skin with no porokeratosis noted bilaterally.  No signs of infections or ulcers noted.     Onychomycosis  Pain in right toes  Pain in left toes  Consent was obtained for treatment procedures.   Mechanical debridement of nails 1-5  bilaterally performed with a nail nipper.  Filed with dremel without incident.    Return office visit  10 weeks                    Told patient to return for periodic foot care and evaluation due to potential at risk complications.   Gardiner Barefoot DPM

## 2022-02-21 ENCOUNTER — Other Ambulatory Visit: Payer: Self-pay

## 2022-03-01 NOTE — Progress Notes (Signed)
Cycle 3 day 8 not given, will delete per Dr. Antonieta Pert instructions.

## 2022-03-12 ENCOUNTER — Inpatient Hospital Stay: Payer: Medicare Other | Attending: Hematology & Oncology

## 2022-03-12 ENCOUNTER — Other Ambulatory Visit: Payer: Self-pay | Admitting: Family

## 2022-03-12 ENCOUNTER — Inpatient Hospital Stay: Payer: Medicare Other

## 2022-03-12 ENCOUNTER — Inpatient Hospital Stay (HOSPITAL_BASED_OUTPATIENT_CLINIC_OR_DEPARTMENT_OTHER): Payer: Medicare Other | Admitting: Family

## 2022-03-12 ENCOUNTER — Encounter: Payer: Self-pay | Admitting: Family

## 2022-03-12 VITALS — BP 163/56 | HR 62

## 2022-03-12 DIAGNOSIS — C259 Malignant neoplasm of pancreas, unspecified: Secondary | ICD-10-CM | POA: Diagnosis not present

## 2022-03-12 DIAGNOSIS — C787 Secondary malignant neoplasm of liver and intrahepatic bile duct: Secondary | ICD-10-CM | POA: Insufficient documentation

## 2022-03-12 DIAGNOSIS — Z5112 Encounter for antineoplastic immunotherapy: Secondary | ICD-10-CM | POA: Diagnosis not present

## 2022-03-12 DIAGNOSIS — Z5111 Encounter for antineoplastic chemotherapy: Secondary | ICD-10-CM | POA: Diagnosis not present

## 2022-03-12 LAB — CMP (CANCER CENTER ONLY)
ALT: 14 U/L (ref 0–44)
AST: 23 U/L (ref 15–41)
Albumin: 4.2 g/dL (ref 3.5–5.0)
Alkaline Phosphatase: 56 U/L (ref 38–126)
Anion gap: 7 (ref 5–15)
BUN: 28 mg/dL — ABNORMAL HIGH (ref 8–23)
CO2: 30 mmol/L (ref 22–32)
Calcium: 10.1 mg/dL (ref 8.9–10.3)
Chloride: 101 mmol/L (ref 98–111)
Creatinine: 0.96 mg/dL (ref 0.44–1.00)
GFR, Estimated: 56 mL/min — ABNORMAL LOW (ref >60)
Glucose, Bld: 106 mg/dL — ABNORMAL HIGH (ref 70–99)
Potassium: 4.4 mmol/L (ref 3.5–5.1)
Sodium: 138 mmol/L (ref 135–145)
Total Bilirubin: 0.5 mg/dL (ref 0.3–1.2)
Total Protein: 6.8 g/dL (ref 6.5–8.1)

## 2022-03-12 LAB — CBC WITH DIFFERENTIAL (CANCER CENTER ONLY)
Abs Immature Granulocytes: 0.01 10*3/uL (ref 0.00–0.07)
Basophils Absolute: 0.1 10*3/uL (ref 0.0–0.1)
Basophils Relative: 1 %
Eosinophils Absolute: 0.1 10*3/uL (ref 0.0–0.5)
Eosinophils Relative: 2 %
HCT: 38.2 % (ref 36.0–46.0)
Hemoglobin: 12.2 g/dL (ref 12.0–15.0)
Immature Granulocytes: 0 %
Lymphocytes Relative: 26 %
Lymphs Abs: 1.4 10*3/uL (ref 0.7–4.0)
MCH: 30.1 pg (ref 26.0–34.0)
MCHC: 31.9 g/dL (ref 30.0–36.0)
MCV: 94.3 fL (ref 80.0–100.0)
Monocytes Absolute: 0.5 10*3/uL (ref 0.1–1.0)
Monocytes Relative: 10 %
Neutro Abs: 3.3 10*3/uL (ref 1.7–7.7)
Neutrophils Relative %: 61 %
Platelet Count: 211 10*3/uL (ref 150–400)
RBC: 4.05 MIL/uL (ref 3.87–5.11)
RDW: 14.6 % (ref 11.5–15.5)
WBC Count: 5.4 10*3/uL (ref 4.0–10.5)
nRBC: 0 % (ref 0.0–0.2)

## 2022-03-12 LAB — LACTATE DEHYDROGENASE: LDH: 156 U/L (ref 98–192)

## 2022-03-12 LAB — CEA (IN HOUSE-CHCC): CEA (CHCC-In House): 6.07 ng/mL — ABNORMAL HIGH (ref 0.00–5.00)

## 2022-03-12 MED ORDER — SODIUM CHLORIDE 0.9% FLUSH
10.0000 mL | INTRAVENOUS | Status: DC | PRN
  Administered 2022-03-12: 10 mL

## 2022-03-12 MED ORDER — PROCHLORPERAZINE MALEATE 10 MG PO TABS: 10.0000 mg | ORAL_TABLET | Freq: Once | ORAL | Status: DC

## 2022-03-12 MED ORDER — SODIUM CHLORIDE 0.9 % IV SOLN
800.0000 mg/m2 | Freq: Once | INTRAVENOUS | Status: AC
  Administered 2022-03-12: 1254 mg via INTRAVENOUS
  Filled 2022-03-12: qty 26.3

## 2022-03-12 MED ORDER — SODIUM CHLORIDE 0.9 % IV SOLN: Freq: Once | INTRAVENOUS | Status: DC

## 2022-03-12 MED ORDER — HEPARIN SOD (PORK) LOCK FLUSH 100 UNIT/ML IV SOLN
500.0000 [IU] | Freq: Once | INTRAVENOUS | Status: AC | PRN
  Administered 2022-03-12: 500 [IU]

## 2022-03-12 MED ORDER — SODIUM CHLORIDE 0.9 % IV SOLN
1500.0000 mg | Freq: Once | INTRAVENOUS | Status: AC
  Administered 2022-03-12: 1500 mg via INTRAVENOUS
  Filled 2022-03-12: qty 30

## 2022-03-12 NOTE — Patient Instructions (Signed)

## 2022-03-12 NOTE — Patient Instructions (Signed)
[facility]  Discharge Instructions: Thank you for choosing [facility] to provide your oncology and hematology care.   If you have a lab appointment with the [facility], please go directly to the [facility] and check in at the registration area.  Wear comfortable clothing and clothing appropriate for easy access to any Portacath or PICC line.   We strive to give you quality time with your provider. You may need to reschedule your appointment if you arrive late (15 or more minutes).  Arriving late affects you and other patients whose appointments are after yours.  Also, if you miss three or more appointments without notifying the office, you may be dismissed from the clinic at the provider's discretion.      For prescription refill requests, have your pharmacy contact our office and allow 72 hours for refills to be completed.    Today you received the following chemotherapy and/or immunotherapy agents Gemzar      To help prevent nausea and vomiting after your treatment, we encourage you to take your nausea medication as directed.  BELOW ARE SYMPTOMS THAT SHOULD BE REPORTED IMMEDIATELY: *FEVER GREATER THAN 100.4 F (38 C) OR HIGHER *CHILLS OR SWEATING *NAUSEA AND VOMITING THAT IS NOT CONTROLLED WITH YOUR NAUSEA MEDICATION *UNUSUAL SHORTNESS OF BREATH *UNUSUAL BRUISING OR BLEEDING *URINARY PROBLEMS (pain or burning when urinating, or frequent urination) *BOWEL PROBLEMS (unusual diarrhea, constipation, pain near the anus) TENDERNESS IN MOUTH AND THROAT WITH OR WITHOUT PRESENCE OF ULCERS (sore throat, sores in mouth, or a toothache) UNUSUAL RASH, SWELLING OR PAIN  UNUSUAL VAGINAL DISCHARGE OR ITCHING   Items with * indicate a potential emergency and should be followed up as soon as possible or go to the Emergency Department if any problems should occur.  Please show the CHEMOTHERAPY ALERT CARD or IMMUNOTHERAPY ALERT CARD at check-in to the  Emergency Department and triage nurse. Should you have questions after your visit or need to cancel or reschedule your appointment, please contact [facility]  [phone] and follow the prompts.  Office hours are 8:00 a.m. to 4:30 p.m. Monday - Friday. Please note that voicemails left after 4:00 p.m. may not be returned until the following business day.  We are closed weekends and major holidays. You have access to a nurse at all times for urgent questions. Please call the main number to the clinic [phone] and follow the prompts.  For any non-urgent questions, you may also contact your provider using MyChart. We now offer e-Visits for anyone 62 and older to request care online for non-urgent symptoms. For details visit [email or web address].   Also download the MyChart app! Go to the app store, search "MyChart", open the app, select [facility], and log in with your MyChart username and password.  Masks are optional in the cancer centers. If you would like for your care team to wear a mask while they are taking care of you, please let them know. You may have one support person who is at least 86 years old accompany you for your appointments.

## 2022-03-12 NOTE — Progress Notes (Signed)
Hematology and Oncology Follow Up Visit  GREG ECKRICH [id number] 15-Feb-1929 88 y.o. 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 Musil is here today with her daughter for follow-up and treatment cycle 4. She is doing quite well and states that her fatigue is slowly improving. She has been able to resume walking for exercise.  CEA back in late September was 5.22 and CA 19-9 was 57. Today's result is pending.  Since receiving 2 unites of blood last month, her Hgb has increased from 8.1 to 12.2..   No fever, chills, n/v, cough, rash, dizziness, SOB, chest pain, palpitations, abdominal pain or changes in bowel or bladder habits.  No blood loss, bruising or petechiae noted.  No swelling, tenderness, numbness or tingling in her extremities.  No falls or syncope reported.  She states that her appetite is very good and she is doing her best to stay well hydrated. Her weight is noted to be down 7 lbs since her last visit. We will continue to watch this.    ECOG Performance Status: 2 - Symptomatic, <50% confined to bed  Medications:  Allergies as of 03/12/2022       Reactions   Buprenorphine Hcl Rash   Morphine And Related Rash        Medication List        Accurate as of March 12, 2022 10:38 AM. If you have any questions, ask your nurse or doctor.          Accu-Chek FastClix Lancets Misc USE AS DIRECTED TO CHECK  BLOOD SUGAR   Accu-Chek Guide test strip Generic drug: glucose blood Check blood sugars 1-2 times daily   acetaminophen 500 MG tablet Commonly known as: TYLENOL Take 2 tablets (1,000 mg total) by mouth 2 (two) times daily as needed for moderate pain.   albuterol 108 (90 Base) MCG/ACT inhaler Commonly known as: VENTOLIN HFA Inhale 2 puffs into the  lungs every 6 (six) hours as needed for wheezing or shortness of breath.   aspirin EC 81 MG tablet Take 81 mg by mouth daily.   atorvastatin 10 MG tablet Commonly known as: LIPITOR TAKE 1 TABLET BY MOUTH  DAILY   betamethasone dipropionate 0.05 % cream Apply topically 2 (two) times daily.   Centrum Silver 50+Women Tabs Take 1 tablet by mouth daily.   escitalopram 5 MG tablet Commonly known as: LEXAPRO TAKE 1 TABLET BY MOUTH DAILY   fluticasone 50 MCG/ACT nasal spray Commonly known as: FLONASE USE 2 SPRAYS IN BOTH  NOSTRILS DAILY   lisinopril 10 MG tablet Commonly known as: ZESTRIL TAKE 1 TABLET BY MOUTH DAILY   metFORMIN 500 MG tablet Commonly known as: GLUCOPHAGE TAKE 1 TABLET BY MOUTH TWICE  DAILY WITH A MEAL   mometasone-formoterol 200-5 MCG/ACT Aero Commonly known as: DULERA Inhale 1 puff into the lungs 2 (two) times daily.   nitroGLYCERIN 0.4 MG SL tablet Commonly known as: NITROSTAT DISSOLVE 1 TABLET UNDER THE TONGUE EVERY 5 MINUTES AS  NEEDED FOR CHEST PAIN. MAX  OF 3 TABLETS IN 15 MINUTES. CALL 911 IF PAIN PERSISTS.   omeprazole 20 MG capsule Commonly known as: PRILOSEC TAKE 1 CAPSULE BY MOUTH DAILY        Allergies:  Allergies  Allergen Reactions   Buprenorphine Hcl Rash   Morphine And  Related Rash    Past Medical History, Surgical history, Social history, and Family History were reviewed and updated.  Review of Systems: All other 10 point review of systems is negative.   Physical Exam:  height is _0  (1.549 m) and weight is 127 lb (57.6 kg).   Wt Readings from Last 3 Encounters:  03/12/22 127 lb (57.6 kg)  02/07/22 134 lb (60.8 kg)  01/11/22 130 lb 6.4 oz (59.1 kg)    Ocular: Sclerae unicteric, pupils equal, round and reactive to light Ear-nose-throat: Oropharynx clear, dentition fair Lymphatic: No cervical or supraclavicular adenopathy Lungs no rales or rhonchi, good excursion bilaterally Heart regular rate and rhythm, no murmur  appreciated Abd soft, nontender, positive bowel sounds MSK no focal spinal tenderness, no joint edema Neuro: non-focal, well-oriented, appropriate affect Breasts: Deferred  Lab Results  Component Value Date   WBC 2.9 (L) 02/11/2022   HGB 8.1 (L) 02/11/2022   HCT 25.8 (L) 02/11/2022   MCV 99.6 02/11/2022   PLT 279 02/11/2022   Lab Results  Component Value Date   IRON 57 10/14/2017   Lab Results  Component Value Date   RBC 2.59 (L) 02/11/2022   No results found for: "KPAFRELGTCHN", "LAMBDASER", "KAPLAMBRATIO" No results found for: "IGGSERUM", "IGA", "IGMSERUM" No results found for: "TOTALPROTELP", "ALBUMINELP", "A1GS", "A2GS", "BETS", "BETA2SER", "GAMS", "MSPIKE", "SPEI"   Chemistry      Component Value Date/Time   NA 138 02/11/2022 1130   K 4.4 02/11/2022 1130   CL 103 02/11/2022 1130   CO2 27 02/11/2022 1130   BUN 24 (H) 02/11/2022 1130   CREATININE 0.88 02/11/2022 1130   CREATININE 0.92 06/29/2021 1450      Component Value Date/Time   CALCIUM 9.3 02/11/2022 1130   ALKPHOS 47 02/11/2022 1130   AST 20 02/11/2022 1130   ALT 12 02/11/2022 1130   BILITOT 0.4 02/11/2022 1130       Impression and Plan: Ms. 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  We will proceed with treatment today per MD.  We will repeat her MRI of the liver in 2 weeks and follow-up with MD in 4 weeks.   Lottie Dawson, NP 11/28/202310:38 AM

## 2022-03-13 ENCOUNTER — Encounter: Payer: Self-pay | Admitting: Hematology & Oncology

## 2022-03-13 ENCOUNTER — Other Ambulatory Visit: Payer: Self-pay | Admitting: Hematology & Oncology

## 2022-03-14 LAB — CANCER ANTIGEN 19-9: CA 19-9: 61 U/mL — ABNORMAL HIGH (ref 0–35)

## 2022-03-15 ENCOUNTER — Encounter: Payer: Self-pay | Admitting: *Deleted

## 2022-03-15 ENCOUNTER — Encounter: Payer: Self-pay | Admitting: Family

## 2022-03-15 NOTE — Progress Notes (Signed)
Patient will need an MRI. Scheduled for 03/26/22.  Called and spoke to patient's daughter, Vita Barley. She is aware of appointment date, time and location. She is also aware of NPO for 4hrs.  Oncology Nurse Navigator Documentation     03/15/2022    9:00 AM  Oncology Nurse Navigator Flowsheets  Navigator Follow Up Date: 03/26/2022  Navigator Follow Up Reason: Scan Review  Navigator Location [facility]  Navigator Encounter Type Appt/Treatment Plan Review;Telephone  Telephone Appt Confirmation/Clarification;Education;Outgoing Call  Patient Visit Type MedOnc  Treatment Phase Active Tx  Barriers/Navigation Needs Coordination of Care  Education Other  Interventions Coordination of Care;Education  Acuity Level 1-No Barriers  Coordination of Care Appts  Education Method Verbal;Teach-back  Support Groups/Services Friends and Family  Time Spent with Patient 72

## 2022-03-19 ENCOUNTER — Inpatient Hospital Stay: Payer: Medicare Other

## 2022-03-19 ENCOUNTER — Ambulatory Visit: Payer: Medicare Other | Admitting: Family

## 2022-03-19 ENCOUNTER — Other Ambulatory Visit: Payer: Medicare Other

## 2022-03-19 ENCOUNTER — Ambulatory Visit: Payer: Medicare Other

## 2022-03-20 NOTE — Telephone Encounter (Signed)
Received fax confirmation

## 2022-03-20 NOTE — Telephone Encounter (Signed)
Form completed and faxed back to [facility] at [phone]. Form sent for scanning.

## 2022-03-21 ENCOUNTER — Inpatient Hospital Stay: Payer: Medicare Other

## 2022-03-21 ENCOUNTER — Telehealth: Payer: Self-pay | Admitting: Medical Oncology

## 2022-03-21 ENCOUNTER — Inpatient Hospital Stay: Payer: Medicare Other | Attending: Hematology & Oncology

## 2022-03-21 ENCOUNTER — Inpatient Hospital Stay (HOSPITAL_BASED_OUTPATIENT_CLINIC_OR_DEPARTMENT_OTHER): Payer: Medicare Other | Admitting: Medical Oncology

## 2022-03-21 ENCOUNTER — Other Ambulatory Visit: Payer: Self-pay

## 2022-03-21 ENCOUNTER — Encounter: Payer: Self-pay | Admitting: Medical Oncology

## 2022-03-21 VITALS — BP 142/60 | HR 72 | Resp 17

## 2022-03-21 DIAGNOSIS — Z5112 Encounter for antineoplastic immunotherapy: Secondary | ICD-10-CM | POA: Diagnosis not present

## 2022-03-21 DIAGNOSIS — Z5111 Encounter for antineoplastic chemotherapy: Secondary | ICD-10-CM | POA: Insufficient documentation

## 2022-03-21 DIAGNOSIS — C259 Malignant neoplasm of pancreas, unspecified: Secondary | ICD-10-CM

## 2022-03-21 DIAGNOSIS — C253 Malignant neoplasm of pancreatic duct: Secondary | ICD-10-CM | POA: Diagnosis not present

## 2022-03-21 DIAGNOSIS — C787 Secondary malignant neoplasm of liver and intrahepatic bile duct: Secondary | ICD-10-CM

## 2022-03-21 LAB — CBC WITH DIFFERENTIAL (CANCER CENTER ONLY)
Abs Immature Granulocytes: 0.01 10*3/uL (ref 0.00–0.07)
Basophils Absolute: 0 10*3/uL (ref 0.0–0.1)
Basophils Relative: 0 %
Eosinophils Absolute: 0.1 10*3/uL (ref 0.0–0.5)
Eosinophils Relative: 3 %
HCT: 33.7 % — ABNORMAL LOW (ref 36.0–46.0)
Hemoglobin: 10.9 g/dL — ABNORMAL LOW (ref 12.0–15.0)
Immature Granulocytes: 0 %
Lymphocytes Relative: 35 %
Lymphs Abs: 1.4 10*3/uL (ref 0.7–4.0)
MCH: 30.4 pg (ref 26.0–34.0)
MCHC: 32.3 g/dL (ref 30.0–36.0)
MCV: 93.9 fL (ref 80.0–100.0)
Monocytes Absolute: 0.5 10*3/uL (ref 0.1–1.0)
Monocytes Relative: 11 %
Neutro Abs: 2.1 10*3/uL (ref 1.7–7.7)
Neutrophils Relative %: 51 %
Platelet Count: 109 10*3/uL — ABNORMAL LOW (ref 150–400)
RBC: 3.59 MIL/uL — ABNORMAL LOW (ref 3.87–5.11)
RDW: 14.5 % (ref 11.5–15.5)
WBC Count: 4 10*3/uL (ref 4.0–10.5)
nRBC: 0 % (ref 0.0–0.2)

## 2022-03-21 LAB — CMP (CANCER CENTER ONLY)
ALT: 13 U/L (ref 0–44)
AST: 20 U/L (ref 15–41)
Albumin: 4 g/dL (ref 3.5–5.0)
Alkaline Phosphatase: 48 U/L (ref 38–126)
Anion gap: 7 (ref 5–15)
BUN: 28 mg/dL — ABNORMAL HIGH (ref 8–23)
CO2: 29 mmol/L (ref 22–32)
Calcium: 9.9 mg/dL (ref 8.9–10.3)
Chloride: 101 mmol/L (ref 98–111)
Creatinine: 0.88 mg/dL (ref 0.44–1.00)
GFR, Estimated: >60 mL/min (ref >60)
Glucose, Bld: 111 mg/dL — ABNORMAL HIGH (ref 70–99)
Potassium: 4.3 mmol/L (ref 3.5–5.1)
Sodium: 137 mmol/L (ref 135–145)
Total Bilirubin: 0.3 mg/dL (ref 0.3–1.2)
Total Protein: 7 g/dL (ref 6.5–8.1)

## 2022-03-21 LAB — CEA (ACCESS): CEA (CHCC): 6.6 ng/mL — ABNORMAL HIGH (ref 0.00–5.00)

## 2022-03-21 LAB — LACTATE DEHYDROGENASE: LDH: 137 U/L (ref 98–192)

## 2022-03-21 MED ORDER — SODIUM CHLORIDE 0.9 % IV SOLN: Freq: Once | INTRAVENOUS | Status: AC

## 2022-03-21 MED ORDER — SODIUM CHLORIDE 0.9 % IV SOLN
800.0000 mg/m2 | Freq: Once | INTRAVENOUS | Status: AC
  Administered 2022-03-21: 1254 mg via INTRAVENOUS
  Filled 2022-03-21: qty 26.3

## 2022-03-21 MED ORDER — PROCHLORPERAZINE MALEATE 10 MG PO TABS
10.0000 mg | ORAL_TABLET | Freq: Once | ORAL | Status: AC
  Administered 2022-03-21: 10 mg via ORAL
  Filled 2022-03-21: qty 1

## 2022-03-21 NOTE — Telephone Encounter (Signed)
Spoke with patient confirming lab appointments

## 2022-03-21 NOTE — Patient Instructions (Signed)

## 2022-03-21 NOTE — Patient Instructions (Signed)
[facility]  Discharge Instructions: Thank you for choosing [facility] to provide your oncology and hematology care.   If you have a lab appointment with the [facility], please go directly to the [facility] and check in at the registration area.  Wear comfortable clothing and clothing appropriate for easy access to any Portacath or PICC line.   We strive to give you quality time with your provider. You may need to reschedule your appointment if you arrive late (15 or more minutes).  Arriving late affects you and other patients whose appointments are after yours.  Also, if you miss three or more appointments without notifying the office, you may be dismissed from the clinic at the provider's discretion.      For prescription refill requests, have your pharmacy contact our office and allow 72 hours for refills to be completed.    Today you received the following chemotherapy and/or immunotherapy agents gemzar    To help prevent nausea and vomiting after your treatment, we encourage you to take your nausea medication as directed.  BELOW ARE SYMPTOMS THAT SHOULD BE REPORTED IMMEDIATELY: *FEVER GREATER THAN 100.4 F (38 C) OR HIGHER *CHILLS OR SWEATING *NAUSEA AND VOMITING THAT IS NOT CONTROLLED WITH YOUR NAUSEA MEDICATION *UNUSUAL SHORTNESS OF BREATH *UNUSUAL BRUISING OR BLEEDING *URINARY PROBLEMS (pain or burning when urinating, or frequent urination) *BOWEL PROBLEMS (unusual diarrhea, constipation, pain near the anus) TENDERNESS IN MOUTH AND THROAT WITH OR WITHOUT PRESENCE OF ULCERS (sore throat, sores in mouth, or a toothache) UNUSUAL RASH, SWELLING OR PAIN  UNUSUAL VAGINAL DISCHARGE OR ITCHING   Items with * indicate a potential emergency and should be followed up as soon as possible or go to the Emergency Department if any problems should occur.  Please show the CHEMOTHERAPY ALERT CARD or IMMUNOTHERAPY ALERT CARD at check-in to the  Emergency Department and triage nurse. Should you have questions after your visit or need to cancel or reschedule your appointment, please contact [facility]  [phone] and follow the prompts.  Office hours are 8:00 a.m. to 4:30 p.m. Monday - Friday. Please note that voicemails left after 4:00 p.m. may not be returned until the following business day.  We are closed weekends and major holidays. You have access to a nurse at all times for urgent questions. Please call the main number to the clinic [phone] and follow the prompts.  For any non-urgent questions, you may also contact your provider using MyChart. We now offer e-Visits for anyone 56 and older to request care online for non-urgent symptoms. For details visit [email or web address].   Also download the MyChart app! Go to the app store, search "MyChart", open the app, select [facility], and log in with your MyChart username and password.  Masks are optional in the cancer centers. If you would like for your care team to wear a mask while they are taking care of you, please let them know. You may have one support person who is at least 86 years old accompany you for your appointments.

## 2022-03-21 NOTE — Progress Notes (Signed)
Hematology and Oncology Follow Up Visit  Angelica Benson [id number] [birth year] 88 y.o. 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   Fatigue is stable.  Had one nosebleed the other day that was easily able to be stopped.  CEA at her last visit on 03/12/2022 was 6.07 up slightly from 5.22 2 months ago. CA 19-9 was 61 on 03/12/2022 from 57 2 months ago Last blood transfusion was on 02/12/2022  No fever, chills, n/v, cough, rash, dizziness, SOB, chest pain, palpitations, abdominal pain or changes in bowel or bladder habits.  No blood loss, bruising or petechiae noted.  No swelling, tenderness, numbness or tingling in her extremities.  No falls or syncope reported.  She states that her appetite is very good - weight is actually up! Wt Readings from Last 3 Encounters:  03/21/22 130 lb 1.3 oz (59 kg)  03/12/22 127 lb (57.6 kg)  02/07/22 134 lb (60.8 kg)       ECOG Performance Status: 2 - Symptomatic, <50% confined to bed  Medications:  Allergies as of 03/21/2022       Reactions   Buprenorphine Hcl Rash   Morphine And Related Rash        Medication List        Accurate as of March 21, 2022 11:42 AM. If you have any questions, ask your nurse or doctor.          Accu-Chek FastClix Lancets Misc USE AS DIRECTED TO CHECK  BLOOD SUGAR   Accu-Chek Guide test strip Generic drug: glucose blood Check blood sugars 1-2 times daily   acetaminophen 500 MG tablet Commonly known as: TYLENOL Take 2 tablets (1,000 mg total) by mouth 2 (two) times daily as needed for moderate pain.   albuterol 108 (90 Base) MCG/ACT inhaler Commonly known as: VENTOLIN HFA USE 2 INHALATIONS BY  MOUTH EVERY 6 HOURS AS NEEDED FOR WHEEZING  OR SHORTNESS OF BREATH   aspirin EC 81 MG tablet Take 81 mg by mouth daily.   atorvastatin 10 MG tablet Commonly known as: LIPITOR TAKE 1 TABLET BY MOUTH  DAILY   betamethasone dipropionate 0.05 % cream APPLY TOPICALLY TWICE DAILY   Centrum Silver 50+Women Tabs Take 1 tablet by mouth daily.   escitalopram 5 MG tablet Commonly known as: LEXAPRO TAKE 1 TABLET BY MOUTH DAILY   fluticasone 50 MCG/ACT nasal spray Commonly known as: FLONASE USE 2 SPRAYS IN BOTH NOSTRILS  DAILY   lisinopril 10 MG tablet Commonly known as: ZESTRIL TAKE 1 TABLET BY MOUTH DAILY   metFORMIN 500 MG tablet Commonly known as: GLUCOPHAGE TAKE 1 TABLET BY MOUTH TWICE  DAILY WITH A MEAL   mometasone-formoterol 200-5 MCG/ACT Aero Commonly known as: DULERA Inhale 1 puff into the lungs 2 (two) times daily.   nitroGLYCERIN 0.4 MG SL tablet Commonly known as: NITROSTAT DISSOLVE 1 TABLET UNDER THE TONGUE EVERY 5 MINUTES AS  NEEDED FOR CHEST PAIN. MAX  OF 3 TABLETS IN 15 MINUTES. CALL 911 IF PAIN PERSISTS.   omeprazole 20 MG capsule Commonly known as: PRILOSEC TAKE 1 CAPSULE BY MOUTH DAILY        Allergies:  Allergies  Allergen Reactions   Buprenorphine Hcl Rash   Morphine And  Related Rash    Past Medical History, Surgical history, Social history, and Family History were reviewed and updated.  Review of Systems: All other 10 point review of systems is negative.   Physical Exam:  weight is 130 lb 1.3 oz (59 kg). Her oral temperature is 98.1 F (36.7 C). Her blood pressure is 150/43 (abnormal) and her pulse is 62. Her respiration is 17 and oxygen saturation is 100%.   Wt Readings from Last 3 Encounters:  03/21/22 130 lb 1.3 oz (59 kg)  03/12/22 127 lb (57.6 kg)  02/07/22 134 lb (60.8 kg)    Ocular: Sclerae unicteric, pupils equal, round and reactive to light Ear-nose-throat: Oropharynx clear, dentition fair Lymphatic: No cervical or  supraclavicular adenopathy Lungs no rales or rhonchi, good excursion bilaterally Heart regular rate and rhythm, no murmur appreciated Abd soft, nontender, positive bowel sounds MSK no focal spinal tenderness, no joint edema Neuro: non-focal, well-oriented, appropriate affect Breasts: Deferred  Lab Results  Component Value Date   WBC 4.0 03/21/2022   HGB 10.9 (L) 03/21/2022   HCT 33.7 (L) 03/21/2022   MCV 93.9 03/21/2022   PLT 109 (L) 03/21/2022   Lab Results  Component Value Date   IRON 57 10/14/2017   Lab Results  Component Value Date   RBC 3.59 (L) 03/21/2022   No results found for: "KPAFRELGTCHN", "LAMBDASER", "KAPLAMBRATIO" No results found for: "IGGSERUM", "IGA", "IGMSERUM" No results found for: "TOTALPROTELP", "ALBUMINELP", "A1GS", "A2GS", "BETS", "BETA2SER", "GAMS", "MSPIKE", "SPEI"   Chemistry      Component Value Date/Time   NA 138 03/12/2022 1021   K 4.4 03/12/2022 1021   CL 101 03/12/2022 1021   CO2 30 03/12/2022 1021   BUN 28 (H) 03/12/2022 1021   CREATININE 0.96 03/12/2022 1021   CREATININE 0.92 06/29/2021 1450      Component Value Date/Time   CALCIUM 10.1 03/12/2022 1021   ALKPHOS 56 03/12/2022 1021   AST 23 03/12/2022 1021   ALT 14 03/12/2022 1021   BILITOT 0.5 03/12/2022 1021       Impression and Plan: Angelica Benson is a very pleasant 88 yo African American female with 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  We will proceed with treatment today- Cycle 5 Gemcitabine/Durvalumab Due to her drop in platelets I would like for her to have labs drawn in 1 week to ensure they are stable and that she does not require Nplate at that time.  Has MR scheduled on 03/26/2022   Disposition Cycle 5 Gemcitabine/Durvalumab today Lab visit on 03/26/2022 at [facility] for CBC w/  RTC as previously scheduled with Angelica Benson for cycle [place], PA-C 12/7/202311:42 AM

## 2022-03-23 LAB — CANCER ANTIGEN 19-9: CA 19-9: 56 U/mL — ABNORMAL HIGH (ref 0–35)

## 2022-03-26 ENCOUNTER — Inpatient Hospital Stay: Payer: Medicare Other

## 2022-03-26 ENCOUNTER — Other Ambulatory Visit: Payer: Self-pay

## 2022-03-26 ENCOUNTER — Ambulatory Visit (HOSPITAL_COMMUNITY)
Admission: RE | Admit: 2022-03-26 | Discharge: 2022-03-26 | Disposition: A | Discharge status: Home / Self Care | Payer: Medicare Other | Source: Ambulatory Visit | Attending: Family | Admitting: Family

## 2022-03-26 DIAGNOSIS — I81 Portal vein thrombosis: Secondary | ICD-10-CM | POA: Diagnosis not present

## 2022-03-26 DIAGNOSIS — K769 Liver disease, unspecified: Secondary | ICD-10-CM | POA: Diagnosis not present

## 2022-03-26 DIAGNOSIS — C259 Malignant neoplasm of pancreas, unspecified: Secondary | ICD-10-CM | POA: Diagnosis not present

## 2022-03-26 DIAGNOSIS — C787 Secondary malignant neoplasm of liver and intrahepatic bile duct: Secondary | ICD-10-CM | POA: Diagnosis not present

## 2022-03-26 DIAGNOSIS — Z9049 Acquired absence of other specified parts of digestive tract: Secondary | ICD-10-CM | POA: Diagnosis not present

## 2022-03-26 LAB — CBC WITH DIFFERENTIAL (CANCER CENTER ONLY)
Abs Immature Granulocytes: 0.01 10*3/uL (ref 0.00–0.07)
Basophils Absolute: 0 10*3/uL (ref 0.0–0.1)
Basophils Relative: 1 %
Eosinophils Absolute: 0.1 10*3/uL (ref 0.0–0.5)
Eosinophils Relative: 2 %
HCT: 35.8 % — ABNORMAL LOW (ref 36.0–46.0)
Hemoglobin: 11.8 g/dL — ABNORMAL LOW (ref 12.0–15.0)
Immature Granulocytes: 0 %
Lymphocytes Relative: 29 %
Lymphs Abs: 1.4 10*3/uL (ref 0.7–4.0)
MCH: 31 pg (ref 26.0–34.0)
MCHC: 33 g/dL (ref 30.0–36.0)
MCV: 94 fL (ref 80.0–100.0)
Monocytes Absolute: 0.1 10*3/uL (ref 0.1–1.0)
Monocytes Relative: 2 %
Neutro Abs: 3.2 10*3/uL (ref 1.7–7.7)
Neutrophils Relative %: 66 %
Platelet Count: 159 10*3/uL (ref 150–400)
RBC: 3.81 MIL/uL — ABNORMAL LOW (ref 3.87–5.11)
RDW: 15 % (ref 11.5–15.5)
WBC Count: 4.7 10*3/uL (ref 4.0–10.5)
nRBC: 0 % (ref 0.0–0.2)

## 2022-03-26 LAB — CMP (CANCER CENTER ONLY)
ALT: 17 U/L (ref 0–44)
AST: 24 U/L (ref 15–41)
Albumin: 4 g/dL (ref 3.5–5.0)
Alkaline Phosphatase: 52 U/L (ref 38–126)
Anion gap: 7 (ref 5–15)
BUN: 27 mg/dL — ABNORMAL HIGH (ref 8–23)
CO2: 30 mmol/L (ref 22–32)
Calcium: 10.2 mg/dL (ref 8.9–10.3)
Chloride: 102 mmol/L (ref 98–111)
Creatinine: 0.82 mg/dL (ref 0.44–1.00)
GFR, Estimated: >60 mL/min (ref >60)
Glucose, Bld: 92 mg/dL (ref 70–99)
Potassium: 4.4 mmol/L (ref 3.5–5.1)
Sodium: 139 mmol/L (ref 135–145)
Total Bilirubin: 0.4 mg/dL (ref 0.3–1.2)
Total Protein: 6.9 g/dL (ref 6.5–8.1)

## 2022-03-26 MED ORDER — GADOBUTROL 1 MMOL/ML IV SOLN
6.0000 mL | Freq: Once | INTRAVENOUS | Status: AC | PRN
  Administered 2022-03-26: 6 mL via INTRAVENOUS

## 2022-03-27 ENCOUNTER — Encounter: Payer: Self-pay | Admitting: Hematology & Oncology

## 2022-03-27 ENCOUNTER — Encounter: Payer: Self-pay | Admitting: *Deleted

## 2022-03-27 NOTE — Telephone Encounter (Signed)
-----   Message from Volanda Napoleon, MD sent at 03/27/2022  1:13 PM EST ----- Call - the MRI is stable!!!  No cancer is growing!!! Laurey Arrow

## 2022-03-27 NOTE — Progress Notes (Signed)
MRI shows stable disease.  Oncology Nurse Navigator Documentation     03/27/2022   10:00 AM  Oncology Nurse Navigator Flowsheets  Navigator Follow Up Date: 04/10/2022  Navigator Follow Up Reason: Follow-up Appointment;Chemotherapy  Navigator Location [facility]  Navigator Encounter Type Scan Review  Patient Visit Type MedOnc  Treatment Phase Active Tx  Barriers/Navigation Needs Coordination of Care  Interventions None Required  Acuity Level 1-No Barriers  Support Groups/Services Friends and Family  Time Spent with Patient 15

## 2022-04-03 ENCOUNTER — Encounter: Payer: Self-pay | Admitting: Family

## 2022-04-06 ENCOUNTER — Other Ambulatory Visit: Payer: Self-pay | Admitting: Family

## 2022-04-07 ENCOUNTER — Other Ambulatory Visit: Payer: Self-pay | Admitting: Family

## 2022-04-09 ENCOUNTER — Ambulatory Visit: Payer: Medicare Other

## 2022-04-09 ENCOUNTER — Inpatient Hospital Stay: Payer: Medicare Other

## 2022-04-09 ENCOUNTER — Ambulatory Visit: Payer: Medicare Other | Admitting: Hematology & Oncology

## 2022-04-09 ENCOUNTER — Other Ambulatory Visit: Payer: Medicare Other

## 2022-04-10 ENCOUNTER — Encounter: Payer: Self-pay | Admitting: Hematology & Oncology

## 2022-04-10 ENCOUNTER — Other Ambulatory Visit: Payer: Self-pay

## 2022-04-10 ENCOUNTER — Inpatient Hospital Stay: Payer: Medicare Other

## 2022-04-10 ENCOUNTER — Inpatient Hospital Stay (HOSPITAL_BASED_OUTPATIENT_CLINIC_OR_DEPARTMENT_OTHER): Payer: Medicare Other | Admitting: Hematology & Oncology

## 2022-04-10 VITALS — BP 155/61 | HR 71 | Temp 97.9°F | Resp 17 | Ht 61.0 in | Wt 127.0 lb

## 2022-04-10 DIAGNOSIS — R160 Hepatomegaly, not elsewhere classified: Secondary | ICD-10-CM

## 2022-04-10 DIAGNOSIS — Z7189 Other specified counseling: Secondary | ICD-10-CM

## 2022-04-10 DIAGNOSIS — M8580 Other specified disorders of bone density and structure, unspecified site: Secondary | ICD-10-CM

## 2022-04-10 DIAGNOSIS — M1990 Unspecified osteoarthritis, unspecified site: Secondary | ICD-10-CM

## 2022-04-10 DIAGNOSIS — D638 Anemia in other chronic diseases classified elsewhere: Secondary | ICD-10-CM

## 2022-04-10 DIAGNOSIS — I5032 Chronic diastolic (congestive) heart failure: Secondary | ICD-10-CM

## 2022-04-10 DIAGNOSIS — G5603 Carpal tunnel syndrome, bilateral upper limbs: Secondary | ICD-10-CM

## 2022-04-10 DIAGNOSIS — R634 Abnormal weight loss: Secondary | ICD-10-CM

## 2022-04-10 DIAGNOSIS — C787 Secondary malignant neoplasm of liver and intrahepatic bile duct: Secondary | ICD-10-CM

## 2022-04-10 DIAGNOSIS — C253 Malignant neoplasm of pancreatic duct: Secondary | ICD-10-CM | POA: Diagnosis not present

## 2022-04-10 DIAGNOSIS — C259 Malignant neoplasm of pancreas, unspecified: Secondary | ICD-10-CM

## 2022-04-10 DIAGNOSIS — Z5111 Encounter for antineoplastic chemotherapy: Secondary | ICD-10-CM | POA: Diagnosis not present

## 2022-04-10 DIAGNOSIS — M5010 Cervical disc disorder with radiculopathy, unspecified cervical region: Secondary | ICD-10-CM

## 2022-04-10 DIAGNOSIS — F32A Depression, unspecified: Secondary | ICD-10-CM

## 2022-04-10 DIAGNOSIS — I951 Orthostatic hypotension: Secondary | ICD-10-CM

## 2022-04-10 DIAGNOSIS — G459 Transient cerebral ischemic attack, unspecified: Secondary | ICD-10-CM

## 2022-04-10 DIAGNOSIS — H903 Sensorineural hearing loss, bilateral: Secondary | ICD-10-CM

## 2022-04-10 DIAGNOSIS — E049 Nontoxic goiter, unspecified: Secondary | ICD-10-CM

## 2022-04-10 DIAGNOSIS — Z5112 Encounter for antineoplastic immunotherapy: Secondary | ICD-10-CM | POA: Diagnosis not present

## 2022-04-10 DIAGNOSIS — I6782 Cerebral ischemia: Secondary | ICD-10-CM

## 2022-04-10 DIAGNOSIS — G8929 Other chronic pain: Secondary | ICD-10-CM

## 2022-04-10 DIAGNOSIS — J45909 Unspecified asthma, uncomplicated: Secondary | ICD-10-CM

## 2022-04-10 DIAGNOSIS — E119 Type 2 diabetes mellitus without complications: Secondary | ICD-10-CM

## 2022-04-10 DIAGNOSIS — R109 Unspecified abdominal pain: Secondary | ICD-10-CM

## 2022-04-10 DIAGNOSIS — I10 Essential (primary) hypertension: Secondary | ICD-10-CM

## 2022-04-10 DIAGNOSIS — I509 Heart failure, unspecified: Secondary | ICD-10-CM

## 2022-04-10 DIAGNOSIS — R42 Dizziness and giddiness: Secondary | ICD-10-CM

## 2022-04-10 DIAGNOSIS — E785 Hyperlipidemia, unspecified: Secondary | ICD-10-CM

## 2022-04-10 LAB — CBC WITH DIFFERENTIAL (CANCER CENTER ONLY)
Abs Immature Granulocytes: 0.01 10*3/uL (ref 0.00–0.07)
Basophils Absolute: 0 10*3/uL (ref 0.0–0.1)
Basophils Relative: 1 %
Eosinophils Absolute: 0.1 10*3/uL (ref 0.0–0.5)
Eosinophils Relative: 2 %
HCT: 31.9 % — ABNORMAL LOW (ref 36.0–46.0)
Hemoglobin: 10.3 g/dL — ABNORMAL LOW (ref 12.0–15.0)
Immature Granulocytes: 0 %
Lymphocytes Relative: 29 %
Lymphs Abs: 1.6 10*3/uL (ref 0.7–4.0)
MCH: 30.2 pg (ref 26.0–34.0)
MCHC: 32.3 g/dL (ref 30.0–36.0)
MCV: 93.5 fL (ref 80.0–100.0)
Monocytes Absolute: 0.7 10*3/uL (ref 0.1–1.0)
Monocytes Relative: 12 %
Neutro Abs: 3.2 10*3/uL (ref 1.7–7.7)
Neutrophils Relative %: 56 %
Platelet Count: 415 10*3/uL — ABNORMAL HIGH (ref 150–400)
RBC: 3.41 MIL/uL — ABNORMAL LOW (ref 3.87–5.11)
RDW: 16.2 % — ABNORMAL HIGH (ref 11.5–15.5)
WBC Count: 5.5 10*3/uL (ref 4.0–10.5)
nRBC: 0 % (ref 0.0–0.2)

## 2022-04-10 LAB — CMP (CANCER CENTER ONLY)
ALT: 13 U/L (ref 0–44)
AST: 20 U/L (ref 15–41)
Albumin: 3.9 g/dL (ref 3.5–5.0)
Alkaline Phosphatase: 51 U/L (ref 38–126)
Anion gap: 8 (ref 5–15)
BUN: 26 mg/dL — ABNORMAL HIGH (ref 8–23)
CO2: 28 mmol/L (ref 22–32)
Calcium: 9.4 mg/dL (ref 8.9–10.3)
Chloride: 101 mmol/L (ref 98–111)
Creatinine: 0.95 mg/dL (ref 0.44–1.00)
GFR, Estimated: 57 mL/min — ABNORMAL LOW (ref >60)
Glucose, Bld: 101 mg/dL — ABNORMAL HIGH (ref 70–99)
Potassium: 4.4 mmol/L (ref 3.5–5.1)
Sodium: 137 mmol/L (ref 135–145)
Total Bilirubin: 0.3 mg/dL (ref 0.3–1.2)
Total Protein: 6.7 g/dL (ref 6.5–8.1)

## 2022-04-10 MED ORDER — SODIUM CHLORIDE 0.9 % IV SOLN
1500.0000 mg | Freq: Once | INTRAVENOUS | Status: AC
  Administered 2022-04-10: 1500 mg via INTRAVENOUS
  Filled 2022-04-10: qty 30

## 2022-04-10 MED ORDER — SODIUM CHLORIDE 0.9% FLUSH
10.0000 mL | INTRAVENOUS | Status: DC | PRN
  Administered 2022-04-10: 10 mL

## 2022-04-10 MED ORDER — HEPARIN SOD (PORK) LOCK FLUSH 100 UNIT/ML IV SOLN
500.0000 [IU] | Freq: Once | INTRAVENOUS | Status: AC | PRN
  Administered 2022-04-10: 500 [IU]

## 2022-04-10 MED ORDER — SODIUM CHLORIDE 0.9 % IV SOLN: Freq: Once | INTRAVENOUS | Status: AC

## 2022-04-10 MED ORDER — PROCHLORPERAZINE MALEATE 10 MG PO TABS
10.0000 mg | ORAL_TABLET | Freq: Once | ORAL | Status: AC
  Administered 2022-04-10: 10 mg via ORAL
  Filled 2022-04-10: qty 1

## 2022-04-10 MED ORDER — SODIUM CHLORIDE 0.9 % IV SOLN
800.0000 mg/m2 | Freq: Once | INTRAVENOUS | Status: AC
  Administered 2022-04-10: 1255 mg via INTRAVENOUS
  Filled 2022-04-10: qty 26.3

## 2022-04-10 NOTE — Patient Instructions (Signed)

## 2022-04-10 NOTE — Progress Notes (Signed)
Hematology and Oncology Follow Up Visit  KRYSTALYNN RIDGEWAY [id number] [birth year] 88 y.o. 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 Kindig is here today with her daughter for follow-up.  She she is doing quite nicely.  I must say that she really has a strong constitution.  She is 88 years old.  She goes to church every Sunday.  I saw pictures of her getting ready for church.  She dresses impeccably.  She had a MRI that was done on 03/26/2022.  Thankfully, the MRI showed that everything looked stable.  There is no evidence of disease progression.  There is no new cancer that was noted.  Her tumor markers showed a CA 19-9 of 56 which is holding steady.  She has had no problems with pain.  Her appetite has been good.  She had a wonderful Christmas and is looking forward to a nice New Year.  There has been no problems with bowels or bladder.  She has had no constipation.  She has had no bleeding.  There is been no leg swelling.  She has had no rashes.  There is been no cough or shortness of breath.  I must say that overall I would have to put her performance as a probably ECOG 2.  And consideration of treatment cycle 5.    Wt Readings from Last 3 Encounters:  04/10/22 127 lb (57.6 kg)  03/21/22 130 lb 1.3 oz (59 kg)  03/12/22 127 lb (57.6 kg)      Medications:  Allergies as of 04/10/2022       Reactions   Buprenorphine Hcl Rash   Morphine And Related Rash        Medication List        Accurate as of April 10, 2022  1:18 PM. If you have any questions, ask your nurse or doctor.          Accu-Chek FastClix Lancets Misc USE AS DIRECTED TO CHECK  BLOOD SUGAR   Accu-Chek Guide test strip Generic drug: glucose blood Check blood sugars 1-2 times daily    acetaminophen 500 MG tablet Commonly known as: TYLENOL Take 2 tablets (1,000 mg total) by mouth 2 (two) times daily as needed for moderate pain.   albuterol 108 (90 Base) MCG/ACT inhaler Commonly known as: VENTOLIN HFA USE 2 INHALATIONS BY MOUTH EVERY 6 HOURS AS NEEDED FOR WHEEZING  OR SHORTNESS OF BREATH   aspirin EC 81 MG tablet Take 81 mg by mouth daily.   atorvastatin 10 MG tablet Commonly known as: LIPITOR TAKE 1 TABLET BY MOUTH DAILY   betamethasone dipropionate 0.05 % cream APPLY TOPICALLY TO AFFECTED  AREA(S) TWICE DAILY   Centrum Silver 50+Women Tabs Take 1 tablet by mouth daily.   escitalopram 5 MG tablet Commonly known as: LEXAPRO TAKE 1 TABLET BY MOUTH DAILY   fluticasone 50 MCG/ACT nasal spray Commonly known as: FLONASE USE 2 SPRAYS IN BOTH NOSTRILS  DAILY   lisinopril 10 MG tablet Commonly known as: ZESTRIL TAKE 1 TABLET BY MOUTH DAILY   metFORMIN 500 MG tablet Commonly known as: GLUCOPHAGE TAKE 1 TABLET BY MOUTH TWICE  DAILY WITH A MEAL   mometasone-formoterol 200-5 MCG/ACT Aero Commonly known as: DULERA Inhale 1 puff into the lungs 2 (two) times daily.   nitroGLYCERIN 0.4 MG SL  tablet Commonly known as: NITROSTAT DISSOLVE 1 TABLET UNDER THE TONGUE EVERY 5 MINUTES AS  NEEDED FOR CHEST PAIN. MAX  OF 3 TABLETS IN 15 MINUTES. CALL 911 IF PAIN PERSISTS.   omeprazole 20 MG capsule Commonly known as: PRILOSEC TAKE 1 CAPSULE BY MOUTH DAILY        Allergies:  Allergies  Allergen Reactions   Buprenorphine Hcl Rash   Morphine And Related Rash    Past Medical History, Surgical history, Social history, and Family History were reviewed and updated.  Review of Systems: Review of Systems  Constitutional: Negative.   HENT: Negative.    Eyes: Negative.   Respiratory: Negative.    Cardiovascular: Negative.   Gastrointestinal: Negative.   Genitourinary: Negative.   Musculoskeletal: Negative.   Skin: Negative.   Neurological: Negative.    Endo/Heme/Allergies: Negative.   Psychiatric/Behavioral: Negative.        Physical Exam:  height is _0  (1.549 m) and weight is 127 lb (57.6 kg). Her oral temperature is 97.9 F (36.6 C). Her blood pressure is 155/61 (abnormal) and her pulse is 71. Her respiration is 17 and oxygen saturation is 100%.   Wt Readings from Last 3 Encounters:  04/10/22 127 lb (57.6 kg)  03/21/22 130 lb 1.3 oz (59 kg)  03/12/22 127 lb (57.6 kg)   Physical Exam Vitals reviewed.  HENT:     Head: Normocephalic and atraumatic.  Eyes:     Pupils: Pupils are equal, round, and reactive to light.  Cardiovascular:     Rate and Rhythm: Normal rate and regular rhythm.     Heart sounds: Normal heart sounds.  Pulmonary:     Effort: Pulmonary effort is normal.     Breath sounds: Normal breath sounds.  Abdominal:     General: Bowel sounds are normal.     Palpations: Abdomen is soft.  Musculoskeletal:        General: No tenderness or deformity. Normal range of motion.     Cervical back: Normal range of motion.  Lymphadenopathy:     Cervical: No cervical adenopathy.  Skin:    General: Skin is warm and dry.     Findings: No erythema or rash.  Neurological:     Mental Status: She is alert and oriented to person, place, and time.  Psychiatric:        Behavior: Behavior normal.        Thought Content: Thought content normal.        Judgment: Judgment normal.      Lab Results  Component Value Date   WBC 5.5 04/10/2022   HGB 10.3 (L) 04/10/2022   HCT 31.9 (L) 04/10/2022   MCV 93.5 04/10/2022   PLT 415 (H) 04/10/2022   Lab Results  Component Value Date   IRON 57 10/14/2017   Lab Results  Component Value Date   RBC 3.41 (L) 04/10/2022   No results found for: "KPAFRELGTCHN", "LAMBDASER", "KAPLAMBRATIO" No results found for: "IGGSERUM", "IGA", "IGMSERUM" No results found for: "TOTALPROTELP", "ALBUMINELP", "A1GS", "A2GS", "BETS", "BETA2SER", "GAMS", "MSPIKE", "SPEI"   Chemistry      Component  Value Date/Time   NA 137 04/10/2022 1210   K 4.4 04/10/2022 1210   CL 101 04/10/2022 1210   CO2 28 04/10/2022 1210   BUN 26 (H) 04/10/2022 1210   CREATININE 0.95 04/10/2022 1210   CREATININE 0.92 06/29/2021 1450      Component Value Date/Time   CALCIUM 9.4 04/10/2022 1210   ALKPHOS 51 04/10/2022 1210  AST 20 04/10/2022 1210   ALT 13 04/10/2022 1210   BILITOT 0.3 04/10/2022 1210       Impression and Plan: Ms. 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   Again, I am very impressed with her resilience.  We can clearly move ahead with treatment.  Her disease is stable.  Again if we have to make any changes, we could certainly use one of the targeted drugs for her FGFR2 mutation.  Again from day 1, this is been all about quality of life.  So far, her quality of life is doing very nicely.  We will go ahead and get her back to see [place] in another month.  I do not think we need to do another scan probably until February or March.  Volanda Napoleon, MD 12/27/20231:18 PM

## 2022-04-10 NOTE — Patient Instructions (Signed)
[facility]  Discharge Instructions: Thank you for choosing [facility] to provide your oncology and hematology care.   If you have a lab appointment with the [facility], please go directly to the [facility] and check in at the registration area.  Wear comfortable clothing and clothing appropriate for easy access to any Portacath or PICC line.   We strive to give you quality time with your provider. You may need to reschedule your appointment if you arrive late (15 or more minutes).  Arriving late affects you and other patients whose appointments are after yours.  Also, if you miss three or more appointments without notifying the office, you may be dismissed from the clinic at the provider's discretion.      For prescription refill requests, have your pharmacy contact our office and allow 72 hours for refills to be completed.    Today you received the following chemotherapy and/or immunotherapy agents gemzar,imfinzi    To help prevent nausea and vomiting after your treatment, we encourage you to take your nausea medication as directed.  BELOW ARE SYMPTOMS THAT SHOULD BE REPORTED IMMEDIATELY: *FEVER GREATER THAN 100.4 F (38 C) OR HIGHER *CHILLS OR SWEATING *NAUSEA AND VOMITING THAT IS NOT CONTROLLED WITH YOUR NAUSEA MEDICATION *UNUSUAL SHORTNESS OF BREATH *UNUSUAL BRUISING OR BLEEDING *URINARY PROBLEMS (pain or burning when urinating, or frequent urination) *BOWEL PROBLEMS (unusual diarrhea, constipation, pain near the anus) TENDERNESS IN MOUTH AND THROAT WITH OR WITHOUT PRESENCE OF ULCERS (sore throat, sores in mouth, or a toothache) UNUSUAL RASH, SWELLING OR PAIN  UNUSUAL VAGINAL DISCHARGE OR ITCHING   Items with * indicate a potential emergency and should be followed up as soon as possible or go to the Emergency Department if any problems should occur.  Please show the CHEMOTHERAPY ALERT CARD or IMMUNOTHERAPY ALERT CARD at check-in to  the Emergency Department and triage nurse. Should you have questions after your visit or need to cancel or reschedule your appointment, please contact [facility]  [phone] and follow the prompts.  Office hours are 8:00 a.m. to 4:30 p.m. Monday - Friday. Please note that voicemails left after 4:00 p.m. may not be returned until the following business day.  We are closed weekends and major holidays. You have access to a nurse at all times for urgent questions. Please call the main number to the clinic [phone] and follow the prompts.  For any non-urgent questions, you may also contact your provider using MyChart. We now offer e-Visits for anyone 24 and older to request care online for non-urgent symptoms. For details visit [email or web address].   Also download the MyChart app! Go to the app store, search "MyChart", open the app, select [facility], and log in with your MyChart username and password.

## 2022-04-11 ENCOUNTER — Other Ambulatory Visit: Payer: Self-pay

## 2022-04-16 ENCOUNTER — Encounter: Payer: Self-pay | Admitting: *Deleted

## 2022-04-16 ENCOUNTER — Inpatient Hospital Stay: Payer: 59

## 2022-04-16 ENCOUNTER — Inpatient Hospital Stay: Payer: 59 | Attending: Hematology & Oncology

## 2022-04-16 VITALS — BP 157/42 | HR 66 | Temp 98.1°F | Resp 16

## 2022-04-16 DIAGNOSIS — C787 Secondary malignant neoplasm of liver and intrahepatic bile duct: Secondary | ICD-10-CM

## 2022-04-16 DIAGNOSIS — C253 Malignant neoplasm of pancreatic duct: Secondary | ICD-10-CM | POA: Insufficient documentation

## 2022-04-16 DIAGNOSIS — Z5111 Encounter for antineoplastic chemotherapy: Secondary | ICD-10-CM | POA: Diagnosis not present

## 2022-04-16 LAB — CBC WITH DIFFERENTIAL (CANCER CENTER ONLY)
Abs Immature Granulocytes: 0.01 10*3/uL (ref 0.00–0.07)
Basophils Absolute: 0 10*3/uL (ref 0.0–0.1)
Basophils Relative: 1 %
Eosinophils Absolute: 0 10*3/uL (ref 0.0–0.5)
Eosinophils Relative: 1 %
HCT: 30.8 % — ABNORMAL LOW (ref 36.0–46.0)
Hemoglobin: 10 g/dL — ABNORMAL LOW (ref 12.0–15.0)
Immature Granulocytes: 0 %
Lymphocytes Relative: 30 %
Lymphs Abs: 1.4 10*3/uL (ref 0.7–4.0)
MCH: 31 pg (ref 26.0–34.0)
MCHC: 32.5 g/dL (ref 30.0–36.0)
MCV: 95.4 fL (ref 80.0–100.0)
Monocytes Absolute: 0.3 10*3/uL (ref 0.1–1.0)
Monocytes Relative: 5 %
Neutro Abs: 3.1 10*3/uL (ref 1.7–7.7)
Neutrophils Relative %: 63 %
Platelet Count: 261 10*3/uL (ref 150–400)
RBC: 3.23 MIL/uL — ABNORMAL LOW (ref 3.87–5.11)
RDW: 16.1 % — ABNORMAL HIGH (ref 11.5–15.5)
WBC Count: 4.9 10*3/uL (ref 4.0–10.5)
nRBC: 0 % (ref 0.0–0.2)

## 2022-04-16 LAB — CMP (CANCER CENTER ONLY)
ALT: 16 U/L (ref 0–44)
AST: 22 U/L (ref 15–41)
Albumin: 3.9 g/dL (ref 3.5–5.0)
Alkaline Phosphatase: 49 U/L (ref 38–126)
Anion gap: 8 (ref 5–15)
BUN: 26 mg/dL — ABNORMAL HIGH (ref 8–23)
CO2: 28 mmol/L (ref 22–32)
Calcium: 9.4 mg/dL (ref 8.9–10.3)
Chloride: 101 mmol/L (ref 98–111)
Creatinine: 0.94 mg/dL (ref 0.44–1.00)
GFR, Estimated: 57 mL/min — ABNORMAL LOW (ref >60)
Glucose, Bld: 104 mg/dL — ABNORMAL HIGH (ref 70–99)
Potassium: 4.6 mmol/L (ref 3.5–5.1)
Sodium: 137 mmol/L (ref 135–145)
Total Bilirubin: 0.4 mg/dL (ref 0.3–1.2)
Total Protein: 7 g/dL (ref 6.5–8.1)

## 2022-04-16 MED ORDER — SODIUM CHLORIDE 0.9 % IV SOLN: Freq: Once | INTRAVENOUS | Status: AC

## 2022-04-16 MED ORDER — SODIUM CHLORIDE 0.9% FLUSH
10.0000 mL | INTRAVENOUS | Status: DC | PRN
  Administered 2022-04-16: 10 mL

## 2022-04-16 MED ORDER — HEPARIN SOD (PORK) LOCK FLUSH 100 UNIT/ML IV SOLN
500.0000 [IU] | Freq: Once | INTRAVENOUS | Status: AC | PRN
  Administered 2022-04-16: 500 [IU]

## 2022-04-16 MED ORDER — PROCHLORPERAZINE MALEATE 10 MG PO TABS: 10.0000 mg | ORAL_TABLET | Freq: Once | ORAL | Status: DC

## 2022-04-16 MED ORDER — SODIUM CHLORIDE 0.9 % IV SOLN: Freq: Once | INTRAVENOUS | Status: DC

## 2022-04-16 MED ORDER — SODIUM CHLORIDE 0.9 % IV SOLN
800.0000 mg/m2 | Freq: Once | INTRAVENOUS | Status: AC
  Administered 2022-04-16: 1255 mg via INTRAVENOUS
  Filled 2022-04-16: qty 26.3

## 2022-04-16 NOTE — Patient Instructions (Signed)
[facility]  Discharge Instructions: Thank you for choosing [facility] to provide your oncology and hematology care.   If you have a lab appointment with the [facility], please go directly to the [facility] and check in at the registration area.  Wear comfortable clothing and clothing appropriate for easy access to any Portacath or PICC line.   We strive to give you quality time with your provider. You may need to reschedule your appointment if you arrive late (15 or more minutes).  Arriving late affects you and other patients whose appointments are after yours.  Also, if you miss three or more appointments without notifying the office, you may be dismissed from the clinic at the provider's discretion.      For prescription refill requests, have your pharmacy contact our office and allow 72 hours for refills to be completed.    Today you received the following chemotherapy and/or immunotherapy agents Gemzar      To help prevent nausea and vomiting after your treatment, we encourage you to take your nausea medication as directed.  BELOW ARE SYMPTOMS THAT SHOULD BE REPORTED IMMEDIATELY: *FEVER GREATER THAN 100.4 F (38 C) OR HIGHER *CHILLS OR SWEATING *NAUSEA AND VOMITING THAT IS NOT CONTROLLED WITH YOUR NAUSEA MEDICATION *UNUSUAL SHORTNESS OF BREATH *UNUSUAL BRUISING OR BLEEDING *URINARY PROBLEMS (pain or burning when urinating, or frequent urination) *BOWEL PROBLEMS (unusual diarrhea, constipation, pain near the anus) TENDERNESS IN MOUTH AND THROAT WITH OR WITHOUT PRESENCE OF ULCERS (sore throat, sores in mouth, or a toothache) UNUSUAL RASH, SWELLING OR PAIN  UNUSUAL VAGINAL DISCHARGE OR ITCHING   Items with * indicate a potential emergency and should be followed up as soon as possible or go to the Emergency Department if any problems should occur.  Please show the CHEMOTHERAPY ALERT CARD or IMMUNOTHERAPY ALERT CARD at check-in to the  Emergency Department and triage nurse. Should you have questions after your visit or need to cancel or reschedule your appointment, please contact [facility]  [phone] and follow the prompts.  Office hours are 8:00 a.m. to 4:30 p.m. Monday - Friday. Please note that voicemails left after 4:00 p.m. may not be returned until the following business day.  We are closed weekends and major holidays. You have access to a nurse at all times for urgent questions. Please call the main number to the clinic [phone] and follow the prompts.  For any non-urgent questions, you may also contact your provider using MyChart. We now offer e-Visits for anyone 50 and older to request care online for non-urgent symptoms. For details visit [email or web address].   Also download the MyChart app! Go to the app store, search "MyChart", open the app, select [facility], and log in with your MyChart username and password.

## 2022-04-16 NOTE — Progress Notes (Signed)
Continues to tolerate treatment without significant side effects. No scans needed at this time.   Oncology Nurse Navigator Documentation     04/16/2022    9:00 AM  Oncology Nurse Navigator Flowsheets  Navigator Follow Up Date: 05/06/2022  Navigator Follow Up Reason: Follow-up Appointment;Chemotherapy  Navigator Location [facility]  Navigator Encounter Type Appt/Treatment Plan Review  Patient Visit Type MedOnc  Treatment Phase Active Tx  Barriers/Navigation Needs Coordination of Care  Interventions None Required  Acuity Level 1-No Barriers  Support Groups/Services Friends and Family  Time Spent with Patient 15

## 2022-04-16 NOTE — Patient Instructions (Signed)

## 2022-04-17 ENCOUNTER — Encounter: Payer: Self-pay | Admitting: Hematology & Oncology

## 2022-04-17 ENCOUNTER — Ambulatory Visit (INDEPENDENT_AMBULATORY_CARE_PROVIDER_SITE_OTHER): Payer: 59 | Admitting: Family

## 2022-04-17 ENCOUNTER — Other Ambulatory Visit (HOSPITAL_BASED_OUTPATIENT_CLINIC_OR_DEPARTMENT_OTHER): Payer: Self-pay

## 2022-04-17 VITALS — BP 132/42 | HR 66 | Temp 97.9°F | Resp 16 | Wt 130.0 lb

## 2022-04-17 DIAGNOSIS — E119 Type 2 diabetes mellitus without complications: Secondary | ICD-10-CM

## 2022-04-17 DIAGNOSIS — J45909 Unspecified asthma, uncomplicated: Secondary | ICD-10-CM | POA: Diagnosis not present

## 2022-04-17 DIAGNOSIS — Z23 Encounter for immunization: Secondary | ICD-10-CM

## 2022-04-17 DIAGNOSIS — I509 Heart failure, unspecified: Secondary | ICD-10-CM | POA: Diagnosis not present

## 2022-04-17 DIAGNOSIS — I10 Essential (primary) hypertension: Secondary | ICD-10-CM

## 2022-04-17 DIAGNOSIS — E785 Hyperlipidemia, unspecified: Secondary | ICD-10-CM | POA: Diagnosis not present

## 2022-04-17 LAB — LIPID PANEL
Cholesterol: 126 mg/dL (ref 0–200)
HDL: 71.1 mg/dL (ref >39.00)
LDL Cholesterol: 40 mg/dL (ref 0–99)
NonHDL: 55.19
Total CHOL/HDL Ratio: 2
Triglycerides: 75 mg/dL (ref 0.0–149.0)
VLDL: 15 mg/dL (ref 0.0–40.0)

## 2022-04-17 LAB — HEMOGLOBIN A1C: Hgb A1c MFr Bld: 6.4 % (ref 4.6–6.5)

## 2022-04-17 MED ORDER — COMIRNATY 30 MCG/0.3ML IM SUSY
PREFILLED_SYRINGE | INTRAMUSCULAR | 0 refills | Status: DC
  Filled 2022-04-17: qty 0.3, 1d supply, fill #0

## 2022-04-17 NOTE — Assessment & Plan Note (Signed)
Lab Results  Component Value Date   HGBA1C 5.6 06/29/2021   HGBA1C 6.2 04/28/2020   HGBA1C 6.0 10/26/2019   Lab Results  Component Value Date   MICROALBUR <0.7 04/04/2017   LDLCALC 44 06/29/2021   CREATININE 0.94 04/16/2022

## 2022-04-17 NOTE — Progress Notes (Signed)
Subjective:   By signing my name below, I, Shehryar Baig, attest that this documentation has been prepared under the direction and in the presence of Debbrah Alar, NP. 04/17/2022   Patient ID: Angelica Benson, female    DOB: [birth year], 89 y.o.   MRN: [id number]  Chief Complaint  Patient presents with   Follow-up    Will need documentation for evening home health aid    HPI Patient is in today for a follow up visit. She is present with her daughter during this visit.   Pancreatic cancer: She continues following up with her oncologist and is tolerating treatment well at this time.   Home health: She is requesting home health to come in for a few hours every evening. She currently lives alone in her apartment. She feels weak fears she will fall. She currently has an aide come in the morning to help her for 2 hours. She has occasional issues holding her bladder and bowels.   Asthma: She has no recent flare ups. She continues using Dulera inhaler daily and uses albuterol PRN.   Blood pressure: Her blood pressure is doing well while taking 10 mg lisinopril daily PO. She denies having any SOB.  BP Readings from Last 3 Encounters:  04/17/22 (!) 132/42  04/16/22 (!) 157/42  04/10/22 (!) 155/61   Pulse Readings from Last 3 Encounters:  04/17/22 66  04/16/22 66  04/10/22 71   Reflux: She denies having any recent episodes of reflux. She continues taking 20 mg Omeprazole daily PO.   Bone density: She is UTD on bone density scans.   Blood sugar: Her last A1c is stable.  Lab Results  Component Value Date   HGBA1C 6.4 04/17/2022   Immunizations: She does not have a flu vaccine this year and is interested in receiving it during this visit. She does not have the latest Covid-19 booster vaccine and is interested in receiving it at her pharmacy. She does not have the latest RSV vaccines and is interested in receiving it at her pharmacy.    Past Medical History:  Diagnosis Date    Arthritis    CHF (congestive heart failure) (HCC)    Chronic asthma    Chronic headaches    Constipation    Diabetes mellitus without complication ([place])    Goals of care, counseling/discussion 10/22/2021   Hemorrhoids without complication    Hypertension    Pancreatic cancer metastasized to liver ([place]) 10/22/2021    Past Surgical History:  Procedure Laterality Date   ABDOMINAL HYSTERECTOMY     BACK SURGERY     CHOLECYSTECTOMY     COLONOSCOPY  09/04/2001   Two rectal polyps. Small internal hemorrhoids   ESOPHAGOGASTRODUODENOSCOPY  09/04/2001   Two apparent antral polyps with one possible gastroesophageal juction polyp versus enlarged fold or focal gastritis   IR IMAGING GUIDED PORT INSERTION  11/05/2021   KNEE SURGERY      Family History  Problem Relation Age of Onset   Diabetes Son    Hypertension Son    Cancer Sister        breast   Diabetes Daughter        plus 2 grandkids   Stroke Son        34moage dx, has enlarged heart   Heart disease Son        passed away    Social History   Socioeconomic History   Marital status: Divorced    Spouse name: Not on file  Number of children: Not on file   Years of education: Not on file   Highest education level: Not on file  Occupational History   Not on file  Tobacco Use   Smoking status: Never   Smokeless tobacco: Never  Vaping Use   Vaping Use: Never used  Substance and Sexual Activity   Alcohol use: No   Drug use: No   Sexual activity: Not Currently  Other Topics Concern   Not on file  Social History Narrative   Lives at home by herself, aide in the am.     Has 4 daughters and 3 living sons (one son died).    Uses walker.     Divorced at age 65   Social Determinants of Health   Financial Resource Strain: Low Risk  (11/08/2021)   Overall Financial Resource Strain (CARDIA)    Difficulty of Paying Living Expenses: Not hard at all  Food Insecurity: No Food Insecurity (11/08/2021)   Hunger Vital Sign     Worried About Running Out of Food in the Last Year: Never true    Ran Out of Food in the Last Year: Never true  Transportation Needs: No Transportation Needs (11/08/2021)   PRAPARE - Hydrologist (Medical): No    Lack of Transportation (Non-Medical): No  Physical Activity: Not on file  Stress: Not on file  Social Connections: Moderately Integrated (11/08/2021)   Social Connection and Isolation Panel [NHANES]    Frequency of Communication with Friends and Family: More than three times a week    Frequency of Social Gatherings with Friends and Family: More than three times a week    Attends Religious Services: More than 4 times per year    Active Member of [employer] or Organizations: Yes    Attends Archivist Meetings: More than 4 times per year    Marital Status: Divorced  Intimate Partner Violence: Not At Risk (11/08/2021)   Humiliation, Afraid, Rape, and Kick questionnaire    Fear of Current or Ex-Partner: No    Emotionally Abused: No    Physically Abused: No    Sexually Abused: No    Outpatient Medications Prior to Visit  Medication Sig Dispense Refill   Accu-Chek FastClix Lancets MISC USE AS DIRECTED TO CHECK  BLOOD SUGAR 102 each 2   acetaminophen (TYLENOL) 500 MG tablet Take 2 tablets (1,000 mg total) by mouth 2 (two) times daily as needed for moderate pain. 30 tablet 0   albuterol (VENTOLIN HFA) 108 (90 Base) MCG/ACT inhaler USE 2 INHALATIONS BY MOUTH EVERY 6 HOURS AS NEEDED FOR WHEEZING  OR SHORTNESS OF BREATH 17 g 6   aspirin EC 81 MG tablet Take 81 mg by mouth daily.     atorvastatin (LIPITOR) 10 MG tablet TAKE 1 TABLET BY MOUTH DAILY 100 tablet 2   betamethasone dipropionate 0.05 % cream APPLY TOPICALLY TO AFFECTED  AREA(S) TWICE DAILY 30 g 0   escitalopram (LEXAPRO) 5 MG tablet TAKE 1 TABLET BY MOUTH DAILY 100 tablet 1   fluticasone (FLONASE) 50 MCG/ACT nasal spray USE 2 SPRAYS IN BOTH NOSTRILS  DAILY 64 g 11   glucose blood (ACCU-CHEK GUIDE)  test strip Check blood sugars 1-2 times daily 200 strip 12   lisinopril (ZESTRIL) 10 MG tablet TAKE 1 TABLET BY MOUTH DAILY 100 tablet 2   metFORMIN (GLUCOPHAGE) 500 MG tablet TAKE 1 TABLET BY MOUTH TWICE  DAILY WITH A MEAL 200 tablet 2   mometasone-formoterol (DULERA)  200-5 MCG/ACT AERO Inhale 1 puff into the lungs 2 (two) times daily. 39 g 1   Multiple Vitamins-Minerals (CENTRUM SILVER 50+WOMEN) TABS Take 1 tablet by mouth daily.     nitroGLYCERIN (NITROSTAT) 0.4 MG SL tablet DISSOLVE 1 TABLET UNDER THE TONGUE EVERY 5 MINUTES AS  NEEDED FOR CHEST PAIN. MAX  OF 3 TABLETS IN 15 MINUTES. CALL 911 IF PAIN PERSISTS. 25 tablet 1   omeprazole (PRILOSEC) 20 MG capsule TAKE 1 CAPSULE BY MOUTH DAILY 100 capsule 2   Facility-Administered Medications Prior to Visit  Medication Dose Route Frequency Provider Last Rate Last Admin   prochlorperazine (COMPAZINE) 10 MG tablet            sodium chloride flush (NS) 0.9 % injection 10 mL  10 mL Intravenous PRN Celso Amy, NP   10 mL at 11/29/21 1244    Allergies  Allergen Reactions   Buprenorphine Hcl Rash   Morphine And Related Rash    ROS    See HPI Objective:    Physical Exam Constitutional:      General: She is not in acute distress.    Appearance: Normal appearance. She is not ill-appearing.  HENT:     Head: Normocephalic and atraumatic.     Right Ear: External ear normal.     Left Ear: External ear normal.  Eyes:     Extraocular Movements: Extraocular movements intact.     Pupils: Pupils are equal, round, and reactive to light.  Cardiovascular:     Rate and Rhythm: Normal rate and regular rhythm.     Heart sounds: Normal heart sounds. No murmur heard.    No gallop.  Pulmonary:     Effort: Pulmonary effort is normal. No respiratory distress.     Breath sounds: Normal breath sounds. No wheezing or rales.  Skin:    General: Skin is warm and dry.  Neurological:     Mental Status: She is alert and oriented to person, place, and time.   Psychiatric:        Judgment: Judgment normal.     BP (!) 132/42 (BP Location: Right Arm, Patient Position: Sitting, Cuff Size: Small)   Pulse 66   Temp 97.9 F (36.6 C) (Oral)   Resp 16   Wt 130 lb (59 kg)   SpO2 100%   BMI 24.56 kg/m  Wt Readings from Last 3 Encounters:  04/17/22 130 lb (59 kg)  04/10/22 127 lb (57.6 kg)  03/21/22 130 lb 1.3 oz (59 kg)       Assessment & Plan:  Uncomplicated asthma, unspecified asthma severity, unspecified whether persistent Assessment & Plan: Stable on dulera and prn albuterol.    Benign essential hypertension Assessment & Plan: BP Readings from Last 3 Encounters:  04/17/22 (!) 132/42  04/16/22 (!) 157/42  04/10/22 (!) 155/61   Bp stable on lisinopril '10mg'$ .     Congestive heart failure, unspecified HF chronicity, unspecified heart failure type ([place]) Assessment & Plan: Clinically euvolemic- not on diuretic.    Hyperlipidemia, unspecified hyperlipidemia type Assessment & Plan: Lab Results  Component Value Date   CHOL 126 04/17/2022   HDL 71.10 04/17/2022   LDLCALC 40 04/17/2022   TRIG 75.0 04/17/2022   CHOLHDL 2 04/17/2022   Follow up lipid panel at goal.   Orders: -     Lipid panel  Type 2 diabetes mellitus without complication, without long-term current use of insulin (HCC) Assessment & Plan: Lab Results  Component Value Date   HGBA1C  5.6 06/29/2021   HGBA1C 6.2 04/28/2020   HGBA1C 6.0 10/26/2019   Lab Results  Component Value Date   MICROALBUR <0.7 04/04/2017   LDLCALC 44 06/29/2021   CREATININE 0.94 04/16/2022     Orders: -     Hemoglobin A1c  Needs flu shot -     Flu Vaccine QUAD High Dose(Fluad)  Diabetes mellitus without complication (HCC) Assessment & Plan: Continue metformin and diabetic diet. Last A1C at goal.      I, Nance Pear, NP, personally preformed the services described in this documentation.  All medical record entries made by the scribe were at my direction and in my  presence.  I have reviewed the chart and discharge instructions (if applicable) and agree that the record reflects my personal performance and is accurate and complete. 04/17/2022   I,Shehryar Baig,acting as a Education administrator for Nance Pear, NP.,have documented all relevant documentation on the behalf of Nance Pear, NP,as directed by  Nance Pear, NP while in the presence of Nance Pear, NP.   Nance Pear, NP

## 2022-04-17 NOTE — Assessment & Plan Note (Signed)
Clinically euvolemic- not on diuretic.

## 2022-04-17 NOTE — Assessment & Plan Note (Signed)
BP Readings from Last 3 Encounters:  04/17/22 (!) 132/42  04/16/22 (!) 157/42  04/10/22 (!) 155/61   Bp stable on lisinopril '10mg'$ .

## 2022-04-17 NOTE — Assessment & Plan Note (Signed)
Stable on dulera and prn albuterol.

## 2022-04-18 NOTE — Assessment & Plan Note (Signed)
Lab Results  Component Value Date   CHOL 126 04/17/2022   HDL 71.10 04/17/2022   LDLCALC 40 04/17/2022   TRIG 75.0 04/17/2022   CHOLHDL 2 04/17/2022   Follow up lipid panel at goal.

## 2022-04-18 NOTE — Assessment & Plan Note (Signed)
Continue metformin and diabetic diet. Last A1C at goal.

## 2022-04-24 ENCOUNTER — Encounter: Payer: Self-pay | Admitting: Hematology & Oncology

## 2022-04-26 ENCOUNTER — Other Ambulatory Visit: Payer: Self-pay

## 2022-04-29 ENCOUNTER — Encounter: Payer: Self-pay | Admitting: Hematology & Oncology

## 2022-05-01 ENCOUNTER — Encounter: Payer: Self-pay | Admitting: Podiatry

## 2022-05-01 ENCOUNTER — Ambulatory Visit (INDEPENDENT_AMBULATORY_CARE_PROVIDER_SITE_OTHER): Payer: 59 | Admitting: Podiatry

## 2022-05-01 VITALS — BP 100/66 | HR 80

## 2022-05-01 DIAGNOSIS — M79674 Pain in right toe(s): Secondary | ICD-10-CM | POA: Diagnosis not present

## 2022-05-01 DIAGNOSIS — M79675 Pain in left toe(s): Secondary | ICD-10-CM

## 2022-05-01 DIAGNOSIS — E119 Type 2 diabetes mellitus without complications: Secondary | ICD-10-CM

## 2022-05-01 DIAGNOSIS — B351 Tinea unguium: Secondary | ICD-10-CM | POA: Diagnosis not present

## 2022-05-01 NOTE — Progress Notes (Signed)
This patient returns to my office for at risk foot care.  This patient requires this care by a professional since this patient will be at risk due to having  Type 2 diabetes.  This patient is unable to cut nails herself since the patient cannot reach her nails.These nails are painful walking and wearing shoes.   Patient presents to the office with her daughter. This patient presents for at risk foot care today.  General Appearance  Alert, conversant and in no acute stress.  Vascular  Dorsalis pedis  are weakly  palpable  bilaterally.  Posterior tibial pulses are not palpable  B/L. Absent digital hair. Capillary return is within normal limits  bilaterally. Temperature is within normal limits  bilaterally.  Neurologic  Senn-Weinstein monofilament wire test diminished  bilaterally. Muscle power within normal limits bilaterally.  Nails Thick disfigured discolored nails with subungual debris  from hallux to fifth toes bilaterally. No evidence of bacterial infection or drainage bilaterally.  Orthopedic  No limitations of motion  feet .  No crepitus or effusions noted.  No bony pathology or digital deformities noted.  Skin  normotropic skin with no porokeratosis noted bilaterally.  No signs of infections or ulcers noted.     Onychomycosis  Pain in right toes  Pain in left toes  Consent was obtained for treatment procedures.   Mechanical debridement of nails 1-5  bilaterally performed with a nail nipper.  Filed with dremel without incident.    Return office visit  10 weeks                    Told patient to return for periodic foot care and evaluation due to potential at risk complications.   Gardiner Barefoot DPM

## 2022-05-03 ENCOUNTER — Other Ambulatory Visit: Payer: Self-pay

## 2022-05-06 ENCOUNTER — Other Ambulatory Visit: Payer: Self-pay

## 2022-05-06 ENCOUNTER — Encounter: Payer: Self-pay | Admitting: Hematology & Oncology

## 2022-05-06 ENCOUNTER — Inpatient Hospital Stay (HOSPITAL_BASED_OUTPATIENT_CLINIC_OR_DEPARTMENT_OTHER): Payer: 59 | Admitting: Hematology & Oncology

## 2022-05-06 ENCOUNTER — Inpatient Hospital Stay: Payer: 59

## 2022-05-06 VITALS — BP 145/53 | HR 64 | Temp 97.9°F | Resp 18 | Ht 61.0 in | Wt 124.0 lb

## 2022-05-06 DIAGNOSIS — C787 Secondary malignant neoplasm of liver and intrahepatic bile duct: Secondary | ICD-10-CM

## 2022-05-06 DIAGNOSIS — C253 Malignant neoplasm of pancreatic duct: Secondary | ICD-10-CM | POA: Diagnosis not present

## 2022-05-06 DIAGNOSIS — C259 Malignant neoplasm of pancreas, unspecified: Secondary | ICD-10-CM

## 2022-05-06 DIAGNOSIS — Z5111 Encounter for antineoplastic chemotherapy: Secondary | ICD-10-CM | POA: Diagnosis not present

## 2022-05-06 LAB — CBC WITH DIFFERENTIAL (CANCER CENTER ONLY)
Abs Immature Granulocytes: 0.02 10*3/uL (ref 0.00–0.07)
Basophils Absolute: 0 10*3/uL (ref 0.0–0.1)
Basophils Relative: 1 %
Eosinophils Absolute: 0.2 10*3/uL (ref 0.0–0.5)
Eosinophils Relative: 3 %
HCT: 31.9 % — ABNORMAL LOW (ref 36.0–46.0)
Hemoglobin: 10.1 g/dL — ABNORMAL LOW (ref 12.0–15.0)
Immature Granulocytes: 0 %
Lymphocytes Relative: 26 %
Lymphs Abs: 1.3 10*3/uL (ref 0.7–4.0)
MCH: 30.8 pg (ref 26.0–34.0)
MCHC: 31.7 g/dL (ref 30.0–36.0)
MCV: 97.3 fL (ref 80.0–100.0)
Monocytes Absolute: 0.6 10*3/uL (ref 0.1–1.0)
Monocytes Relative: 12 %
Neutro Abs: 2.9 10*3/uL (ref 1.7–7.7)
Neutrophils Relative %: 58 %
Platelet Count: 407 10*3/uL — ABNORMAL HIGH (ref 150–400)
RBC: 3.28 MIL/uL — ABNORMAL LOW (ref 3.87–5.11)
RDW: 17.8 % — ABNORMAL HIGH (ref 11.5–15.5)
WBC Count: 5.1 10*3/uL (ref 4.0–10.5)
nRBC: 0 % (ref 0.0–0.2)

## 2022-05-06 LAB — CMP (CANCER CENTER ONLY)
ALT: 13 U/L (ref 0–44)
AST: 20 U/L (ref 15–41)
Albumin: 4.2 g/dL (ref 3.5–5.0)
Alkaline Phosphatase: 51 U/L (ref 38–126)
Anion gap: 9 (ref 5–15)
BUN: 30 mg/dL — ABNORMAL HIGH (ref 8–23)
CO2: 28 mmol/L (ref 22–32)
Calcium: 9.9 mg/dL (ref 8.9–10.3)
Chloride: 100 mmol/L (ref 98–111)
Creatinine: 1.03 mg/dL — ABNORMAL HIGH (ref 0.44–1.00)
GFR, Estimated: 51 mL/min — ABNORMAL LOW (ref >60)
Glucose, Bld: 115 mg/dL — ABNORMAL HIGH (ref 70–99)
Potassium: 4.3 mmol/L (ref 3.5–5.1)
Sodium: 137 mmol/L (ref 135–145)
Total Bilirubin: 0.4 mg/dL (ref 0.3–1.2)
Total Protein: 6.9 g/dL (ref 6.5–8.1)

## 2022-05-06 LAB — CEA (IN HOUSE-CHCC): CEA (CHCC-In House): 5.36 ng/mL — ABNORMAL HIGH (ref 0.00–5.00)

## 2022-05-06 MED ORDER — SODIUM CHLORIDE 0.9 % IV SOLN
1500.0000 mg | Freq: Once | INTRAVENOUS | Status: AC
  Administered 2022-05-06: 1500 mg via INTRAVENOUS
  Filled 2022-05-06: qty 30

## 2022-05-06 MED ORDER — SODIUM CHLORIDE 0.9 % IV SOLN
800.0000 mg/m2 | Freq: Once | INTRAVENOUS | Status: AC
  Administered 2022-05-06: 1255 mg via INTRAVENOUS
  Filled 2022-05-06: qty 26.3

## 2022-05-06 MED ORDER — SODIUM CHLORIDE 0.9 % IV SOLN: Freq: Once | INTRAVENOUS | Status: AC

## 2022-05-06 MED ORDER — PROCHLORPERAZINE MALEATE 10 MG PO TABS
10.0000 mg | ORAL_TABLET | Freq: Once | ORAL | Status: DC
  Filled 2022-05-06: qty 1

## 2022-05-06 MED ORDER — SODIUM CHLORIDE 0.9% FLUSH
10.0000 mL | INTRAVENOUS | Status: DC | PRN
  Administered 2022-05-06: 10 mL

## 2022-05-06 MED ORDER — HEPARIN SOD (PORK) LOCK FLUSH 100 UNIT/ML IV SOLN
500.0000 [IU] | Freq: Once | INTRAVENOUS | Status: AC | PRN
  Administered 2022-05-06: 500 [IU]

## 2022-05-06 NOTE — Patient Instructions (Signed)
[facility]  Discharge Instructions: Thank you for choosing [facility] to provide your oncology and hematology care.   If you have a lab appointment with the [facility], please go directly to the [facility] and check in at the registration area.  Wear comfortable clothing and clothing appropriate for easy access to any Portacath or PICC line.   We strive to give you quality time with your provider. You may need to reschedule your appointment if you arrive late (15 or more minutes).  Arriving late affects you and other patients whose appointments are after yours.  Also, if you miss three or more appointments without notifying the office, you may be dismissed from the clinic at the provider's discretion.      For prescription refill requests, have your pharmacy contact our office and allow 72 hours for refills to be completed.    Today you received the following chemotherapy and/or immunotherapy agents:  Gemzar and Imfinzi      To help prevent nausea and vomiting after your treatment, we encourage you to take your nausea medication as directed.  BELOW ARE SYMPTOMS THAT SHOULD BE REPORTED IMMEDIATELY: *FEVER GREATER THAN 100.4 F (38 C) OR HIGHER *CHILLS OR SWEATING *NAUSEA AND VOMITING THAT IS NOT CONTROLLED WITH YOUR NAUSEA MEDICATION *UNUSUAL SHORTNESS OF BREATH *UNUSUAL BRUISING OR BLEEDING *URINARY PROBLEMS (pain or burning when urinating, or frequent urination) *BOWEL PROBLEMS (unusual diarrhea, constipation, pain near the anus) TENDERNESS IN MOUTH AND THROAT WITH OR WITHOUT PRESENCE OF ULCERS (sore throat, sores in mouth, or a toothache) UNUSUAL RASH, SWELLING OR PAIN  UNUSUAL VAGINAL DISCHARGE OR ITCHING   Items with * indicate a potential emergency and should be followed up as soon as possible or go to the Emergency Department if any problems should occur.  Please show the CHEMOTHERAPY ALERT CARD or IMMUNOTHERAPY ALERT CARD  at check-in to the Emergency Department and triage nurse. Should you have questions after your visit or need to cancel or reschedule your appointment, please contact [facility]  [phone] and follow the prompts.  Office hours are 8:00 a.m. to 4:30 p.m. Monday - Friday. Please note that voicemails left after 4:00 p.m. may not be returned until the following business day.  We are closed weekends and major holidays. You have access to a nurse at all times for urgent questions. Please call the main number to the clinic [phone] and follow the prompts.  For any non-urgent questions, you may also contact your provider using MyChart. We now offer e-Visits for anyone 55 and older to request care online for non-urgent symptoms. For details visit [email or web address].   Also download the MyChart app! Go to the app store, search "MyChart", open the app, select [facility], and log in with your MyChart username and password.

## 2022-05-06 NOTE — Progress Notes (Signed)
Hematology and Oncology Follow Up Visit  Angelica Benson [id number] 06/07/1928 89 y.o. 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  She continues to do amazingly with how well she is doing.  She really is maintaining a decent quality of life.  Despite the cold weather, she is doing well.  She does go to church.  She is eating it.  She has lost a little bit of weight.  Hopefully, she will be able to maintain her weight or gain little bit more.  Her last MRI that was done back in December showed everything looked relatively stable.  Her last tumor markers show that the CA 19-9 was 56 which is stable.  The CEA was 6.6 which is also stable.  She is not having any abdominal pain.  She is going to the bathroom.  There is no bleeding.  Overall, she does have some fatigue which is not surprising.   She does not have any rashes.  There is been no fever.  Thankfully, she is avoided COVID.  Overall, I would have to say that her performance status is probably ECOG 2.   Wt Readings from Last 3 Encounters:  05/06/22 124 lb (56.2 kg)  04/17/22 130 lb (59 kg)  04/10/22 127 lb (57.6 kg)      Medications:  Allergies as of 05/06/2022       Reactions   Buprenorphine Hcl Rash   Morphine And Related Rash        Medication List        Accurate as of May 06, 2022 12:11 PM. If you have any questions, ask your nurse or doctor.          Accu-Chek FastClix Lancets Misc USE AS DIRECTED TO CHECK  BLOOD SUGAR   Accu-Chek Guide test strip Generic drug: glucose blood Check blood sugars 1-2 times daily   acetaminophen 500 MG tablet Commonly known as: TYLENOL Take 2 tablets (1,000 mg total) by mouth 2 (two) times daily as needed  for moderate pain.   albuterol 108 (90 Base) MCG/ACT inhaler Commonly known as: VENTOLIN HFA USE 2 INHALATIONS BY MOUTH EVERY 6 HOURS AS NEEDED FOR WHEEZING  OR SHORTNESS OF BREATH   aspirin EC 81 MG tablet Take 81 mg by mouth daily.   atorvastatin 10 MG tablet Commonly known as: LIPITOR TAKE 1 TABLET BY MOUTH DAILY   betamethasone dipropionate 0.05 % cream APPLY TOPICALLY TO AFFECTED  AREA(S) TWICE DAILY   Centrum Silver 50+Women Tabs Take 1 tablet by mouth daily.   Comirnaty syringe Generic drug: COVID-19 mRNA vaccine 2023-2024 Inject into the muscle.   escitalopram 5 MG tablet Commonly known as: LEXAPRO TAKE 1 TABLET BY MOUTH DAILY   fluticasone 50 MCG/ACT nasal spray Commonly known as: FLONASE USE 2 SPRAYS IN BOTH NOSTRILS  DAILY   lisinopril 10 MG tablet Commonly known as: ZESTRIL TAKE 1 TABLET BY MOUTH DAILY   metFORMIN 500 MG tablet Commonly known as: GLUCOPHAGE TAKE 1 TABLET BY MOUTH TWICE  DAILY WITH A MEAL   mometasone-formoterol 200-5 MCG/ACT Aero Commonly known as: DULERA Inhale 1 puff into the lungs 2 (two) times daily.   nitroGLYCERIN 0.4 MG SL tablet Commonly known as: NITROSTAT DISSOLVE 1 TABLET UNDER THE TONGUE EVERY 5  MINUTES AS  NEEDED FOR CHEST PAIN. MAX  OF 3 TABLETS IN 15 MINUTES. CALL 911 IF PAIN PERSISTS.   omeprazole 20 MG capsule Commonly known as: PRILOSEC TAKE 1 CAPSULE BY MOUTH DAILY        Allergies:  Allergies  Allergen Reactions   Buprenorphine Hcl Rash   Morphine And Related Rash    Past Medical History, Surgical history, Social history, and Family History were reviewed and updated.  Review of Systems: Review of Systems  Constitutional: Negative.   HENT: Negative.    Eyes: Negative.   Respiratory: Negative.    Cardiovascular: Negative.   Gastrointestinal: Negative.   Genitourinary: Negative.   Musculoskeletal: Negative.   Skin: Negative.   Neurological: Negative.   Endo/Heme/Allergies: Negative.    Psychiatric/Behavioral: Negative.        Physical Exam:  height is '5\' 1"'$  (1.549 m) and weight is 124 lb (56.2 kg). Her oral temperature is 97.9 F (36.6 C). Her blood pressure is 145/53 (abnormal) and her pulse is 64. Her respiration is 18 and oxygen saturation is 98%.   Wt Readings from Last 3 Encounters:  05/06/22 124 lb (56.2 kg)  04/17/22 130 lb (59 kg)  04/10/22 127 lb (57.6 kg)   Physical Exam Vitals reviewed.  HENT:     Head: Normocephalic and atraumatic.  Eyes:     Pupils: Pupils are equal, round, and reactive to light.  Cardiovascular:     Rate and Rhythm: Normal rate and regular rhythm.     Heart sounds: Normal heart sounds.  Pulmonary:     Effort: Pulmonary effort is normal.     Breath sounds: Normal breath sounds.  Abdominal:     General: Bowel sounds are normal.     Palpations: Abdomen is soft.  Musculoskeletal:        General: No tenderness or deformity. Normal range of motion.     Cervical back: Normal range of motion.  Lymphadenopathy:     Cervical: No cervical adenopathy.  Skin:    General: Skin is warm and dry.     Findings: No erythema or rash.  Neurological:     Mental Status: She is alert and oriented to person, place, and time.  Psychiatric:        Behavior: Behavior normal.        Thought Content: Thought content normal.        Judgment: Judgment normal.     Lab Results  Component Value Date   WBC 5.1 05/06/2022   HGB 10.1 (L) 05/06/2022   HCT 31.9 (L) 05/06/2022   MCV 97.3 05/06/2022   PLT 407 (H) 05/06/2022   Lab Results  Component Value Date   IRON 57 10/14/2017   Lab Results  Component Value Date   RBC 3.28 (L) 05/06/2022   No results found for: "KPAFRELGTCHN", "LAMBDASER", "KAPLAMBRATIO" No results found for: "IGGSERUM", "IGA", "IGMSERUM" No results found for: "TOTALPROTELP", "ALBUMINELP", "A1GS", "A2GS", "BETS", "BETA2SER", "GAMS", "MSPIKE", "SPEI"   Chemistry      Component Value Date/Time   NA 137 04/16/2022 1200   K  4.6 04/16/2022 1200   CL 101 04/16/2022 1200   CO2 28 04/16/2022 1200   BUN 26 (H) 04/16/2022 1200   CREATININE 0.94 04/16/2022 1200   CREATININE 0.92 06/29/2021 1450      Component Value Date/Time   CALCIUM 9.4 04/16/2022 1200   ALKPHOS 49 04/16/2022 1200   AST 22 04/16/2022 1200   ALT 16 04/16/2022 1200   BILITOT 0.4 04/16/2022  1200       Impression and Plan: Angelica Benson is a very pleasant 89 yo African American female with 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   Again, I am very impressed with her resilience.  We can clearly move ahead with treatment.  Her disease is stable.  Her quality life is quite good.  Again if we have to make any changes, we could certainly use one of the targeted drugs for her FGFR2 mutation.  Again from day 1, this is been all about quality of life.  So far, her quality of life is doing very nicely.  We will go ahead and get her back to see [place] in another month.  I do not think we need to do another scan probably until March.  Volanda Napoleon, MD 1/22/202412:11 PM

## 2022-05-07 ENCOUNTER — Encounter: Payer: Self-pay | Admitting: *Deleted

## 2022-05-07 LAB — CANCER ANTIGEN 19-9: CA 19-9: 55 U/mL — ABNORMAL HIGH (ref 0–35)

## 2022-05-07 NOTE — Progress Notes (Signed)
Patient doing well with supportive family and no navigational needs. Will discontinue active navigation at this time. Will be available to patient or family in the future as needed.   Oncology Nurse Navigator Documentation     05/07/2022    9:00 AM  Oncology Nurse Navigator Flowsheets  Navigation Complete Date: 05/07/2022  Post Navigation: Continue to Follow Patient? No  Reason Not Navigating Patient: Patient On Maintenance Chemotherapy  Navigator Location [facility]  Navigator Encounter Type Appt/Treatment Plan Review  Patient Visit Type MedOnc  Treatment Phase Active Tx  Barriers/Navigation Needs Coordination of Care  Interventions None Required  Acuity Level 1-No Barriers  Support Groups/Services Friends and Family  Time Spent with Patient 15

## 2022-05-13 ENCOUNTER — Inpatient Hospital Stay: Payer: 59

## 2022-05-13 VITALS — BP 140/63 | HR 65 | Temp 98.0°F | Resp 17

## 2022-05-13 DIAGNOSIS — C787 Secondary malignant neoplasm of liver and intrahepatic bile duct: Secondary | ICD-10-CM

## 2022-05-13 DIAGNOSIS — Z5111 Encounter for antineoplastic chemotherapy: Secondary | ICD-10-CM | POA: Diagnosis not present

## 2022-05-13 DIAGNOSIS — C253 Malignant neoplasm of pancreatic duct: Secondary | ICD-10-CM | POA: Diagnosis not present

## 2022-05-13 LAB — CMP (CANCER CENTER ONLY)
ALT: 15 U/L (ref 0–44)
AST: 21 U/L (ref 15–41)
Albumin: 3.9 g/dL (ref 3.5–5.0)
Alkaline Phosphatase: 45 U/L (ref 38–126)
Anion gap: 7 (ref 5–15)
BUN: 23 mg/dL (ref 8–23)
CO2: 28 mmol/L (ref 22–32)
Calcium: 9.7 mg/dL (ref 8.9–10.3)
Chloride: 104 mmol/L (ref 98–111)
Creatinine: 0.93 mg/dL (ref 0.44–1.00)
GFR, Estimated: 58 mL/min — ABNORMAL LOW (ref >60)
Glucose, Bld: 117 mg/dL — ABNORMAL HIGH (ref 70–99)
Potassium: 4.7 mmol/L (ref 3.5–5.1)
Sodium: 139 mmol/L (ref 135–145)
Total Bilirubin: 0.3 mg/dL (ref 0.3–1.2)
Total Protein: 6.6 g/dL (ref 6.5–8.1)

## 2022-05-13 LAB — CBC WITH DIFFERENTIAL (CANCER CENTER ONLY)
Abs Immature Granulocytes: 0.01 10*3/uL (ref 0.00–0.07)
Basophils Absolute: 0 10*3/uL (ref 0.0–0.1)
Basophils Relative: 1 %
Eosinophils Absolute: 0.1 10*3/uL (ref 0.0–0.5)
Eosinophils Relative: 2 %
HCT: 29.5 % — ABNORMAL LOW (ref 36.0–46.0)
Hemoglobin: 9.3 g/dL — ABNORMAL LOW (ref 12.0–15.0)
Immature Granulocytes: 0 %
Lymphocytes Relative: 39 %
Lymphs Abs: 1.2 10*3/uL (ref 0.7–4.0)
MCH: 30.9 pg (ref 26.0–34.0)
MCHC: 31.5 g/dL (ref 30.0–36.0)
MCV: 98 fL (ref 80.0–100.0)
Monocytes Absolute: 0.3 10*3/uL (ref 0.1–1.0)
Monocytes Relative: 8 %
Neutro Abs: 1.6 10*3/uL — ABNORMAL LOW (ref 1.7–7.7)
Neutrophils Relative %: 50 %
Platelet Count: 240 10*3/uL (ref 150–400)
RBC: 3.01 MIL/uL — ABNORMAL LOW (ref 3.87–5.11)
RDW: 16.7 % — ABNORMAL HIGH (ref 11.5–15.5)
WBC Count: 3.2 10*3/uL — ABNORMAL LOW (ref 4.0–10.5)
nRBC: 0 % (ref 0.0–0.2)

## 2022-05-13 MED ORDER — HEPARIN SOD (PORK) LOCK FLUSH 100 UNIT/ML IV SOLN
500.0000 [IU] | Freq: Once | INTRAVENOUS | Status: AC | PRN
  Administered 2022-05-13: 500 [IU]

## 2022-05-13 MED ORDER — COLD PACK MISC ONCOLOGY: 1.0000 | Freq: Once | Status: DC | PRN

## 2022-05-13 MED ORDER — SODIUM CHLORIDE 0.9% FLUSH
10.0000 mL | INTRAVENOUS | Status: DC | PRN
  Administered 2022-05-13: 10 mL

## 2022-05-13 MED ORDER — PROCHLORPERAZINE MALEATE 10 MG PO TABS
10.0000 mg | ORAL_TABLET | Freq: Once | ORAL | Status: AC
  Administered 2022-05-13: 10 mg via ORAL
  Filled 2022-05-13: qty 1

## 2022-05-13 MED ORDER — SODIUM CHLORIDE 0.9 % IV SOLN
800.0000 mg/m2 | Freq: Once | INTRAVENOUS | Status: AC
  Administered 2022-05-13: 1255 mg via INTRAVENOUS
  Filled 2022-05-13: qty 26.3

## 2022-05-13 MED ORDER — SODIUM CHLORIDE 0.9 % IV SOLN: Freq: Once | INTRAVENOUS | Status: AC

## 2022-05-13 NOTE — Patient Instructions (Signed)
[facility]  Discharge Instructions: Thank you for choosing [facility] to provide your oncology and hematology care.   If you have a lab appointment with the Cancer Center, please go directly to the Cancer Center and check in at the registration area.  Wear comfortable clothing and clothing appropriate for easy access to any Portacath or PICC line.   We strive to give you quality time with your provider. You may need to reschedule your appointment if you arrive late (15 or more minutes).  Arriving late affects you and other patients whose appointments are after yours.  Also, if you miss three or more appointments without notifying the office, you may be dismissed from the clinic at the provider's discretion.      For prescription refill requests, have your pharmacy contact our office and allow 72 hours for refills to be completed.    Today you received the following chemotherapy and/or immunotherapy agents Gemzar   To help prevent nausea and vomiting after your treatment, we encourage you to take your nausea medication as directed.  BELOW ARE SYMPTOMS THAT SHOULD BE REPORTED IMMEDIATELY: *FEVER GREATER THAN 100.4 F (38 C) OR HIGHER *CHILLS OR SWEATING *NAUSEA AND VOMITING THAT IS NOT CONTROLLED WITH YOUR NAUSEA MEDICATION *UNUSUAL SHORTNESS OF BREATH *UNUSUAL BRUISING OR BLEEDING *URINARY PROBLEMS (pain or burning when urinating, or frequent urination) *BOWEL PROBLEMS (unusual diarrhea, constipation, pain near the anus) TENDERNESS IN MOUTH AND THROAT WITH OR WITHOUT PRESENCE OF ULCERS (sore throat, sores in mouth, or a toothache) UNUSUAL RASH, SWELLING OR PAIN  UNUSUAL VAGINAL DISCHARGE OR ITCHING   Items with * indicate a potential emergency and should be followed up as soon as possible or go to the Emergency Department if any problems should occur.  Please show the CHEMOTHERAPY ALERT CARD or IMMUNOTHERAPY ALERT CARD at check-in to  the Emergency Department and triage nurse. Should you have questions after your visit or need to cancel or reschedule your appointment, please contact [facility]  [phone] and follow the prompts.  Office hours are 8:00 a.m. to 4:30 p.m. Monday - Friday. Please note that voicemails left after 4:00 p.m. may not be returned until the following business day.  We are closed weekends and major holidays. You have access to a nurse at all times for urgent questions. Please call the main number to the clinic [phone] and follow the prompts.  For any non-urgent questions, you may also contact your provider using MyChart. We now offer e-Visits for anyone 18 and older to request care online for non-urgent symptoms. For details visit [email or web address].   Also download the MyChart app! Go to the app store, search "MyChart", open the app, select [facility], and log in with your MyChart username and password.  

## 2022-05-16 ENCOUNTER — Encounter: Payer: Self-pay | Admitting: Hematology & Oncology

## 2022-06-03 ENCOUNTER — Other Ambulatory Visit: Payer: 59

## 2022-06-03 ENCOUNTER — Inpatient Hospital Stay: Payer: 59

## 2022-06-03 ENCOUNTER — Ambulatory Visit: Payer: 59

## 2022-06-03 ENCOUNTER — Ambulatory Visit: Payer: 59 | Admitting: Hematology & Oncology

## 2022-06-06 ENCOUNTER — Encounter: Payer: Self-pay | Admitting: Family

## 2022-06-07 ENCOUNTER — Inpatient Hospital Stay: Payer: 59

## 2022-06-07 ENCOUNTER — Inpatient Hospital Stay: Payer: 59 | Attending: Hematology & Oncology

## 2022-06-07 ENCOUNTER — Encounter: Payer: Self-pay | Admitting: Hematology & Oncology

## 2022-06-07 ENCOUNTER — Inpatient Hospital Stay (HOSPITAL_BASED_OUTPATIENT_CLINIC_OR_DEPARTMENT_OTHER): Payer: 59 | Admitting: Hematology & Oncology

## 2022-06-07 DIAGNOSIS — Z79620 Long term (current) use of immunosuppressive biologic: Secondary | ICD-10-CM | POA: Diagnosis not present

## 2022-06-07 DIAGNOSIS — C259 Malignant neoplasm of pancreas, unspecified: Secondary | ICD-10-CM | POA: Diagnosis not present

## 2022-06-07 DIAGNOSIS — C787 Secondary malignant neoplasm of liver and intrahepatic bile duct: Secondary | ICD-10-CM

## 2022-06-07 DIAGNOSIS — C253 Malignant neoplasm of pancreatic duct: Secondary | ICD-10-CM | POA: Insufficient documentation

## 2022-06-07 DIAGNOSIS — Z5112 Encounter for antineoplastic immunotherapy: Secondary | ICD-10-CM | POA: Diagnosis not present

## 2022-06-07 LAB — CBC WITH DIFFERENTIAL (CANCER CENTER ONLY)
Abs Immature Granulocytes: 0.01 10*3/uL (ref 0.00–0.07)
Basophils Absolute: 0 10*3/uL (ref 0.0–0.1)
Basophils Relative: 1 %
Eosinophils Absolute: 0.1 10*3/uL (ref 0.0–0.5)
Eosinophils Relative: 3 %
HCT: 30.9 % — ABNORMAL LOW (ref 36.0–46.0)
Hemoglobin: 9.9 g/dL — ABNORMAL LOW (ref 12.0–15.0)
Immature Granulocytes: 0 %
Lymphocytes Relative: 26 %
Lymphs Abs: 1 10*3/uL (ref 0.7–4.0)
MCH: 32.2 pg (ref 26.0–34.0)
MCHC: 32 g/dL (ref 30.0–36.0)
MCV: 100.7 fL — ABNORMAL HIGH (ref 80.0–100.0)
Monocytes Absolute: 0.6 10*3/uL (ref 0.1–1.0)
Monocytes Relative: 14 %
Neutro Abs: 2.2 10*3/uL (ref 1.7–7.7)
Neutrophils Relative %: 56 %
Platelet Count: 291 10*3/uL (ref 150–400)
RBC: 3.07 MIL/uL — ABNORMAL LOW (ref 3.87–5.11)
RDW: 16.1 % — ABNORMAL HIGH (ref 11.5–15.5)
WBC Count: 3.9 10*3/uL — ABNORMAL LOW (ref 4.0–10.5)
nRBC: 0 % (ref 0.0–0.2)

## 2022-06-07 LAB — CMP (CANCER CENTER ONLY)
ALT: 13 U/L (ref 0–44)
AST: 23 U/L (ref 15–41)
Albumin: 4.1 g/dL (ref 3.5–5.0)
Alkaline Phosphatase: 53 U/L (ref 38–126)
Anion gap: 8 (ref 5–15)
BUN: 23 mg/dL (ref 8–23)
CO2: 29 mmol/L (ref 22–32)
Calcium: 9.6 mg/dL (ref 8.9–10.3)
Chloride: 101 mmol/L (ref 98–111)
Creatinine: 1.07 mg/dL — ABNORMAL HIGH (ref 0.44–1.00)
GFR, Estimated: 49 mL/min — ABNORMAL LOW (ref >60)
Glucose, Bld: 111 mg/dL — ABNORMAL HIGH (ref 70–99)
Potassium: 4.4 mmol/L (ref 3.5–5.1)
Sodium: 138 mmol/L (ref 135–145)
Total Bilirubin: 0.4 mg/dL (ref 0.3–1.2)
Total Protein: 6.9 g/dL (ref 6.5–8.1)

## 2022-06-07 LAB — TSH: TSH: 1.451 u[IU]/mL (ref 0.350–4.500)

## 2022-06-07 MED ORDER — SODIUM CHLORIDE 0.9 % IV SOLN: Freq: Once | INTRAVENOUS | Status: AC

## 2022-06-07 MED ORDER — SODIUM CHLORIDE 0.9% FLUSH
10.0000 mL | INTRAVENOUS | Status: DC | PRN
  Administered 2022-06-07: 10 mL

## 2022-06-07 MED ORDER — HEPARIN SOD (PORK) LOCK FLUSH 100 UNIT/ML IV SOLN
500.0000 [IU] | Freq: Once | INTRAVENOUS | Status: AC | PRN
  Administered 2022-06-07: 500 [IU]

## 2022-06-07 MED ORDER — SODIUM CHLORIDE 0.9% FLUSH: 3.0000 mL | INTRAVENOUS | Status: DC | PRN

## 2022-06-07 MED ORDER — SODIUM CHLORIDE 0.9 % IV SOLN
1500.0000 mg | Freq: Once | INTRAVENOUS | Status: AC
  Administered 2022-06-07: 1500 mg via INTRAVENOUS
  Filled 2022-06-07: qty 30

## 2022-06-07 NOTE — Progress Notes (Signed)
Hematology and Oncology Follow Up Visit  LATITIA CIMINI [id number] 1928/05/14 89 y.o. 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 Verney is here today with her daughter for follow-up.  She is getting weaker.  This is troublesome to me.  As such, I think we will going to have to hold on the gemcitabine today and next week.  However, I think we can probably go ahead with the Durvalumab.  I do think that we are probably going to have to do a MRI to see exactly where she stands with her cancer.  We have focused on her quality of life from day 1.  I want to make sure that we continue to focus on this.  When we saw her, her CA 19-9 was stable at 55.  The CEA was 5.4.  She has had no problems with bowels or bladder.  She has had no cough or shortness of breath.  She is eating okay.  Her weight is up a little bit.  She has had no leg swelling.  She has had no fever.  She has had no bleeding.  There has been no rashes.  She has had no diarrhea.  She has had no dysuria or urinary frequency.  Overall, I would have said that her performance status is probably ECOG 2-3.    Wt Readings from Last 3 Encounters:  06/07/22 130 lb (59 kg)  05/06/22 124 lb (56.2 kg)  04/17/22 130 lb (59 kg)      Medications:  Allergies as of 06/07/2022       Reactions   Buprenorphine Hcl Rash   Morphine And Related Rash        Medication List        Accurate as of June 07, 2022 12:30 PM. If you have any questions, ask your nurse or doctor.          STOP taking these medications    Comirnaty syringe Generic drug: COVID-19 mRNA vaccine 2023-2024 Stopped by: Volanda Napoleon, MD       TAKE these medications    Accu-Chek FastClix Lancets Misc USE AS DIRECTED TO CHECK  BLOOD SUGAR    Accu-Chek Guide test strip Generic drug: glucose blood Check blood sugars 1-2 times daily   acetaminophen 500 MG tablet Commonly known as: TYLENOL Take 2 tablets (1,000 mg total) by mouth 2 (two) times daily as needed for moderate pain.   albuterol 108 (90 Base) MCG/ACT inhaler Commonly known as: VENTOLIN HFA USE 2 INHALATIONS BY MOUTH EVERY 6 HOURS AS NEEDED FOR WHEEZING  OR SHORTNESS OF BREATH   aspirin EC 81 MG tablet Take 81 mg by mouth daily.   atorvastatin 10 MG tablet Commonly known as: LIPITOR TAKE 1 TABLET BY MOUTH DAILY   betamethasone dipropionate 0.05 % cream APPLY TOPICALLY TO AFFECTED  AREA(S) TWICE DAILY   Centrum Silver 50+Women Tabs Take 1 tablet by mouth daily.   escitalopram 5 MG tablet Commonly known as: LEXAPRO TAKE 1 TABLET BY MOUTH DAILY   fluticasone 50 MCG/ACT nasal spray Commonly known as: FLONASE USE 2 SPRAYS IN BOTH NOSTRILS  DAILY   lisinopril 10 MG tablet Commonly known as: ZESTRIL TAKE 1 TABLET BY MOUTH DAILY   metFORMIN 500 MG tablet Commonly known as: GLUCOPHAGE TAKE 1 TABLET BY MOUTH TWICE  DAILY WITH A MEAL   mometasone-formoterol 200-5 MCG/ACT Aero Commonly known as: DULERA Inhale 1 puff into the lungs 2 (two) times daily.   nitroGLYCERIN 0.4 MG SL tablet Commonly known as: NITROSTAT DISSOLVE 1 TABLET UNDER THE TONGUE EVERY 5 MINUTES AS  NEEDED FOR CHEST PAIN. MAX  OF 3 TABLETS IN 15 MINUTES. CALL 911 IF PAIN PERSISTS.   omeprazole 20 MG capsule Commonly known as: PRILOSEC TAKE 1 CAPSULE BY MOUTH DAILY        Allergies:  Allergies  Allergen Reactions   Buprenorphine Hcl Rash   Morphine And Related Rash    Past Medical History, Surgical history, Social history, and Family History were reviewed and updated.  Review of Systems: Review of Systems  Constitutional: Negative.   HENT: Negative.    Eyes: Negative.   Respiratory: Negative.    Cardiovascular: Negative.   Gastrointestinal: Negative.   Genitourinary:  Negative.   Musculoskeletal: Negative.   Skin: Negative.   Neurological: Negative.   Endo/Heme/Allergies: Negative.   Psychiatric/Behavioral: Negative.        Physical Exam:  height is '5\' 1"'$  (1.549 m) and weight is 130 lb (59 kg). Her oral temperature is 98.9 F (37.2 C). Her blood pressure is 138/52 (abnormal) and her pulse is 67. Her respiration is 20 and oxygen saturation is 100%.   Wt Readings from Last 3 Encounters:  06/07/22 130 lb (59 kg)  05/06/22 124 lb (56.2 kg)  04/17/22 130 lb (59 kg)   Physical Exam Vitals reviewed.  HENT:     Head: Normocephalic and atraumatic.  Eyes:     Pupils: Pupils are equal, round, and reactive to light.  Cardiovascular:     Rate and Rhythm: Normal rate and regular rhythm.     Heart sounds: Normal heart sounds.  Pulmonary:     Effort: Pulmonary effort is normal.     Breath sounds: Normal breath sounds.  Abdominal:     General: Bowel sounds are normal.     Palpations: Abdomen is soft.  Musculoskeletal:        General: No tenderness or deformity. Normal range of motion.     Cervical back: Normal range of motion.  Lymphadenopathy:     Cervical: No cervical adenopathy.  Skin:    General: Skin is warm and dry.     Findings: No erythema or rash.  Neurological:     Mental Status: She is alert and oriented to person, place, and time.  Psychiatric:        Behavior: Behavior normal.        Thought Content: Thought content normal.        Judgment: Judgment normal.      Lab Results  Component Value Date   WBC 3.9 (L) 06/07/2022   HGB 9.9 (L) 06/07/2022   HCT 30.9 (L) 06/07/2022   MCV 100.7 (H) 06/07/2022   PLT 291 06/07/2022   Lab Results  Component Value Date   IRON 57 10/14/2017   Lab Results  Component Value Date   RBC 3.07 (L) 06/07/2022   No results found for: "KPAFRELGTCHN", "LAMBDASER", "KAPLAMBRATIO" No results found for: "IGGSERUM", "IGA", "IGMSERUM" No results found for: "TOTALPROTELP", "ALBUMINELP", "A1GS",  "A2GS", "BETS", "BETA2SER", "GAMS", "MSPIKE", "SPEI"   Chemistry      Component Value Date/Time   NA 138 06/07/2022 1118   K 4.4 06/07/2022 1118   CL 101 06/07/2022 1118   CO2 29 06/07/2022 1118   BUN 23 06/07/2022 1118   CREATININE 1.07 (H)  06/07/2022 1118   CREATININE 0.92 06/29/2021 1450      Component Value Date/Time   CALCIUM 9.6 06/07/2022 1118   ALKPHOS 53 06/07/2022 1118   AST 23 06/07/2022 1118   ALT 13 06/07/2022 1118   BILITOT 0.4 06/07/2022 1118       Impression and Plan: Ms. 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   Again, we are going to hold on her chemotherapy for right now.  I will give her the Durvalumab.  I do not think this would be a problem with respect to her weakness.  We are going to have to get another MRI.  I would like to get this in about 3 weeks.  This will really tell us what we need to do in the future.  Of note, she does have the specific FGFR2 mutation.  As such, we might be able to use one of the oral targeted drugs.  I will plan to get her back here in 1 month.    Volanda Napoleon, MD 2/23/202412:30 PM

## 2022-06-07 NOTE — Patient Instructions (Signed)

## 2022-06-07 NOTE — Patient Instructions (Signed)
[facility]  Discharge Instructions: Thank you for choosing [facility] to provide your oncology and hematology care.   If you have a lab appointment with the [facility], please go directly to the [facility] and check in at the registration area.  Wear comfortable clothing and clothing appropriate for easy access to any Portacath or PICC line.   We strive to give you quality time with your provider. You may need to reschedule your appointment if you arrive late (15 or more minutes).  Arriving late affects you and other patients whose appointments are after yours.  Also, if you miss three or more appointments without notifying the office, you may be dismissed from the clinic at the provider's discretion.      For prescription refill requests, have your pharmacy contact our office and allow 72 hours for refills to be completed.    Today you received the following chemotherapy and/or immunotherapy agents Infimzi      To help prevent nausea and vomiting after your treatment, we encourage you to take your nausea medication as directed.  BELOW ARE SYMPTOMS THAT SHOULD BE REPORTED IMMEDIATELY: *FEVER GREATER THAN 100.4 F (38 C) OR HIGHER *CHILLS OR SWEATING *NAUSEA AND VOMITING THAT IS NOT CONTROLLED WITH YOUR NAUSEA MEDICATION *UNUSUAL SHORTNESS OF BREATH *UNUSUAL BRUISING OR BLEEDING *URINARY PROBLEMS (pain or burning when urinating, or frequent urination) *BOWEL PROBLEMS (unusual diarrhea, constipation, pain near the anus) TENDERNESS IN MOUTH AND THROAT WITH OR WITHOUT PRESENCE OF ULCERS (sore throat, sores in mouth, or a toothache) UNUSUAL RASH, SWELLING OR PAIN  UNUSUAL VAGINAL DISCHARGE OR ITCHING   Items with * indicate a potential emergency and should be followed up as soon as possible or go to the Emergency Department if any problems should occur.  Please show the CHEMOTHERAPY ALERT CARD or IMMUNOTHERAPY ALERT CARD at check-in  to the Emergency Department and triage nurse. Should you have questions after your visit or need to cancel or reschedule your appointment, please contact [facility]  [phone] and follow the prompts.  Office hours are 8:00 a.m. to 4:30 p.m. Monday - Friday. Please note that voicemails left after 4:00 p.m. may not be returned until the following business day.  We are closed weekends and major holidays. You have access to a nurse at all times for urgent questions. Please call the main number to the clinic [phone] and follow the prompts.  For any non-urgent questions, you may also contact your provider using MyChart. We now offer e-Visits for anyone 38 and older to request care online for non-urgent symptoms. For details visit [email or web address].   Also download the MyChart app! Go to the app store, search "MyChart", open the app, select [facility], and log in with your MyChart username and password.

## 2022-06-08 ENCOUNTER — Other Ambulatory Visit: Payer: Self-pay | Admitting: Family

## 2022-06-08 LAB — T4: T4, Total: 8.1 ug/dL (ref 4.5–12.0)

## 2022-06-12 ENCOUNTER — Other Ambulatory Visit: Payer: Self-pay | Admitting: Family

## 2022-06-12 DIAGNOSIS — F32A Depression, unspecified: Secondary | ICD-10-CM

## 2022-06-19 ENCOUNTER — Ambulatory Visit (HOSPITAL_COMMUNITY)
Admission: RE | Admit: 2022-06-19 | Discharge: 2022-06-19 | Disposition: A | Discharge status: Home / Self Care | Payer: 59 | Source: Ambulatory Visit | Attending: Hematology & Oncology | Admitting: Hematology & Oncology

## 2022-06-19 ENCOUNTER — Other Ambulatory Visit: Payer: Self-pay

## 2022-06-19 DIAGNOSIS — C259 Malignant neoplasm of pancreas, unspecified: Secondary | ICD-10-CM | POA: Diagnosis not present

## 2022-06-19 DIAGNOSIS — C787 Secondary malignant neoplasm of liver and intrahepatic bile duct: Secondary | ICD-10-CM | POA: Diagnosis not present

## 2022-06-19 DIAGNOSIS — R160 Hepatomegaly, not elsewhere classified: Secondary | ICD-10-CM | POA: Diagnosis not present

## 2022-06-19 MED ORDER — GADOBUTROL 1 MMOL/ML IV SOLN
6.0000 mL | Freq: Once | INTRAVENOUS | Status: AC | PRN
  Administered 2022-06-19: 6 mL via INTRAVENOUS

## 2022-06-19 MED ORDER — ACCU-CHEK FASTCLIX LANCETS MISC: 2 refills | Status: DC

## 2022-06-20 ENCOUNTER — Encounter: Payer: Self-pay | Admitting: *Deleted

## 2022-06-27 ENCOUNTER — Encounter: Payer: Self-pay | Admitting: Family

## 2022-07-01 NOTE — Telephone Encounter (Signed)
[name] advised form was received and she said is ok to wait until patient's pcp is back in the office Thursday to complete and fax

## 2022-07-02 ENCOUNTER — Other Ambulatory Visit: Payer: Self-pay | Admitting: Hematology & Oncology

## 2022-07-03 ENCOUNTER — Other Ambulatory Visit: Payer: Self-pay | Admitting: Hematology and Oncology

## 2022-07-04 ENCOUNTER — Other Ambulatory Visit: Payer: Self-pay | Admitting: Hematology & Oncology

## 2022-07-04 DIAGNOSIS — C259 Malignant neoplasm of pancreas, unspecified: Secondary | ICD-10-CM

## 2022-07-09 ENCOUNTER — Inpatient Hospital Stay: Payer: 59 | Attending: Hematology & Oncology

## 2022-07-09 ENCOUNTER — Inpatient Hospital Stay: Payer: 59

## 2022-07-09 ENCOUNTER — Inpatient Hospital Stay (HOSPITAL_BASED_OUTPATIENT_CLINIC_OR_DEPARTMENT_OTHER): Payer: 59 | Admitting: Hematology & Oncology

## 2022-07-09 ENCOUNTER — Encounter: Payer: Self-pay | Admitting: Hematology & Oncology

## 2022-07-09 ENCOUNTER — Other Ambulatory Visit: Payer: Self-pay

## 2022-07-09 VITALS — BP 146/47 | HR 66 | Resp 18

## 2022-07-09 DIAGNOSIS — C259 Malignant neoplasm of pancreas, unspecified: Secondary | ICD-10-CM

## 2022-07-09 DIAGNOSIS — C253 Malignant neoplasm of pancreatic duct: Secondary | ICD-10-CM | POA: Diagnosis not present

## 2022-07-09 DIAGNOSIS — C787 Secondary malignant neoplasm of liver and intrahepatic bile duct: Secondary | ICD-10-CM

## 2022-07-09 DIAGNOSIS — Z79620 Long term (current) use of immunosuppressive biologic: Secondary | ICD-10-CM | POA: Insufficient documentation

## 2022-07-09 DIAGNOSIS — Z5112 Encounter for antineoplastic immunotherapy: Secondary | ICD-10-CM | POA: Diagnosis not present

## 2022-07-09 LAB — CBC WITH DIFFERENTIAL (CANCER CENTER ONLY)
Abs Immature Granulocytes: 0.01 10*3/uL (ref 0.00–0.07)
Basophils Absolute: 0 10*3/uL (ref 0.0–0.1)
Basophils Relative: 1 %
Eosinophils Absolute: 0.1 10*3/uL (ref 0.0–0.5)
Eosinophils Relative: 3 %
HCT: 31.5 % — ABNORMAL LOW (ref 36.0–46.0)
Hemoglobin: 10.3 g/dL — ABNORMAL LOW (ref 12.0–15.0)
Immature Granulocytes: 0 %
Lymphocytes Relative: 36 %
Lymphs Abs: 1.5 10*3/uL (ref 0.7–4.0)
MCH: 32 pg (ref 26.0–34.0)
MCHC: 32.7 g/dL (ref 30.0–36.0)
MCV: 97.8 fL (ref 80.0–100.0)
Monocytes Absolute: 0.4 10*3/uL (ref 0.1–1.0)
Monocytes Relative: 10 %
Neutro Abs: 2.1 10*3/uL (ref 1.7–7.7)
Neutrophils Relative %: 50 %
Platelet Count: 203 10*3/uL (ref 150–400)
RBC: 3.22 MIL/uL — ABNORMAL LOW (ref 3.87–5.11)
RDW: 13.1 % (ref 11.5–15.5)
WBC Count: 4.1 10*3/uL (ref 4.0–10.5)
nRBC: 0 % (ref 0.0–0.2)

## 2022-07-09 LAB — TSH: TSH: 1.444 u[IU]/mL (ref 0.350–4.500)

## 2022-07-09 LAB — CMP (CANCER CENTER ONLY)
ALT: 12 U/L (ref 0–44)
AST: 19 U/L (ref 15–41)
Albumin: 3.9 g/dL (ref 3.5–5.0)
Alkaline Phosphatase: 48 U/L (ref 38–126)
Anion gap: 9 (ref 5–15)
BUN: 34 mg/dL — ABNORMAL HIGH (ref 8–23)
CO2: 26 mmol/L (ref 22–32)
Calcium: 9.6 mg/dL (ref 8.9–10.3)
Chloride: 102 mmol/L (ref 98–111)
Creatinine: 0.94 mg/dL (ref 0.44–1.00)
GFR, Estimated: 57 mL/min — ABNORMAL LOW (ref >60)
Glucose, Bld: 110 mg/dL — ABNORMAL HIGH (ref 70–99)
Potassium: 4.4 mmol/L (ref 3.5–5.1)
Sodium: 137 mmol/L (ref 135–145)
Total Bilirubin: 0.3 mg/dL (ref 0.3–1.2)
Total Protein: 6.6 g/dL (ref 6.5–8.1)

## 2022-07-09 MED ORDER — SODIUM CHLORIDE 0.9% FLUSH
10.0000 mL | Freq: Once | INTRAVENOUS | Status: AC
  Administered 2022-07-09: 10 mL

## 2022-07-09 MED ORDER — SODIUM CHLORIDE 0.9 % IV SOLN: Freq: Once | INTRAVENOUS | Status: AC

## 2022-07-09 MED ORDER — PROCHLORPERAZINE MALEATE 10 MG PO TABS
10.0000 mg | ORAL_TABLET | Freq: Once | ORAL | Status: AC
  Administered 2022-07-09: 10 mg via ORAL
  Filled 2022-07-09: qty 1

## 2022-07-09 MED ORDER — HEPARIN SOD (PORK) LOCK FLUSH 100 UNIT/ML IV SOLN
500.0000 [IU] | Freq: Once | INTRAVENOUS | Status: AC | PRN
  Administered 2022-07-09: 500 [IU]

## 2022-07-09 MED ORDER — SODIUM CHLORIDE 0.9 % IV SOLN
1500.0000 mg | Freq: Once | INTRAVENOUS | Status: AC
  Administered 2022-07-09: 1500 mg via INTRAVENOUS
  Filled 2022-07-09: qty 30

## 2022-07-09 MED ORDER — SODIUM CHLORIDE 0.9 % IV SOLN
800.0000 mg/m2 | Freq: Once | INTRAVENOUS | Status: AC
  Administered 2022-07-09: 1254 mg via INTRAVENOUS
  Filled 2022-07-09: qty 25.98

## 2022-07-09 MED ORDER — SODIUM CHLORIDE 0.9% FLUSH
10.0000 mL | INTRAVENOUS | Status: DC | PRN
  Administered 2022-07-09: 10 mL

## 2022-07-09 NOTE — Patient Instructions (Signed)

## 2022-07-09 NOTE — Progress Notes (Signed)
Hematology and Oncology Follow Up Visit  Angelica Benson [id number] [birth year] 89 y.o. 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  She actually looks pretty good.  She is lost a little bit of weight.  She says she is eating a little bit better.  We did go ahead and do a MRI on the liver.  This was done on 06/19/2022.  Everything looked pretty stable on the MRI.  I am glad that she is doing better.  She really has done nicely with treatment.  I am very happy with how well she has responded.  Of note, her last CA 19-9 was 55.  She has had no nausea or vomiting.  She has had no bleeding.  There is no change in bowel or bladder habits.  She has had a little bit of leg swelling but this is chronic.  Overall, I would say that her performance status is probably ECOG 2.     Wt Readings from Last 3 Encounters:  07/09/22 127 lb (57.6 kg)  06/07/22 130 lb (59 kg)  05/06/22 124 lb (56.2 kg)      Medications:  Allergies as of 07/09/2022       Reactions   Buprenorphine Hcl Rash   Morphine And Related Rash        Medication List        Accurate as of July 09, 2022 12:49 PM. If you have any questions, ask your nurse or doctor.          Accu-Chek FastClix Lancets Misc Check blood sugar once to 2 times a day   Accu-Chek Guide test strip Generic drug: glucose blood Check blood sugars 1-2 times daily   acetaminophen 500 MG tablet Commonly known as: TYLENOL Take 2 tablets (1,000 mg total) by mouth 2 (two) times daily as needed for moderate pain.   albuterol 108 (90 Base) MCG/ACT inhaler Commonly known as: VENTOLIN HFA USE 2 INHALATIONS BY MOUTH EVERY 6 HOURS AS NEEDED FOR WHEEZING  OR SHORTNESS OF BREATH   aspirin EC 81  MG tablet Take 81 mg by mouth daily.   atorvastatin 10 MG tablet Commonly known as: LIPITOR TAKE 1 TABLET BY MOUTH DAILY   betamethasone dipropionate 0.05 % cream APPLY TOPICALLY TO AFFECTED  AREA(S) TWICE DAILY   Centrum Silver 50+Women Tabs Take 1 tablet by mouth daily.   escitalopram 5 MG tablet Commonly known as: LEXAPRO TAKE 1 TABLET BY MOUTH DAILY   fluticasone 50 MCG/ACT nasal spray Commonly known as: FLONASE USE 2 SPRAYS IN BOTH NOSTRILS  DAILY   lisinopril 10 MG tablet Commonly known as: ZESTRIL TAKE 1 TABLET BY MOUTH DAILY   metFORMIN 500 MG tablet Commonly known as: GLUCOPHAGE TAKE 1 TABLET BY MOUTH TWICE  DAILY WITH A MEAL   mometasone-formoterol 200-5 MCG/ACT Aero Commonly known as: DULERA Inhale 1 puff into the lungs 2 (two) times daily.   nitroGLYCERIN 0.4 MG SL tablet Commonly known as: NITROSTAT DISSOLVE 1 TABLET UNDER THE TONGUE EVERY 5 MINUTES AS  NEEDED FOR CHEST PAIN. MAX  OF 3 TABLETS IN 15 MINUTES. CALL 911 IF PAIN PERSISTS.   omeprazole 20 MG capsule Commonly known as: PRILOSEC TAKE 1 CAPSULE BY MOUTH DAILY  Allergies:  Allergies  Allergen Reactions   Buprenorphine Hcl Rash   Morphine And Related Rash    Past Medical History, Surgical history, Social history, and Family History were reviewed and updated.  Review of Systems: Review of Systems  Constitutional: Negative.   HENT: Negative.    Eyes: Negative.   Respiratory: Negative.    Cardiovascular: Negative.   Gastrointestinal: Negative.   Genitourinary: Negative.   Musculoskeletal: Negative.   Skin: Negative.   Neurological: Negative.   Endo/Heme/Allergies: Negative.   Psychiatric/Behavioral: Negative.        Physical Exam:  height is 5\' 1"  (1.549 m) and weight is 127 lb (57.6 kg). Her oral temperature is 97.7 F (36.5 C). Her blood pressure is 145/53 (abnormal) and her pulse is 70. Her respiration is 18 and oxygen saturation is 100%.   Wt Readings from Last 3  Encounters:  07/09/22 127 lb (57.6 kg)  06/07/22 130 lb (59 kg)  05/06/22 124 lb (56.2 kg)   Physical Exam Vitals reviewed.  HENT:     Head: Normocephalic and atraumatic.  Eyes:     Pupils: Pupils are equal, round, and reactive to light.  Cardiovascular:     Rate and Rhythm: Normal rate and regular rhythm.     Heart sounds: Normal heart sounds.  Pulmonary:     Effort: Pulmonary effort is normal.     Breath sounds: Normal breath sounds.  Abdominal:     General: Bowel sounds are normal.     Palpations: Abdomen is soft.  Musculoskeletal:        General: No tenderness or deformity. Normal range of motion.     Cervical back: Normal range of motion.  Lymphadenopathy:     Cervical: No cervical adenopathy.  Skin:    General: Skin is warm and dry.     Findings: No erythema or rash.  Neurological:     Mental Status: She is alert and oriented to person, place, and time.  Psychiatric:        Behavior: Behavior normal.        Thought Content: Thought content normal.        Judgment: Judgment normal.      Lab Results  Component Value Date   WBC 3.9 (L) 06/07/2022   HGB 9.9 (L) 06/07/2022   HCT 30.9 (L) 06/07/2022   MCV 100.7 (H) 06/07/2022   PLT 291 06/07/2022   Lab Results  Component Value Date   IRON 57 10/14/2017   Lab Results  Component Value Date   RBC 3.07 (L) 06/07/2022   No results found for: "KPAFRELGTCHN", "LAMBDASER", "KAPLAMBRATIO" No results found for: "IGGSERUM", "IGA", "IGMSERUM" No results found for: "TOTALPROTELP", "ALBUMINELP", "A1GS", "A2GS", "BETS", "BETA2SER", "GAMS", "MSPIKE", "SPEI"   Chemistry      Component Value Date/Time   NA 138 06/07/2022 1118   K 4.4 06/07/2022 1118   CL 101 06/07/2022 1118   CO2 29 06/07/2022 1118   BUN 23 06/07/2022 1118   CREATININE 1.07 (H) 06/07/2022 1118   CREATININE 0.92 06/29/2021 1450      Component Value Date/Time   CALCIUM 9.6 06/07/2022 1118   ALKPHOS 53 06/07/2022 1118   AST 23 06/07/2022 1118   ALT  13 06/07/2022 1118   BILITOT 0.4 06/07/2022 1118       Impression and Plan: Angelica Benson is a very pleasant 89 yo African American female with 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   We will go ahead  and get her back on the gemcitabine with the Durvalumab.  Again I think she is able to tolerate this.  We have cut the dose back of the gemcitabine little bit.  When I see her back in a month, we will have to reassess everything.  We will certainly make allowances with respect to treatment.  Again we want to focus on her quality of life.  I am just happy that she has responded and that she has had a decent quality of life.  Of note, she does have the specific FGFR2 mutation.  As such, we might be able to use one of the oral targeted drugs.    Volanda Napoleon, MD 3/26/202412:49 PM

## 2022-07-10 LAB — T4: T4, Total: 6.4 ug/dL (ref 4.5–12.0)

## 2022-07-16 ENCOUNTER — Other Ambulatory Visit: Payer: Self-pay

## 2022-07-16 ENCOUNTER — Encounter: Payer: Self-pay | Admitting: Podiatry

## 2022-07-16 ENCOUNTER — Ambulatory Visit (INDEPENDENT_AMBULATORY_CARE_PROVIDER_SITE_OTHER): Payer: 59 | Admitting: Podiatry

## 2022-07-16 DIAGNOSIS — M79675 Pain in left toe(s): Secondary | ICD-10-CM

## 2022-07-16 DIAGNOSIS — E119 Type 2 diabetes mellitus without complications: Secondary | ICD-10-CM

## 2022-07-16 DIAGNOSIS — M79674 Pain in right toe(s): Secondary | ICD-10-CM

## 2022-07-16 DIAGNOSIS — B351 Tinea unguium: Secondary | ICD-10-CM | POA: Diagnosis not present

## 2022-07-16 NOTE — Progress Notes (Signed)
This patient returns to my office for at risk foot care.  This patient requires this care by a professional since this patient will be at risk due to having  Type 2 diabetes.  This patient is unable to cut nails herself since the patient cannot reach her nails.These nails are painful walking and wearing shoes.   Patient presents to the office with her daughter. This patient presents for at risk foot care today.  General Appearance  Alert, conversant and in no acute stress.  Vascular  Dorsalis pedis  are weakly  palpable  bilaterally.  Posterior tibial pulses are not palpable  B/L. Absent digital hair. Capillary return is within normal limits  bilaterally. Temperature is within normal limits  bilaterally.  Neurologic  Senn-Weinstein monofilament wire test diminished  bilaterally. Muscle power within normal limits bilaterally.  Nails Thick disfigured discolored nails with subungual debris  from hallux to fifth toes bilaterally. No evidence of bacterial infection or drainage bilaterally.  Orthopedic  No limitations of motion  feet .  No crepitus or effusions noted.  No bony pathology or digital deformities noted.  Skin  normotropic skin with no porokeratosis noted bilaterally.  No signs of infections or ulcers noted.     Onychomycosis  Pain in right toes  Pain in left toes  Consent was obtained for treatment procedures.   Mechanical debridement of nails 1-5  bilaterally performed with a nail nipper.  Filed with dremel without incident.    Return office visit  10 weeks                    Told patient to return for periodic foot care and evaluation due to potential at risk complications.   Zarius Furr DPM   

## 2022-07-18 ENCOUNTER — Other Ambulatory Visit: Payer: Self-pay

## 2022-07-19 ENCOUNTER — Other Ambulatory Visit: Payer: Self-pay | Admitting: Hematology & Oncology

## 2022-07-19 ENCOUNTER — Inpatient Hospital Stay: Payer: 59

## 2022-07-19 ENCOUNTER — Inpatient Hospital Stay: Payer: 59 | Attending: Hematology & Oncology

## 2022-07-19 VITALS — BP 139/56 | HR 64 | Temp 97.5°F | Resp 17 | Wt 112.4 lb

## 2022-07-19 DIAGNOSIS — Z5112 Encounter for antineoplastic immunotherapy: Secondary | ICD-10-CM | POA: Insufficient documentation

## 2022-07-19 DIAGNOSIS — C253 Malignant neoplasm of pancreatic duct: Secondary | ICD-10-CM | POA: Insufficient documentation

## 2022-07-19 DIAGNOSIS — C259 Malignant neoplasm of pancreas, unspecified: Secondary | ICD-10-CM

## 2022-07-19 DIAGNOSIS — C787 Secondary malignant neoplasm of liver and intrahepatic bile duct: Secondary | ICD-10-CM | POA: Diagnosis not present

## 2022-07-19 DIAGNOSIS — Z5111 Encounter for antineoplastic chemotherapy: Secondary | ICD-10-CM | POA: Diagnosis not present

## 2022-07-19 LAB — CMP (CANCER CENTER ONLY)
ALT: 16 U/L (ref 0–44)
AST: 24 U/L (ref 15–41)
Albumin: 3.6 g/dL (ref 3.5–5.0)
Alkaline Phosphatase: 51 U/L (ref 38–126)
Anion gap: 7 (ref 5–15)
BUN: 30 mg/dL — ABNORMAL HIGH (ref 8–23)
CO2: 25 mmol/L (ref 22–32)
Calcium: 9.2 mg/dL (ref 8.9–10.3)
Chloride: 101 mmol/L (ref 98–111)
Creatinine: 0.86 mg/dL (ref 0.44–1.00)
GFR, Estimated: >60 mL/min (ref >60)
Glucose, Bld: 96 mg/dL (ref 70–99)
Potassium: 4.1 mmol/L (ref 3.5–5.1)
Sodium: 133 mmol/L — ABNORMAL LOW (ref 135–145)
Total Bilirubin: 0.2 mg/dL — ABNORMAL LOW (ref 0.3–1.2)
Total Protein: 6.9 g/dL (ref 6.5–8.1)

## 2022-07-19 LAB — CBC WITH DIFFERENTIAL (CANCER CENTER ONLY)
Abs Immature Granulocytes: 0.01 10*3/uL (ref 0.00–0.07)
Basophils Absolute: 0 10*3/uL (ref 0.0–0.1)
Basophils Relative: 1 %
Eosinophils Absolute: 0.1 10*3/uL (ref 0.0–0.5)
Eosinophils Relative: 1 %
HCT: 31.4 % — ABNORMAL LOW (ref 36.0–46.0)
Hemoglobin: 10.4 g/dL — ABNORMAL LOW (ref 12.0–15.0)
Immature Granulocytes: 0 %
Lymphocytes Relative: 39 %
Lymphs Abs: 1.4 10*3/uL (ref 0.7–4.0)
MCH: 31.9 pg (ref 26.0–34.0)
MCHC: 33.1 g/dL (ref 30.0–36.0)
MCV: 96.3 fL (ref 80.0–100.0)
Monocytes Absolute: 0.4 10*3/uL (ref 0.1–1.0)
Monocytes Relative: 10 %
Neutro Abs: 1.7 10*3/uL (ref 1.7–7.7)
Neutrophils Relative %: 49 %
Platelet Count: 146 10*3/uL — ABNORMAL LOW (ref 150–400)
RBC: 3.26 MIL/uL — ABNORMAL LOW (ref 3.87–5.11)
RDW: 12.9 % (ref 11.5–15.5)
WBC Count: 3.5 10*3/uL — ABNORMAL LOW (ref 4.0–10.5)
nRBC: 0 % (ref 0.0–0.2)

## 2022-07-19 MED ORDER — PROCHLORPERAZINE MALEATE 10 MG PO TABS
10.0000 mg | ORAL_TABLET | Freq: Once | ORAL | Status: AC
  Administered 2022-07-19: 10 mg via ORAL
  Filled 2022-07-19: qty 1

## 2022-07-19 MED ORDER — MEGESTROL ACETATE 400 MG/10ML PO SUSP: 400.0000 mg | Freq: Two times a day (BID) | ORAL | 4 refills | Status: DC

## 2022-07-19 MED ORDER — SODIUM CHLORIDE 0.9 % IV SOLN
800.0000 mg/m2 | Freq: Once | INTRAVENOUS | Status: AC
  Administered 2022-07-19: 1254 mg via INTRAVENOUS
  Filled 2022-07-19: qty 25.98

## 2022-07-19 MED ORDER — HEPARIN SOD (PORK) LOCK FLUSH 100 UNIT/ML IV SOLN
500.0000 [IU] | Freq: Once | INTRAVENOUS | Status: AC | PRN
  Administered 2022-07-19: 500 [IU]

## 2022-07-19 MED ORDER — SODIUM CHLORIDE 0.9 % IV SOLN: Freq: Once | INTRAVENOUS | Status: AC

## 2022-07-19 MED ORDER — SODIUM CHLORIDE 0.9% FLUSH
10.0000 mL | INTRAVENOUS | Status: DC | PRN
  Administered 2022-07-19: 10 mL

## 2022-07-19 NOTE — Patient Instructions (Signed)

## 2022-07-26 ENCOUNTER — Other Ambulatory Visit: Payer: Self-pay | Admitting: Hematology and Oncology

## 2022-08-09 ENCOUNTER — Inpatient Hospital Stay: Payer: 59

## 2022-08-09 ENCOUNTER — Encounter: Payer: Self-pay | Admitting: Hematology & Oncology

## 2022-08-09 ENCOUNTER — Inpatient Hospital Stay (HOSPITAL_BASED_OUTPATIENT_CLINIC_OR_DEPARTMENT_OTHER): Payer: 59 | Admitting: Hematology & Oncology

## 2022-08-09 ENCOUNTER — Telehealth: Payer: Self-pay | Admitting: Family

## 2022-08-09 VITALS — BP 140/57 | HR 76 | Resp 18

## 2022-08-09 VITALS — BP 142/53 | HR 83 | Temp 97.5°F | Resp 20 | Ht 61.0 in | Wt 143.0 lb

## 2022-08-09 DIAGNOSIS — C259 Malignant neoplasm of pancreas, unspecified: Secondary | ICD-10-CM | POA: Diagnosis not present

## 2022-08-09 DIAGNOSIS — Z5111 Encounter for antineoplastic chemotherapy: Secondary | ICD-10-CM | POA: Diagnosis not present

## 2022-08-09 DIAGNOSIS — C787 Secondary malignant neoplasm of liver and intrahepatic bile duct: Secondary | ICD-10-CM

## 2022-08-09 DIAGNOSIS — Z5112 Encounter for antineoplastic immunotherapy: Secondary | ICD-10-CM | POA: Diagnosis not present

## 2022-08-09 DIAGNOSIS — C253 Malignant neoplasm of pancreatic duct: Secondary | ICD-10-CM | POA: Diagnosis not present

## 2022-08-09 LAB — SAMPLE TO BLOOD BANK

## 2022-08-09 LAB — CMP (CANCER CENTER ONLY)
ALT: 13 U/L (ref 0–44)
AST: 16 U/L (ref 15–41)
Albumin: 3.6 g/dL (ref 3.5–5.0)
Alkaline Phosphatase: 40 U/L (ref 38–126)
Anion gap: 5 (ref 5–15)
BUN: 51 mg/dL — ABNORMAL HIGH (ref 8–23)
CO2: 18 mmol/L — ABNORMAL LOW (ref 22–32)
Calcium: 9.2 mg/dL (ref 8.9–10.3)
Chloride: 107 mmol/L (ref 98–111)
Creatinine: 1.11 mg/dL — ABNORMAL HIGH (ref 0.44–1.00)
GFR, Estimated: 47 mL/min — ABNORMAL LOW (ref >60)
Glucose, Bld: 168 mg/dL — ABNORMAL HIGH (ref 70–99)
Potassium: 5 mmol/L (ref 3.5–5.1)
Sodium: 130 mmol/L — ABNORMAL LOW (ref 135–145)
Total Bilirubin: 0.3 mg/dL (ref 0.3–1.2)
Total Protein: 6.2 g/dL — ABNORMAL LOW (ref 6.5–8.1)

## 2022-08-09 LAB — CBC WITH DIFFERENTIAL (CANCER CENTER ONLY)
Abs Immature Granulocytes: 0.04 10*3/uL (ref 0.00–0.07)
Basophils Absolute: 0 10*3/uL (ref 0.0–0.1)
Basophils Relative: 0 %
Eosinophils Absolute: 0.1 10*3/uL (ref 0.0–0.5)
Eosinophils Relative: 1 %
HCT: 29.1 % — ABNORMAL LOW (ref 36.0–46.0)
Hemoglobin: 9.7 g/dL — ABNORMAL LOW (ref 12.0–15.0)
Immature Granulocytes: 1 %
Lymphocytes Relative: 26 %
Lymphs Abs: 2 10*3/uL (ref 0.7–4.0)
MCH: 31.8 pg (ref 26.0–34.0)
MCHC: 33.3 g/dL (ref 30.0–36.0)
MCV: 95.4 fL (ref 80.0–100.0)
Monocytes Absolute: 0.7 10*3/uL (ref 0.1–1.0)
Monocytes Relative: 10 %
Neutro Abs: 4.6 10*3/uL (ref 1.7–7.7)
Neutrophils Relative %: 62 %
Platelet Count: 426 10*3/uL — ABNORMAL HIGH (ref 150–400)
RBC: 3.05 MIL/uL — ABNORMAL LOW (ref 3.87–5.11)
RDW: 14.6 % (ref 11.5–15.5)
WBC Count: 7.5 10*3/uL (ref 4.0–10.5)
nRBC: 0 % (ref 0.0–0.2)

## 2022-08-09 LAB — PREALBUMIN: Prealbumin: 28 mg/dL (ref 18–38)

## 2022-08-09 MED ORDER — SODIUM CHLORIDE 0.9% FLUSH
10.0000 mL | INTRAVENOUS | Status: DC | PRN
  Administered 2022-08-09: 10 mL

## 2022-08-09 MED ORDER — FAMOTIDINE IN NACL 20-0.9 MG/50ML-% IV SOLN: 20.0000 mg | Freq: Once | INTRAVENOUS | Status: DC | PRN

## 2022-08-09 MED ORDER — SODIUM CHLORIDE 0.9 % IV SOLN: Freq: Once | INTRAVENOUS | Status: DC | PRN

## 2022-08-09 MED ORDER — SODIUM CHLORIDE 0.9 % IV SOLN
800.0000 mg/m2 | Freq: Once | INTRAVENOUS | Status: AC
  Administered 2022-08-09: 1254 mg via INTRAVENOUS
  Filled 2022-08-09: qty 27.68

## 2022-08-09 MED ORDER — METHYLPREDNISOLONE SODIUM SUCC 125 MG IJ SOLR: 125.0000 mg | Freq: Once | INTRAMUSCULAR | Status: DC | PRN

## 2022-08-09 MED ORDER — DIPHENHYDRAMINE HCL 50 MG/ML IJ SOLN: 50.0000 mg | Freq: Once | INTRAMUSCULAR | Status: DC | PRN

## 2022-08-09 MED ORDER — ALTEPLASE 2 MG IJ SOLR: 2.0000 mg | Freq: Once | INTRAMUSCULAR | Status: DC | PRN

## 2022-08-09 MED ORDER — SODIUM CHLORIDE 0.9 % IV SOLN: Freq: Once | INTRAVENOUS | Status: AC

## 2022-08-09 MED ORDER — ALBUTEROL SULFATE HFA 108 (90 BASE) MCG/ACT IN AERS: 2.0000 | INHALATION_SPRAY | Freq: Once | RESPIRATORY_TRACT | Status: DC | PRN

## 2022-08-09 MED ORDER — EPINEPHRINE 0.3 MG/0.3ML IJ SOAJ: 0.3000 mg | Freq: Once | INTRAMUSCULAR | Status: DC | PRN

## 2022-08-09 MED ORDER — SODIUM CHLORIDE 0.9% FLUSH: 3.0000 mL | INTRAVENOUS | Status: DC | PRN

## 2022-08-09 MED ORDER — PROCHLORPERAZINE MALEATE 10 MG PO TABS
10.0000 mg | ORAL_TABLET | Freq: Once | ORAL | Status: AC
  Administered 2022-08-09: 10 mg via ORAL
  Filled 2022-08-09: qty 1

## 2022-08-09 MED ORDER — SODIUM CHLORIDE 0.9 % IV SOLN
1500.0000 mg | Freq: Once | INTRAVENOUS | Status: AC
  Administered 2022-08-09: 1500 mg via INTRAVENOUS
  Filled 2022-08-09: qty 30

## 2022-08-09 MED ORDER — HEPARIN SOD (PORK) LOCK FLUSH 100 UNIT/ML IV SOLN
500.0000 [IU] | Freq: Once | INTRAVENOUS | Status: AC | PRN
  Administered 2022-08-09: 500 [IU]

## 2022-08-09 MED ORDER — HEPARIN SOD (PORK) LOCK FLUSH 100 UNIT/ML IV SOLN: 250.0000 [IU] | Freq: Once | INTRAVENOUS | Status: DC | PRN

## 2022-08-09 NOTE — Patient Instructions (Signed)
[facility]  Discharge Instructions: Thank you for choosing [facility] to provide your oncology and hematology care.   If you have a lab appointment with the Cancer Center, please go directly to the Cancer Center and check in at the registration area.  Wear comfortable clothing and clothing appropriate for easy access to any Portacath or PICC line.   We strive to give you quality time with your provider. You may need to reschedule your appointment if you arrive late (15 or more minutes).  Arriving late affects you and other patients whose appointments are after yours.  Also, if you miss three or more appointments without notifying the office, you may be dismissed from the clinic at the provider's discretion.      For prescription refill requests, have your pharmacy contact our office and allow 72 hours for refills to be completed.    Today you received the following chemotherapy and/or immunotherapy agents Gemzar, Imfinzi.      To help prevent nausea and vomiting after your treatment, we encourage you to take your nausea medication as directed.  BELOW ARE SYMPTOMS THAT SHOULD BE REPORTED IMMEDIATELY: *FEVER GREATER THAN 100.4 F (38 C) OR HIGHER *CHILLS OR SWEATING *NAUSEA AND VOMITING THAT IS NOT CONTROLLED WITH YOUR NAUSEA MEDICATION *UNUSUAL SHORTNESS OF BREATH *UNUSUAL BRUISING OR BLEEDING *URINARY PROBLEMS (pain or burning when urinating, or frequent urination) *BOWEL PROBLEMS (unusual diarrhea, constipation, pain near the anus) TENDERNESS IN MOUTH AND THROAT WITH OR WITHOUT PRESENCE OF ULCERS (sore throat, sores in mouth, or a toothache) UNUSUAL RASH, SWELLING OR PAIN  UNUSUAL VAGINAL DISCHARGE OR ITCHING   Items with * indicate a potential emergency and should be followed up as soon as possible or go to the Emergency Department if any problems should occur.  Please show the CHEMOTHERAPY ALERT CARD or IMMUNOTHERAPY ALERT CARD at  check-in to the Emergency Department and triage nurse. Should you have questions after your visit or need to cancel or reschedule your appointment, please contact [facility]  [phone] and follow the prompts.  Office hours are 8:00 a.m. to 4:30 p.m. Monday - Friday. Please note that voicemails left after 4:00 p.m. may not be returned until the following business day.  We are closed weekends and major holidays. You have access to a nurse at all times for urgent questions. Please call the main number to the clinic [phone] and follow the prompts.  For any non-urgent questions, you may also contact your provider using MyChart. We now offer e-Visits for anyone 17 and older to request care online for non-urgent symptoms. For details visit [email or web address].   Also download the MyChart app! Go to the app store, search "MyChart", open the app, select , and log in with your MyChart username and password.

## 2022-08-09 NOTE — Telephone Encounter (Signed)
Paperwork in red folder  ?

## 2022-08-09 NOTE — Telephone Encounter (Signed)
Pt's daughter dropped off paperwork to be filled out. Placed in pcp's folder up front. She stated we can just fax it to the number provided on the paperwork.

## 2022-08-09 NOTE — Patient Instructions (Signed)

## 2022-08-09 NOTE — Progress Notes (Signed)
Hematology and Oncology Follow Up Visit  LIN Benson [id number] 01-Jul-1928 89 y.o. 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 Angelica Benson is here today with her daughter for follow-up.  She is actually managing pretty well.  She has gained weight.  I am I am very happy about this.  She is eating better.  I think we put her on little prednisone to try to get her to eat a little bit better.  She has had no problems with abdominal pain.  There is no bleeding.  There is no change in bowel or bladder habits.  When we last checked her tumor marker it was stable at 55.  She has had no cough or shortness of breath.  There is been no bleeding.  She has had little bit of leg swelling but this is more chronic.  Overall, I would say that her performance status is probably ECOG 2.    Wt Readings from Last 3 Encounters:  08/09/22 143 lb (64.9 kg)  07/19/22 112 lb 6.4 oz (51 kg)  07/09/22 127 lb (57.6 kg)      Medications:  Allergies as of 08/09/2022       Reactions   Buprenorphine Hcl Rash   Morphine And Related Rash        Medication List        Accurate as of August 09, 2022  1:32 PM. If you have any questions, ask your nurse or doctor.          Accu-Chek FastClix Lancets Misc Check blood sugar once to 2 times a day What changed: additional instructions   Accu-Chek Guide test strip Generic drug: glucose blood Check blood sugars 1-2 times daily   acetaminophen 500 MG tablet Commonly known as: TYLENOL Take 2 tablets (1,000 mg total) by mouth 2 (two) times daily as needed for moderate pain.   albuterol 108 (90 Base) MCG/ACT inhaler Commonly known as: VENTOLIN HFA USE 2 INHALATIONS BY MOUTH EVERY 6 HOURS AS NEEDED FOR WHEEZING  OR SHORTNESS OF BREATH   aspirin EC 81 MG  tablet Take 81 mg by mouth daily.   atorvastatin 10 MG tablet Commonly known as: LIPITOR TAKE 1 TABLET BY MOUTH DAILY   betamethasone dipropionate 0.05 % cream APPLY TOPICALLY TO AFFECTED  AREA(S) TWICE DAILY   Centrum Silver 50+Women Tabs Take 1 tablet by mouth daily.   escitalopram 5 MG tablet Commonly known as: LEXAPRO TAKE 1 TABLET BY MOUTH DAILY   fluticasone 50 MCG/ACT nasal spray Commonly known as: FLONASE USE 2 SPRAYS IN BOTH NOSTRILS  DAILY   lisinopril 10 MG tablet Commonly known as: ZESTRIL TAKE 1 TABLET BY MOUTH DAILY   megestrol 400 MG/10ML suspension Commonly known as: MEGACE Take 10 mLs (400 mg total) by mouth 2 (two) times daily. What changed: when to take this   metFORMIN 500 MG tablet Commonly known as: GLUCOPHAGE TAKE 1 TABLET BY MOUTH TWICE  DAILY WITH A MEAL   mometasone-formoterol 200-5 MCG/ACT Aero Commonly known as: DULERA Inhale 1 puff into the lungs 2 (two) times daily.   nitroGLYCERIN 0.4 MG SL tablet Commonly known as: NITROSTAT DISSOLVE 1 TABLET UNDER THE TONGUE EVERY 5 MINUTES AS  NEEDED FOR CHEST PAIN. MAX  OF 3 TABLETS IN 15 MINUTES. CALL 911 IF PAIN PERSISTS.  omeprazole 20 MG capsule Commonly known as: PRILOSEC TAKE 1 CAPSULE BY MOUTH DAILY        Allergies:  Allergies  Allergen Reactions   Buprenorphine Hcl Rash   Morphine And Related Rash    Past Medical History, Surgical history, Social history, and Family History were reviewed and updated.  Review of Systems: Review of Systems  Constitutional: Negative.   HENT: Negative.    Eyes: Negative.   Respiratory: Negative.    Cardiovascular: Negative.   Gastrointestinal: Negative.   Genitourinary: Negative.   Musculoskeletal: Negative.   Skin: Negative.   Neurological: Negative.   Endo/Heme/Allergies: Negative.   Psychiatric/Behavioral: Negative.        Physical Exam:  height is 5\' 1"  (1.549 m) and weight is 143 lb (64.9 kg). Her oral temperature is 97.5 F  (36.4 C) (abnormal). Her blood pressure is 142/53 (abnormal) and her pulse is 83. Her respiration is 20 and oxygen saturation is 100%.   Wt Readings from Last 3 Encounters:  08/09/22 143 lb (64.9 kg)  07/19/22 112 lb 6.4 oz (51 kg)  07/09/22 127 lb (57.6 kg)   Physical Exam Vitals reviewed.  HENT:     Head: Normocephalic and atraumatic.  Eyes:     Pupils: Pupils are equal, round, and reactive to light.  Cardiovascular:     Rate and Rhythm: Normal rate and regular rhythm.     Heart sounds: Normal heart sounds.  Pulmonary:     Effort: Pulmonary effort is normal.     Breath sounds: Normal breath sounds.  Abdominal:     General: Bowel sounds are normal.     Palpations: Abdomen is soft.  Musculoskeletal:        General: No tenderness or deformity. Normal range of motion.     Cervical back: Normal range of motion.  Lymphadenopathy:     Cervical: No cervical adenopathy.  Skin:    General: Skin is warm and dry.     Findings: No erythema or rash.  Neurological:     Mental Status: She is alert and oriented to person, place, and time.  Psychiatric:        Behavior: Behavior normal.        Thought Content: Thought content normal.        Judgment: Judgment normal.     Lab Results  Component Value Date   WBC 7.5 08/09/2022   HGB 9.7 (L) 08/09/2022   HCT 29.1 (L) 08/09/2022   MCV 95.4 08/09/2022   PLT 426 (H) 08/09/2022   Lab Results  Component Value Date   IRON 57 10/14/2017   Lab Results  Component Value Date   RBC 3.05 (L) 08/09/2022   No results found for: "KPAFRELGTCHN", "LAMBDASER", "KAPLAMBRATIO" No results found for: "IGGSERUM", "IGA", "IGMSERUM" No results found for: "TOTALPROTELP", "ALBUMINELP", "A1GS", "A2GS", "BETS", "BETA2SER", "GAMS", "MSPIKE", "SPEI"   Chemistry      Component Value Date/Time   NA 133 (L) 07/19/2022 1245   K 4.1 07/19/2022 1245   CL 101 07/19/2022 1245   CO2 25 07/19/2022 1245   BUN 30 (H) 07/19/2022 1245   CREATININE 0.86 07/19/2022  1245   CREATININE 0.92 06/29/2021 1450      Component Value Date/Time   CALCIUM 9.2 07/19/2022 1245   ALKPHOS 51 07/19/2022 1245   AST 24 07/19/2022 1245   ALT 16 07/19/2022 1245   BILITOT 0.2 (L) 07/19/2022 1245       Impression and Plan: Ms. 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   We will go ahead and get her back on the gemcitabine with the Durvalumab.  This will be her 9th cycle of treatment.  I am just very pleased and happy that she has done so well.  Her quality of life is again doing pretty well.  She does get around with a rolling walker.  Again, we will continue with her therapy.  We will plan to get her back to see [place] in another 4 weeks.    Josph Macho, MD 4/26/20241:32 PM

## 2022-08-11 LAB — CANCER ANTIGEN 19-9: CA 19-9: 56 U/mL — ABNORMAL HIGH (ref 0–35)

## 2022-09-06 ENCOUNTER — Encounter: Payer: Self-pay | Admitting: Hematology & Oncology

## 2022-09-06 ENCOUNTER — Inpatient Hospital Stay: Payer: 59

## 2022-09-06 ENCOUNTER — Inpatient Hospital Stay (HOSPITAL_BASED_OUTPATIENT_CLINIC_OR_DEPARTMENT_OTHER): Payer: 59 | Admitting: Hematology & Oncology

## 2022-09-06 ENCOUNTER — Inpatient Hospital Stay: Payer: 59 | Attending: Hematology & Oncology

## 2022-09-06 ENCOUNTER — Other Ambulatory Visit: Payer: Self-pay

## 2022-09-06 VITALS — BP 128/45 | HR 74 | Temp 97.8°F | Ht 61.0 in | Wt 132.0 lb

## 2022-09-06 DIAGNOSIS — C787 Secondary malignant neoplasm of liver and intrahepatic bile duct: Secondary | ICD-10-CM | POA: Insufficient documentation

## 2022-09-06 DIAGNOSIS — C259 Malignant neoplasm of pancreas, unspecified: Secondary | ICD-10-CM

## 2022-09-06 DIAGNOSIS — Z5111 Encounter for antineoplastic chemotherapy: Secondary | ICD-10-CM | POA: Insufficient documentation

## 2022-09-06 DIAGNOSIS — Z5112 Encounter for antineoplastic immunotherapy: Secondary | ICD-10-CM | POA: Insufficient documentation

## 2022-09-06 DIAGNOSIS — C253 Malignant neoplasm of pancreatic duct: Secondary | ICD-10-CM | POA: Insufficient documentation

## 2022-09-06 LAB — CMP (CANCER CENTER ONLY)
ALT: 35 U/L (ref 0–44)
AST: 33 U/L (ref 15–41)
Albumin: 3.9 g/dL (ref 3.5–5.0)
Alkaline Phosphatase: 48 U/L (ref 38–126)
Anion gap: 9 (ref 5–15)
BUN: 45 mg/dL — ABNORMAL HIGH (ref 8–23)
CO2: 22 mmol/L (ref 22–32)
Calcium: 9.4 mg/dL (ref 8.9–10.3)
Chloride: 105 mmol/L (ref 98–111)
Creatinine: 1.25 mg/dL — ABNORMAL HIGH (ref 0.44–1.00)
GFR, Estimated: 40 mL/min — ABNORMAL LOW (ref >60)
Glucose, Bld: 185 mg/dL — ABNORMAL HIGH (ref 70–99)
Potassium: 4.3 mmol/L (ref 3.5–5.1)
Sodium: 136 mmol/L (ref 135–145)
Total Bilirubin: 0.4 mg/dL (ref 0.3–1.2)
Total Protein: 6.4 g/dL — ABNORMAL LOW (ref 6.5–8.1)

## 2022-09-06 LAB — CBC WITH DIFFERENTIAL (CANCER CENTER ONLY)
Abs Immature Granulocytes: 0.03 10*3/uL (ref 0.00–0.07)
Basophils Absolute: 0 10*3/uL (ref 0.0–0.1)
Basophils Relative: 1 %
Eosinophils Absolute: 0 10*3/uL (ref 0.0–0.5)
Eosinophils Relative: 1 %
HCT: 31.1 % — ABNORMAL LOW (ref 36.0–46.0)
Hemoglobin: 10.3 g/dL — ABNORMAL LOW (ref 12.0–15.0)
Immature Granulocytes: 1 %
Lymphocytes Relative: 38 %
Lymphs Abs: 0.8 10*3/uL (ref 0.7–4.0)
MCH: 31.7 pg (ref 26.0–34.0)
MCHC: 33.1 g/dL (ref 30.0–36.0)
MCV: 95.7 fL (ref 80.0–100.0)
Monocytes Absolute: 0.2 10*3/uL (ref 0.1–1.0)
Monocytes Relative: 7 %
Neutro Abs: 1.1 10*3/uL — ABNORMAL LOW (ref 1.7–7.7)
Neutrophils Relative %: 52 %
Platelet Count: 139 10*3/uL — ABNORMAL LOW (ref 150–400)
RBC: 3.25 MIL/uL — ABNORMAL LOW (ref 3.87–5.11)
RDW: 15.9 % — ABNORMAL HIGH (ref 11.5–15.5)
Smear Review: NORMAL
WBC Count: 2.2 10*3/uL — ABNORMAL LOW (ref 4.0–10.5)
nRBC: 0 % (ref 0.0–0.2)

## 2022-09-06 LAB — SAMPLE TO BLOOD BANK

## 2022-09-06 LAB — FERRITIN: Ferritin: 163 ng/mL (ref 11–307)

## 2022-09-06 LAB — IRON AND IRON BINDING CAPACITY (CC-WL,HP ONLY)
Iron: 27 ug/dL — ABNORMAL LOW (ref 28–170)
Saturation Ratios: 9 % — ABNORMAL LOW (ref 10.4–31.8)
TIBC: 314 ug/dL (ref 250–450)
UIBC: 287 ug/dL (ref 148–442)

## 2022-09-06 LAB — LACTATE DEHYDROGENASE: LDH: 212 U/L — ABNORMAL HIGH (ref 98–192)

## 2022-09-06 MED ORDER — SODIUM CHLORIDE 0.9% FLUSH
10.0000 mL | INTRAVENOUS | Status: DC | PRN
  Administered 2022-09-06: 10 mL

## 2022-09-06 MED ORDER — HEPARIN SOD (PORK) LOCK FLUSH 100 UNIT/ML IV SOLN
500.0000 [IU] | Freq: Once | INTRAVENOUS | Status: AC | PRN
  Administered 2022-09-06: 500 [IU]

## 2022-09-06 MED ORDER — COLD PACK MISC ONCOLOGY: 1.0000 | Freq: Once | Status: DC | PRN

## 2022-09-06 MED ORDER — SODIUM CHLORIDE 0.9 % IV SOLN: Freq: Once | INTRAVENOUS | Status: AC

## 2022-09-06 MED ORDER — SODIUM CHLORIDE 0.9 % IV SOLN: Freq: Once | INTRAVENOUS | Status: DC

## 2022-09-06 MED ORDER — SODIUM CHLORIDE 0.9 % IV SOLN
1500.0000 mg | Freq: Once | INTRAVENOUS | Status: AC
  Administered 2022-09-06: 1500 mg via INTRAVENOUS
  Filled 2022-09-06: qty 30

## 2022-09-06 MED ORDER — PROCHLORPERAZINE MALEATE 10 MG PO TABS
10.0000 mg | ORAL_TABLET | Freq: Once | ORAL | Status: AC
  Administered 2022-09-06: 10 mg via ORAL
  Filled 2022-09-06: qty 1

## 2022-09-06 MED ORDER — SODIUM CHLORIDE 0.9 % IV SOLN
700.0000 mg/m2 | Freq: Once | INTRAVENOUS | Status: AC
  Administered 2022-09-06: 1102 mg via INTRAVENOUS
  Filled 2022-09-06: qty 25.98

## 2022-09-06 NOTE — Progress Notes (Signed)
Hematology and Oncology Follow Up Visit  Angelica Benson [id number] October 23, 1928 89 y.o. 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  Her memory seems to be a little bit worse today.  She just had a birthday yesterday.  She does not remember having her birthday.  She is in no pain.  She is having no problems with nausea or vomiting.  She is eating quite well.  Her daughter was wondering if we could pull back on the Megace.  There is been no problems with bleeding.  She has had no diarrhea.  There is been no leg swelling.  She has had no bleeding.  Her last CA 19-9 was axial and steady at 56.  Her last MRI was done back in March.  We probably have to set up with another 1 before she has her next cycle of treatment.  Currently, I would say that her performance status is probably ECOG 2.     Wt Readings from Last 3 Encounters:  09/06/22 132 lb (59.9 kg)  08/09/22 143 lb (64.9 kg)  07/19/22 112 lb 6.4 oz (51 kg)      Medications:  Allergies as of 09/06/2022       Reactions   Buprenorphine Hcl Rash   Morphine And Codeine Rash        Medication List        Accurate as of Sep 06, 2022  1:07 PM. If you have any questions, ask your nurse or doctor.          Accu-Chek FastClix Lancets Misc Check blood sugar once to 2 times a day What changed: additional instructions   Accu-Chek Guide test strip Generic drug: glucose blood Check blood sugars 1-2 times daily   acetaminophen 500 MG tablet Commonly known as: TYLENOL Take 2 tablets (1,000 mg total) by mouth 2 (two) times daily as needed for moderate pain.   albuterol 108 (90 Base) MCG/ACT inhaler Commonly known as: VENTOLIN HFA USE 2 INHALATIONS BY MOUTH EVERY 6  HOURS AS NEEDED FOR WHEEZING  OR SHORTNESS OF BREATH   aspirin EC 81 MG tablet Take 81 mg by mouth daily.   atorvastatin 10 MG tablet Commonly known as: LIPITOR TAKE 1 TABLET BY MOUTH DAILY   betamethasone dipropionate 0.05 % cream APPLY TOPICALLY TO AFFECTED  AREA(S) TWICE DAILY   Centrum Silver 50+Women Tabs Take 1 tablet by mouth daily.   escitalopram 5 MG tablet Commonly known as: LEXAPRO TAKE 1 TABLET BY MOUTH DAILY   fluticasone 50 MCG/ACT nasal spray Commonly known as: FLONASE USE 2 SPRAYS IN BOTH NOSTRILS  DAILY   lisinopril 10 MG tablet Commonly known as: ZESTRIL TAKE 1 TABLET BY MOUTH DAILY   megestrol 400 MG/10ML suspension Commonly known as: MEGACE Take 10 mLs (400 mg total) by mouth 2 (two) times daily. What changed: when to take this   metFORMIN 500 MG tablet Commonly known as: GLUCOPHAGE TAKE 1 TABLET BY MOUTH TWICE  DAILY WITH A MEAL   mometasone-formoterol 200-5 MCG/ACT Aero Commonly known as: DULERA Inhale 1 puff into the lungs 2 (two) times daily.   nitroGLYCERIN 0.4 MG SL tablet Commonly known as: NITROSTAT DISSOLVE 1 TABLET UNDER THE TONGUE EVERY 5 MINUTES AS  NEEDED FOR CHEST PAIN. MAX  OF 3 TABLETS IN 15 MINUTES. CALL 911 IF PAIN PERSISTS.   omeprazole 20 MG capsule Commonly known as: PRILOSEC TAKE 1 CAPSULE BY MOUTH DAILY        Allergies:  Allergies  Allergen Reactions   Buprenorphine Hcl Rash   Morphine And Codeine Rash    Past Medical History, Surgical history, Social history, and Family History were reviewed and updated.  Review of Systems: Review of Systems  Constitutional: Negative.   HENT: Negative.    Eyes: Negative.   Respiratory: Negative.    Cardiovascular: Negative.   Gastrointestinal: Negative.   Genitourinary: Negative.   Musculoskeletal: Negative.   Skin: Negative.   Neurological: Negative.   Endo/Heme/Allergies: Negative.   Psychiatric/Behavioral: Negative.        Physical Exam:  height is 5\' 1"   (1.549 m) and weight is 132 lb (59.9 kg). Her oral temperature is 97.8 F (36.6 C). Her blood pressure is 128/45 (abnormal) and her pulse is 74. Her oxygen saturation is 100%.   Wt Readings from Last 3 Encounters:  09/06/22 132 lb (59.9 kg)  08/09/22 143 lb (64.9 kg)  07/19/22 112 lb 6.4 oz (51 kg)   Physical Exam Vitals reviewed.  HENT:     Head: Normocephalic and atraumatic.  Eyes:     Pupils: Pupils are equal, round, and reactive to light.  Cardiovascular:     Rate and Rhythm: Normal rate and regular rhythm.     Heart sounds: Normal heart sounds.  Pulmonary:     Effort: Pulmonary effort is normal.     Breath sounds: Normal breath sounds.  Abdominal:     General: Bowel sounds are normal.     Palpations: Abdomen is soft.  Musculoskeletal:        General: No tenderness or deformity. Normal range of motion.     Cervical back: Normal range of motion.  Lymphadenopathy:     Cervical: No cervical adenopathy.  Skin:    General: Skin is warm and dry.     Findings: No erythema or rash.  Neurological:     Mental Status: She is alert and oriented to person, place, and time.  Psychiatric:        Behavior: Behavior normal.        Thought Content: Thought content normal.        Judgment: Judgment normal.      Lab Results  Component Value Date   WBC 2.2 (L) 09/06/2022   HGB 10.3 (L) 09/06/2022   HCT 31.1 (L) 09/06/2022   MCV 95.7 09/06/2022   PLT 139 (L) 09/06/2022   Lab Results  Component Value Date   IRON 57 10/14/2017   Lab Results  Component Value Date   RBC 3.25 (L) 09/06/2022   No results found for: "KPAFRELGTCHN", "LAMBDASER", "KAPLAMBRATIO" No results found for: "IGGSERUM", "IGA", "IGMSERUM" No results found for: "TOTALPROTELP", "ALBUMINELP", "A1GS", "A2GS", "BETS", "BETA2SER", "GAMS", "MSPIKE", "SPEI"   Chemistry      Component Value Date/Time   NA 136 09/06/2022 1205   K 4.3 09/06/2022 1205   CL 105 09/06/2022 1205   CO2 22 09/06/2022 1205   BUN 45 (H)  09/06/2022 1205   CREATININE 1.25 (H) 09/06/2022 1205   CREATININE 0.92 06/29/2021 1450      Component Value Date/Time   CALCIUM 9.4 09/06/2022 1205   ALKPHOS 48 09/06/2022 1205   AST 33 09/06/2022 1205   ALT 35 09/06/2022 1205   BILITOT 0.4 09/06/2022 1205       Impression  and Plan: Angelica Benson is a very pleasant 89 yo African American female with 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   We will go ahead and get her back on the gemcitabine with the Durvalumab.  This will be her 9th cycle of treatment.  I am just very pleased and happy that she has done so well.  I feel bad that her memory is a little bit worse.  Will get have to get her set up with a scan.  Will do the MRI in about 3 weeks.  This will really tell us how things are going.   Josph Macho, MD 5/24/20241:07 PM

## 2022-09-06 NOTE — Patient Instructions (Signed)
[facility]  Discharge Instructions: Thank you for choosing [facility] to provide your oncology and hematology care.   If you have a lab appointment with the Cancer Center, please go directly to the Cancer Center and check in at the registration area.  Wear comfortable clothing and clothing appropriate for easy access to any Portacath or PICC line.   We strive to give you quality time with your provider. You may need to reschedule your appointment if you arrive late (15 or more minutes).  Arriving late affects you and other patients whose appointments are after yours.  Also, if you miss three or more appointments without notifying the office, you may be dismissed from the clinic at the provider's discretion.      For prescription refill requests, have your pharmacy contact our office and allow 72 hours for refills to be completed.    Today you received the following chemotherapy and/or immunotherapy agents Gemzar   To help prevent nausea and vomiting after your treatment, we encourage you to take your nausea medication as directed.  BELOW ARE SYMPTOMS THAT SHOULD BE REPORTED IMMEDIATELY: *FEVER GREATER THAN 100.4 F (38 C) OR HIGHER *CHILLS OR SWEATING *NAUSEA AND VOMITING THAT IS NOT CONTROLLED WITH YOUR NAUSEA MEDICATION *UNUSUAL SHORTNESS OF BREATH *UNUSUAL BRUISING OR BLEEDING *URINARY PROBLEMS (pain or burning when urinating, or frequent urination) *BOWEL PROBLEMS (unusual diarrhea, constipation, pain near the anus) TENDERNESS IN MOUTH AND THROAT WITH OR WITHOUT PRESENCE OF ULCERS (sore throat, sores in mouth, or a toothache) UNUSUAL RASH, SWELLING OR PAIN  UNUSUAL VAGINAL DISCHARGE OR ITCHING   Items with * indicate a potential emergency and should be followed up as soon as possible or go to the Emergency Department if any problems should occur.  Please show the CHEMOTHERAPY ALERT CARD or IMMUNOTHERAPY ALERT CARD at check-in to  the Emergency Department and triage nurse. Should you have questions after your visit or need to cancel or reschedule your appointment, please contact [facility]  [phone] and follow the prompts.  Office hours are 8:00 a.m. to 4:30 p.m. Monday - Friday. Please note that voicemails left after 4:00 p.m. may not be returned until the following business day.  We are closed weekends and major holidays. You have access to a nurse at all times for urgent questions. Please call the main number to the clinic [phone] and follow the prompts.  For any non-urgent questions, you may also contact your provider using MyChart. We now offer e-Visits for anyone 18 and older to request care online for non-urgent symptoms. For details visit [email or web address].   Also download the MyChart app! Go to the app store, search "MyChart", open the app, select , and log in with your MyChart username and password.  

## 2022-09-06 NOTE — Patient Instructions (Signed)

## 2022-09-06 NOTE — Progress Notes (Signed)
Ok to treat with ANC 1.1 per Dr. Ennever  

## 2022-09-07 ENCOUNTER — Other Ambulatory Visit: Payer: Self-pay

## 2022-09-07 LAB — CANCER ANTIGEN 19-9: CA 19-9: 64 U/mL — ABNORMAL HIGH (ref 0–35)

## 2022-09-13 ENCOUNTER — Inpatient Hospital Stay: Payer: 59

## 2022-09-13 ENCOUNTER — Other Ambulatory Visit: Payer: Self-pay

## 2022-09-13 ENCOUNTER — Encounter: Payer: Self-pay | Admitting: Hematology & Oncology

## 2022-09-13 ENCOUNTER — Inpatient Hospital Stay (HOSPITAL_BASED_OUTPATIENT_CLINIC_OR_DEPARTMENT_OTHER): Payer: 59 | Admitting: Hematology & Oncology

## 2022-09-13 VITALS — BP 146/89

## 2022-09-13 DIAGNOSIS — C259 Malignant neoplasm of pancreas, unspecified: Secondary | ICD-10-CM

## 2022-09-13 DIAGNOSIS — C787 Secondary malignant neoplasm of liver and intrahepatic bile duct: Secondary | ICD-10-CM | POA: Diagnosis not present

## 2022-09-13 DIAGNOSIS — Z5111 Encounter for antineoplastic chemotherapy: Secondary | ICD-10-CM | POA: Diagnosis not present

## 2022-09-13 DIAGNOSIS — Z5112 Encounter for antineoplastic immunotherapy: Secondary | ICD-10-CM | POA: Diagnosis not present

## 2022-09-13 DIAGNOSIS — C253 Malignant neoplasm of pancreatic duct: Secondary | ICD-10-CM | POA: Diagnosis not present

## 2022-09-13 LAB — CMP (CANCER CENTER ONLY)
ALT: 19 U/L (ref 0–44)
AST: 19 U/L (ref 15–41)
Albumin: 3.7 g/dL (ref 3.5–5.0)
Alkaline Phosphatase: 45 U/L (ref 38–126)
Anion gap: 5 (ref 5–15)
BUN: 41 mg/dL — ABNORMAL HIGH (ref 8–23)
CO2: 21 mmol/L — ABNORMAL LOW (ref 22–32)
Calcium: 9.4 mg/dL (ref 8.9–10.3)
Chloride: 107 mmol/L (ref 98–111)
Creatinine: 1.17 mg/dL — ABNORMAL HIGH (ref 0.44–1.00)
GFR, Estimated: 44 mL/min — ABNORMAL LOW (ref >60)
Glucose, Bld: 188 mg/dL — ABNORMAL HIGH (ref 70–99)
Potassium: 4.8 mmol/L (ref 3.5–5.1)
Sodium: 133 mmol/L — ABNORMAL LOW (ref 135–145)
Total Bilirubin: 0.3 mg/dL (ref 0.3–1.2)
Total Protein: 6.9 g/dL (ref 6.5–8.1)

## 2022-09-13 LAB — PREALBUMIN: Prealbumin: 24 mg/dL (ref 18–38)

## 2022-09-13 LAB — CBC WITH DIFFERENTIAL (CANCER CENTER ONLY)
Abs Immature Granulocytes: 0.14 10*3/uL — ABNORMAL HIGH (ref 0.00–0.07)
Basophils Absolute: 0 10*3/uL (ref 0.0–0.1)
Basophils Relative: 0 %
Eosinophils Absolute: 0 10*3/uL (ref 0.0–0.5)
Eosinophils Relative: 1 %
HCT: 29.2 % — ABNORMAL LOW (ref 36.0–46.0)
Hemoglobin: 9.7 g/dL — ABNORMAL LOW (ref 12.0–15.0)
Immature Granulocytes: 2 %
Lymphocytes Relative: 34 %
Lymphs Abs: 2 10*3/uL (ref 0.7–4.0)
MCH: 31.7 pg (ref 26.0–34.0)
MCHC: 33.2 g/dL (ref 30.0–36.0)
MCV: 95.4 fL (ref 80.0–100.0)
Monocytes Absolute: 0.5 10*3/uL (ref 0.1–1.0)
Monocytes Relative: 8 %
Neutro Abs: 3.2 10*3/uL (ref 1.7–7.7)
Neutrophils Relative %: 55 %
Platelet Count: 140 10*3/uL — ABNORMAL LOW (ref 150–400)
RBC: 3.06 MIL/uL — ABNORMAL LOW (ref 3.87–5.11)
RDW: 15.8 % — ABNORMAL HIGH (ref 11.5–15.5)
WBC Count: 5.8 10*3/uL (ref 4.0–10.5)
nRBC: 0 % (ref 0.0–0.2)

## 2022-09-13 LAB — LACTATE DEHYDROGENASE: LDH: 222 U/L — ABNORMAL HIGH (ref 98–192)

## 2022-09-13 LAB — CEA (IN HOUSE-CHCC): CEA (CHCC-In House): 8.33 ng/mL — ABNORMAL HIGH (ref 0.00–5.00)

## 2022-09-13 MED ORDER — COLD PACK MISC ONCOLOGY: 1.0000 | Freq: Once | Status: DC | PRN

## 2022-09-13 MED ORDER — SODIUM CHLORIDE 0.9 % IV SOLN: Freq: Once | INTRAVENOUS | Status: AC

## 2022-09-13 MED ORDER — SODIUM CHLORIDE 0.9 % IV SOLN: Freq: Once | INTRAVENOUS | Status: DC

## 2022-09-13 MED ORDER — SODIUM CHLORIDE 0.9% FLUSH: 3.0000 mL | INTRAVENOUS | Status: DC | PRN

## 2022-09-13 MED ORDER — HEPARIN SOD (PORK) LOCK FLUSH 100 UNIT/ML IV SOLN
500.0000 [IU] | Freq: Once | INTRAVENOUS | Status: AC | PRN
  Administered 2022-09-13: 500 [IU]

## 2022-09-13 MED ORDER — SODIUM CHLORIDE 0.9 % IV SOLN
700.0000 mg/m2 | Freq: Once | INTRAVENOUS | Status: AC
  Administered 2022-09-13: 1102 mg via INTRAVENOUS
  Filled 2022-09-13: qty 26.58

## 2022-09-13 MED ORDER — SODIUM CHLORIDE 0.9 % IV SOLN
510.0000 mg | Freq: Once | INTRAVENOUS | Status: AC
  Administered 2022-09-13: 510 mg via INTRAVENOUS
  Filled 2022-09-13: qty 17

## 2022-09-13 MED ORDER — PROCHLORPERAZINE MALEATE 10 MG PO TABS
10.0000 mg | ORAL_TABLET | Freq: Once | ORAL | Status: AC
  Administered 2022-09-13: 10 mg via ORAL
  Filled 2022-09-13: qty 1

## 2022-09-13 MED ORDER — SODIUM CHLORIDE 0.9% FLUSH
10.0000 mL | INTRAVENOUS | Status: DC | PRN
  Administered 2022-09-13: 10 mL

## 2022-09-13 NOTE — Patient Instructions (Signed)

## 2022-09-13 NOTE — Progress Notes (Signed)
Hematology and Oncology Follow Up Visit  Angelica Benson [id number] 05/20/1928 89 y.o. 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 Angelica Benson is here today with her daughter for follow-up.  She is doing much better.  I am not sure as to what changes.  However, she is doing better.  She is eating okay.  She actually is staying with her daughter right now.  I think this is helping her.  When we last saw her, we held on her treatment just because of her white cells being on the lower side.  She does have low iron.  Her iron saturation is 9%.  We are going give her dose of IV iron.  She did fall.  She fell on some concrete.  Thankfully she did not break anything.  She did have a low gash on her scalp.  This is on her forehead.  She did not need any sutures.  Her last CA 19-9 was little bit more elevated at 64.  Know that she is going to have a MRI hopefully before we get her back for her next treatment.  I am just happy that her memory seems to be doing a little bit better.  I am happy that she is with her daughter right now.  I told her that it probably would not be a bad idea if she could stay with her daughter for a while.   Currently, I would say that her performance status is probably ECOG 2.     Wt Readings from Last 3 Encounters:  09/13/22 136 lb (61.7 kg)  09/06/22 132 lb (59.9 kg)  08/09/22 143 lb (64.9 kg)      Medications:  Allergies as of 09/13/2022       Reactions   Buprenorphine Hcl Rash   Morphine And Codeine Rash        Medication List        Accurate as of Sep 13, 2022  1:12 PM. If you have any questions, ask your nurse or doctor.          Accu-Chek FastClix Lancets Misc Check blood sugar once to 2 times a day What changed:  additional instructions   Accu-Chek Guide test strip Generic drug: glucose blood Check blood sugars 1-2 times daily   acetaminophen 500 MG tablet Commonly known as: TYLENOL Take 2 tablets (1,000 mg total) by mouth 2 (two) times daily as needed for moderate pain.   albuterol 108 (90 Base) MCG/ACT inhaler Commonly known as: VENTOLIN HFA USE 2 INHALATIONS BY MOUTH EVERY 6 HOURS AS NEEDED FOR WHEEZING  OR SHORTNESS OF BREATH   aspirin EC 81 MG tablet Take 81 mg by mouth daily.   atorvastatin 10 MG tablet Commonly known as: LIPITOR TAKE 1 TABLET BY MOUTH DAILY   betamethasone dipropionate 0.05 % cream APPLY TOPICALLY TO AFFECTED  AREA(S) TWICE DAILY   Centrum Silver 50+Women Tabs Take 1 tablet by mouth daily.   escitalopram 5 MG tablet Commonly known as: LEXAPRO TAKE 1 TABLET BY MOUTH DAILY   fluticasone 50 MCG/ACT nasal spray Commonly known as: FLONASE USE 2 SPRAYS IN BOTH NOSTRILS  DAILY   lisinopril 10 MG tablet Commonly known as: ZESTRIL TAKE 1 TABLET BY MOUTH DAILY   megestrol 400 MG/10ML suspension Commonly known as: MEGACE  Take 10 mLs (400 mg total) by mouth 2 (two) times daily. What changed: when to take this   metFORMIN 500 MG tablet Commonly known as: GLUCOPHAGE TAKE 1 TABLET BY MOUTH TWICE  DAILY WITH A MEAL   mometasone-formoterol 200-5 MCG/ACT Aero Commonly known as: DULERA Inhale 1 puff into the lungs 2 (two) times daily.   nitroGLYCERIN 0.4 MG SL tablet Commonly known as: NITROSTAT DISSOLVE 1 TABLET UNDER THE TONGUE EVERY 5 MINUTES AS  NEEDED FOR CHEST PAIN. MAX  OF 3 TABLETS IN 15 MINUTES. CALL 911 IF PAIN PERSISTS.   omeprazole 20 MG capsule Commonly known as: PRILOSEC TAKE 1 CAPSULE BY MOUTH DAILY        Allergies:  Allergies  Allergen Reactions   Buprenorphine Hcl Rash   Morphine And Codeine Rash    Past Medical History, Surgical history, Social history, and Family History were reviewed and updated.  Review of Systems: Review of  Systems  Constitutional: Negative.   HENT: Negative.    Eyes: Negative.   Respiratory: Negative.    Cardiovascular: Negative.   Gastrointestinal: Negative.   Genitourinary: Negative.   Musculoskeletal: Negative.   Skin: Negative.   Neurological: Negative.   Endo/Heme/Allergies: Negative.   Psychiatric/Behavioral: Negative.        Physical Exam:  height is 5\' 1"  (1.549 m) and weight is 136 lb (61.7 kg). Her oral temperature is 97.6 F (36.4 C). Her blood pressure is 157/66 (abnormal) and her pulse is 70. Her respiration is 18 and oxygen saturation is 100%.   Wt Readings from Last 3 Encounters:  09/13/22 136 lb (61.7 kg)  09/06/22 132 lb (59.9 kg)  08/09/22 143 lb (64.9 kg)   Physical Exam Vitals reviewed.  HENT:     Head: Normocephalic and atraumatic.  Eyes:     Pupils: Pupils are equal, round, and reactive to light.  Cardiovascular:     Rate and Rhythm: Normal rate and regular rhythm.     Heart sounds: Normal heart sounds.  Pulmonary:     Effort: Pulmonary effort is normal.     Breath sounds: Normal breath sounds.  Abdominal:     General: Bowel sounds are normal.     Palpations: Abdomen is soft.  Musculoskeletal:        General: No tenderness or deformity. Normal range of motion.     Cervical back: Normal range of motion.  Lymphadenopathy:     Cervical: No cervical adenopathy.  Skin:    General: Skin is warm and dry.     Findings: No erythema or rash.  Neurological:     Mental Status: She is alert and oriented to person, place, and time.  Psychiatric:        Behavior: Behavior normal.        Thought Content: Thought content normal.        Judgment: Judgment normal.      Lab Results  Component Value Date   WBC 5.8 09/13/2022   HGB 9.7 (L) 09/13/2022   HCT 29.2 (L) 09/13/2022   MCV 95.4 09/13/2022   PLT 140 (L) 09/13/2022   Lab Results  Component Value Date   FERRITIN 163 09/06/2022   IRON 27 (L) 09/06/2022   TIBC 314 09/06/2022   UIBC 287  09/06/2022   IRONPCTSAT 9 (L) 09/06/2022   Lab Results  Component Value Date   RBC 3.06 (L) 09/13/2022   No results found for: "KPAFRELGTCHN", "LAMBDASER", "KAPLAMBRATIO" No results found for: "IGGSERUM", "IGA", "IGMSERUM" No results found  for: "TOTALPROTELP", "ALBUMINELP", "A1GS", "A2GS", "BETS", "BETA2SER", "GAMS", "MSPIKE", "SPEI"   Chemistry      Component Value Date/Time   NA 133 (L) 09/13/2022 1200   K 4.8 09/13/2022 1200   CL 107 09/13/2022 1200   CO2 21 (L) 09/13/2022 1200   BUN 41 (H) 09/13/2022 1200   CREATININE 1.17 (H) 09/13/2022 1200   CREATININE 0.92 06/29/2021 1450      Component Value Date/Time   CALCIUM 9.4 09/13/2022 1200   ALKPHOS 45 09/13/2022 1200   AST 19 09/13/2022 1200   ALT 19 09/13/2022 1200   BILITOT 0.3 09/13/2022 1200       Impression and Plan: Ms. 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   We will go ahead and get her back on the gemcitabine with the Durvalumab.  This will be her 9th cycle of treatment.  I am just very pleased and happy that she has done so well.   Again, we have to get the MRI set up for before we see her back.  Will try to get the MRI set up for a couple weeks.   Josph Macho, MD 5/31/20241:12 PM

## 2022-09-13 NOTE — Patient Instructions (Signed)
[facility]  Discharge Instructions: Thank you for choosing [facility] to provide your oncology and hematology care.   If you have a lab appointment with the Cancer Center, please go directly to the Cancer Center and check in at the registration area.  Wear comfortable clothing and clothing appropriate for easy access to any Portacath or PICC line.   We strive to give you quality time with your provider. You may need to reschedule your appointment if you arrive late (15 or more minutes).  Arriving late affects you and other patients whose appointments are after yours.  Also, if you miss three or more appointments without notifying the office, you may be dismissed from the clinic at the provider's discretion.      For prescription refill requests, have your pharmacy contact our office and allow 72 hours for refills to be completed.    Today you received the following chemotherapy and/or immunotherapy agents gemzar, feraheme       To help prevent nausea and vomiting after your treatment, we encourage you to take your nausea medication as directed.  BELOW ARE SYMPTOMS THAT SHOULD BE REPORTED IMMEDIATELY: *FEVER GREATER THAN 100.4 F (38 C) OR HIGHER *CHILLS OR SWEATING *NAUSEA AND VOMITING THAT IS NOT CONTROLLED WITH YOUR NAUSEA MEDICATION *UNUSUAL SHORTNESS OF BREATH *UNUSUAL BRUISING OR BLEEDING *URINARY PROBLEMS (pain or burning when urinating, or frequent urination) *BOWEL PROBLEMS (unusual diarrhea, constipation, pain near the anus) TENDERNESS IN MOUTH AND THROAT WITH OR WITHOUT PRESENCE OF ULCERS (sore throat, sores in mouth, or a toothache) UNUSUAL RASH, SWELLING OR PAIN  UNUSUAL VAGINAL DISCHARGE OR ITCHING   Items with * indicate a potential emergency and should be followed up as soon as possible or go to the Emergency Department if any problems should occur.  Please show the CHEMOTHERAPY ALERT CARD or IMMUNOTHERAPY ALERT CARD at  check-in to the Emergency Department and triage nurse. Should you have questions after your visit or need to cancel or reschedule your appointment, please contact [facility] [place]  [phone] and follow the prompts.  Office hours are 8:00 a.m. to 4:30 p.m. Monday - Friday. Please note that voicemails left after 4:00 p.m. may not be returned until the following business day.  We are closed weekends and major holidays. You have access to a nurse at all times for urgent questions. Please call the main number to the clinic [phone] and follow the prompts.  For any non-urgent questions, you may also contact your provider using MyChart. We now offer e-Visits for anyone 32 and older to request care online for non-urgent symptoms. For details visit [email or web address].   Also download the MyChart app! Go to the app store, search "MyChart", open the app, select [facility], and log in with your MyChart username and password.

## 2022-09-17 LAB — CANCER ANTIGEN 19-9: CA 19-9: 64 U/mL — ABNORMAL HIGH (ref 0–35)

## 2022-09-19 ENCOUNTER — Other Ambulatory Visit: Payer: Self-pay

## 2022-09-20 ENCOUNTER — Other Ambulatory Visit: Payer: Self-pay

## 2022-09-22 ENCOUNTER — Ambulatory Visit (HOSPITAL_COMMUNITY)
Admission: RE | Admit: 2022-09-22 | Discharge: 2022-09-22 | Disposition: A | Discharge status: Home / Self Care | Payer: 59 | Source: Ambulatory Visit | Attending: Hematology & Oncology | Admitting: Hematology & Oncology

## 2022-09-22 DIAGNOSIS — K7689 Other specified diseases of liver: Secondary | ICD-10-CM | POA: Diagnosis not present

## 2022-09-22 DIAGNOSIS — C787 Secondary malignant neoplasm of liver and intrahepatic bile duct: Secondary | ICD-10-CM | POA: Insufficient documentation

## 2022-09-22 DIAGNOSIS — N281 Cyst of kidney, acquired: Secondary | ICD-10-CM | POA: Diagnosis not present

## 2022-09-22 DIAGNOSIS — C259 Malignant neoplasm of pancreas, unspecified: Secondary | ICD-10-CM | POA: Insufficient documentation

## 2022-09-22 MED ORDER — GADOBUTROL 1 MMOL/ML IV SOLN
6.0000 mL | Freq: Once | INTRAVENOUS | Status: AC | PRN
  Administered 2022-09-22: 6 mL via INTRAVENOUS

## 2022-09-22 MED ORDER — HEPARIN SOD (PORK) LOCK FLUSH 100 UNIT/ML IV SOLN
INTRAVENOUS | Status: AC
  Filled 2022-09-22: qty 5

## 2022-09-23 ENCOUNTER — Other Ambulatory Visit: Payer: Self-pay

## 2022-09-23 ENCOUNTER — Encounter (HOSPITAL_BASED_OUTPATIENT_CLINIC_OR_DEPARTMENT_OTHER): Payer: Self-pay

## 2022-09-23 ENCOUNTER — Emergency Department (HOSPITAL_BASED_OUTPATIENT_CLINIC_OR_DEPARTMENT_OTHER)
Admission: EM | Admit: 2022-09-23 | Discharge: 2022-09-23 | Disposition: A | Discharge status: Home / Self Care | Payer: 59 | Attending: Emergency Medicine | Admitting: Emergency Medicine

## 2022-09-23 ENCOUNTER — Emergency Department (HOSPITAL_BASED_OUTPATIENT_CLINIC_OR_DEPARTMENT_OTHER): Payer: 59

## 2022-09-23 ENCOUNTER — Inpatient Hospital Stay: Payer: 59 | Attending: Hematology & Oncology

## 2022-09-23 VITALS — BP 106/47 | HR 80 | Temp 98.0°F | Resp 16

## 2022-09-23 DIAGNOSIS — R531 Weakness: Secondary | ICD-10-CM | POA: Diagnosis not present

## 2022-09-23 DIAGNOSIS — R55 Syncope and collapse: Secondary | ICD-10-CM | POA: Insufficient documentation

## 2022-09-23 DIAGNOSIS — C259 Malignant neoplasm of pancreas, unspecified: Secondary | ICD-10-CM

## 2022-09-23 LAB — COMPREHENSIVE METABOLIC PANEL
ALT: 25 U/L (ref 0–44)
AST: 29 U/L (ref 15–41)
Albumin: 3 g/dL — ABNORMAL LOW (ref 3.5–5.0)
Alkaline Phosphatase: 44 U/L (ref 38–126)
Anion gap: 8 (ref 5–15)
BUN: 22 mg/dL (ref 8–23)
CO2: 19 mmol/L — ABNORMAL LOW (ref 22–32)
Calcium: 8.2 mg/dL — ABNORMAL LOW (ref 8.9–10.3)
Chloride: 108 mmol/L (ref 98–111)
Creatinine, Ser: 0.93 mg/dL (ref 0.44–1.00)
GFR, Estimated: 58 mL/min — ABNORMAL LOW (ref >60)
Glucose, Bld: 138 mg/dL — ABNORMAL HIGH (ref 70–99)
Potassium: 3.6 mmol/L (ref 3.5–5.1)
Sodium: 135 mmol/L (ref 135–145)
Total Bilirubin: 0.8 mg/dL (ref 0.3–1.2)
Total Protein: 6 g/dL — ABNORMAL LOW (ref 6.5–8.1)

## 2022-09-23 LAB — CBC WITH DIFFERENTIAL/PLATELET
Abs Immature Granulocytes: 0.13 10*3/uL — ABNORMAL HIGH (ref 0.00–0.07)
Basophils Absolute: 0 10*3/uL (ref 0.0–0.1)
Basophils Relative: 0 %
Eosinophils Absolute: 0 10*3/uL (ref 0.0–0.5)
Eosinophils Relative: 0 %
HCT: 32.1 % — ABNORMAL LOW (ref 36.0–46.0)
Hemoglobin: 10.4 g/dL — ABNORMAL LOW (ref 12.0–15.0)
Immature Granulocytes: 3 %
Lymphocytes Relative: 17 %
Lymphs Abs: 0.8 10*3/uL (ref 0.7–4.0)
MCH: 32 pg (ref 26.0–34.0)
MCHC: 32.4 g/dL (ref 30.0–36.0)
MCV: 98.8 fL (ref 80.0–100.0)
Monocytes Absolute: 0.5 10*3/uL (ref 0.1–1.0)
Monocytes Relative: 11 %
Neutro Abs: 3.2 10*3/uL (ref 1.7–7.7)
Neutrophils Relative %: 69 %
Platelets: 157 10*3/uL (ref 150–400)
RBC: 3.25 MIL/uL — ABNORMAL LOW (ref 3.87–5.11)
RDW: 16 % — ABNORMAL HIGH (ref 11.5–15.5)
WBC: 4.7 10*3/uL (ref 4.0–10.5)
nRBC: 0 % (ref 0.0–0.2)

## 2022-09-23 MED ORDER — SODIUM CHLORIDE 0.9% FLUSH
10.0000 mL | Freq: Once | INTRAVENOUS | Status: AC | PRN
  Administered 2022-09-23: 10 mL

## 2022-09-23 MED ORDER — FAMOTIDINE IN NACL 20-0.9 MG/50ML-% IV SOLN
20.0000 mg | Freq: Once | INTRAVENOUS | Status: AC
  Administered 2022-09-23: 20 mg via INTRAVENOUS

## 2022-09-23 MED ORDER — SODIUM CHLORIDE 0.9 % IV SOLN
510.0000 mg | Freq: Once | INTRAVENOUS | Status: AC
  Administered 2022-09-23: 510 mg via INTRAVENOUS
  Filled 2022-09-23: qty 17

## 2022-09-23 MED ORDER — SODIUM CHLORIDE 0.9 % IV SOLN: Freq: Once | INTRAVENOUS | Status: AC

## 2022-09-23 MED ORDER — ONDANSETRON HCL 4 MG/2ML IJ SOLN
4.0000 mg | Freq: Once | INTRAMUSCULAR | Status: AC
  Administered 2022-09-23: 4 mg via INTRAVENOUS

## 2022-09-23 MED ORDER — SODIUM CHLORIDE 0.9 % IV BOLUS
1000.0000 mL | Freq: Once | INTRAVENOUS | Status: AC
  Administered 2022-09-23: 1000 mL via INTRAVENOUS

## 2022-09-23 MED ORDER — ONDANSETRON HCL 4 MG/2ML IJ SOLN
INTRAMUSCULAR | Status: AC
  Filled 2022-09-23: qty 2

## 2022-09-23 MED ORDER — SODIUM CHLORIDE 0.9 % IV SOLN: 8.0000 mg | Freq: Once | INTRAVENOUS | Status: DC

## 2022-09-23 MED ORDER — HEPARIN SOD (PORK) LOCK FLUSH 100 UNIT/ML IV SOLN
500.0000 [IU] | Freq: Once | INTRAVENOUS | Status: AC
  Administered 2022-09-23: 500 [IU]
  Filled 2022-09-23: qty 5

## 2022-09-23 MED ORDER — HEPARIN SOD (PORK) LOCK FLUSH 100 UNIT/ML IV SOLN
500.0000 [IU] | Freq: Once | INTRAVENOUS | Status: AC | PRN
  Administered 2022-09-23: 500 [IU]

## 2022-09-23 NOTE — Patient Instructions (Signed)

## 2022-09-23 NOTE — ED Provider Notes (Signed)
[facility] Provider Note   CSN: [id number] Arrival date & time: 09/23/22  1440     History  Chief Complaint  Patient presents with   Near Syncope    Angelica Benson is a 89 y.o. female.  The history is provided by the patient.  Near Syncope This is a new problem. The current episode started less than 1 hour ago. The problem has been resolved. Pertinent negatives include no chest pain, no abdominal pain, no headaches and no shortness of breath. Associated symptoms comments: During iron transfusion at cancer center got lightheaded, threw up and had diarrhea, did not pass out. Hx of pancreatic cancer undergoing treatment. Asymptomatic now. Second time receiving iron transfusion. No chest pain or SOB, or abdominal pain. Felt fine this morning.  . Nothing aggravates the symptoms. Nothing relieves the symptoms. She has tried nothing for the symptoms. The treatment provided no relief.       Home Medications Prior to Admission medications   Medication Sig Start Date End Date Taking? Authorizing Provider  Accu-Chek FastClix Lancets MISC Check blood sugar once to 2 times a day Patient taking differently: Check blood sugar daily. 06/19/22   [name], NP  acetaminophen (TYLENOL) 500 MG tablet Take 2 tablets (1,000 mg total) by mouth 2 (two) times daily as needed for moderate pain. 08/15/17   [name], NP  albuterol (VENTOLIN HFA) 108 (90 Base) MCG/ACT inhaler USE 2 INHALATIONS BY MOUTH EVERY 6 HOURS AS NEEDED FOR WHEEZING  OR SHORTNESS OF BREATH 03/12/22   [name], NP  aspirin EC 81 MG tablet Take 81 mg by mouth daily.    [provider]  atorvastatin (LIPITOR) 10 MG tablet TAKE 1 TABLET BY MOUTH DAILY 04/07/22   [name], NP  betamethasone dipropionate 0.05 % cream APPLY TOPICALLY TO AFFECTED  AREA(S) TWICE DAILY Patient not taking: Reported on 06/07/2022 04/07/22   [name], NP   escitalopram (LEXAPRO) 5 MG tablet TAKE 1 TABLET BY MOUTH DAILY 06/12/22   [name], NP  fluticasone [facility]) 50 MCG/ACT nasal spray USE 2 SPRAYS IN BOTH NOSTRILS  DAILY 04/07/22 05/07/22  [name], NP  glucose blood (ACCU-CHEK GUIDE) test strip Check blood sugars 1-2 times daily 01/22/22   [name], NP  lisinopril (ZESTRIL) 10 MG tablet TAKE 1 TABLET BY MOUTH DAILY 06/09/22   [name], NP  megestrol (MEGACE) 400 MG/10ML suspension Take 10 mLs (400 mg total) by mouth 2 (two) times daily. Patient taking differently: Take 400 mg by mouth daily. 07/19/22   Josph Macho, MD  metFORMIN (GLUCOPHAGE) 500 MG tablet TAKE 1 TABLET BY MOUTH TWICE  DAILY WITH A MEAL 04/07/22   [name], NP  mometasone-formoterol (DULERA) 200-5 MCG/ACT AERO Inhale 1 puff into the lungs 2 (two) times daily. 12/18/21   [name], NP  Multiple Vitamins-Minerals (CENTRUM SILVER 50+WOMEN) TABS Take 1 tablet by mouth daily. 04/28/18   [name], NP  nitroGLYCERIN (NITROSTAT) 0.4 MG SL tablet DISSOLVE 1 TABLET UNDER THE TONGUE EVERY 5 MINUTES AS  NEEDED FOR CHEST PAIN. MAX  OF 3 TABLETS IN 15 MINUTES. CALL 911 IF PAIN PERSISTS. Patient not taking: Reported on 06/07/2022 07/24/21   [name], NP  omeprazole (PRILOSEC) 20 MG capsule TAKE 1 CAPSULE BY MOUTH DAILY 04/07/22   [name], NP  prochlorperazine (COMPAZINE) 10 MG tablet Take 1 tablet (10 mg total) by mouth every 6 (six) hours as needed (Nausea or vomiting). Patient not taking:  Reported on 11/22/2021 10/29/21 11/29/21  Josph Macho, MD      Allergies    Buprenorphine hcl and Morphine and codeine    Review of Systems   Review of Systems  Respiratory:  Negative for shortness of breath.   Cardiovascular:  Positive for near-syncope. Negative for chest pain.  Gastrointestinal:  Negative for abdominal pain.  Neurological:  Negative for headaches.    Physical Exam Updated Vital  Signs BP (!) 132/54 (BP Location: Left Arm)   Pulse 71   Temp (!) 97.5 F (36.4 C) (Oral)   Resp 17   Ht 5' (1.524 m)   Wt 62.9 kg   SpO2 96%   BMI 27.08 kg/m  Physical Exam Vitals and nursing note reviewed.  Constitutional:      General: She is not in acute distress.    Appearance: She is well-developed. She is not ill-appearing.  HENT:     Head: Normocephalic and atraumatic.     Nose: Nose normal.     Mouth/Throat:     Mouth: Mucous membranes are moist.  Eyes:     Extraocular Movements: Extraocular movements intact.     Conjunctiva/sclera: Conjunctivae normal.     Pupils: Pupils are equal, round, and reactive to light.  Cardiovascular:     Rate and Rhythm: Normal rate and regular rhythm.     Pulses: Normal pulses.     Heart sounds: Normal heart sounds. No murmur heard. Pulmonary:     Effort: Pulmonary effort is normal. No respiratory distress.     Breath sounds: Normal breath sounds.  Abdominal:     General: Abdomen is flat.     Palpations: Abdomen is soft.     Tenderness: There is no abdominal tenderness.  Musculoskeletal:        General: No swelling.     Cervical back: Normal range of motion and neck supple.  Skin:    General: Skin is warm and dry.     Capillary Refill: Capillary refill takes less than 2 seconds.  Neurological:     General: No focal deficit present.     Mental Status: She is alert and oriented to person, place, and time.     Cranial Nerves: No cranial nerve deficit.     Sensory: No sensory deficit.     Motor: No weakness.     Coordination: Coordination normal.  Psychiatric:        Mood and Affect: Mood normal.     ED Results / Procedures / Treatments   Labs (all labs ordered are listed, but only abnormal results are displayed) Labs Reviewed  CBC WITH DIFFERENTIAL/PLATELET - Abnormal; Notable for the following components:      Result Value   RBC 3.25 (*)    Hemoglobin 10.4 (*)    HCT 32.1 (*)    RDW 16.0 (*)    Abs Immature  Granulocytes 0.13 (*)    All other components within normal limits  COMPREHENSIVE METABOLIC PANEL - Abnormal; Notable for the following components:   CO2 19 (*)    Glucose, Bld 138 (*)    Calcium 8.2 (*)    Total Protein 6.0 (*)    Albumin 3.0 (*)    GFR, Estimated 58 (*)    All other components within normal limits    EKG EKG Interpretation  Date/Time:  Monday September 23 2022 14:53:10 EDT Ventricular Rate:  87 PR Interval:  159 QRS Duration: 132 QT Interval:  397 QTC Calculation: 478 R Axis:   -  80 Text Interpretation: Sinus rhythm RBBB and LAFB Confirmed by Virgina Norfolk ([id number]) on 09/23/2022 3:24:57 PM  Radiology DG Chest Portable 1 View  Result Date: 09/23/2022 CLINICAL DATA:  Weakness EXAM: PORTABLE CHEST 1 VIEW COMPARISON:  11/10/2021 FINDINGS: Right chest port remains in place. The heart size and mediastinal contours are within normal limits. Aortic atherosclerosis. Both lungs are clear. The visualized skeletal structures are unremarkable. IMPRESSION: No active disease. Electronically Signed   By: Duanne Guess D.O.   On: 09/23/2022 16:11    Procedures Procedures    Medications Ordered in ED Medications  sodium chloride 0.9 % bolus 1,000 mL (1,000 mLs Intravenous New Bag/Given 09/23/22 1528)    ED Course/ Medical Decision Making/ A&P                             Medical Decision Making Amount and/or Complexity of Data Reviewed Labs: ordered. Radiology: ordered.   Angelica Benson is here after near syncopal event at oncology clinic.  Was given transfusion of iron when she got a little bit lightheaded throughout the had episode of diarrhea.  She is asymptomatic now.  She has normal vitals.  No fever.  Her oxygen levels are normal.  She has bad waveform at baseline I do not think she is hypoxic.  When I have her with a good oxygen waveform her oxygen levels are normal.  She is not short of breath.  She has pancreatic cancer undergoing treatments.  History of asthma and  CHF.  Overall she is asymptomatic.  Sounds like differential diagnosis is likely vasovagal type event/may be mild allergic reaction.  This is the second time she has had iron transfusions.  Blood work unremarkable per my review and interpretation.  She got IV fluids.  Overall she is asymptomatic on my care.  Labs at baseline per my review interpretation.  Chest x-ray without any evidence of pneumonia or pneumothorax.  EKG shows sinus rhythm.  I have no concern for emergent cardiac or pulmonary process.  She is asymptomatic.  Discharged home in good condition.  This chart was dictated using voice recognition software.  Despite best efforts to proofread,  errors can occur which can change the documentation meaning.         Final Clinical Impression(s) / ED Diagnoses Final diagnoses:  Near syncope    Rx / DC Orders ED Discharge Orders     None         Virgina Norfolk, DO 09/23/22 1644

## 2022-09-23 NOTE — ED Notes (Signed)
Pt is alert and speaking with family. Oxygen level 89%. Nasal cannula applied.

## 2022-09-23 NOTE — ED Triage Notes (Signed)
Pt reports to ED after having a syncopal episode in the Cancer Center. Pt has pancreatic cancer and had her 2nd iron infusion done today. Pt reports no pain.

## 2022-09-24 ENCOUNTER — Other Ambulatory Visit: Payer: Self-pay

## 2022-09-24 ENCOUNTER — Ambulatory Visit: Payer: 59 | Admitting: Podiatry

## 2022-09-24 DIAGNOSIS — C259 Malignant neoplasm of pancreas, unspecified: Secondary | ICD-10-CM

## 2022-09-30 ENCOUNTER — Ambulatory Visit: Payer: 59

## 2022-10-03 ENCOUNTER — Other Ambulatory Visit: Payer: Self-pay

## 2022-10-07 ENCOUNTER — Encounter: Payer: Self-pay | Admitting: Podiatry

## 2022-10-07 ENCOUNTER — Ambulatory Visit (INDEPENDENT_AMBULATORY_CARE_PROVIDER_SITE_OTHER): Payer: 59 | Admitting: Podiatry

## 2022-10-07 DIAGNOSIS — M79675 Pain in left toe(s): Secondary | ICD-10-CM

## 2022-10-07 DIAGNOSIS — E119 Type 2 diabetes mellitus without complications: Secondary | ICD-10-CM

## 2022-10-07 DIAGNOSIS — B351 Tinea unguium: Secondary | ICD-10-CM

## 2022-10-07 DIAGNOSIS — M79674 Pain in right toe(s): Secondary | ICD-10-CM | POA: Diagnosis not present

## 2022-10-07 NOTE — Progress Notes (Signed)
This patient returns to my office for at risk foot care.  This patient requires this care by a professional since this patient will be at risk due to having  Type 2 diabetes.  This patient is unable to cut nails herself since the patient cannot reach her nails.These nails are painful walking and wearing shoes.   Patient presents to the office with her daughter. This patient presents for at risk foot care today.  General Appearance  Alert, conversant and in no acute stress.  Vascular  Dorsalis pedis  are weakly  palpable  bilaterally.  Posterior tibial pulses are not palpable  B/L. Absent digital hair. Capillary return is within normal limits  bilaterally. Temperature is within normal limits  bilaterally.  Neurologic  Senn-Weinstein monofilament wire test diminished  bilaterally. Muscle power within normal limits bilaterally.  Nails Thick disfigured discolored nails with subungual debris  from hallux to fifth toes bilaterally. No evidence of bacterial infection or drainage bilaterally.  Orthopedic  No limitations of motion  feet .  No crepitus or effusions noted.  No bony pathology or digital deformities noted.  Skin  normotropic skin with no porokeratosis noted bilaterally.  No signs of infections or ulcers noted.     Onychomycosis  Pain in right toes  Pain in left toes  Consent was obtained for treatment procedures.   Mechanical debridement of nails 1-5  bilaterally performed with a nail nipper.  Filed with dremel without incident.    Return office visit  10 weeks                    Told patient to return for periodic foot care and evaluation due to potential at risk complications.   Angles Trevizo DPM   

## 2022-10-08 ENCOUNTER — Other Ambulatory Visit: Payer: Self-pay

## 2022-10-15 ENCOUNTER — Other Ambulatory Visit: Payer: Self-pay

## 2022-10-15 ENCOUNTER — Inpatient Hospital Stay (HOSPITAL_BASED_OUTPATIENT_CLINIC_OR_DEPARTMENT_OTHER): Payer: 59 | Admitting: Hematology & Oncology

## 2022-10-15 ENCOUNTER — Inpatient Hospital Stay: Payer: 59 | Attending: Hematology & Oncology

## 2022-10-15 ENCOUNTER — Inpatient Hospital Stay: Payer: 59

## 2022-10-15 ENCOUNTER — Encounter: Payer: Self-pay | Admitting: Hematology & Oncology

## 2022-10-15 VITALS — BP 124/55 | HR 87 | Temp 97.7°F | Resp 16 | Ht 61.0 in | Wt 137.0 lb

## 2022-10-15 DIAGNOSIS — Z5112 Encounter for antineoplastic immunotherapy: Secondary | ICD-10-CM | POA: Insufficient documentation

## 2022-10-15 DIAGNOSIS — E119 Type 2 diabetes mellitus without complications: Secondary | ICD-10-CM | POA: Diagnosis not present

## 2022-10-15 DIAGNOSIS — C787 Secondary malignant neoplasm of liver and intrahepatic bile duct: Secondary | ICD-10-CM | POA: Insufficient documentation

## 2022-10-15 DIAGNOSIS — I5032 Chronic diastolic (congestive) heart failure: Secondary | ICD-10-CM

## 2022-10-15 DIAGNOSIS — C259 Malignant neoplasm of pancreas, unspecified: Secondary | ICD-10-CM

## 2022-10-15 DIAGNOSIS — G459 Transient cerebral ischemic attack, unspecified: Secondary | ICD-10-CM

## 2022-10-15 DIAGNOSIS — R42 Dizziness and giddiness: Secondary | ICD-10-CM

## 2022-10-15 DIAGNOSIS — D509 Iron deficiency anemia, unspecified: Secondary | ICD-10-CM | POA: Insufficient documentation

## 2022-10-15 DIAGNOSIS — I10 Essential (primary) hypertension: Secondary | ICD-10-CM | POA: Diagnosis not present

## 2022-10-15 DIAGNOSIS — I951 Orthostatic hypotension: Secondary | ICD-10-CM | POA: Diagnosis not present

## 2022-10-15 DIAGNOSIS — C253 Malignant neoplasm of pancreatic duct: Secondary | ICD-10-CM | POA: Insufficient documentation

## 2022-10-15 LAB — CMP (CANCER CENTER ONLY)
ALT: 41 U/L (ref 0–44)
AST: 19 U/L (ref 15–41)
Albumin: 3.8 g/dL (ref 3.5–5.0)
Alkaline Phosphatase: 57 U/L (ref 38–126)
Anion gap: 10 (ref 5–15)
BUN: 42 mg/dL — ABNORMAL HIGH (ref 8–23)
CO2: 22 mmol/L (ref 22–32)
Calcium: 9.6 mg/dL (ref 8.9–10.3)
Chloride: 102 mmol/L (ref 98–111)
Creatinine: 1.3 mg/dL — ABNORMAL HIGH (ref 0.44–1.00)
GFR, Estimated: 39 mL/min — ABNORMAL LOW (ref >60)
Glucose, Bld: 270 mg/dL — ABNORMAL HIGH (ref 70–99)
Potassium: 5 mmol/L (ref 3.5–5.1)
Sodium: 134 mmol/L — ABNORMAL LOW (ref 135–145)
Total Bilirubin: 0.5 mg/dL (ref 0.3–1.2)
Total Protein: 7 g/dL (ref 6.5–8.1)

## 2022-10-15 LAB — CBC WITH DIFFERENTIAL (CANCER CENTER ONLY)
Abs Immature Granulocytes: 0.25 10*3/uL — ABNORMAL HIGH (ref 0.00–0.07)
Basophils Absolute: 0.1 10*3/uL (ref 0.0–0.1)
Basophils Relative: 0 %
Eosinophils Absolute: 0.1 10*3/uL (ref 0.0–0.5)
Eosinophils Relative: 1 %
HCT: 31.3 % — ABNORMAL LOW (ref 36.0–46.0)
Hemoglobin: 10.2 g/dL — ABNORMAL LOW (ref 12.0–15.0)
Immature Granulocytes: 2 %
Lymphocytes Relative: 13 %
Lymphs Abs: 1.5 10*3/uL (ref 0.7–4.0)
MCH: 33.2 pg (ref 26.0–34.0)
MCHC: 32.6 g/dL (ref 30.0–36.0)
MCV: 102 fL — ABNORMAL HIGH (ref 80.0–100.0)
Monocytes Absolute: 0.8 10*3/uL (ref 0.1–1.0)
Monocytes Relative: 7 %
Neutro Abs: 8.8 10*3/uL — ABNORMAL HIGH (ref 1.7–7.7)
Neutrophils Relative %: 77 %
Platelet Count: 212 10*3/uL (ref 150–400)
RBC: 3.07 MIL/uL — ABNORMAL LOW (ref 3.87–5.11)
RDW: 15 % (ref 11.5–15.5)
WBC Count: 11.4 10*3/uL — ABNORMAL HIGH (ref 4.0–10.5)
nRBC: 0 % (ref 0.0–0.2)

## 2022-10-15 LAB — IRON AND IRON BINDING CAPACITY (CC-WL,HP ONLY)
Iron: 104 ug/dL (ref 28–170)
Saturation Ratios: 39 % — ABNORMAL HIGH (ref 10.4–31.8)
TIBC: 266 ug/dL (ref 250–450)
UIBC: 162 ug/dL (ref 148–442)

## 2022-10-15 LAB — FERRITIN: Ferritin: 785 ng/mL — ABNORMAL HIGH (ref 11–307)

## 2022-10-15 LAB — PREALBUMIN: Prealbumin: 31 mg/dL (ref 18–38)

## 2022-10-15 MED ORDER — SODIUM CHLORIDE 0.9 % IV SOLN
1500.0000 mg | Freq: Once | INTRAVENOUS | Status: AC
  Administered 2022-10-15: 1500 mg via INTRAVENOUS
  Filled 2022-10-15: qty 30

## 2022-10-15 MED ORDER — PROCHLORPERAZINE MALEATE 10 MG PO TABS
10.0000 mg | ORAL_TABLET | Freq: Once | ORAL | Status: AC
  Administered 2022-10-15: 10 mg via ORAL
  Filled 2022-10-15: qty 1

## 2022-10-15 MED ORDER — SODIUM CHLORIDE 0.9 % IV SOLN: Freq: Once | INTRAVENOUS | Status: AC

## 2022-10-15 MED ORDER — SODIUM CHLORIDE 0.9% FLUSH
10.0000 mL | INTRAVENOUS | Status: DC | PRN
  Administered 2022-10-15: 10 mL

## 2022-10-15 MED ORDER — HEPARIN SOD (PORK) LOCK FLUSH 100 UNIT/ML IV SOLN
500.0000 [IU] | Freq: Once | INTRAVENOUS | Status: AC | PRN
  Administered 2022-10-15: 500 [IU]

## 2022-10-15 MED ORDER — SODIUM CHLORIDE 0.9 % IV SOLN: Freq: Once | INTRAVENOUS | Status: DC

## 2022-10-15 NOTE — Patient Instructions (Signed)
[facility]  Discharge Instructions: Thank you for choosing [facility] to provide your oncology and hematology care.   If you have a lab appointment with the Cancer Center, please go directly to the Cancer Center and check in at the registration area.  Wear comfortable clothing and clothing appropriate for easy access to any Portacath or PICC line.   We strive to give you quality time with your provider. You may need to reschedule your appointment if you arrive late (15 or more minutes).  Arriving late affects you and other patients whose appointments are after yours.  Also, if you miss three or more appointments without notifying the office, you may be dismissed from the clinic at the provider's discretion.      For prescription refill requests, have your pharmacy contact our office and allow 72 hours for refills to be completed.    Today you received the following chemotherapy and/or immunotherapy agents Imfinizi      To help prevent nausea and vomiting after your treatment, we encourage you to take your nausea medication as directed.  BELOW ARE SYMPTOMS THAT SHOULD BE REPORTED IMMEDIATELY: *FEVER GREATER THAN 100.4 F (38 C) OR HIGHER *CHILLS OR SWEATING *NAUSEA AND VOMITING THAT IS NOT CONTROLLED WITH YOUR NAUSEA MEDICATION *UNUSUAL SHORTNESS OF BREATH *UNUSUAL BRUISING OR BLEEDING *URINARY PROBLEMS (pain or burning when urinating, or frequent urination) *BOWEL PROBLEMS (unusual diarrhea, constipation, pain near the anus) TENDERNESS IN MOUTH AND THROAT WITH OR WITHOUT PRESENCE OF ULCERS (sore throat, sores in mouth, or a toothache) UNUSUAL RASH, SWELLING OR PAIN  UNUSUAL VAGINAL DISCHARGE OR ITCHING   Items with * indicate a potential emergency and should be followed up as soon as possible or go to the Emergency Department if any problems should occur.  Please show the CHEMOTHERAPY ALERT CARD or IMMUNOTHERAPY ALERT CARD at check-in  to the Emergency Department and triage nurse. Should you have questions after your visit or need to cancel or reschedule your appointment, please contact [place] [place]  [phone] and follow the prompts.  Office hours are 8:00 a.m. to 4:30 p.m. Monday - Friday. Please note that voicemails left after 4:00 p.m. may not be returned until the following business day.  We are closed weekends and major holidays. You have access to a nurse at all times for urgent questions. Please call the main number to the clinic [phone] and follow the prompts.  For any non-urgent questions, you may also contact your provider using MyChart. We now offer e-Visits for anyone 43 and older to request care online for non-urgent symptoms. For details visit [email or web address].   Also download the MyChart app! Go to the app store, search "MyChart", open the app, select [facility], and log in with your MyChart username and password.

## 2022-10-15 NOTE — Patient Instructions (Signed)

## 2022-10-15 NOTE — Progress Notes (Signed)
Hematology and Oncology Follow Up Visit  Angelica Benson [id number] February 13, 1929 89 y.o. 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  Her daughter cannot come with her because her daughter had back surgery and is still recovering.  Unfortunately, I think that her overall cognitive state continues to decline.  She is having more in the way of memory difficulties.  She really cannot live on her own now.  I know her family is trying to help her out.  We did do an MRI of the liver back in early June.  Thankfully, this did not show any evidence that her cancer had been progressing.  This is a little bit of a dilemma.  I am not sure how much chemotherapy is really helping her right now.  Again, her cognitive state continues to decline.  She seems to be weaker.  Of note, her last CA 19-9 was stable at 64.  She is not hurting.  She is having little bit of incontinence.  Again, I think that there is an element of dementia and that is starting to become an issue for her.  At this point, I really think that we can drop the gemcitabine.  I really do not think that we need to administer chemotherapy to her right now.  I think since her tumor is stable, we can use immunotherapy.  I think she would tolerate immunotherapy okay.  She seems to be eating okay.  There is no bleeding.  We have given her IV iron before.  Back in May, her iron saturation was only 9%.  Currently, I would have to say that her performance status is probably ECOG 2-3.    Wt Readings from Last 3 Encounters:  10/15/22 137 lb (62.1 kg)  09/23/22 138 lb 10.7 oz (62.9 kg)  09/13/22 136 lb (61.7 kg)      Medications:  Allergies as of  10/15/2022       Reactions   Buprenorphine Hcl Rash   Morphine And Codeine Rash        Medication List        Accurate as of October 15, 2022  1:21 PM. If you have any questions, ask your nurse or doctor.          Accu-Chek FastClix Lancets Misc Check blood sugar once to 2 times a day What changed: additional instructions   Accu-Chek Guide test strip Generic drug: glucose blood Check blood sugars 1-2 times daily   acetaminophen 500 MG tablet Commonly known as: TYLENOL Take 2 tablets (1,000 mg total) by mouth 2 (two) times daily as needed for moderate pain.   albuterol 108 (90 Base) MCG/ACT inhaler Commonly known as: VENTOLIN HFA USE 2 INHALATIONS BY MOUTH EVERY 6 HOURS AS NEEDED FOR WHEEZING  OR SHORTNESS OF BREATH   aspirin EC 81 MG tablet Take 81 mg by mouth daily.   atorvastatin 10 MG tablet Commonly known as: LIPITOR TAKE 1 TABLET BY MOUTH DAILY   betamethasone dipropionate 0.05 % cream APPLY TOPICALLY TO AFFECTED  AREA(S) TWICE DAILY   Centrum Silver 50+Women Tabs Take 1 tablet by mouth daily.   escitalopram 5 MG tablet Commonly known as: LEXAPRO TAKE 1 TABLET BY MOUTH DAILY   fluticasone 50 MCG/ACT  nasal spray Commonly known as: FLONASE USE 2 SPRAYS IN BOTH NOSTRILS  DAILY   lisinopril 10 MG tablet Commonly known as: ZESTRIL TAKE 1 TABLET BY MOUTH DAILY   megestrol 400 MG/10ML suspension Commonly known as: MEGACE Take 10 mLs (400 mg total) by mouth 2 (two) times daily. What changed: when to take this   metFORMIN 500 MG tablet Commonly known as: GLUCOPHAGE TAKE 1 TABLET BY MOUTH TWICE  DAILY WITH A MEAL   mometasone-formoterol 200-5 MCG/ACT Aero Commonly known as: DULERA Inhale 1 puff into the lungs 2 (two) times daily.   nitroGLYCERIN 0.4 MG SL tablet Commonly known as: NITROSTAT DISSOLVE 1 TABLET UNDER THE TONGUE EVERY 5 MINUTES AS  NEEDED FOR CHEST PAIN. MAX  OF 3 TABLETS IN 15 MINUTES. CALL 911 IF PAIN PERSISTS.   omeprazole 20 MG  capsule Commonly known as: PRILOSEC TAKE 1 CAPSULE BY MOUTH DAILY        Allergies:  Allergies  Allergen Reactions   Buprenorphine Hcl Rash   Morphine And Codeine Rash    Past Medical History, Surgical history, Social history, and Family History were reviewed and updated.  Review of Systems: Review of Systems  Constitutional: Negative.   HENT: Negative.    Eyes: Negative.   Respiratory: Negative.    Cardiovascular: Negative.   Gastrointestinal: Negative.   Genitourinary: Negative.   Musculoskeletal: Negative.   Skin: Negative.   Neurological: Negative.   Endo/Heme/Allergies: Negative.   Psychiatric/Behavioral: Negative.        Physical Exam:  height is 5\' 1"  (1.549 m) and weight is 137 lb (62.1 kg). Her oral temperature is 97.7 F (36.5 C). Her blood pressure is 124/55 (abnormal) and her pulse is 87. Her respiration is 16 and oxygen saturation is 100%.   Wt Readings from Last 3 Encounters:  10/15/22 137 lb (62.1 kg)  09/23/22 138 lb 10.7 oz (62.9 kg)  09/13/22 136 lb (61.7 kg)   Physical Exam Vitals reviewed.  HENT:     Head: Normocephalic and atraumatic.  Eyes:     Pupils: Pupils are equal, round, and reactive to light.  Cardiovascular:     Rate and Rhythm: Normal rate and regular rhythm.     Heart sounds: Normal heart sounds.  Pulmonary:     Effort: Pulmonary effort is normal.     Breath sounds: Normal breath sounds.  Abdominal:     General: Bowel sounds are normal.     Palpations: Abdomen is soft.  Musculoskeletal:        General: No tenderness or deformity. Normal range of motion.     Cervical back: Normal range of motion.  Lymphadenopathy:     Cervical: No cervical adenopathy.  Skin:    General: Skin is warm and dry.     Findings: No erythema or rash.  Neurological:     Mental Status: She is alert and oriented to person, place, and time.  Psychiatric:        Behavior: Behavior normal.        Thought Content: Thought content normal.         Judgment: Judgment normal.      Lab Results  Component Value Date   WBC 11.4 (H) 10/15/2022   HGB 10.2 (L) 10/15/2022   HCT 31.3 (L) 10/15/2022   MCV 102.0 (H) 10/15/2022   PLT 212 10/15/2022   Lab Results  Component Value Date   FERRITIN 163 09/06/2022   IRON 27 (L) 09/06/2022   TIBC 314 09/06/2022  UIBC 287 09/06/2022   IRONPCTSAT 9 (L) 09/06/2022   Lab Results  Component Value Date   RBC 3.07 (L) 10/15/2022   No results found for: "KPAFRELGTCHN", "LAMBDASER", "KAPLAMBRATIO" No results found for: "IGGSERUM", "IGA", "IGMSERUM" No results found for: "TOTALPROTELP", "ALBUMINELP", "A1GS", "A2GS", "BETS", "BETA2SER", "GAMS", "MSPIKE", "SPEI"   Chemistry      Component Value Date/Time   NA 135 09/23/2022 1524   K 3.6 09/23/2022 1524   CL 108 09/23/2022 1524   CO2 19 (L) 09/23/2022 1524   BUN 22 09/23/2022 1524   CREATININE 0.93 09/23/2022 1524   CREATININE 1.17 (H) 09/13/2022 1200   CREATININE 0.92 06/29/2021 1450      Component Value Date/Time   CALCIUM 8.2 (L) 09/23/2022 1524   ALKPHOS 44 09/23/2022 1524   AST 29 09/23/2022 1524   AST 19 09/13/2022 1200   ALT 25 09/23/2022 1524   ALT 19 09/13/2022 1200   BILITOT 0.8 09/23/2022 1524   BILITOT 0.3 09/13/2022 1200       Impression and Plan: Ms. 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   At this point, I would just go with Durvalumab as therapy.  This is only once a month.  I think we will certainly have the opportunity to see how she does with this.  Hopefully, stopping the chemotherapy will improve her mentation and her overall performance status.  I did speak with her daughter.  We will see how we can try to assist them with trying to find a nursing home or assisted living for Ms. Dalbert Garnet.  I would like to see Ms. Nosbisch back in 1 month.  Again, if she has come back sooner, it  really would not surprise me.   Josph Macho, MD 7/2/20241:21 PM

## 2022-10-16 ENCOUNTER — Inpatient Hospital Stay: Payer: 59 | Admitting: Licensed Clinical Social Worker

## 2022-10-16 LAB — CANCER ANTIGEN 19-9: CA 19-9: 87 U/mL — ABNORMAL HIGH (ref 0–35)

## 2022-10-16 NOTE — Progress Notes (Signed)
[facility] CSW Progress Note  Clinical Social Worker contacted caregiver by phone to assess needs per the request of Dr. Myna Hidalgo.  [name], [name], stated she will begin seeking nursing home placement for her mother due to increased cognitive decline.  Discussed the process and need for an FL2.  Patient's PCP is [name].  [name] said she is considering [facility] and will contact them.  CSW provided contact information for any future needs.    Dorothey Baseman, LCSW Clinical Social Worker [facility]    Patient is participating in a Managed Medicaid Plan:  Yes

## 2022-10-21 ENCOUNTER — Encounter: Payer: Self-pay | Admitting: *Deleted

## 2022-10-21 NOTE — Progress Notes (Signed)
Patient will transition to maintenance immunotherapy. She has not had navigational need for some time, so I will discontinue active navigation at this time but be available to the patient as needed in the future.  Oncology Nurse Navigator Documentation     10/21/2022   12:15 PM  Oncology Nurse Navigator Flowsheets  Navigation Complete Date: 10/21/2022  Post Navigation: Continue to Follow Patient? No  Reason Not Navigating Patient: Patient On Maintenance Chemotherapy

## 2022-10-22 ENCOUNTER — Other Ambulatory Visit: Payer: Self-pay | Admitting: Family

## 2022-11-03 ENCOUNTER — Other Ambulatory Visit: Payer: Self-pay | Admitting: Family

## 2022-11-05 ENCOUNTER — Ambulatory Visit (INDEPENDENT_AMBULATORY_CARE_PROVIDER_SITE_OTHER): Payer: 59 | Admitting: Family

## 2022-11-05 VITALS — BP 128/54 | HR 83 | Temp 98.6°F | Resp 16 | Wt 130.0 lb

## 2022-11-05 DIAGNOSIS — C259 Malignant neoplasm of pancreas, unspecified: Secondary | ICD-10-CM | POA: Diagnosis not present

## 2022-11-05 DIAGNOSIS — F0390 Unspecified dementia without behavioral disturbance: Secondary | ICD-10-CM

## 2022-11-05 DIAGNOSIS — C787 Secondary malignant neoplasm of liver and intrahepatic bile duct: Secondary | ICD-10-CM | POA: Diagnosis not present

## 2022-11-05 DIAGNOSIS — E119 Type 2 diabetes mellitus without complications: Secondary | ICD-10-CM | POA: Diagnosis not present

## 2022-11-05 NOTE — Assessment & Plan Note (Signed)
Deteriorated.  Will refer to social work to see if they can assist family with transitioning to ALF.  Daughter will send [place] a copy of her Advanced directives.

## 2022-11-05 NOTE — Assessment & Plan Note (Signed)
Continues to follow with Hematology. Chemo has been discontinued but she is continued on immunotherapy.

## 2022-11-05 NOTE — Assessment & Plan Note (Addendum)
Lab Results  Component Value Date   HGBA1C 6.4 04/17/2022  Continues to lose weight, expect A1C is lower now. Was at goal in January.   Wt Readings from Last 3 Encounters:  11/05/22 130 lb (59 kg)  10/15/22 137 lb (62.1 kg)  09/23/22 138 lb 10.7 oz (62.9 kg)

## 2022-11-05 NOTE — Progress Notes (Signed)
Subjective:     Patient ID: Angelica Benson, female    DOB: 1928/05/21, 89 y.o.   MRN: [id number]  Chief Complaint  Patient presents with   Dementia    Here for evaluation for 24 hours care    HPI  Discussed the use of AI scribe software for clinical note transcription with the patient, who gave verbal consent to proceed.  History of Present Illness   The patient, with a history of pancreatic cancer,  transient ischemic attack (TIA), hearing issues, anemia, neck and back pain, congestive heart failure (CHF), and hypothyroidism presents with her daughter to discuss severe memory loss.   The patient's TIA history may contribute to memory issues. The patient's hearing issues are not a primary concern unless brought up by the patient. The patient's anemia is monitored through CBC tests with hematology. The patient's CHF is regularly assessed to ensure euvolemia or volume overload. The patient's hypothyroidism was last checked in March with a normal TSH level.   Daughter notes that the patient is requiring 24 hour care.  This is expensive for the family.  They are interested in looking into an ALF facility.          Health Maintenance Due  Topic Date Due   DTaP/Tdap/Td (1 - Tdap) Never done   Medicare Annual Wellness (AWV)  05/16/2016   COVID-19 Vaccine (5 - 2023-24 season) 06/12/2022   OPHTHALMOLOGY EXAM  08/22/2022   HEMOGLOBIN A1C  10/16/2022    Past Medical History:  Diagnosis Date   Arthritis    CHF (congestive heart failure) (HCC)    Chronic asthma    Chronic headaches    Constipation    Diabetes mellitus without complication (HCC)    Goals of care, counseling/discussion 10/22/2021   Hemorrhoids without complication    Hypertension    Pancreatic cancer metastasized to liver (HCC) 10/22/2021    Past Surgical History:  Procedure Laterality Date   ABDOMINAL HYSTERECTOMY     BACK SURGERY     CHOLECYSTECTOMY     COLONOSCOPY  09/04/2001   Two rectal polyps. Small  internal hemorrhoids   ESOPHAGOGASTRODUODENOSCOPY  09/04/2001   Two apparent antral polyps with one possible gastroesophageal juction polyp versus enlarged fold or focal gastritis   IR IMAGING GUIDED PORT INSERTION  11/05/2021   KNEE SURGERY      Family History  Problem Relation Age of Onset   Diabetes Son    Hypertension Son    Cancer Sister        breast   Diabetes Daughter        plus 2 grandkids   Stroke Son        61mo age dx, has enlarged heart   Heart disease Son        passed away    Social History   Socioeconomic History   Marital status: Divorced    Spouse name: Not on file   Number of children: Not on file   Years of education: Not on file   Highest education level: Associate degree: occupational, Scientist, product/process development, or vocational program  Occupational History   Not on file  Tobacco Use   Smoking status: Never   Smokeless tobacco: Never  Vaping Use   Vaping status: Never Used  Substance and Sexual Activity   Alcohol use: No   Drug use: No   Sexual activity: Not Currently  Other Topics Concern   Not on file  Social History Narrative   Lives at home by herself,  aide in the am.     Has 4 daughters and 3 living sons (one son died).    Uses walker.     Divorced at age 8   Social Determinants of Health   Financial Resource Strain: Low Risk  (10/31/2022)   Overall Financial Resource Strain (CARDIA)    Difficulty of Paying Living Expenses: Not very hard  Food Insecurity: No Food Insecurity (10/31/2022)   Hunger Vital Sign    Worried About Running Out of Food in the Last Year: Never true    Ran Out of Food in the Last Year: Never true  Transportation Needs: No Transportation Needs (10/31/2022)   PRAPARE - Administrator, Civil Service (Medical): No    Lack of Transportation (Non-Medical): No  Physical Activity: Unknown (10/31/2022)   Exercise Vital Sign    Days of Exercise per Week: 0 days    Minutes of Exercise per Session: Not on file  Stress: No  Stress Concern Present (10/31/2022)   [employer] of Occupational Health - Occupational Stress Questionnaire    Feeling of Stress : Only a little  Social Connections: Moderately Integrated (10/31/2022)   Social Connection and Isolation Panel [NHANES]    Frequency of Communication with Friends and Family: More than three times a week    Frequency of Social Gatherings with Friends and Family: Once a week    Attends Religious Services: 1 to 4 times per year    Active Member of [employer] or Organizations: Yes    Attends Banker Meetings: 1 to 4 times per year    Marital Status: Divorced  Catering manager Violence: Not At Risk (11/08/2021)   Humiliation, Afraid, Rape, and Kick questionnaire    Fear of Current or Ex-Partner: No    Emotionally Abused: No    Physically Abused: No    Sexually Abused: No    Outpatient Medications Prior to Visit  Medication Sig Dispense Refill   Accu-Chek FastClix Lancets MISC Check blood sugar once to 2 times a day (Patient taking differently: Check blood sugar daily.) 204 each 2   acetaminophen (TYLENOL) 500 MG tablet Take 2 tablets (1,000 mg total) by mouth 2 (two) times daily as needed for moderate pain. 30 tablet 0   albuterol (VENTOLIN HFA) 108 (90 Base) MCG/ACT inhaler USE 2 INHALATIONS BY MOUTH EVERY 6 HOURS AS NEEDED FOR WHEEZING  OR SHORTNESS OF BREATH 34 g 2   aspirin EC 81 MG tablet Take 81 mg by mouth daily.     atorvastatin (LIPITOR) 10 MG tablet TAKE 1 TABLET BY MOUTH DAILY 100 tablet 2   betamethasone dipropionate 0.05 % cream APPLY TOPICALLY TO AFFECTED  AREA(S) TWICE DAILY 30 g 0   escitalopram (LEXAPRO) 5 MG tablet TAKE 1 TABLET BY MOUTH DAILY 100 tablet 1   glucose blood (ACCU-CHEK GUIDE) test strip Check blood sugars 1-2 times daily 200 strip 12   lisinopril (ZESTRIL) 10 MG tablet TAKE 1 TABLET BY MOUTH DAILY 100 tablet 2   megestrol (MEGACE) 400 MG/10ML suspension Take 10 mLs (400 mg total) by mouth 2 (two) times daily. (Patient  taking differently: Take 400 mg by mouth daily.) 480 mL 4   metFORMIN (GLUCOPHAGE) 500 MG tablet TAKE 1 TABLET BY MOUTH TWICE  DAILY WITH A MEAL 200 tablet 2   mometasone-formoterol (DULERA) 200-5 MCG/ACT AERO Inhale 1 puff into the lungs 2 (two) times daily. 39 g 1   Multiple Vitamins-Minerals (CENTRUM SILVER 50+WOMEN) TABS Take 1 tablet by mouth  daily.     nitroGLYCERIN (NITROSTAT) 0.4 MG SL tablet DISSOLVE 1 TABLET UNDER THE TONGUE EVERY 5 MINUTES AS  NEEDED FOR CHEST PAIN. MAX  OF 3 TABLETS IN 15 MINUTES. CALL 911 IF PAIN PERSISTS. 25 tablet 1   omeprazole (PRILOSEC) 20 MG capsule TAKE 1 CAPSULE BY MOUTH DAILY 100 capsule 2   fluticasone (FLONASE) 50 MCG/ACT nasal spray USE 2 SPRAYS IN BOTH NOSTRILS  DAILY 64 g 11   Facility-Administered Medications Prior to Visit  Medication Dose Route Frequency Provider Last Rate Last Admin   prochlorperazine (COMPAZINE) 10 MG tablet            sodium chloride flush (NS) 0.9 % injection 10 mL  10 mL Intravenous PRN Erenest Blank, NP   10 mL at 11/29/21 1244    Allergies  Allergen Reactions   Buprenorphine Hcl Rash   Morphine And Codeine Rash    ROS     Objective:    Physical Exam Constitutional:      General: She is not in acute distress.    Appearance: Normal appearance. She is well-developed.     Comments: Elderly female dozing off in wheelchair  HENT:     Head: Normocephalic and atraumatic.     Right Ear: External ear normal.     Left Ear: External ear normal.  Eyes:     General: No scleral icterus. Neck:     Thyroid: No thyromegaly.  Cardiovascular:     Rate and Rhythm: Normal rate and regular rhythm.     Heart sounds: Normal heart sounds. No murmur heard. Pulmonary:     Effort: Pulmonary effort is normal. No respiratory distress.     Breath sounds: Normal breath sounds. No wheezing.  Musculoskeletal:     Cervical back: Neck supple.  Skin:    General: Skin is warm and dry.  Neurological:     Mental Status: She is alert.  She is disoriented.  Psychiatric:        Mood and Affect: Mood normal.        Behavior: Behavior normal.        Thought Content: Thought content normal.        Judgment: Judgment normal.      BP (!) 128/54 (BP Location: Right Arm, Patient Position: Sitting, Cuff Size: Small)   Pulse 83   Temp 98.6 F (37 C) (Oral)   Resp 16   Wt 130 lb (59 kg)   SpO2 100%   BMI 24.56 kg/m  Wt Readings from Last 3 Encounters:  11/05/22 130 lb (59 kg)  10/15/22 137 lb (62.1 kg)  09/23/22 138 lb 10.7 oz (62.9 kg)       Assessment & Plan:   Problem List Items Addressed This Visit       Unprioritized   Pancreatic cancer metastasized to liver (HCC) (Chronic)    Continues to follow with Hematology. Chemo has been discontinued but she is continued on immunotherapy.       Type 2 diabetes mellitus (HCC)    Lab Results  Component Value Date   HGBA1C 6.4 04/17/2022  Continues to lose weight, expect A1C is lower now. Was at goal in January.   Wt Readings from Last 3 Encounters:  11/05/22 130 lb (59 kg)  10/15/22 137 lb (62.1 kg)  09/23/22 138 lb 10.7 oz (62.9 kg)         Dementia without behavioral disturbance, psychotic disturbance, mood disturbance, or anxiety (HCC) - Primary  Deteriorated.  Will refer to social work to see if they can assist family with transitioning to ALF.  Daughter will send [place] a copy of her Advanced directives.        Relevant Orders   AMB Referral to [facility] Coordinaton ([facility] Patients)   30 minutes spent on today's visit. Time was spent discussing pt's condition, goals of care and coordination for possible move to ALF.   I am having Dolly Rias maintain her aspirin EC, acetaminophen, Centrum Silver 50+Women, nitroGLYCERIN, mometasone-formoterol, Accu-Chek Guide, metFORMIN, atorvastatin, fluticasone, betamethasone dipropionate, lisinopril, escitalopram, Accu-Chek FastClix Lancets, megestrol, omeprazole, and albuterol.  No orders of the defined types  were placed in this encounter.

## 2022-11-06 ENCOUNTER — Telehealth: Payer: Self-pay | Admitting: *Deleted

## 2022-11-06 NOTE — Progress Notes (Signed)
  Care Coordination  Outreach Note  11/06/2022 Name: Angelica Benson MRN: [id number] DOB: April 21, 1928   Care Coordination Outreach Attempts: An unsuccessful telephone outreach was attempted today to offer the patient information about available care coordination services.  Follow Up Plan:  Additional outreach attempts will be made to offer the patient care coordination information and services.   Encounter Outcome:  No Answer  Burman Nieves, CCMA Care Coordination Care Guide Direct Dial: [phone]

## 2022-11-06 NOTE — Progress Notes (Signed)
  Care Coordination   Note   11/06/2022 Name: Angelica Benson MRN: [id number] DOB: [birth year]  Angelica Benson is a 89 y.o. year old female who sees [name], NP for primary care. I reached out to Dolly Rias by phone today to offer care coordination services.  [name] was given information about Care Coordination services today including:   The Care Coordination services include support from the care team which includes your Nurse Coordinator, Clinical Social Worker, or Pharmacist.  The Care Coordination team is here to help remove barriers to the health concerns and goals most important to you. Care Coordination services are voluntary, and the patient may decline or stop services at any time by request to their care team member.   Care Coordination Consent Status: Patient agreed to services and verbal consent obtained.   Follow up plan:  Telephone appointment with care coordination team member scheduled for:  11/07/2022  Encounter Outcome:  Pt. Scheduled from referral   Burman Nieves, [facility] Care Coordination Care Guide Direct Dial: [phone]

## 2022-11-07 ENCOUNTER — Ambulatory Visit: Payer: Self-pay | Admitting: Licensed Clinical Social Worker

## 2022-11-07 NOTE — Patient Instructions (Signed)
Social Work Visit Information  Thank you for taking time to visit with me today. Please don't hesitate to contact me if I can be of assistance to you.   Following are the goals we discussed today:   Goals Addressed             This Visit's Progress    Caregive support / Assistance with facility placement       Activities and task to complete in order to accomplish goals.  Daughter will assist Call or go to Department of Social Services to inform them you are working on long-term care placement Review facilities from the list provided call and go visit Review booklet ''When a loved one need long-term care" ( I have e-mailed it) Contact me once you decide on a facility  I will work with your PCP to get an FL2 and request the PASSAR # Please note we will need a copy of the advance directive          No follow up scheduled with social work at this time. Will follow up in 5 to 7 days .Patient will call office if needed prior to next encounter.  Please call the care guide team at [phone] if you need to cancel or reschedule your appointment.   If you or anyone you know are experiencing a Mental Health or Behavioral Health Crisis or need someone to talk to, please call the Suicide and Crisis Lifeline: 988 call the [place] National Suicide Prevention Lifeline: (250)758-6040 or TTY: 661-347-3661 TTY 716-523-6962) to talk to a trained counselor call 1-800-273-TALK (toll free, 24 hour hotline) go to [facility] [address] [place] [phone])   Patient verbalizes understanding of instructions and care plan provided today and agrees to view in MyChart. Active MyChart status and patient understanding of how to access instructions and care plan via MyChart confirmed with patient.       [name], LCSW Social Work Care Coordination  [name] [name] [phone]

## 2022-11-07 NOTE — Patient Outreach (Signed)
  Care Coordination  Initial Visit Note   11/07/2022 Name: Angelica Benson MRN: [id number] DOB: Apr 17, 1928  Angelica Benson is a 89 y.o. year old female who sees [name], NP for primary care. I spoke with  Angelica Benson's daughter Angelica Benson by phone today.  What matters to the patients health and wellness today?  Finding long-term care placement for patient  Per daughter family with assistance of her PCS aide and hired help at night are providing 24 hour care for patient. They can not continue providing this level of care ongoing.  Family believes patient can get better care in a facility with professionals that can meet her needs. LCSW will assist with placement process.   Goals Addressed             This Visit's Progress    Caregive support / Assistance with facility placement       Activities and task to complete in order to accomplish goals.  Daughter will assist Call or go to Department of Social Services to inform them you are working on long-term care placement Review facilities from the list provided call and go visit Review booklet ''When a loved one need long-term care" ( I have e-mailed it) Contact me once you decide on a facility  I will work with your PCP to get an FL2 and request the PASSAR # Please note we will need a copy of the advance directive         SDOH assessments and interventions completed:  Yes  SDOH Interventions Today    Flowsheet Row Most Recent Value  SDOH Interventions   Utilities Interventions Intervention Not Indicated       Care Coordination Interventions:  Yes, provided  Interventions Today    Flowsheet Row Most Recent Value  Chronic Disease   Chronic disease during today's visit Hypertension (HTN), Congestive Heart Failure (CHF), Diabetes  General Interventions   General Interventions Discussed/Reviewed General Interventions Discussed, Level of Care, Communication with  Communication with PCP/Specialists  Level of Care  Assisted Living, Skilled Nursing Facility, Personal Care Services  [reviewed placement process , levels of care and next steps]  Education Interventions   Education Provided Provided Education  [facility placment and list of facilites]  Provided Verbal Education On Dietitian Interventions   Safety Discussed/Reviewed Safety Discussed  Advanced Directive Interventions   Advanced Directives Discussed/Reviewed Advanced Directives Discussed  [per dau. She has documents and will provide a copy]       Follow up plan:  No f/u call scheduled will check in with daughter in 5 to 7 days    Encounter Outcome:  Pt. Visit Completed   [name], LCSW Social Work Care Coordination  [name] [name] [phone]

## 2022-11-10 ENCOUNTER — Encounter: Payer: Self-pay | Admitting: Family

## 2022-11-11 ENCOUNTER — Encounter: Payer: Self-pay | Admitting: Licensed Clinical Social Worker

## 2022-11-11 ENCOUNTER — Telehealth: Payer: Self-pay | Admitting: Family

## 2022-11-11 ENCOUNTER — Telehealth: Payer: Self-pay | Admitting: *Deleted

## 2022-11-11 ENCOUNTER — Other Ambulatory Visit: Payer: Self-pay | Admitting: *Deleted

## 2022-11-11 DIAGNOSIS — C259 Malignant neoplasm of pancreas, unspecified: Secondary | ICD-10-CM

## 2022-11-11 NOTE — Telephone Encounter (Signed)
Sherri from [facility] informed us that the pt has received a hospice referral and family would like to know if they can get the attending of record. If yes, she then asked does the provider want to defer comfort orders back to hospice. Please advise [facility]. Tele [phone].

## 2022-11-11 NOTE — Telephone Encounter (Signed)
Received a call from [facility], patients daughter, who stated that she is no longer able to get her mother into our office to see dr Myna Hidalgo and wanted to discuss Hospice.  Patient is incontinent and her dementia has increased dramatically in past few days.  Discussed option of hospice care and how it helps when you can't get patient into the office.  [name] wants to explore this possibility with a hospice  consult.  Ok per Dr Myna Hidalgo.  Hospice consult submitted electronically as well as verbally over the phone.

## 2022-11-11 NOTE — [place] FL2 (Signed)
[place] MEDICAID FL2 LEVEL OF CARE FORM     IDENTIFICATION  Patient Name: Angelica Benson Birthdate: 10-03-1928 Sex: female Admission Date (Current Location): [address]  [place] [address]   [place] Number:  Producer, television/film/video and Address:         Provider Number: [id number]  Attending Physician Name and Address:  [name], NP [address], [place], [place] [address] Relative Name and Phone Number:  Angelica Benson  [phone]    Current Level of Care: Home Recommended Level of Care: Skilled Nursing Facility Prior Approval Number:    Date Approved/Denied:   PASRR Number:    Discharge Plan:      Current Diagnoses: Patient Active Problem List   Diagnosis Date Noted   Dementia without behavioral disturbance, psychotic disturbance, mood disturbance, or anxiety (HCC) 11/05/2022   Pancreatic cancer metastasized to liver (HCC) 10/22/2021   Goals of care, counseling/discussion 10/22/2021   Weight loss 07/24/2021   Depression 07/24/2021   Osteopenia 07/24/2021   Hyperlipidemia 06/29/2021   Dizziness 10/02/2017   Liver mass 10/02/2017   Arthritis 02/19/2017   Hypertension    Diabetes mellitus without complication (HCC)    Chronic headaches    CHF (congestive heart failure) (HCC)    Asthma    Carpal tunnel syndrome 09/04/2015   Bilateral sensorineural hearing loss 06/07/2015   Cervical disc disorder with radiculopathy 06/07/2015   Cervical disc disorder with radiculopathy of cervical region    TIA (transient ischemic attack) 03/22/2015   Temporary cerebral vascular dysfunction 03/22/2015   Goiter 08/18/2013   Anemia, chronic disease 08/18/2013   Type 2 diabetes mellitus (HCC) 06/14/2013   Chronic diastolic heart failure (HCC) 04/05/2010   Benign essential hypertension 04/05/2010    Orientation RESPIRATION BLADDER Height & Weight     Self, Situation (Intermittently)  Normal Incontinent Weight:  130 lb Height:    5'1"  BEHAVIORAL SYMPTOMS/MOOD NEUROLOGICAL BOWEL NUTRITION STATUS      Incontinent Diet (heart healthy)  AMBULATORY STATUS COMMUNICATION OF NEEDS Skin   Extensive Assist (uses rolling walker, or wheelchair limited mobilty) Verbally Normal                       Personal Care Assistance Level of Assistance  Bathing, Feeding, Dressing Bathing Assistance: Maximum assistance Feeding assistance: Independent Dressing Assistance: Maximum assistance     Functional Limitations Info  Sight, Hearing, Speech Sight Info: Adequate (wears glasses) Hearing Info: Impaired (has hearing aids) Speech Info: Adequate    SPECIAL CARE FACTORS FREQUENCY                       Contractures Contractures Info: Not present    Additional Factors Info  Allergies   Allergies Info: Buprenorphine Hcl Rash  Morphine And Codeine  Rash           Current Medications (11/11/2022):  This is the current active medication list Current Outpatient Medications  Medication Sig Dispense Refill   Accu-Chek FastClix Lancets MISC Check blood sugar once to 2 times a day (Patient taking differently: Check blood sugar daily.) 204 each 2   acetaminophen (TYLENOL) 500 MG tablet Take 2 tablets (1,000 mg total) by mouth 2 (two) times daily as needed for moderate pain. 30 tablet 0   albuterol (VENTOLIN HFA) 108 (90 Base) MCG/ACT inhaler USE 2 INHALATIONS BY MOUTH EVERY 6 HOURS AS NEEDED FOR WHEEZING  OR SHORTNESS OF BREATH 34  g 2   aspirin EC 81 MG tablet Take 81 mg by mouth daily.     atorvastatin (LIPITOR) 10 MG tablet TAKE 1 TABLET BY MOUTH DAILY 100 tablet 2   betamethasone dipropionate 0.05 % cream APPLY TOPICALLY TO AFFECTED  AREA(S) TWICE DAILY 30 g 0   escitalopram (LEXAPRO) 5 MG tablet TAKE 1 TABLET BY MOUTH DAILY 100 tablet 1   fluticasone (FLONASE) 50 MCG/ACT nasal spray USE 2 SPRAYS IN BOTH NOSTRILS  DAILY 64 g 11   glucose blood (ACCU-CHEK GUIDE) test strip Check blood sugars 1-2 times daily 200 strip 12    lisinopril (ZESTRIL) 10 MG tablet TAKE 1 TABLET BY MOUTH DAILY 100 tablet 2   megestrol (MEGACE) 400 MG/10ML suspension Take 10 mLs (400 mg total) by mouth 2 (two) times daily. (Patient taking differently: Take 400 mg by mouth daily.) 480 mL 4   metFORMIN (GLUCOPHAGE) 500 MG tablet TAKE 1 TABLET BY MOUTH TWICE  DAILY WITH A MEAL 200 tablet 2   mometasone-formoterol (DULERA) 200-5 MCG/ACT AERO Inhale 1 puff into the lungs 2 (two) times daily. 39 g 1   Multiple Vitamins-Minerals (CENTRUM SILVER 50+WOMEN) TABS Take 1 tablet by mouth daily.     nitroGLYCERIN (NITROSTAT) 0.4 MG SL tablet DISSOLVE 1 TABLET UNDER THE TONGUE EVERY 5 MINUTES AS  NEEDED FOR CHEST PAIN. MAX  OF 3 TABLETS IN 15 MINUTES. CALL 911 IF PAIN PERSISTS. 25 tablet 1   omeprazole (PRILOSEC) 20 MG capsule TAKE 1 CAPSULE BY MOUTH DAILY 100 capsule 2   No current facility-administered medications for this visit.   Facility-Administered Medications Ordered in Other Visits  Medication Dose Route Frequency Provider Last Rate Last Admin   prochlorperazine (COMPAZINE) 10 MG tablet            sodium chloride flush (NS) 0.9 % injection 10 mL  10 mL Intravenous PRN Erenest Blank, NP   10 mL at 11/29/21 1244     Discharge Medications:   Relevant Imaging Results:  Relevant Lab Results:   Additional Information    Soundra Pilon, LCSW

## 2022-11-12 ENCOUNTER — Inpatient Hospital Stay: Payer: 59 | Admitting: Hematology & Oncology

## 2022-11-12 ENCOUNTER — Inpatient Hospital Stay: Payer: 59

## 2022-11-12 NOTE — Patient Outreach (Signed)
  Care Coordination   Collaboration  Visit Note   11/12/2022 Name: Angelica Benson MRN: [id number] DOB: [birth year]  Angelica Benson is a 89 y.o. year old female who sees [name], NP for primary care. I  collaborated during this encounter   What matters to the patients health and wellness today?    Patient was not interviewed or contacted during this encounter Conducted brief assessment, recommendations and relevant information discussed.   LCSW  collaborated with PCP, [name], and patient's daughter  to assist with meeting patient's needs.   LCSW informed by PCP patient is now being evaluated for hospice services. Will f/u with family after hospice assessment to assess for ongoing needs and support   Goals Addressed             This Visit's Progress    Caregive support       Activities and task to complete in order to accomplish goals.  Daughter will assist Call or go to Department of Social Services to inform them you are working on long-term care placement Review facilities from the list provided call and go visit Review booklet ''When a loved one need long-term care" ( I have e-mailed it) Contact me once you decide on a facility  I will work with your PCP to get an FL2 Please note we will need a copy of the advance directive         SDOH assessments and interventions completed:  No   Care Coordination Interventions:  Yes, provided  Interventions Today    Flowsheet Row Most Recent Value  Chronic Disease   Chronic disease during today's visit Hypertension (HTN), Congestive Heart Failure (CHF), Diabetes  General Interventions   General Interventions Discussed/Reviewed Communication with, Level of Care  Communication with PCP/Specialists  [facility admin,  CMA]  Level of Care Skilled Nursing Facility  Applications FL-2  Education Interventions   Education Provided Provided Education  [barriers with placement and next steps]  Provided Verbal Education On  Public librarian FL-2  Advanced Directive Interventions   Advanced Directives Discussed/Reviewed Advanced Directives Discussed       Follow up plan:  No f/u scheduled will contact family in 3 to 7 days    Encounter Outcome:  Pt. Visit Completed   [name], LCSW Social Work Care Coordination  [employer] [name] [name] [phone]

## 2022-11-12 NOTE — Telephone Encounter (Signed)
OK to place me as the provider of record. I will defer comfort measures back to Hospice.   Tona Sensing. tks

## 2022-11-12 NOTE — Telephone Encounter (Signed)
Information given to [facility] for her to be aware Melissa O. Ok to be attending and differed comfort orders to hospice.

## 2022-11-12 NOTE — Patient Instructions (Signed)
Social Work Visit Information  Patient was not contacted during this encounter.  LCSW collaborated with care team to accomplish patient's care plan goal   Following are the goals we are working on:   Goals Addressed             This Visit's Progress    Caregive support       Activities and task to complete in order to accomplish goals.  Daughter will assist Call or go to Department of Social Services to inform them you are working on long-term care placement Review facilities from the list provided call and go visit Review booklet ''When a loved one need long-term care" ( I have e-mailed it) Contact me once you decide on a facility  I will work with your PCP to get an FL2 Please note we will need a copy of the advance directive         [name], LCSW Social Work Care Coordination  [name] [name] [phone]

## 2022-11-13 ENCOUNTER — Ambulatory Visit: Payer: Self-pay | Admitting: Licensed Clinical Social Worker

## 2022-11-13 NOTE — Patient Instructions (Signed)
Social Work Visit Information  Thank you for taking time to visit with me today. Please don't hesitate to contact me if I can be of assistance to you.   Following are the goals we discussed today:   Goals Addressed             This Visit's Progress    COMPLETED: Caregive support       Activities and task to complete in order to accomplish goals.  Daughter will assist I have spoken with [name] , LCSW at Hospice, she has informed me that she will take over the placement process Completed a new FL2 and get Hospice provider Dr. [name] to sign Request the PASSAR Collaborate with DSS for long-term care medicaid         No follow up scheduled with social work at this time. Patient will call office if needed.   Please call the care guide team at [phone] if you need to cancel or reschedule your appointment.   If you or anyone you know are experiencing a Mental Health or Behavioral Health Crisis or need someone to talk to, please call the Suicide and Crisis Lifeline: 988 call the [place] National Suicide Prevention Lifeline: (747) 439-9305 or TTY: 515-411-3278 TTY 470-100-7729) to talk to a trained counselor call 1-800-273-TALK (toll free, 24 hour hotline) go to [facility] [address], [place] [id number])   Patient 's daughter verbalizes understanding of instructions and care plan provided today and agrees to view in MyChart. Active MyChart status and patient understanding of how to access instructions and care plan via MyChart confirmed with patient.       [name], LCSW Social Work Care Coordination  [name] [name] [phone]

## 2022-11-13 NOTE — Patient Outreach (Signed)
  Care Coordination  Follow Up Visit Note   11/13/2022 Name: Angelica Benson MRN: [id number] DOB: 01/18/1929  [name] is a 89 y.o. year old female who sees [name], NP for primary care. I spoke with  [name] Betty's daughter [name] by phone today.  What matters to the patients health and wellness today?    [name] informed LCSW that the Hospice Social worker would do a home visit today. LCSW collaborated with Physicist, medical at [employer]. She completed assessment for patient and will take the lead on placement at [facility].     Goals Addressed             This Visit's Progress    COMPLETED: Caregive support       Activities and task to complete in order to accomplish goals.  Daughter will assist I have spoken with [name] , LCSW at Hospice, she has informed me that she will take over the placement process Completed a new FL2 and get Hospice provider Dr. [name] to sign Request the PASSAR Collaborate with DSS for long-term care medicaid        SDOH assessments and interventions completed:  No  Care Coordination Interventions:  Yes, provided  Interventions Today    Flowsheet Row Most Recent Value  Chronic Disease   Chronic disease during today's visit Hypertension (HTN), Congestive Heart Failure (CHF), Diabetes  General Interventions   General Interventions Discussed/Reviewed General Interventions Reviewed, Level of Care, Communication with  Communication with PCP/Specialists, Social Work  [employer] social worker]  Level of Care Skilled Nursing Facility  Education Interventions   Education Provided Provided Education  [placement process]       Follow up plan: No further intervention required by LCSW at this time.  Hospice Social worker will consult if needed.  Encounter Outcome:  Pt. Visit Completed   [name], LCSW Social Work Care Coordination  [name] [name] [phone]

## 2022-11-20 ENCOUNTER — Other Ambulatory Visit: Payer: Self-pay | Admitting: Family

## 2022-11-20 DIAGNOSIS — F32A Depression, unspecified: Secondary | ICD-10-CM

## 2022-12-09 DIAGNOSIS — E785 Hyperlipidemia, unspecified: Secondary | ICD-10-CM | POA: Diagnosis not present

## 2022-12-09 DIAGNOSIS — C253 Malignant neoplasm of pancreatic duct: Secondary | ICD-10-CM | POA: Diagnosis not present

## 2022-12-09 DIAGNOSIS — R627 Adult failure to thrive: Secondary | ICD-10-CM | POA: Diagnosis not present

## 2022-12-09 DIAGNOSIS — M6281 Muscle weakness (generalized): Secondary | ICD-10-CM | POA: Diagnosis not present

## 2022-12-10 DIAGNOSIS — J441 Chronic obstructive pulmonary disease with (acute) exacerbation: Secondary | ICD-10-CM | POA: Diagnosis not present

## 2022-12-10 DIAGNOSIS — C253 Malignant neoplasm of pancreatic duct: Secondary | ICD-10-CM | POA: Diagnosis not present

## 2022-12-10 DIAGNOSIS — R627 Adult failure to thrive: Secondary | ICD-10-CM | POA: Diagnosis not present

## 2022-12-13 DIAGNOSIS — C253 Malignant neoplasm of pancreatic duct: Secondary | ICD-10-CM | POA: Diagnosis not present

## 2022-12-13 DIAGNOSIS — J441 Chronic obstructive pulmonary disease with (acute) exacerbation: Secondary | ICD-10-CM | POA: Diagnosis not present

## 2022-12-13 DIAGNOSIS — E119 Type 2 diabetes mellitus without complications: Secondary | ICD-10-CM | POA: Diagnosis not present

## 2022-12-16 DIAGNOSIS — C253 Malignant neoplasm of pancreatic duct: Secondary | ICD-10-CM | POA: Diagnosis not present

## 2022-12-16 DIAGNOSIS — F5101 Primary insomnia: Secondary | ICD-10-CM | POA: Diagnosis not present

## 2022-12-16 DIAGNOSIS — R627 Adult failure to thrive: Secondary | ICD-10-CM | POA: Diagnosis not present

## 2022-12-16 DIAGNOSIS — E1165 Type 2 diabetes mellitus with hyperglycemia: Secondary | ICD-10-CM | POA: Diagnosis not present

## 2022-12-17 ENCOUNTER — Ambulatory Visit: Payer: 59 | Admitting: Podiatry

## 2022-12-20 DIAGNOSIS — M6281 Muscle weakness (generalized): Secondary | ICD-10-CM | POA: Diagnosis not present

## 2022-12-20 DIAGNOSIS — W06XXXA Fall from bed, initial encounter: Secondary | ICD-10-CM | POA: Diagnosis not present

## 2022-12-26 DIAGNOSIS — M25561 Pain in right knee: Secondary | ICD-10-CM | POA: Diagnosis not present

## 2023-01-14 DIAGNOSIS — R627 Adult failure to thrive: Secondary | ICD-10-CM | POA: Diagnosis not present

## 2023-01-14 DIAGNOSIS — E1165 Type 2 diabetes mellitus with hyperglycemia: Secondary | ICD-10-CM | POA: Diagnosis not present

## 2023-01-14 DIAGNOSIS — F5101 Primary insomnia: Secondary | ICD-10-CM | POA: Diagnosis not present

## 2023-01-14 DIAGNOSIS — C253 Malignant neoplasm of pancreatic duct: Secondary | ICD-10-CM | POA: Diagnosis not present

## 2023-01-18 ENCOUNTER — Emergency Department (HOSPITAL_COMMUNITY)

## 2023-01-18 ENCOUNTER — Emergency Department (HOSPITAL_COMMUNITY)
Admission: EM | Admit: 2023-01-18 | Discharge: 2023-01-19 | Disposition: A | Discharge status: Home / Self Care | Attending: Emergency Medicine | Admitting: Emergency Medicine

## 2023-01-18 ENCOUNTER — Other Ambulatory Visit: Payer: Self-pay

## 2023-01-18 DIAGNOSIS — Z20822 Contact with and (suspected) exposure to covid-19: Secondary | ICD-10-CM | POA: Diagnosis not present

## 2023-01-18 DIAGNOSIS — J42 Unspecified chronic bronchitis: Secondary | ICD-10-CM | POA: Diagnosis not present

## 2023-01-18 DIAGNOSIS — I509 Heart failure, unspecified: Secondary | ICD-10-CM | POA: Insufficient documentation

## 2023-01-18 DIAGNOSIS — Z8507 Personal history of malignant neoplasm of pancreas: Secondary | ICD-10-CM | POA: Insufficient documentation

## 2023-01-18 DIAGNOSIS — M4803 Spinal stenosis, cervicothoracic region: Secondary | ICD-10-CM | POA: Diagnosis not present

## 2023-01-18 DIAGNOSIS — E119 Type 2 diabetes mellitus without complications: Secondary | ICD-10-CM | POA: Insufficient documentation

## 2023-01-18 DIAGNOSIS — Z042 Encounter for examination and observation following work accident: Secondary | ICD-10-CM | POA: Diagnosis not present

## 2023-01-18 DIAGNOSIS — W19XXXA Unspecified fall, initial encounter: Secondary | ICD-10-CM | POA: Insufficient documentation

## 2023-01-18 DIAGNOSIS — F0390 Unspecified dementia without behavioral disturbance: Secondary | ICD-10-CM | POA: Diagnosis not present

## 2023-01-18 DIAGNOSIS — I700 Atherosclerosis of aorta: Secondary | ICD-10-CM | POA: Diagnosis not present

## 2023-01-18 DIAGNOSIS — Z043 Encounter for examination and observation following other accident: Secondary | ICD-10-CM | POA: Insufficient documentation

## 2023-01-18 DIAGNOSIS — R9082 White matter disease, unspecified: Secondary | ICD-10-CM | POA: Diagnosis not present

## 2023-01-18 DIAGNOSIS — G309 Alzheimer's disease, unspecified: Secondary | ICD-10-CM | POA: Diagnosis not present

## 2023-01-18 DIAGNOSIS — I6529 Occlusion and stenosis of unspecified carotid artery: Secondary | ICD-10-CM | POA: Diagnosis not present

## 2023-01-18 DIAGNOSIS — M542 Cervicalgia: Secondary | ICD-10-CM | POA: Diagnosis not present

## 2023-01-18 DIAGNOSIS — M4802 Spinal stenosis, cervical region: Secondary | ICD-10-CM | POA: Diagnosis not present

## 2023-01-18 DIAGNOSIS — Z743 Need for continuous supervision: Secondary | ICD-10-CM | POA: Diagnosis not present

## 2023-01-18 DIAGNOSIS — I10 Essential (primary) hypertension: Secondary | ICD-10-CM | POA: Diagnosis not present

## 2023-01-18 LAB — URINALYSIS, W/ REFLEX TO CULTURE (INFECTION SUSPECTED)
Bacteria, UA: NONE SEEN
Bilirubin Urine: NEGATIVE
Glucose, UA: NEGATIVE mg/dL
Hgb urine dipstick: NEGATIVE
Ketones, ur: NEGATIVE mg/dL
Leukocytes,Ua: NEGATIVE
Nitrite: NEGATIVE
Protein, ur: NEGATIVE mg/dL
Specific Gravity, Urine: 1.024 (ref 1.005–1.030)
pH: 5 (ref 5.0–8.0)

## 2023-01-18 LAB — CBC WITH DIFFERENTIAL/PLATELET
Abs Immature Granulocytes: 0.41 10*3/uL — ABNORMAL HIGH (ref 0.00–0.07)
Basophils Absolute: 0.1 10*3/uL (ref 0.0–0.1)
Basophils Relative: 1 %
Eosinophils Absolute: 0 10*3/uL (ref 0.0–0.5)
Eosinophils Relative: 0 %
HCT: 40.1 % (ref 36.0–46.0)
Hemoglobin: 12.9 g/dL (ref 12.0–15.0)
Immature Granulocytes: 4 %
Lymphocytes Relative: 16 %
Lymphs Abs: 1.7 10*3/uL (ref 0.7–4.0)
MCH: 32.9 pg (ref 26.0–34.0)
MCHC: 32.2 g/dL (ref 30.0–36.0)
MCV: 102.3 fL — ABNORMAL HIGH (ref 80.0–100.0)
Monocytes Absolute: 0.5 10*3/uL (ref 0.1–1.0)
Monocytes Relative: 5 %
Neutro Abs: 8 10*3/uL — ABNORMAL HIGH (ref 1.7–7.7)
Neutrophils Relative %: 74 %
Platelets: 264 10*3/uL (ref 150–400)
RBC: 3.92 MIL/uL (ref 3.87–5.11)
RDW: 13.8 % (ref 11.5–15.5)
WBC: 10.8 10*3/uL — ABNORMAL HIGH (ref 4.0–10.5)
nRBC: 0 % (ref 0.0–0.2)

## 2023-01-18 LAB — SARS CORONAVIRUS 2 BY RT PCR: SARS Coronavirus 2 by RT PCR: NEGATIVE

## 2023-01-18 LAB — BASIC METABOLIC PANEL
Anion gap: 12 (ref 5–15)
BUN: 41 mg/dL — ABNORMAL HIGH (ref 8–23)
CO2: 21 mmol/L — ABNORMAL LOW (ref 22–32)
Calcium: 9.6 mg/dL (ref 8.9–10.3)
Chloride: 103 mmol/L (ref 98–111)
Creatinine, Ser: 1.24 mg/dL — ABNORMAL HIGH (ref 0.44–1.00)
GFR, Estimated: 41 mL/min — ABNORMAL LOW (ref >60)
Glucose, Bld: 199 mg/dL — ABNORMAL HIGH (ref 70–99)
Potassium: 4.3 mmol/L (ref 3.5–5.1)
Sodium: 136 mmol/L (ref 135–145)

## 2023-01-18 LAB — COMPREHENSIVE METABOLIC PANEL
ALT: 15 U/L (ref 0–44)
AST: 33 U/L (ref 15–41)
Albumin: 3.6 g/dL (ref 3.5–5.0)
Alkaline Phosphatase: 60 U/L (ref 38–126)
Anion gap: 14 (ref 5–15)
BUN: 38 mg/dL — ABNORMAL HIGH (ref 8–23)
CO2: 18 mmol/L — ABNORMAL LOW (ref 22–32)
Calcium: 9.2 mg/dL (ref 8.9–10.3)
Chloride: 102 mmol/L (ref 98–111)
Creatinine, Ser: 1.16 mg/dL — ABNORMAL HIGH (ref 0.44–1.00)
GFR, Estimated: 44 mL/min — ABNORMAL LOW (ref >60)
Glucose, Bld: 187 mg/dL — ABNORMAL HIGH (ref 70–99)
Potassium: 5.4 mmol/L — ABNORMAL HIGH (ref 3.5–5.1)
Sodium: 134 mmol/L — ABNORMAL LOW (ref 135–145)
Total Bilirubin: 1 mg/dL (ref 0.3–1.2)
Total Protein: 7.2 g/dL (ref 6.5–8.1)

## 2023-01-18 NOTE — ED Triage Notes (Signed)
Pt BIBA from [facility]. Pt had unwitnessed fall from bed to floor. Adv dementia and can not answer questions. Pt does not seem to have any places where they withdraw d/t pain

## 2023-01-18 NOTE — ED Notes (Signed)
Pt transported to Xray and CT. 

## 2023-01-18 NOTE — ED Provider Notes (Signed)
[facility] EMERGENCY DEPARTMENT AT [facility] Provider Note   CSN: [id number] Arrival date & time: 01/18/23  1702     History  Chief Complaint  Patient presents with   Angelica Benson    Angelica Benson is a 89 y.o. female history of pancreatic cancer with metastasis to the liver, DNR, hospice, TIA, CHF, diabetes presented after a fall that occurred this morning.  Patient does live in a SNF however this fall was unwitnessed.  Daughter is present at bedside who does have medical decision making power for the patient and states that her and the nursing facility have not noticed any bruises on her but that patient has been shaking since then.  Daughter does note that patient is at mental baseline and does not endorse any altered mental status.  Daughter states that she would like to have labs and imaging drawn along with a urine to further assess patient.  Patient unable to provide history due to Alzheimer's  Home Medications Prior to Admission medications   Medication Sig Start Date End Date Taking? Authorizing Provider  acetaminophen (TYLENOL) 500 MG tablet Take 2 tablets (1,000 mg total) by mouth 2 (two) times daily as needed for moderate pain. Patient taking differently: Take 1,000 mg by mouth See admin instructions. Take 1,000 mg by mouth at 10 AM and 6 PM 08/15/17  Yes [name], NP  albuterol (PROVENTIL) (2.5 MG/3ML) 0.083% nebulizer solution Take 2.5 mg by nebulization every 6 (six) hours as needed for wheezing or shortness of breath.   Yes [provider]  aspirin 81 MG chewable tablet Chew 81 mg by mouth daily.   Yes [provider]  atorvastatin (LIPITOR) 10 MG tablet TAKE 1 TABLET BY MOUTH DAILY Patient taking differently: Take 10 mg by mouth daily. 04/07/22  Yes [name], NP  escitalopram (LEXAPRO) 5 MG tablet TAKE 1 TABLET BY MOUTH DAILY Patient taking differently: Take 5 mg by mouth in the morning. 11/20/22  Yes [name], NP   fluticasone (FLONASE) 50 MCG/ACT nasal spray USE 2 SPRAYS IN BOTH NOSTRILS  DAILY Patient taking differently: Place 2 sprays into both nostrils in the morning. 04/07/22 01/18/23 Yes [name], NP  lisinopril (ZESTRIL) 10 MG tablet TAKE 1 TABLET BY MOUTH DAILY Patient taking differently: Take 10 mg by mouth in the morning. 06/09/22  Yes [name], NP  megestrol (MEGACE) 400 MG/10ML suspension Take 10 mLs (400 mg total) by mouth 2 (two) times daily. 07/19/22  Yes Ennever, Rose Phi, MD  metFORMIN (GLUCOPHAGE) 500 MG tablet TAKE 1 TABLET BY MOUTH TWICE  DAILY WITH A MEAL Patient taking differently: Take 500 mg by mouth 2 (two) times daily with a meal. 04/07/22  Yes [name], NP  Multiple Vitamins-Minerals (CENTRUM SILVER 50+WOMEN) TABS Take 1 tablet by mouth daily. 04/28/18  Yes [name], NP  nitroGLYCERIN (NITROSTAT) 0.4 MG SL tablet DISSOLVE 1 TABLET UNDER THE TONGUE EVERY 5 MINUTES AS  NEEDED FOR CHEST PAIN. MAX  OF 3 TABLETS IN 15 MINUTES. CALL 911 IF PAIN PERSISTS. Patient taking differently: Place 0.4 mg under the tongue every 5 (five) minutes x 3 doses as needed for chest pain (CALL MD, IF NO RELIEF). 07/24/21  Yes [name], NP  omeprazole (PRILOSEC) 20 MG capsule TAKE 1 CAPSULE BY MOUTH DAILY Patient taking differently: Take 20 mg by mouth in the morning. 10/23/22  Yes [name], NP  QUEtiapine (SEROQUEL) 25 MG tablet Take 25 mg by mouth at bedtime.   Yes [provider]  Accu-Chek FastClix Lancets MISC Check blood sugar once to 2 times a day Patient taking differently: Check blood sugar daily. 06/19/22   [name], NP  albuterol (VENTOLIN HFA) 108 (90 Base) MCG/ACT inhaler USE 2 INHALATIONS BY MOUTH EVERY 6 HOURS AS NEEDED FOR WHEEZING  OR SHORTNESS OF BREATH Patient not taking: Reported on 01/18/2023 11/04/22   [name], NP  betamethasone dipropionate 0.05 % cream APPLY TOPICALLY TO AFFECTED  AREA(S) TWICE  DAILY Patient not taking: Reported on 01/18/2023 04/07/22   [name], NP  glucose blood (ACCU-CHEK GUIDE) test strip Check blood sugars 1-2 times daily 01/22/22   [name], NP  mometasone-formoterol (DULERA) 200-5 MCG/ACT AERO Inhale 1 puff into the lungs 2 (two) times daily. Patient not taking: Reported on 01/18/2023 12/18/21   [name], NP  prochlorperazine (COMPAZINE) 10 MG tablet Take 1 tablet (10 mg total) by mouth every 6 (six) hours as needed (Nausea or vomiting). Patient not taking: Reported on 11/22/2021 10/29/21 11/29/21  Josph Macho, MD      Allergies    Buprenorphine hcl and Morphine and codeine    Review of Systems   Review of Systems  Physical Exam Updated Vital Signs BP (!) 170/63   Pulse 96   Temp 97.7 F (36.5 C) (Axillary)   Resp (!) 21   Ht 5\' 1"  (1.549 m)   Wt 59 kg   SpO2 93%   BMI 24.56 kg/m  Physical Exam Constitutional:      Comments: At mental baseline Unable to answer questions Cachectic appearing  Eyes:     Extraocular Movements: Extraocular movements intact.     Conjunctiva/sclera: Conjunctivae normal.     Pupils: Pupils are equal, round, and reactive to light.  Cardiovascular:     Rate and Rhythm: Normal rate and regular rhythm.     Pulses: Normal pulses.     Heart sounds: Normal heart sounds.  Pulmonary:     Effort: Pulmonary effort is normal. No respiratory distress.     Breath sounds: Normal breath sounds.  Abdominal:     General: There is no distension.     Palpations: Abdomen is soft.     Tenderness: There is no abdominal tenderness. There is no guarding or rebound.  Musculoskeletal:     Comments: No tremors or shaking noted Patient does appear weak throughout however this is baseline No bony abnormalities or tenderness noted on exam in upper/lower extremities, spine, ribs  Skin:    General: Skin is warm and dry.     Comments: No signs of trauma  Neurological:     Comments: Unable to perform as  patient is unable to follow commands     ED Results / Procedures / Treatments   Labs (all labs ordered are listed, but only abnormal results are displayed) Labs Reviewed  CBC WITH DIFFERENTIAL/PLATELET - Abnormal; Notable for the following components:      Result Value   WBC 10.8 (*)    MCV 102.3 (*)    Neutro Abs 8.0 (*)    Abs Immature Granulocytes 0.41 (*)    All other components within normal limits  COMPREHENSIVE METABOLIC PANEL - Abnormal; Notable for the following components:   Sodium 134 (*)    Potassium 5.4 (*)    CO2 18 (*)    Glucose, Bld 187 (*)    BUN 38 (*)    Creatinine, Ser 1.16 (*)    GFR, Estimated 44 (*)    All other components  within normal limits  BASIC METABOLIC PANEL - Abnormal; Notable for the following components:   CO2 21 (*)    Glucose, Bld 199 (*)    BUN 41 (*)    Creatinine, Ser 1.24 (*)    GFR, Estimated 41 (*)    All other components within normal limits  SARS CORONAVIRUS 2 BY RT PCR  URINALYSIS, W/ REFLEX TO CULTURE (INFECTION SUSPECTED)    EKG EKG Interpretation Date/Time:  Saturday January 18 2023 19:57:39 EDT Ventricular Rate:  87 PR Interval:  165 QRS Duration:  130 QT Interval:  373 QTC Calculation: 449 R Axis:   -83  Text Interpretation: Sinus rhythm RBBB and LAFB ST elevation, consider inferior injury No significant change since last tracing Confirmed by Alvira Monday ([id number]) on 01/18/2023 9:26:30 PM  Radiology CT Head Wo Contrast  Result Date: 01/18/2023 CLINICAL DATA:  Unwitnessed fall, chronic dementia patient unable to answer questions. EXAM: CT HEAD WITHOUT CONTRAST CT CERVICAL SPINE WITHOUT CONTRAST TECHNIQUE: Multidetector CT imaging of the head and cervical spine was performed following the standard protocol without intravenous contrast. Multiplanar CT image reconstructions of the cervical spine were also generated. RADIATION DOSE REDUCTION: This exam was performed according to the departmental dose-optimization  program which includes automated exposure control, adjustment of the mA and/or kV according to patient size and/or use of iterative reconstruction technique. COMPARISON:  Head CT 06/12/2020, MRI cervical spine 10/20/2007. FINDINGS: CT HEAD FINDINGS Brain: There is no new asymmetry concerning for acute cortical based infarct, hemorrhage, mass or mass effect. There is no midline shift. There is progressive moderately advanced global atrophy with atrophic ventriculomegaly and progressive severe chronic small-vessel disease of the cerebral white matter. There are no old territorial or visible lacunar infarcts. Basal cisterns are clear. Vascular: There are patchy calcifications in both siphons. No hyperdense central vessel is seen. Skull: Negative for fractures or focal lesions. No visible scalp hematoma. Sinuses/Orbits: Mild membrane thickening noted left sphenoid air cell. Otherwise clear paranasal sinuses, mastoid air cells, middle ear cavities. Midline nasal septum. Negative orbits. Other: None. CT CERVICAL SPINE FINDINGS Alignment: Physiologic apart from bone-on-bone anterior atlantodental joint space loss, with osteophytes. No listhesis or scoliosis. Skull base and vertebrae: Generalized osteopenia. No acute fracture is evident. No primary bone lesion or focal pathologic process. Soft tissues and spinal canal: No prevertebral fluid or swelling. No visible canal hematoma. There is calcification of the left-greater-than-right carotid bifurcations. No thyroid or laryngeal mass. Partial visualization noted right IJ port catheter. Disc levels: There is advanced diffuse chronic degenerative disc collapse compared with 2009, except at C2-3 which is still normal in height. There are prominent bidirectional osteophytes C3-4 through C7-T1 inclusive. Posterior disc osteophyte complexes are associated with spondylotic cord compression at C3-4, on the left-greater-than-right at C5-6, and at C6-7. There is  left-greater-than-right facet joint hypertrophy and multilevel uncinate spurring and acquired foraminal stenosis, most significantly on the right at C3-4 and on the left at C4-5, C5-6, and bilaterally at C6-7 and C7-T1. Upper chest: Aortic Atherosclerosis.  Otherwise negative. Other: None. IMPRESSION: 1. No acute intracranial CT findings or depressed skull fractures. 2. Progressive global atrophy and advanced chronic small-vessel disease. 3. Osteopenia and degenerative change without evidence of cervical fractures. 4. Aortic and carotid atherosclerosis. Aortic Atherosclerosis (ICD10-I70.0). Electronically Signed   By: Almira Bar M.D.   On: 01/18/2023 20:46   CT Cervical Spine Wo Contrast  Result Date: 01/18/2023 CLINICAL DATA:  Unwitnessed fall, chronic dementia patient unable to answer questions. EXAM: CT HEAD WITHOUT  CONTRAST CT CERVICAL SPINE WITHOUT CONTRAST TECHNIQUE: Multidetector CT imaging of the head and cervical spine was performed following the standard protocol without intravenous contrast. Multiplanar CT image reconstructions of the cervical spine were also generated. RADIATION DOSE REDUCTION: This exam was performed according to the departmental dose-optimization program which includes automated exposure control, adjustment of the mA and/or kV according to patient size and/or use of iterative reconstruction technique. COMPARISON:  Head CT 06/12/2020, MRI cervical spine 10/20/2007. FINDINGS: CT HEAD FINDINGS Brain: There is no new asymmetry concerning for acute cortical based infarct, hemorrhage, mass or mass effect. There is no midline shift. There is progressive moderately advanced global atrophy with atrophic ventriculomegaly and progressive severe chronic small-vessel disease of the cerebral white matter. There are no old territorial or visible lacunar infarcts. Basal cisterns are clear. Vascular: There are patchy calcifications in both siphons. No hyperdense central vessel is seen. Skull:  Negative for fractures or focal lesions. No visible scalp hematoma. Sinuses/Orbits: Mild membrane thickening noted left sphenoid air cell. Otherwise clear paranasal sinuses, mastoid air cells, middle ear cavities. Midline nasal septum. Negative orbits. Other: None. CT CERVICAL SPINE FINDINGS Alignment: Physiologic apart from bone-on-bone anterior atlantodental joint space loss, with osteophytes. No listhesis or scoliosis. Skull base and vertebrae: Generalized osteopenia. No acute fracture is evident. No primary bone lesion or focal pathologic process. Soft tissues and spinal canal: No prevertebral fluid or swelling. No visible canal hematoma. There is calcification of the left-greater-than-right carotid bifurcations. No thyroid or laryngeal mass. Partial visualization noted right IJ port catheter. Disc levels: There is advanced diffuse chronic degenerative disc collapse compared with 2009, except at C2-3 which is still normal in height. There are prominent bidirectional osteophytes C3-4 through C7-T1 inclusive. Posterior disc osteophyte complexes are associated with spondylotic cord compression at C3-4, on the left-greater-than-right at C5-6, and at C6-7. There is left-greater-than-right facet joint hypertrophy and multilevel uncinate spurring and acquired foraminal stenosis, most significantly on the right at C3-4 and on the left at C4-5, C5-6, and bilaterally at C6-7 and C7-T1. Upper chest: Aortic Atherosclerosis.  Otherwise negative. Other: None. IMPRESSION: 1. No acute intracranial CT findings or depressed skull fractures. 2. Progressive global atrophy and advanced chronic small-vessel disease. 3. Osteopenia and degenerative change without evidence of cervical fractures. 4. Aortic and carotid atherosclerosis. Aortic Atherosclerosis (ICD10-I70.0). Electronically Signed   By: Almira Bar M.D.   On: 01/18/2023 20:46   DG Chest Port 1 View  Result Date: 01/18/2023 CLINICAL DATA:  Unwitnessed fall from bed to  the floor. Unable to answer questions due to chronic dementia. EXAM: PORTABLE CHEST 1 VIEW COMPARISON:  Portable chest 09/23/2022 FINDINGS: Stable right IJ port catheter placement with tip at the superior cavoatrial junction. The cardiac size is normal. The mediastinum is normally outlined. There is aortic atherosclerosis. The lungs are clear of infiltrates with chronic central bronchial thickening in keeping with bronchitis. Osteopenia and thoracic spondylosis. IMPRESSION: No active disease. Chronic bronchitic changes. Aortic atherosclerosis. Electronically Signed   By: Almira Bar M.D.   On: 01/18/2023 20:26    Procedures Procedures    Medications Ordered in ED Medications - No data to display  ED Course/ Medical Decision Making/ A&P                                 Medical Decision Making Amount and/or Complexity of Data Reviewed Labs: ordered. Radiology: ordered.   Sharniece R Brado 89 y.o. presented today for  fall. Working DDx that I considered at this time includes, but not limited to, vasovagal episode, mechanical fall, ICH, epidural/subdural hematoma, basilar skull fracture, anemia, electrolyte abnormalities, drug-induced, arrhythmia, UTI, fracture, contusion, soft tissue injury.  R/o DDx: vasovagal episode, mechanical fall, ICH, epidural/subdural hematoma, basilar skull fracture, anemia, electrolyte abnormalities, drug-induced, arrhythmia, UTI, fracture, contusion, soft tissue injury: These are considered less likely due to history of present illness, physical exam, lab/imaging findings  Review of prior external notes: 11/13/2022 care coordination  Unique Tests and My Interpretation:  CT head without contrast: No acute pathology CT cervical spine without contrast: No acute pathology CBC: Unremarkable BMP: Initially potassium was 5.4 however with repeat BMP potassium is 4.3 indicating the first was most likely hemolyzed EKG: Sinus 87 bpm, right bundle branch block, left anterior  fascicular block, no signs of ST elevation or depression UA: Unremarkable COVID: Negative  Discussion with Independent Historian:  Daughter  Discussion of Management of Tests: None  Risk: Low: based on diagnostic testing/clinical impression and treatment plan  Risk Stratification Score: None  Staffed with Schlossman, MD  Plan: On exam patient was at mental baseline with her dementia unable to provide history however he did have stable vitals.  Patient's exam was largely unremarkable as I cannot find signs of trauma.  I spoke to the daughter who is medical decision making power who states that she does want to have imaging and labs done to further assess including a urine.  These will be obtained.  I tried to reach out to patient's SNF however was unable to speak with anyone as well as sent to voicemail.  Will obtain labs and imaging and monitor patient.  Patient's labs were ultimately reassuring however there for his BMP does show a slightly elevated potassium.  This was repeated and the potassium was normal indicating the first sample is most likely hemolyzed.  At this time we do not have any signs of trauma on the physical exam, imaging has been unremarkable, and labs are reassuring.  I spoke to the daughter and daughters okay with patient being discharged and going back to her SNF.  Patient was given return precautions. Patient stable for discharge at this time.  Patient verbalized understanding of plan.  This chart was dictated using voice recognition software.  Despite best efforts to proofread,  errors can occur which can change the documentation meaning.         Final Clinical Impression(s) / ED Diagnoses Final diagnoses:  Fall, initial encounter    Rx / DC Orders ED Discharge Orders     None         Remi Deter 01/18/23 2333    Alvira Monday, MD 01/20/23 2221

## 2023-01-18 NOTE — Progress Notes (Signed)
[facility] [address] [facility]       This patient is a current hospice patient with [facility], admitted with a terminal diagnosis of Pancreatic Cancer.     [facility] will continue to follow for any discharge planning needs and to coordinate continuation of hospice care.   Please don't hesitate to call with any Hospice related questions or concerns.    Thank you for the opportunity to participate in this patient's care. Thea Gist, Charity fundraiser, BSN [facility] Liaison [phone]

## 2023-01-18 NOTE — Discharge Instructions (Signed)
Please follow-up with your primary care provider regards to recent ER visit.  Today your workup was reassuring and so you were discharged.  Please follow-up with your primary care provider to ensure you are doing well.  If symptoms change or worsen please return to ER.

## 2023-01-19 NOTE — ED Notes (Signed)
[facility] called for transport 

## 2023-01-19 NOTE — ED Notes (Signed)
Vitals not assessed at time of discharge. Pt had pulled out her IV and pulled off all monitoring equipment

## 2023-02-05 ENCOUNTER — Encounter: Payer: Self-pay | Admitting: Family

## 2023-02-05 ENCOUNTER — Encounter: Payer: Self-pay | Admitting: Hematology & Oncology

## 2023-02-10 ENCOUNTER — Telehealth: Payer: Self-pay | Admitting: Family

## 2023-02-10 NOTE — Telephone Encounter (Signed)
Pt's daughter reached out to me to let me know that her mother passed on 22-Feb-2023.    Kim, can you please change pt's status to deceased?

## 2023-02-14 DEATH — deceased
